# Patient Record
Sex: Female | Born: 1935 | Race: Black or African American | Hispanic: No | Marital: Married | Smoking: Never smoker
Health system: Southern US, Community
[De-identification: ages and names within clinical notes are randomized; demographics above are authoritative.]

## PROBLEM LIST (undated history)

## (undated) DIAGNOSIS — D126 Benign neoplasm of colon, unspecified: Secondary | ICD-10-CM

## (undated) DIAGNOSIS — I639 Cerebral infarction, unspecified: Secondary | ICD-10-CM

## (undated) DIAGNOSIS — F419 Anxiety disorder, unspecified: Secondary | ICD-10-CM

## (undated) DIAGNOSIS — I10 Essential (primary) hypertension: Secondary | ICD-10-CM

## (undated) DIAGNOSIS — E785 Hyperlipidemia, unspecified: Secondary | ICD-10-CM

## (undated) DIAGNOSIS — E119 Type 2 diabetes mellitus without complications: Secondary | ICD-10-CM

## (undated) DIAGNOSIS — K589 Irritable bowel syndrome without diarrhea: Secondary | ICD-10-CM

## (undated) DIAGNOSIS — J302 Other seasonal allergic rhinitis: Secondary | ICD-10-CM

## (undated) DIAGNOSIS — I484 Atypical atrial flutter: Secondary | ICD-10-CM

## (undated) DIAGNOSIS — H269 Unspecified cataract: Secondary | ICD-10-CM

## (undated) DIAGNOSIS — M797 Fibromyalgia: Secondary | ICD-10-CM

## (undated) DIAGNOSIS — I5032 Chronic diastolic (congestive) heart failure: Secondary | ICD-10-CM

## (undated) DIAGNOSIS — K219 Gastro-esophageal reflux disease without esophagitis: Secondary | ICD-10-CM

## (undated) DIAGNOSIS — Z9289 Personal history of other medical treatment: Secondary | ICD-10-CM

## (undated) DIAGNOSIS — M1990 Unspecified osteoarthritis, unspecified site: Secondary | ICD-10-CM

## (undated) HISTORY — DX: Cerebral infarction, unspecified: I63.9

## (undated) HISTORY — DX: Gastro-esophageal reflux disease without esophagitis: K21.9

## (undated) HISTORY — DX: Anxiety disorder, unspecified: F41.9

## (undated) HISTORY — DX: Unspecified osteoarthritis, unspecified site: M19.90

## (undated) HISTORY — PX: ANKLE FRACTURE SURGERY: SHX122

## (undated) HISTORY — DX: Irritable bowel syndrome, unspecified: K58.9

## (undated) HISTORY — PX: TUBAL LIGATION: SHX77

## (undated) HISTORY — DX: Chronic diastolic (congestive) heart failure: I50.32

## (undated) HISTORY — DX: Hyperlipidemia, unspecified: E78.5

## (undated) HISTORY — DX: Unspecified cataract: H26.9

## (undated) HISTORY — PX: CHOLECYSTECTOMY: SHX55

## (undated) HISTORY — DX: Fibromyalgia: M79.7

## (undated) HISTORY — DX: Benign neoplasm of colon, unspecified: D12.6

## (undated) HISTORY — PX: WRIST FRACTURE SURGERY: SHX121

---

## 1997-11-09 ENCOUNTER — Encounter: Admission: RE | Admit: 1997-11-09 | Discharge: 1997-11-09 | Payer: Self-pay | Admitting: Internal Medicine

## 1999-04-30 ENCOUNTER — Ambulatory Visit (HOSPITAL_COMMUNITY): Admission: RE | Admit: 1999-04-30 | Discharge: 1999-04-30 | Payer: Self-pay | Admitting: Gastroenterology

## 1999-04-30 ENCOUNTER — Encounter: Payer: Self-pay | Admitting: Gastroenterology

## 1999-05-20 ENCOUNTER — Encounter: Payer: Self-pay | Admitting: Gastroenterology

## 1999-05-20 ENCOUNTER — Ambulatory Visit (HOSPITAL_COMMUNITY): Admission: RE | Admit: 1999-05-20 | Discharge: 1999-05-20 | Payer: Self-pay | Admitting: Gastroenterology

## 1999-05-27 ENCOUNTER — Ambulatory Visit (HOSPITAL_COMMUNITY): Admission: RE | Admit: 1999-05-27 | Discharge: 1999-05-27 | Payer: Self-pay | Admitting: Gastroenterology

## 1999-05-27 ENCOUNTER — Encounter: Payer: Self-pay | Admitting: Gastroenterology

## 1999-06-27 ENCOUNTER — Ambulatory Visit (HOSPITAL_COMMUNITY): Admission: RE | Admit: 1999-06-27 | Discharge: 1999-06-27 | Payer: Self-pay | Admitting: Obstetrics and Gynecology

## 1999-06-27 ENCOUNTER — Encounter: Payer: Self-pay | Admitting: Obstetrics and Gynecology

## 2000-03-25 ENCOUNTER — Ambulatory Visit (HOSPITAL_COMMUNITY): Admission: RE | Admit: 2000-03-25 | Discharge: 2000-03-25 | Payer: Self-pay | Admitting: Gastroenterology

## 2000-03-25 ENCOUNTER — Encounter: Payer: Self-pay | Admitting: Gastroenterology

## 2000-04-07 ENCOUNTER — Encounter: Payer: Self-pay | Admitting: Gastroenterology

## 2000-04-07 ENCOUNTER — Ambulatory Visit (HOSPITAL_COMMUNITY): Admission: RE | Admit: 2000-04-07 | Discharge: 2000-04-07 | Payer: Self-pay | Admitting: Gastroenterology

## 2000-08-30 ENCOUNTER — Encounter: Payer: Self-pay | Admitting: Gastroenterology

## 2000-08-30 ENCOUNTER — Ambulatory Visit (HOSPITAL_COMMUNITY): Admission: RE | Admit: 2000-08-30 | Discharge: 2000-08-30 | Payer: Self-pay | Admitting: Gastroenterology

## 2000-10-08 ENCOUNTER — Ambulatory Visit (HOSPITAL_COMMUNITY): Admission: RE | Admit: 2000-10-08 | Discharge: 2000-10-09 | Payer: Self-pay | Admitting: Cardiology

## 2004-05-14 ENCOUNTER — Ambulatory Visit: Payer: Self-pay | Admitting: Gastroenterology

## 2004-06-19 ENCOUNTER — Ambulatory Visit: Payer: Self-pay | Admitting: Gastroenterology

## 2004-06-26 ENCOUNTER — Ambulatory Visit: Payer: Self-pay | Admitting: Gastroenterology

## 2004-06-26 ENCOUNTER — Ambulatory Visit (HOSPITAL_COMMUNITY): Admission: RE | Admit: 2004-06-26 | Discharge: 2004-06-26 | Payer: Self-pay | Admitting: Gastroenterology

## 2004-10-20 ENCOUNTER — Ambulatory Visit: Payer: Self-pay | Admitting: Internal Medicine

## 2004-12-18 ENCOUNTER — Ambulatory Visit: Payer: Self-pay | Admitting: Internal Medicine

## 2004-12-19 ENCOUNTER — Ambulatory Visit: Payer: Self-pay | Admitting: Internal Medicine

## 2004-12-22 ENCOUNTER — Ambulatory Visit: Payer: Self-pay | Admitting: Internal Medicine

## 2004-12-25 ENCOUNTER — Ambulatory Visit: Payer: Self-pay | Admitting: Internal Medicine

## 2004-12-29 ENCOUNTER — Ambulatory Visit: Payer: Self-pay | Admitting: Internal Medicine

## 2005-01-02 ENCOUNTER — Ambulatory Visit: Payer: Self-pay | Admitting: Internal Medicine

## 2005-01-05 ENCOUNTER — Ambulatory Visit: Payer: Self-pay | Admitting: Internal Medicine

## 2005-01-08 ENCOUNTER — Ambulatory Visit: Payer: Self-pay | Admitting: Internal Medicine

## 2005-01-12 ENCOUNTER — Ambulatory Visit: Payer: Self-pay | Admitting: Internal Medicine

## 2005-01-15 ENCOUNTER — Ambulatory Visit: Payer: Self-pay | Admitting: Internal Medicine

## 2005-01-19 ENCOUNTER — Ambulatory Visit: Payer: Self-pay | Admitting: Internal Medicine

## 2005-01-27 ENCOUNTER — Ambulatory Visit: Payer: Self-pay | Admitting: Internal Medicine

## 2005-02-05 ENCOUNTER — Ambulatory Visit: Payer: Self-pay | Admitting: Internal Medicine

## 2005-02-10 ENCOUNTER — Ambulatory Visit: Payer: Self-pay | Admitting: Internal Medicine

## 2005-04-16 ENCOUNTER — Ambulatory Visit: Payer: Self-pay | Admitting: Internal Medicine

## 2005-05-20 ENCOUNTER — Ambulatory Visit: Payer: Self-pay | Admitting: Gastroenterology

## 2005-05-25 ENCOUNTER — Ambulatory Visit (HOSPITAL_COMMUNITY): Admission: RE | Admit: 2005-05-25 | Discharge: 2005-05-25 | Payer: Self-pay | Admitting: Gastroenterology

## 2005-06-25 ENCOUNTER — Ambulatory Visit: Payer: Self-pay | Admitting: Gastroenterology

## 2005-08-21 ENCOUNTER — Ambulatory Visit: Payer: Self-pay | Admitting: Internal Medicine

## 2005-12-23 ENCOUNTER — Ambulatory Visit: Payer: Self-pay | Admitting: Gastroenterology

## 2006-03-25 ENCOUNTER — Ambulatory Visit: Payer: Self-pay | Admitting: Cardiology

## 2006-03-26 ENCOUNTER — Ambulatory Visit: Payer: Self-pay

## 2006-03-26 ENCOUNTER — Encounter: Payer: Self-pay | Admitting: Cardiology

## 2006-04-05 ENCOUNTER — Ambulatory Visit: Payer: Self-pay | Admitting: Internal Medicine

## 2006-07-14 ENCOUNTER — Ambulatory Visit: Payer: Self-pay | Admitting: Gastroenterology

## 2006-09-16 ENCOUNTER — Ambulatory Visit: Payer: Self-pay | Admitting: Internal Medicine

## 2006-09-17 ENCOUNTER — Ambulatory Visit: Payer: Self-pay | Admitting: Internal Medicine

## 2006-10-07 ENCOUNTER — Ambulatory Visit: Payer: Self-pay | Admitting: Cardiology

## 2006-11-01 ENCOUNTER — Ambulatory Visit: Payer: Self-pay | Admitting: Gastroenterology

## 2007-02-07 ENCOUNTER — Ambulatory Visit: Payer: Self-pay | Admitting: Gastroenterology

## 2007-02-07 LAB — CONVERTED CEMR LAB
ALT: 16 units/L (ref 0–35)
AST: 21 units/L (ref 0–37)
Albumin: 3.7 g/dL (ref 3.5–5.2)
Alkaline Phosphatase: 70 units/L (ref 39–117)
Amylase: 162 units/L — ABNORMAL HIGH (ref 27–131)
BUN: 12 mg/dL (ref 6–23)
Basophils Absolute: 0.3 10*3/uL — ABNORMAL HIGH (ref 0.0–0.1)
Basophils Relative: 3.4 % — ABNORMAL HIGH (ref 0.0–1.0)
Bilirubin, Direct: 0.1 mg/dL (ref 0.0–0.3)
CO2: 29 meq/L (ref 19–32)
Calcium: 9.1 mg/dL (ref 8.4–10.5)
Chloride: 107 meq/L (ref 96–112)
Creatinine, Ser: 0.9 mg/dL (ref 0.4–1.2)
Eosinophils Absolute: 0.3 10*3/uL (ref 0.0–0.6)
Eosinophils Relative: 3.2 % (ref 0.0–5.0)
Ferritin: 43.4 ng/mL (ref 10.0–291.0)
Folate: 6.4 ng/mL
GFR calc Af Amer: 80 mL/min
GFR calc non Af Amer: 66 mL/min
Glucose, Bld: 99 mg/dL (ref 70–99)
HCT: 35.3 % — ABNORMAL LOW (ref 36.0–46.0)
Hemoglobin: 11.9 g/dL — ABNORMAL LOW (ref 12.0–15.0)
Iron: 38 ug/dL — ABNORMAL LOW (ref 42–145)
Lipase: 39 units/L (ref 11.0–59.0)
Lymphocytes Relative: 42.2 % (ref 12.0–46.0)
MCHC: 33.7 g/dL (ref 30.0–36.0)
MCV: 83.4 fL (ref 78.0–100.0)
Monocytes Absolute: 0.6 10*3/uL (ref 0.2–0.7)
Monocytes Relative: 7.6 % (ref 3.0–11.0)
Neutro Abs: 3.6 10*3/uL (ref 1.4–7.7)
Neutrophils Relative %: 43.6 % (ref 43.0–77.0)
Platelets: 278 10*3/uL (ref 150–400)
Potassium: 4.3 meq/L (ref 3.5–5.1)
RBC: 4.23 M/uL (ref 3.87–5.11)
RDW: 14.4 % (ref 11.5–14.6)
Saturation Ratios: 12.7 % — ABNORMAL LOW (ref 20.0–50.0)
Sodium: 141 meq/L (ref 135–145)
TSH: 1.28 microintl units/mL (ref 0.35–5.50)
Total Bilirubin: 0.6 mg/dL (ref 0.3–1.2)
Total Protein: 6.9 g/dL (ref 6.0–8.3)
Transferrin: 213.8 mg/dL (ref >=212.0)
Vitamin B-12: 283 pg/mL (ref 211–911)
WBC: 8.3 10*3/uL (ref 4.5–10.5)

## 2007-02-18 ENCOUNTER — Ambulatory Visit: Payer: Self-pay | Admitting: Gastroenterology

## 2007-04-04 DIAGNOSIS — J45909 Unspecified asthma, uncomplicated: Secondary | ICD-10-CM | POA: Insufficient documentation

## 2007-04-04 DIAGNOSIS — K2100 Gastro-esophageal reflux disease with esophagitis, without bleeding: Secondary | ICD-10-CM | POA: Insufficient documentation

## 2007-04-04 DIAGNOSIS — J309 Allergic rhinitis, unspecified: Secondary | ICD-10-CM | POA: Insufficient documentation

## 2007-04-04 DIAGNOSIS — H6980 Other specified disorders of Eustachian tube, unspecified ear: Secondary | ICD-10-CM | POA: Insufficient documentation

## 2007-04-06 ENCOUNTER — Ambulatory Visit: Payer: Self-pay | Admitting: Internal Medicine

## 2007-05-06 ENCOUNTER — Ambulatory Visit: Payer: Self-pay | Admitting: Gastroenterology

## 2007-05-06 LAB — CONVERTED CEMR LAB
ALT: 18 units/L (ref 0–35)
AST: 24 units/L (ref 0–37)
Albumin: 3.8 g/dL (ref 3.5–5.2)
Alkaline Phosphatase: 74 units/L (ref 39–117)
BUN: 7 mg/dL (ref 6–23)
Basophils Absolute: 0 10*3/uL (ref 0.0–0.1)
Basophils Relative: 0.4 % (ref 0.0–1.0)
Bilirubin, Direct: 0.1 mg/dL (ref 0.0–0.3)
CA 125: 9.3 units/mL (ref 0.0–30.2)
CO2: 29 meq/L (ref 19–32)
Calcium: 9.6 mg/dL (ref 8.4–10.5)
Chloride: 107 meq/L (ref 96–112)
Creatinine, Ser: 0.8 mg/dL (ref 0.4–1.2)
Eosinophils Absolute: 0.2 10*3/uL (ref 0.0–0.6)
Eosinophils Relative: 2.6 % (ref 0.0–5.0)
Ferritin: 55 ng/mL (ref 10.0–291.0)
Folate: 10.5 ng/mL
GFR calc Af Amer: 91 mL/min
GFR calc non Af Amer: 75 mL/min
Glucose, Bld: 112 mg/dL — ABNORMAL HIGH (ref 70–99)
HCT: 38.6 % (ref 36.0–46.0)
Hemoglobin: 13.1 g/dL (ref 12.0–15.0)
Iron: 51 ug/dL (ref 42–145)
Lymphocytes Relative: 41.5 % (ref 12.0–46.0)
MCHC: 33.8 g/dL (ref 30.0–36.0)
MCV: 84.9 fL (ref 78.0–100.0)
Monocytes Absolute: 0.6 10*3/uL (ref 0.2–0.7)
Monocytes Relative: 7.8 % (ref 3.0–11.0)
Neutro Abs: 3.6 10*3/uL (ref 1.4–7.7)
Neutrophils Relative %: 47.7 % (ref 43.0–77.0)
Platelets: 276 10*3/uL (ref 150–400)
Potassium: 4.2 meq/L (ref 3.5–5.1)
RBC: 4.55 M/uL (ref 3.87–5.11)
RDW: 13.6 % (ref 11.5–14.6)
Saturation Ratios: 16.6 % — ABNORMAL LOW (ref 20.0–50.0)
Sed Rate: 18 mm/hr (ref 0–25)
Sodium: 141 meq/L (ref 135–145)
TSH: 1.86 microintl units/mL (ref 0.35–5.50)
Total Bilirubin: 0.6 mg/dL (ref 0.3–1.2)
Total Protein: 7.3 g/dL (ref 6.0–8.3)
Transferrin: 219.2 mg/dL (ref >=212.0)
Vitamin B-12: 263 pg/mL (ref 211–911)
WBC: 7.6 10*3/uL (ref 4.5–10.5)

## 2007-05-09 ENCOUNTER — Ambulatory Visit: Payer: Self-pay | Admitting: Cardiology

## 2007-06-04 ENCOUNTER — Emergency Department (HOSPITAL_COMMUNITY): Admission: EM | Admit: 2007-06-04 | Discharge: 2007-06-04 | Payer: Self-pay | Admitting: Emergency Medicine

## 2007-06-14 ENCOUNTER — Ambulatory Visit: Payer: Self-pay | Admitting: Gastroenterology

## 2007-09-05 ENCOUNTER — Ambulatory Visit: Payer: Self-pay | Admitting: Cardiology

## 2007-09-14 ENCOUNTER — Encounter: Payer: Self-pay | Admitting: Cardiology

## 2007-09-14 ENCOUNTER — Ambulatory Visit: Payer: Self-pay

## 2007-11-02 ENCOUNTER — Encounter: Payer: Self-pay | Admitting: Gastroenterology

## 2007-12-05 ENCOUNTER — Ambulatory Visit: Payer: Self-pay | Admitting: Internal Medicine

## 2007-12-12 ENCOUNTER — Encounter: Payer: Self-pay | Admitting: Internal Medicine

## 2007-12-28 ENCOUNTER — Ambulatory Visit: Payer: Self-pay | Admitting: Internal Medicine

## 2008-04-05 ENCOUNTER — Ambulatory Visit: Payer: Self-pay | Admitting: Internal Medicine

## 2008-04-20 ENCOUNTER — Telehealth: Payer: Self-pay | Admitting: Gastroenterology

## 2008-05-08 ENCOUNTER — Telehealth: Payer: Self-pay | Admitting: Internal Medicine

## 2008-05-22 ENCOUNTER — Ambulatory Visit: Payer: Self-pay | Admitting: Internal Medicine

## 2008-05-30 ENCOUNTER — Telehealth (INDEPENDENT_AMBULATORY_CARE_PROVIDER_SITE_OTHER): Payer: Self-pay | Admitting: *Deleted

## 2008-09-24 ENCOUNTER — Ambulatory Visit: Payer: Self-pay | Admitting: Internal Medicine

## 2009-02-15 ENCOUNTER — Encounter: Admission: RE | Admit: 2009-02-15 | Discharge: 2009-02-15 | Payer: Self-pay | Admitting: Obstetrics and Gynecology

## 2009-03-25 ENCOUNTER — Encounter: Payer: Self-pay | Admitting: Cardiology

## 2009-03-26 ENCOUNTER — Ambulatory Visit: Payer: Self-pay | Admitting: Internal Medicine

## 2009-07-03 DIAGNOSIS — I10 Essential (primary) hypertension: Secondary | ICD-10-CM | POA: Insufficient documentation

## 2009-07-03 DIAGNOSIS — E1122 Type 2 diabetes mellitus with diabetic chronic kidney disease: Secondary | ICD-10-CM | POA: Insufficient documentation

## 2009-07-03 DIAGNOSIS — K3184 Gastroparesis: Secondary | ICD-10-CM | POA: Insufficient documentation

## 2009-07-03 DIAGNOSIS — E782 Mixed hyperlipidemia: Secondary | ICD-10-CM | POA: Insufficient documentation

## 2009-07-03 DIAGNOSIS — Z87898 Personal history of other specified conditions: Secondary | ICD-10-CM | POA: Insufficient documentation

## 2009-07-03 DIAGNOSIS — K5731 Diverticulosis of large intestine without perforation or abscess with bleeding: Secondary | ICD-10-CM | POA: Insufficient documentation

## 2009-07-03 DIAGNOSIS — K589 Irritable bowel syndrome without diarrhea: Secondary | ICD-10-CM | POA: Insufficient documentation

## 2009-07-03 DIAGNOSIS — E785 Hyperlipidemia, unspecified: Secondary | ICD-10-CM

## 2009-07-03 DIAGNOSIS — K5900 Constipation, unspecified: Secondary | ICD-10-CM | POA: Insufficient documentation

## 2009-07-03 DIAGNOSIS — N183 Chronic kidney disease, stage 3 (moderate): Secondary | ICD-10-CM

## 2009-07-03 DIAGNOSIS — I080 Rheumatic disorders of both mitral and aortic valves: Secondary | ICD-10-CM | POA: Insufficient documentation

## 2009-07-03 DIAGNOSIS — M129 Arthropathy, unspecified: Secondary | ICD-10-CM | POA: Insufficient documentation

## 2009-07-04 ENCOUNTER — Ambulatory Visit: Payer: Self-pay | Admitting: Cardiology

## 2009-07-04 ENCOUNTER — Telehealth (INDEPENDENT_AMBULATORY_CARE_PROVIDER_SITE_OTHER): Payer: Self-pay | Admitting: *Deleted

## 2009-07-04 DIAGNOSIS — R209 Unspecified disturbances of skin sensation: Secondary | ICD-10-CM | POA: Insufficient documentation

## 2009-07-04 DIAGNOSIS — I2789 Other specified pulmonary heart diseases: Secondary | ICD-10-CM | POA: Insufficient documentation

## 2009-08-12 ENCOUNTER — Ambulatory Visit: Payer: Self-pay | Admitting: Cardiovascular Disease

## 2009-08-12 ENCOUNTER — Ambulatory Visit: Payer: Self-pay | Admitting: Cardiology

## 2009-08-12 ENCOUNTER — Ambulatory Visit: Payer: Self-pay

## 2009-08-12 ENCOUNTER — Ambulatory Visit (HOSPITAL_COMMUNITY): Admission: RE | Admit: 2009-08-12 | Discharge: 2009-08-12 | Payer: Self-pay | Admitting: Cardiology

## 2009-08-12 ENCOUNTER — Encounter: Payer: Self-pay | Admitting: Cardiology

## 2009-09-18 ENCOUNTER — Encounter: Payer: Self-pay | Admitting: Internal Medicine

## 2009-10-03 ENCOUNTER — Encounter: Payer: Self-pay | Admitting: Gastroenterology

## 2009-10-18 ENCOUNTER — Encounter: Admission: RE | Admit: 2009-10-18 | Discharge: 2009-10-18 | Payer: Self-pay | Admitting: Family Medicine

## 2009-11-06 ENCOUNTER — Ambulatory Visit: Payer: Self-pay | Admitting: Cardiology

## 2009-11-06 DIAGNOSIS — I5032 Chronic diastolic (congestive) heart failure: Secondary | ICD-10-CM | POA: Insufficient documentation

## 2010-02-18 ENCOUNTER — Encounter: Payer: Self-pay | Admitting: Cardiology

## 2010-02-25 ENCOUNTER — Ambulatory Visit: Payer: Self-pay | Admitting: Cardiology

## 2010-02-25 ENCOUNTER — Ambulatory Visit (HOSPITAL_COMMUNITY): Admission: RE | Admit: 2010-02-25 | Discharge: 2010-02-25 | Payer: Self-pay | Admitting: Cardiology

## 2010-02-25 ENCOUNTER — Encounter: Payer: Self-pay | Admitting: Cardiology

## 2010-02-25 ENCOUNTER — Ambulatory Visit: Payer: Self-pay

## 2010-03-03 ENCOUNTER — Encounter: Payer: Self-pay | Admitting: Cardiovascular Disease

## 2010-03-03 ENCOUNTER — Ambulatory Visit: Payer: Self-pay | Admitting: Cardiology

## 2010-03-03 ENCOUNTER — Encounter: Payer: Self-pay | Admitting: Cardiology

## 2010-03-10 ENCOUNTER — Ambulatory Visit: Payer: Self-pay | Admitting: Cardiology

## 2010-03-10 ENCOUNTER — Ambulatory Visit (HOSPITAL_COMMUNITY): Admission: RE | Admit: 2010-03-10 | Discharge: 2010-03-10 | Payer: Self-pay | Admitting: Cardiology

## 2010-03-25 ENCOUNTER — Ambulatory Visit: Payer: Self-pay | Admitting: Cardiology

## 2010-05-27 ENCOUNTER — Ambulatory Visit: Payer: Self-pay | Admitting: Cardiology

## 2010-06-23 ENCOUNTER — Telehealth: Payer: Self-pay | Admitting: Cardiology

## 2010-06-23 ENCOUNTER — Ambulatory Visit (HOSPITAL_COMMUNITY)
Admission: RE | Admit: 2010-06-23 | Discharge: 2010-06-23 | Payer: Self-pay | Source: Home / Self Care | Attending: Cardiology | Admitting: Cardiology

## 2010-06-30 LAB — GLUCOSE, CAPILLARY
Glucose-Capillary: 104 mg/dL — ABNORMAL HIGH (ref 70–99)
Glucose-Capillary: 106 mg/dL — ABNORMAL HIGH (ref 70–99)

## 2010-07-07 ENCOUNTER — Other Ambulatory Visit: Payer: Self-pay | Admitting: Cardiology

## 2010-07-07 ENCOUNTER — Ambulatory Visit
Admission: RE | Admit: 2010-07-07 | Discharge: 2010-07-07 | Payer: Self-pay | Source: Home / Self Care | Attending: Cardiology | Admitting: Cardiology

## 2010-07-07 ENCOUNTER — Encounter: Payer: Self-pay | Admitting: Cardiology

## 2010-07-07 LAB — MAGNESIUM: Magnesium: 1.8 mg/dL (ref 1.5–2.5)

## 2010-07-07 LAB — BASIC METABOLIC PANEL
BUN: 15 mg/dL (ref 6–23)
CO2: 28 mEq/L (ref 19–32)
Calcium: 9.6 mg/dL (ref 8.4–10.5)
Chloride: 105 mEq/L (ref 96–112)
Creatinine, Ser: 1 mg/dL (ref 0.4–1.2)
GFR: 72.13 mL/min (ref >60.00)
Glucose, Bld: 97 mg/dL (ref 70–99)
Potassium: 4.2 mEq/L (ref 3.5–5.1)
Sodium: 139 mEq/L (ref 135–145)

## 2010-07-13 LAB — CONVERTED CEMR LAB
BUN: 10 mg/dL (ref 6–23)
BUN: 14 mg/dL (ref 6–23)
Basophils Absolute: 0 10*3/uL (ref 0.0–0.1)
Basophils Relative: 0.4 % (ref 0.0–3.0)
CO2: 28 meq/L (ref 19–32)
CO2: 30 meq/L (ref 19–32)
Calcium: 10 mg/dL (ref 8.4–10.5)
Calcium: 9.9 mg/dL (ref 8.4–10.5)
Chloride: 103 meq/L (ref 96–112)
Chloride: 106 meq/L (ref 96–112)
Creatinine, Ser: 0.7 mg/dL (ref 0.4–1.2)
Creatinine, Ser: 1 mg/dL (ref 0.4–1.2)
Eosinophils Absolute: 0.5 10*3/uL (ref 0.0–0.7)
Eosinophils Relative: 5 % (ref 0.0–5.0)
GFR calc non Af Amer: 100.32 mL/min (ref >60)
GFR calc non Af Amer: 69.71 mL/min (ref >60)
Glucose, Bld: 103 mg/dL — ABNORMAL HIGH (ref 70–99)
Glucose, Bld: 120 mg/dL — ABNORMAL HIGH (ref 70–99)
HCT: 37.7 % (ref 36.0–46.0)
Hemoglobin: 12.6 g/dL (ref 12.0–15.0)
INR: 1 (ref 0.8–1.0)
Lymphocytes Relative: 40 % (ref 12.0–46.0)
Lymphs Abs: 4 10*3/uL (ref 0.7–4.0)
MCHC: 33.5 g/dL (ref 30.0–36.0)
MCV: 83.9 fL (ref 78.0–100.0)
Monocytes Absolute: 0.8 10*3/uL (ref 0.1–1.0)
Monocytes Relative: 8.1 % (ref 3.0–12.0)
Neutro Abs: 4.7 10*3/uL (ref 1.4–7.7)
Neutrophils Relative %: 46.5 % (ref 43.0–77.0)
Platelets: 271 10*3/uL (ref 150.0–400.0)
Potassium: 4 meq/L (ref 3.5–5.1)
Potassium: 4.4 meq/L (ref 3.5–5.1)
Prothrombin Time: 10.8 s (ref 9.7–11.8)
RBC: 4.5 M/uL (ref 3.87–5.11)
RDW: 15.7 % — ABNORMAL HIGH (ref 11.5–14.6)
Sodium: 140 meq/L (ref 135–145)
Sodium: 144 meq/L (ref 135–145)
WBC: 10 10*3/uL (ref 4.5–10.5)

## 2010-07-17 NOTE — Assessment & Plan Note (Signed)
Summary: 6 MONTHS/ MBW   Primary Provider/Referring Provider:  Dr. Fuller Mandril  CC:  6 month follow up visit-nasal drainage/chest tightness/congestion.Marland Kitchen  History of Present Illness:  04/05/08- Saw Dr. Annalee Genta in June:" Eustachian dysfunction, allergic rhinitis, reflux". She stayed in during ragweed season. Nasal congestion.Added Flonase.  Reviewed PFT 12/28/07- FEV1 2.06/109%, FEV1/FVC 0.87 with minimal response to BD. DLCO-74%.  05/22/08- allergic rhinitis, asthma, eustachian dysfunction For skin testing today, off antihistamines complains ears and skin itch.  Skin Test: Mild POS reactions to grass, weed and tree pollens  09/23/08-  allergic rhinitis, asthma, eustachian dysfunction Not feeling well- blames pollen for itching of eyes and nose, with itching throat interfering with singing in choir. Denies headache, fever, sore throat, swollen glands and wheeze. C/O tired, fatigued, chest congested,  coughing thick white but blowing yellow from nose.  March 26, 2009-  Allergic rhinitis, asthma, eustachian dysfunction Blames the Fall pollen for 1 month of nasal drainage, chest tightness, congestion. We discussed the seasonal pattern of exacerbation each Spring and Fall. Cough and nasal drainage yellow. Not controlled by Claritin. Denies fever, purulent. Feels pressure- maxillary. Ears itch.  No pneumovax in past 10 years.    Current Medications (verified): 1)  Lipitor 20 Mg  Tabs (Atorvastatin Calcium) .Marland Kitchen.. 1 By Mouth Daily 2)  Diovan Hct 160-12.5 Mg  Tabs (Valsartan-Hydrochlorothiazide) .Marland Kitchen.. 1 By Mouth Daily 3)  Amitiza 24 Mcg  Caps (Lubiprostone) .... Bid 4)  Fluticasone Propionate 50 Mcg/act Susp (Fluticasone Propionate) .... 2 Puffs Each Nostril Daily 5)  Advair Diskus 100-50 Mcg/dose  Misc (Fluticasone-Salmeterol) .Marland Kitchen.. 1 Puff Two Times A Day 6)  Nexium 40 Mg  Cpdr (Esomeprazole Magnesium) .Marland Kitchen.. 1 Capsule Each Day 30 Minutes Before Meal 7)  Bayer Aspirin Ec Low Dose 81 Mg  Tbec  (Aspirin) .Marland Kitchen.. 1 By Mouth Daily 8)  Loratadine 10 Mg Tabs (Loratadine) .Marland Kitchen.. 1 Daily As Needed- Antihistamine  Allergies (verified): 1)  ! Pcn 2)  ! Cipro 3)  ! * Eggs 4)  Amoxicillin (Amoxicillin)  Past History:  Past Medical History: Last updated: 05/22/2008 Allergic rhinitis - Skin test 05/22/08 Asthma Constipation GERD Esophageal stricture Irritable Bowel Syndrome Diverticulosis MIgraines Diabetes Gastroparesis  Past Surgical History: Last updated: 09/24/2008 Cholecystectomy lens implant left eye  Family History: Last updated: 12/09/07 Mother deceased age 68 of stroke. Father deceased age 2 of stroke. 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 No ETOH use.  Risk Factors: Smoking Status: quit (04/04/2007)  Review of Systems      See HPI       The patient complains of shortness of breath with activity, shortness of breath at rest, productive cough, and non-productive cough.  The patient denies coughing up blood, chest pain, irregular heartbeats, acid heartburn, indigestion, loss of appetite, weight change, abdominal pain, difficulty swallowing, sore throat, tooth/dental problems, headaches, nasal congestion/difficulty breathing through nose, and sneezing.    Vital Signs:  Patient profile:   77 year old female Height:      62.5 inches Weight:      158.13 pounds BMI:     28.56 O2 Sat:      97 % on Room air Pulse rate:   86 / minute BP sitting:   142 / 64  (right arm) Cuff size:   regular  Vitals Entered By: Reynaldo Minium CMA (March 26, 2009 10:10 AM)  O2 Flow:  Room air  Physical Exam  Additional Exam:  General: A/Ox3; pleasant  and cooperative, NAD, tired appearing SKIN: no rash, lesions NODES: no lymphadenopathy HEENT: [place]/AT, EOM- WNL, Conjuctivae- clear, PERRLA, TM-WNL, Nose- clear, Throat-reddened, no drainage, melampatti II NECK: Supple w/ fair ROM, JVD-  none, normal carotid impulses w/o bruits Thyroid-  CHEST: Clear to P&A HEART: RRR, no m/g/r heard ABDOMEN: Soft and nl;  ZOX:WRUE, nl pulses, no edema  NEURO: Grossly intact to observation      Impression & Recommendations:  Problem # 1:  ALLERGIC RHINITIS (ICD-477.9)  Seasonal exacerbation. We can treat for today with neb and depo.We discussed restarting allergy shots- she is interested in doing this saying she felt better on shots in the past. Her updated medication list for this problem includes:    Fluticasone Propionate 50 Mcg/act Susp (Fluticasone propionate) .Marland Kitchen... 2 puffs each nostril daily    Loratadine 10 Mg Tabs (Loratadine) .Marland Kitchen... 1 daily as needed- antihistamine  Problem # 2:  ASTHMA (ICD-493.90) She is well controlled and will continue with Advair.  Other Orders: Est. Patient Level III (45409) Admin of Therapeutic Inj  intramuscular or subcutaneous (81191) Depo- Medrol 80mg  (J1040) Nebulizer Tx (47829) Pneumococcal Vaccine (56213) Admin 1st Vaccine (08657)  Patient Instructions: 1)  Please schedule a follow-up appointment in 2 months. 2)  We will call you when your vaccine is ready to start allergy shots. 3)  Neb neo nasal 4)  depo 80 5)  Pneumovax Prescriptions: FLUTICASONE PROPIONATE 50 MCG/ACT SUSP (FLUTICASONE PROPIONATE) 2 puffs each nostril daily  #1 x prn   Entered and Authorized by:   Waymon Budge MD   Signed by:   Waymon Budge MD on 03/26/2009   Method used:   Electronically to        [facility] (retail)       [address]       [place], [place]  [address]       Ph: [phone] or [phone]       Fax: [phone]   RxID:   [id number] ADVAIR DISKUS 100-50 MCG/DOSE  MISC (FLUTICASONE-SALMETEROL) 1 puff two times a day  #1 x prn   Entered and Authorized by:   Waymon Budge MD   Signed by:   Waymon Budge MD on 03/26/2009   Method used:   Electronically to        [facility]  (retail)       [address]       [place], [place]  [address]       Ph: [phone] or [phone]       Fax: [phone]   RxID:   [id number]    Immunization History:  Influenza Immunization History:    Influenza:  historical (03/08/2009)  Immunizations Administered:  Pneumonia Vaccine:    Vaccine Type: Pneumovax (Medicare)    Site: left deltoid    Mfr: Merck    Dose: 0.5 ml    Route: IM    Given by: Reynaldo Minium CMA    Exp. Date: 05/30/2010    Lot #: [id number]    VIS given: 01/11/96 version given March 26, 2009.    Medication Administration  Injection # 1:    Medication: Depo- Medrol 80mg     Diagnosis: ALLERGIC RHINITIS (ICD-477.9)    Route: IM    Site: RUOQ gluteus    Exp Date: 11/2011    Lot #: [id number]    Mfr: Pharmacia  Patient tolerated injection without complications    Given by: Reynaldo Minium CMA (March 26, 2009 11:17 AM)  Medication # 1:    Medication: EMR miscellaneous medications    Diagnosis: ALLERGIC RHINITIS (ICD-477.9)    Dose: 3 drops    Route: intranasal    Exp Date: 11/2009    Lot #: [id number]    Mfr: Bayer    Comments: Neo-Synephrine    Patient tolerated medication without complications    Given by: Reynaldo Minium CMA (March 26, 2009 11:17 AM)  Orders Added: 1)  Est. Patient Level III [64403] 2)  Admin of Therapeutic Inj  intramuscular or subcutaneous [96372] 3)  Depo- Medrol 80mg  [J1040] 4)  Nebulizer Tx [47425] 5)  Pneumococcal Vaccine [90732] 6)  Admin 1st Vaccine [95638]

## 2010-07-17 NOTE — Assessment & Plan Note (Signed)
Summary: BLOATED...AS.   History of Present Illness Visit Type: Follow-up Visit Primary GI MD: Sheryn Bison MD FACP FAGA Primary Provider: Fuller Mandril, MD Chief Complaint: c/o bloating,generalized abdominal pain, nausea and acid reflux History of Present Illness:   This patient was not seen today since she has had all her records transferred to Dr.  Ranae Palms within the last year and has undergone gastroenterology evaluation by this physician and is under her care. Please send this note to Dr. Electa Sniff for his records.   GI Review of Systems    Reports acid reflux, bloating, and  chest pain.     Location of  Abdominal pain: generalized.    Denies abdominal pain, belching, dysphagia with liquids, dysphagia with solids, heartburn, loss of appetite, nausea, vomiting, vomiting blood, weight loss, and  weight gain.        Denies anal fissure, black tarry stools, change in bowel habit, constipation, diarrhea, diverticulosis, fecal incontinence, heme positive stool, hemorrhoids, irritable bowel syndrome, jaundice, light color stool, liver problems, rectal bleeding, and  rectal pain.    Current Medications (verified): 1)  Diovan Hct 160-12.5 Mg  Tabs (Valsartan-Hydrochlorothiazide) .Marland Kitchen.. 1 By Mouth Daily 2)  Fluticasone Propionate 50 Mcg/act Susp (Fluticasone Propionate) .... 2 Puffs Each Nostril Daily 3)  Advair Diskus 100-50 Mcg/dose  Misc (Fluticasone-Salmeterol) .Marland Kitchen.. 1 Puff Two Times A Day 4)  Bayer Aspirin Ec Low Dose 81 Mg  Tbec (Aspirin) .Marland Kitchen.. 1 By Mouth Daily 5)  Lipitor 40 Mg Tabs (Atorvastatin Calcium) .... Take One Tablet By Mouth Daily. 6)  Zyrtec Allergy 10 Mg Tabs (Cetirizine Hcl) .... Every Other Day 7)  Dexilant 60 Mg Cpdr (Dexlansoprazole) .... Take 1 Capsule By Mouth Once A Day 8)  Lyrica 75 Mg Caps (Pregabalin) .... Take 1 Capsule By Mouth Two Times A Day 9)  Meclizine Hcl 25 Mg Tabs (Meclizine Hcl) .Marland Kitchen.. 1 By Mouth Q 4 Hours As Needed 10)  Tylenol Extra Strength 500 Mg  Tabs (Acetaminophen) .Marland Kitchen.. 1 By Mouth As Needed 11)  Fish Oil 1200 Mg Caps (Omega-3 Fatty Acids) .... 2 By Mouth Every Morning 12)  Vitamin C Cr 500 Mg Cr-Tabs (Ascorbic Acid) .Marland Kitchen.. 1 By Mouth Once Daily 13)  Align  Caps (Probiotic Product) .Marland Kitchen.. 1 Capsule By Mouth Once Daily 14)  Lantus 100 Unit/ml Soln (Insulin Glargine) .... 30 Units Every Morning  Allergies (verified): 1)  ! Pcn 2)  ! Cipro 3)  ! * Eggs 4)  Amoxicillin (Amoxicillin)  Past History:  Past Medical History: Reviewed history from 07/03/2009 and no changes required. Current Problems:  MITRAL REGURGITATION, MILD (ICD-396.3) HYPERTENSION, UNSPECIFIED (ICD-401.9) HYPERLIPIDEMIA (ICD-272.4) ESOPHAGITIS, REFLUX (ICD-530.11) DIABETES MELLITUS, TYPE II (ICD-250.00) EUSTACHIAN TUBE DYSFUNCTION (ICD-381.81) ASTHMA (ICD-493.90) ALLERGIC RHINITIS (ICD-477.9) ARTHRITIS (ICD-716.90) IBS (ICD-564.1) MIGRAINES, HX OF (ICD-V13.8) DIVERTICULOSIS, COLON, WITH HEMORRHAGE (ICD-562.12) GASTROPARESIS (ICD-536.3) CONSTIPATION (ICD-564.00)  Past Surgical History: Reviewed history from 07/03/2009 and no changes required. Cholecystectomy lens implant left eye Tubal ligation wrist surgery ankle surgery  Family History: Reviewed history from 07/03/2009 and no changes required. Mother deceased age 53 of stroke. Father deceased age 13 of stroke. Stroke--sister Diabetes--brother No FH of Colon Cancer:  Social History: Reviewed history from 07/03/2009 and no changes required. 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 No ETOH use. Illicit Drug Use - no Drug Use:  no  Vital Signs:  Patient profile:   77 year old female Height:      62.5 inches Weight:      158.25 pounds BMI:  28.59 Pulse rate:   68 / minute Pulse rhythm:   irregular BP sitting:   128 / 68  (left arm)  Vitals Entered By: Milford Cage NCMA (October 03, 2009 11:17 AM)

## 2010-07-17 NOTE — Assessment & Plan Note (Signed)
Summary: f35m   Visit Type:  Follow-up Primary Provider:  Fuller Mandril, MD  CC:  no complaints.  History of Present Illness: Her breathing is pretty good.  She is able to get out and do quite a bit.  She is able to walk, and works in her flower beds.   She does not feel limited working in her flower bed.  When she gets tired she sits down and rest.  Vacuuming (wants to get a robot) causes her back to her hurt.    Current Problems (verified): 1)  Chest Pain Unspecified  (ICD-786.50) 2)  Chest Tightness-pressure-other  ([id number]) 3)  Chronic Diastolic Heart Failure  (ICD-428.32) 4)  Pulmonary Hypertension, Secondary  (ICD-416.8) 5)  Arm Numbness  (ICD-782.0) 6)  Mitral Regurgitation, Mild  (ICD-396.3) 7)  Hypertension, Unspecified  (ICD-401.9) 8)  Hyperlipidemia  (ICD-272.4) 9)  Esophagitis, Reflux  (ICD-530.11) 10)  Diabetes Mellitus, Type II  (ICD-250.00) 11)  Eustachian Tube Dysfunction  (ICD-381.81) 12)  Asthma  (ICD-493.90) 13)  Allergic Rhinitis  (ICD-477.9) 14)  Arthritis  (ICD-716.90) 15)  Ibs  (ICD-564.1) 16)  Migraines, Hx of  (ICD-V13.8) 17)  Diverticulosis, Colon, With Hemorrhage  (ICD-562.12) 18)  Gastroparesis  (ICD-536.3) 19)  Constipation  (ICD-564.00)  Current Medications (verified): 1)  Diovan Hct 160-12.5 Mg  Tabs (Valsartan-Hydrochlorothiazide) .... Take 1/2 Tablet Daily 2)  Fluticasone Propionate 50 Mcg/act Susp (Fluticasone Propionate) .... 2 Puffs Each Nostril Daily 3)  Advair Diskus 100-50 Mcg/dose  Misc (Fluticasone-Salmeterol) .Marland Kitchen.. 1 Puff Two Times A Day 4)  Bayer Aspirin Ec Low Dose 81 Mg  Tbec (Aspirin) .Marland Kitchen.. 1 By Mouth Daily 5)  Lipitor 40 Mg Tabs (Atorvastatin Calcium) .... Take One Tablet By Mouth Daily. 6)  Zyrtec Allergy 10 Mg Tabs (Cetirizine Hcl) .... Every Other Day 7)  Dexilant 60 Mg Cpdr (Dexlansoprazole) .... Take 1 Capsule By Mouth Once A Day 8)  Lyrica 75 Mg Caps (Pregabalin) .... Take 1 Capsule By Mouth Two Times A Day 9)  Meclizine  Hcl 25 Mg Tabs (Meclizine Hcl) .Marland Kitchen.. 1 By Mouth Q 4 Hours As Needed 10)  Tylenol Extra Strength 500 Mg Tabs (Acetaminophen) .Marland Kitchen.. 1 By Mouth As Needed 11)  Fish Oil 1200 Mg Caps (Omega-3 Fatty Acids) .... 2 By Mouth Every Morning 12)  Vitamin C Cr 500 Mg Cr-Tabs (Ascorbic Acid) .Marland Kitchen.. 1 By Mouth Once Daily 13)  Align  Caps (Probiotic Product) .Marland Kitchen.. 1 Capsule By Mouth Once Daily 14)  Lantus 100 Unit/ml Soln (Insulin Glargine) .... 30 Units Every Morning 15)  Methylprednisolone 4 Mg Tabs (Methylprednisolone) .... Uad 16)  Sucralfate .Marland Kitchen.. 1 By Mouth Qid  Allergies (verified): 1)  ! Pcn 2)  ! Cipro 3)  ! * Eggs 4)  Amoxicillin (Amoxicillin)  Past History:  Past Medical History: Last updated: 07/03/2009 Current Problems:  MITRAL REGURGITATION, MILD (ICD-396.3) HYPERTENSION, UNSPECIFIED (ICD-401.9) HYPERLIPIDEMIA (ICD-272.4) ESOPHAGITIS, REFLUX (ICD-530.11) DIABETES MELLITUS, TYPE II (ICD-250.00) EUSTACHIAN TUBE DYSFUNCTION (ICD-381.81) ASTHMA (ICD-493.90) ALLERGIC RHINITIS (ICD-477.9) ARTHRITIS (ICD-716.90) IBS (ICD-564.1) MIGRAINES, HX OF (ICD-V13.8) DIVERTICULOSIS, COLON, WITH HEMORRHAGE (ICD-562.12) GASTROPARESIS (ICD-536.3) CONSTIPATION (ICD-564.00)  Vital Signs:  Patient profile:   77 year old female Height:      62.5 inches Weight:      157 pounds BMI:     28.36 Pulse rate:   62 / minute Resp:     16 per minute BP sitting:   136 / 67  (right arm)  Vitals Entered By: Marrion Coy, CNA (May 27, 2010 9:46 AM)  Physical Exam  General:  Well developed, well nourished, in no acute distress. Head:  normocephalic and atraumatic Eyes:  PERRLA/EOM intact; conjunctiva and lids normal. Lungs:  Clear bilaterally to auscultation and percussion. Heart:  Normal S1 and S2.  1-2/6 apical murmur. Abdomen:  Bowel sounds positive; abdomen soft and non-tender without masses, organomegaly, or hernias noted. No hepatosplenomegaly. Pulses:  pulses normal in all 4  extremities Extremities:  No clubbing or cyanosis. Neurologic:  Alert and oriented x 3.   Echocardiogram  Procedure date:  02/25/2010  Findings:       Study Conclusions    - Left ventricle: The cavity size was normal. Wall thickness was     increased in a pattern of mild LVH. Systolic function was normal.     The estimated ejection fraction was in the range of 60% to 65%.     Wall motion was normal; there were no regional wall motion     abnormalities. Features are consistent with a pseudonormal left     ventricular filling pattern, with concomitant abnormal relaxation     and increased filling pressure (grade 2 diastolic dysfunction).     E/medial e' > 15, suggesting LV end diastolic pressure at least 20     mmHg.   - Aortic valve: There was no stenosis.   - Mitral valve: Moderate regurgitation.   - Left atrium: The atrium was moderately dilated.   - Right ventricle: The cavity size was normal. Systolic function was     normal.   - Tricuspid valve: Peak RV-RA gradient:76mm Hg (S).   - Pulmonary arteries: PA peak pressure: 56mm Hg (S).   - Inferior vena cava: The vessel was normal in size; the     respirophasic diameter changes were in the normal range (= 50%);     findings are consistent with normal central venous pressure.   Impressions:    - Normal LV size with mild LV hypertrophy. EF 60-65% with moderate     diastolic dysfunction. Normal RV size and systolic function. PA     systolic pressure 56 mmHg (moderate pulmonary hypertension).     Patient has evidence for elevated left atrial pressure, but PA     systolic pressure elevation may be out of proportion to this.  Cardiac Cath  Procedure date:  03/11/2010  Findings:       HEMODYNAMIC DATA:   1. Right atrium 10.   2. RV 54/10.   3. Pulmonary 56/22, mean 34.   4. Pulmonary capillary wedge 24.   5. LV 174/22.   6. Aortic 161/71, mean 107.   7. No aortic gradient on pullback.   8. Aortic saturation 95%.   9.  Pulmonary artery saturation 66%.   10.Superior vena cava saturation 62%.   11.Thermodilution cardiac output 4.23 liters per minute.   12.Thermodilution cardiac index 2.38 liters per minute per meter       squared.   13.Fick cardiac output 4.19 meters per minute.   14.Fick cardiac index 2.35 liters per minute per meter squared.      Cardiac Cath  Procedure date:  03/11/2010  Findings:       CONCLUSION:   1. Well-preserved overall left ventricular function.   2. Mild to no more than moderate mitral regurgitation.   3. Calcified coronary arteries without significant high-grade focal       obstruction.      DISPOSITION:  Focus on the patient's hypertension will be the main focal   point at this point in  time.  TEE might be helpful, but both by echo and   cath, it does not appear to be severe and focus on blood pressure which   seemed to be the best choice at this point in time.      Impression & Recommendations:  Problem # 1:  CHRONIC DIASTOLIC HEART FAILURE (ICD-428.32) Weather this is primary or secondary is unclear.  May be related to HBP.  However, with MR feel we should do TEE to assess valvular architecture.  LVEDP is high and there is pulmonary hypertension which is chronic.   RV not enlarged.  There is moderate MR.  All of this is chronic, and she has been cathed previously and followed by Dr. Diona Browner.  TEE explained to patient and she says she has had endo in the past.  Risks of procedure covered with her.  She understands reasoning for study.  Her updated medication list for this problem includes:    Diovan Hct 160-12.5 Mg Tabs (Valsartan-hydrochlorothiazide) .Marland Kitchen... Take 1/2 tablet daily    Bayer Aspirin Ec Low Dose 81 Mg Tbec (Aspirin) .Marland Kitchen... 1 by mouth daily  Problem # 2:  PULMONARY HYPERTENSION, SECONDARY (ICD-416.8) Question secondary to HTN or MR.  Will assess with TEE.  Problem # 3:  MITRAL REGURGITATION, MILD (ICD-396.3)  see above  Orders: Trans Esophageal  Echocardiogram (TEE)  Problem # 4:  HYPERTENSION, UNSPECIFIED (ICD-401.9) better control.   Her updated medication list for this problem includes:    Diovan Hct 160-12.5 Mg Tabs (Valsartan-hydrochlorothiazide) .Marland Kitchen... Take 1/2 tablet daily    Bayer Aspirin Ec Low Dose 81 Mg Tbec (Aspirin) .Marland Kitchen... 1 by mouth daily  Problem # 5:  HYPERLIPIDEMIA (ICD-272.4) on lipid lowering treatment.  Her updated medication list for this problem includes:    Lipitor 40 Mg Tabs (Atorvastatin calcium) .Marland Kitchen... Take one tablet by mouth daily.  Patient Instructions: 1)  Your physician has requested that you have a TEE.  During a TEE, sound waves are used to create images of your heart. It provides your doctor with information about the size and shape of your heart and how well your heart's chambers and valves are working. In this test, a transducer is attached to the end of a flexible tube that's guided down your throat and into your esophagus (the tube leading from your mouth to your stomach) to get a more detailed image of your heart. You are not awake for the procedure. Please see the instruction sheet given to you today.  For further information please visit [email or web address] 2)  Your physician recommends that you schedule a follow-up appointment in: 6 WEEKS

## 2010-07-17 NOTE — Cardiovascular Report (Signed)
Summary: Cath/Percutaneous Orders   Cath/Percutaneous Orders   Imported By: Roderic Ovens 03/14/2010 09:19:17  _____________________________________________________________________  External Attachment:    Type:   Image     Comment:   External Document

## 2010-07-17 NOTE — Assessment & Plan Note (Signed)
Summary: ROV   Visit Type:  Follow-up Primary Provider:  Fuller Mandril, MD  CC:  Post-cath.  History of Present Illness:  Patient checks her BP two to three times per week.  It has not been high at all.  Patient has not been having chest pain.  She gets a little shortness of breath, but is working on some weight loss in the interim.  I reviewed her cath information with her in detail, including the images today in the office.  She wants to get out and walk more, and she has been doing so.    Current Medications (verified): 1)  Diovan Hct 160-12.5 Mg  Tabs (Valsartan-Hydrochlorothiazide) .... Take 1/2 Tablet Daily 2)  Fluticasone Propionate 50 Mcg/act Susp (Fluticasone Propionate) .... 2 Puffs Each Nostril Daily 3)  Advair Diskus 100-50 Mcg/dose  Misc (Fluticasone-Salmeterol) .Marland Kitchen.. 1 Puff Two Times A Day 4)  Bayer Aspirin Ec Low Dose 81 Mg  Tbec (Aspirin) .Marland Kitchen.. 1 By Mouth Daily 5)  Lipitor 40 Mg Tabs (Atorvastatin Calcium) .... Take One Tablet By Mouth Daily. 6)  Zyrtec Allergy 10 Mg Tabs (Cetirizine Hcl) .... Every Other Day 7)  Dexilant 60 Mg Cpdr (Dexlansoprazole) .... Take 1 Capsule By Mouth Once A Day 8)  Lyrica 75 Mg Caps (Pregabalin) .... Take 1 Capsule By Mouth Two Times A Day 9)  Meclizine Hcl 25 Mg Tabs (Meclizine Hcl) .Marland Kitchen.. 1 By Mouth Q 4 Hours As Needed 10)  Tylenol Extra Strength 500 Mg Tabs (Acetaminophen) .Marland Kitchen.. 1 By Mouth As Needed 11)  Fish Oil 1200 Mg Caps (Omega-3 Fatty Acids) .... 2 By Mouth Every Morning 12)  Vitamin C Cr 500 Mg Cr-Tabs (Ascorbic Acid) .Marland Kitchen.. 1 By Mouth Once Daily 13)  Align  Caps (Probiotic Product) .Marland Kitchen.. 1 Capsule By Mouth Once Daily 14)  Lantus 100 Unit/ml Soln (Insulin Glargine) .... 30 Units Every Morning  Allergies: 1)  ! Pcn 2)  ! Cipro 3)  ! * Eggs 4)  Amoxicillin (Amoxicillin)  Vital Signs:  Patient profile:   77 year old female Height:      62.5 inches Weight:      162.25 pounds BMI:     29.31 Pulse rate:   64 / minute Pulse rhythm:    regular Resp:     18 per minute BP sitting:   136 / 71  (left arm) Cuff size:   large  Vitals Entered By: Vikki Ports (March 25, 2010 11:38 AM)  Physical Exam  General:  Well developed, well nourished, in no acute distress. Head:  normocephalic and atraumatic Eyes:  PERRLA/EOM intact; conjunctiva and lids normal. Lungs:  Clear bilaterally to auscultation and percussion. Heart:  PMI non displaced.  Normal S1 and S2.  Soft apcial murmur. Pulses:  pulses normal in all 4 extremities Extremities:  No clubbing or cyanosis. Neurologic:  Alert and oriented x 3.   EKG  Procedure date:  03/25/2010  Findings:      NSR. LVH with repolarization changes.    CXR  Procedure date:  03/03/2010  Findings:      IMPRESSION:   1.  Chronic lung changes as above with no acute pulmonary findings. 2.  Heart mildly enlarged without congestive heart failure. 3.  Scoliosis.   Read By:  Bernerd Limbo,  [name]     Released By:  Bernerd Limbo,  [name]   Cardiac Cath  Procedure date:  03/10/2010  Findings:      IMPRESSION:   1.  Chronic lung changes  as above with no acute pulmonary findings. 2.  Heart mildly enlarged without congestive heart failure. 3.  Scoliosis.   Read By:  Bernerd Limbo,  [name]     Released By:  Bernerd Limbo,  [name]   Cardiac Cath  Procedure date:  03/10/2010  Findings:      HEMODYNAMIC DATA: 1. Right atrium 10. 2. RV 54/10. 3. Pulmonary 56/22, mean 34. 4. Pulmonary capillary wedge 24. 5. LV 174/22. 6. Aortic 161/71, mean 107. 7. No aortic gradient on pullback. 8. Aortic saturation 95%. 9. Pulmonary artery saturation 66%. 10.Superior vena cava saturation 62%. 11.Thermodilution cardiac output 4.23 liters per minute. 12.Thermodilution cardiac index 2.38 liters per minute per meter     squared. 13.Fick cardiac output 4.19 meters per minute. 14.Fick cardiac index 2.35 liters per minute per meter squared.   ANGIOGRAPHIC DATA: 1. Ventriculography  was performed in the RAO and LAO projections.     There was some ectopy, but the overall LV systolic size appeared to     be normal.  Overall ejection fraction was vigorous with EF in     excess of 60%.  There was mild mitral regurgitation to no more than     moderate noted.  It would be classified as 1 to no more than 2+.     In the LAO projection, similar findings were noted. 2. On plain fluoroscopy, there is some moderate calcification of the     proximal coronary arteries.  Impression & Recommendations:  Problem # 1:  CHRONIC DIASTOLIC HEART FAILURE (ICD-428.32)  stable at this point.  Her updated medication list for this problem includes:    Diovan Hct 160-12.5 Mg Tabs (Valsartan-hydrochlorothiazide) .Marland Kitchen... Take 1/2 tablet daily    Bayer Aspirin Ec Low Dose 81 Mg Tbec (Aspirin) .Marland Kitchen... 1 by mouth daily  Her updated medication list for this problem includes:    Diovan Hct 160-12.5 Mg Tabs (Valsartan-hydrochlorothiazide) .Marland Kitchen... Take 1/2 tablet daily    Bayer Aspirin Ec Low Dose 81 Mg Tbec (Aspirin) .Marland Kitchen... 1 by mouth daily  Orders: EKG w/ Interpretation (93000)  Problem # 2:  PULMONARY HYPERTENSION, SECONDARY (ICD-416.8) Cath data suggests that findings likely secondary to chronic diastolic dysfunction.  Will need to control diet, and BP carefully.  Reviewed cath findings in detail. These were present previously as well.   Problem # 3:  MITRAL REGURGITATION, MILD (ICD-396.3) See results. Orders: EKG w/ Interpretation ([address]000)  Patient Instructions: 1)  Your physician recommends that you schedule a follow-up appointment in: 2 MONTHS 2)  Your physician recommends that you continue on your current medications as directed. Please refer to the Current Medication list given to you today.

## 2010-07-17 NOTE — Procedures (Signed)
Summary: Gastroenterology endo  Gastroenterology endo   Imported By: Donneta Romberg 09/20/2007 11:22:32  _____________________________________________________________________  External Attachment:    Type:   Image     Comment:   External Document

## 2010-07-17 NOTE — Procedures (Signed)
Summary: Gastroenterology colon  Gastroenterology colon   Imported By: Donneta Romberg 09/20/2007 11:23:39  _____________________________________________________________________  External Attachment:    Type:   Image     Comment:   External Document

## 2010-07-17 NOTE — Miscellaneous (Signed)
Summary: RX Prior auth Amitiza  and change PPI  Clinical Lists Changes  Medications: Changed medication from PREVACID SOLUTAB 30 MG TBDP (LANSOPRAZOLE) Take 1 tablet by mouth once a day to NEXIUM 40 MG  CPDR (ESOMEPRAZOLE MAGNESIUM) 1 capsule each day 30 minutes before meal - Signed Rx of NEXIUM 40 MG  CPDR (ESOMEPRAZOLE MAGNESIUM) 1 capsule each day 30 minutes before meal;  #30 x 12;  Signed;  Entered by: Harlow Mares CMA;  Authorized by: Mardella Layman MD FACG,FAGA;  Method used: Electronic  Amitiza 24 micrograms one by mouth two times a day with meals has been approved by phone for 3 years, pharm and pt advised. pharm says copay os six dollars.  Prescriptions: NEXIUM 40 MG  CPDR (ESOMEPRAZOLE MAGNESIUM) 1 capsule each day 30 minutes before meal  #30 x 12   Entered by:   Harlow Mares CMA   Authorized by:   Mardella Layman MD FACG,FAGA   Signed by:   Harlow Mares CMA on 11/02/2007   Method used:   Electronically sent to ...       [facility]*       [address]       [place], [place]  [address]       Ph: [phone] or [phone]       Fax: [phone]   RxID:   [id number]

## 2010-07-17 NOTE — Assessment & Plan Note (Signed)
Summary: follow up/ mbw   Visit Type:  Follow-up PCP:  Dr. Fuller Mandril  Chief Complaint:  Pt here for follow-up.  Pt c/o sinus pressure.  Meds reviewed..  History of Present Illness: Current Problems:  ESOPHAGITIS, REFLUX (ICD-530.11) DM (ICD-250.00) EUSTACHIAN TUBE DYSFUNCTION (ICD-381.81) ASTHMA (ICD-493.90) ALLERGIC RHINITIS (ICD-81.75)  77 year old woman returning for follow-up of allergic rhinitis and asthma.  Complains of sinus pressure. C/o tinnnitus, asks ENT, lost singing voice.gets episodes of itching in her ears.  Has been treated with antibiotics and told she has fluid in her ears but the antibiotics don't help.  Her primary physician did chest x-ray reported clear and she denies any lower respiratory discomfort, including cough, wheeze, chest tightness, or phlegm.  She sings iin choirs and complains that her throat seems to close.  She gets out of breath too easily while singing. Denies headache, sinus drainage, sneezing chest pain, dyspnea, n/v/d, weight loss, fever, edema.         Current Allergies: ! PCN ! CIPRO ! * EGGS AMOXICILLIN (AMOXICILLIN) AMOXICILLIN (AMOXICILLIN) PENICILLIN G POTASSIUM (PENICILLIN G POTASSIUM) PENICILLIN G POTASSIUM (PENICILLIN G POTASSIUM)  Past Medical History:    Reviewed history from 09/20/2007 and no changes required:       Allergic rhinitis       Asthma       Constipation       GERD       Esophageal stricture       Irritable Bowel Syndrome       Diverticulosis       MIgraines       Diabetes       Gastroparesis   Family History:    Mother deceased age 24 of stroke.    Father deceased age 25 of stroke.    Stroke--sister    Diabetes--brother  Social History:    Reviewed history and no changes required:       Pt is a housewife.       Never smoked.       Pt has 8 children.       No ETOH use.    Review of Systems      See HPI   Vital Signs:  Patient Profile:   77 Years Old Female Weight:      154.6  pounds O2 Sat:      98 % O2 treatment:    Room Air Pulse rate:   74 / minute BP sitting:   110 / 68  (left arm) Cuff size:   regular  Vitals Entered By: Michel Bickers CMA (December 05, 2007 9:33 AM)                 Physical Exam  GENERAL:  A/Ox3; pleasant & cooperative.NAD HEENT:  [place]/AT, EOM-wnl, PERRLA, EACs-clear, TMs-wnl, NOSE-clear, small amount of white mucus on left, THROAT-clear & wnl. NECK:  Supple w/ fair ROM; no JVD; normal carotid impulses w/o bruits; no thyromegaly or nodules palpated; no lymphadenopathy. CHEST: Clear to P&A HEART:  RRR, no m/r/g  heard ABDOMEN:  Soft & nt; nml bowel sounds; no organomegaly or masses detected. EXT: Warm bilat,  no calf pain, edema, clubbing, pulses intact Skin: no rash/lesion       Problem # 1:  ASTHMA (ICD-493.90) Will get PFT, but she seems to be feeling well now. Her updated medication list for this problem includes:    Advair Diskus 100-50 Mcg/dose Misc (Fluticasone-salmeterol) .Marland Kitchen... 1 puff two times a day   Problem # 2:  ALLERGIC RHINITIS (ICD-477.9) she is having some rhinitis, eustachian dysfunction, and laryngitis.  She is also complaining of tinnitus.we will arrange ENT referral. Her updated medication list for this problem includes:    Nasonex 50 Mcg/act Susp (Mometasone furoate) ..... Once daily each nostril   Problem # 3:  EUSTACHIAN TUBE DYSFUNCTION (ICD-381.81) Ears look good but c/o persistent discomfort and tinnitus- will ask ENT Orders: Est. Patient Level III ([address]) ENT Referral (ENT)   Medications Added to Medication List This Visit: 1)  Lipitor 20 Mg Tabs (Atorvastatin calcium) .Marland Kitchen.. 1 by mouth daily 2)  Diovan Hct 160-12.5 Mg Tabs (Valsartan-hydrochlorothiazide) .Marland Kitchen.. 1 by mouth daily 3)  Nasonex 50 Mcg/act Susp (Mometasone furoate) .... Once daily each nostril 4)  Advair Diskus 100-50 Mcg/dose Misc (Fluticasone-salmeterol) .Marland Kitchen.. 1 puff two times a day 5)  Bayer Aspirin Ec Low Dose 81 Mg Tbec (Aspirin)  .Marland Kitchen.. 1 by mouth daily   Patient Instructions: 1)  Please schedule a follow-up appointment in 2 months. 2)  See [facility] to schedule ENT visit 3)  Schedule PFT   ]

## 2010-07-17 NOTE — Assessment & Plan Note (Signed)
Summary: FOLLOW UP/ MBW   PCP:  Dr. Fuller Mandril  Chief Complaint:  follow up visit- PFT results.  History of Present Illness: Current Problems:  ESOPHAGITIS, REFLUX (ICD-530.11) DM (ICD-250.00) EUSTACHIAN TUBE DYSFUNCTION (ICD-381.81) ASTHMA (ICD-493.90) ALLERGIC RHINITIS (ICD-79.82)   77 year old woman returning for follow-up of allergic rhinitis and asthma.  Complains of sinus pressure. C/o tinnnitus, asks ENT, lost singing voice.gets episodes of itching in her ears.  Has been treated with antibiotics and told she has fluid in her ears but the antibiotics don't help.  Her primary physician did chest x-ray reported clear and she denies any lower respiratory discomfort, including cough, wheeze, chest tightness, or phlegm.  She sings iin choirs and complains that her throat seems to close.  She gets out of breath too easily while singing.  04/05/08- Saw Dr. Annalee Genta in June:" Eustachian dysfunction, allergic rhinitis, reflux". She stayed in during ragweed season. Nasal congestion.Added Flonase.  Reviewed PFT 12/28/07- FEV1 2.06/109%, FEV1/FVC 0.87 with minimal response to BD. DLCO-74%.         Prior Medication List:  LIPITOR 20 MG  TABS (ATORVASTATIN CALCIUM) 1 by mouth daily DIOVAN HCT 160-12.5 MG  TABS (VALSARTAN-HYDROCHLOROTHIAZIDE) 1 by mouth daily AMITIZA 24 MCG  CAPS (LUBIPROSTONE) BID NASONEX 50 MCG/ACT  SUSP (MOMETASONE FUROATE) once daily each nostril ADVAIR DISKUS 100-50 MCG/DOSE  MISC (FLUTICASONE-SALMETEROL) 1 puff two times a day NEXIUM 40 MG  CPDR (ESOMEPRAZOLE MAGNESIUM) 1 capsule each day 30 minutes before meal BAYER ASPIRIN EC LOW DOSE 81 MG  TBEC (ASPIRIN) 1 by mouth daily   Updated Prior Medication List: LIPITOR 20 MG  TABS (ATORVASTATIN CALCIUM) 1 by mouth daily DIOVAN HCT 160-12.5 MG  TABS (VALSARTAN-HYDROCHLOROTHIAZIDE) 1 by mouth daily AMITIZA 24 MCG  CAPS (LUBIPROSTONE) BID NASONEX 50 MCG/ACT  SUSP (MOMETASONE FUROATE) once daily each nostril ADVAIR  DISKUS 100-50 MCG/DOSE  MISC (FLUTICASONE-SALMETEROL) 1 puff two times a day NEXIUM 40 MG  CPDR (ESOMEPRAZOLE MAGNESIUM) 1 capsule each day 30 minutes before meal BAYER ASPIRIN EC LOW DOSE 81 MG  TBEC (ASPIRIN) 1 by mouth daily  Current Allergies (reviewed today): ! PCN ! CIPRO ! * EGGS AMOXICILLIN (AMOXICILLIN)  Past Medical History:    Reviewed history from 09/20/2007 and no changes required:       Allergic rhinitis       Asthma       Constipation       GERD       Esophageal stricture       Irritable Bowel Syndrome       Diverticulosis       MIgraines       Diabetes       Gastroparesis   Social History:    Reviewed history from 12/05/2007 and no changes required:       Pt is a housewife.       Never smoked.       Pt has 8 children.       No ETOH use.    Review of Systems       Denies headache, sinus drainage, sneezing chest pain, dyspnea, n/v/d, weight loss, fever, edema.     Vital Signs:  Patient Profile:   77 Years Old Female Weight:      150.50 pounds O2 Sat:      99 % O2 treatment:    Room Air Pulse rate:   65 / minute BP sitting:   118 / 58  (left arm) Cuff size:   regular  Vitals Entered By:  Reynaldo Minium CMA (April 05, 2008 9:54 AM)             Comments Medications reviewed with patient Reynaldo Minium CMA  April 05, 2008 9:56 AM      Physical Exam  General: A/Ox3; pleasant and cooperative, NAD, SKIN: no rash, lesions NODES: no lymphadenopathy HEENT: [place]/AT, EOM- WNL, Conjuctivae- clear, PERRLA, TM-WNL, Nose- clear, Throat- clear and wnl, dentures NECK: Supple w/ fair ROM, JVD- none, normal carotid impulses w/o bruits Thyroid- normal to palpation CHEST: Clear to P&A HEART: RRR, no m/g/r heard ABDOMEN: Soft and nl; nml bowel sounds; no organomegaly or masses noted QQV:ZDGL, nl pulses, no edema  NEURO: Grossly intact to observation         Problem # 1:  ALLERGIC RHINITIS (ICD-477.9) Rhinitis and intermittent eustachian  dysfunction. Flonase is appropriate and we discussed medication options. Her updated medication list for this problem includes:    Fluticasone Propionate 50 Mcg/act Susp (Fluticasone propionate) .Marland Kitchen... 2 puffs each nostril daily   Problem # 2:  ASTHMA (ICD-493.90) Mild intermittent. Watch off Advair since she has run out. Her updated medication list for this problem includes:    Advair Diskus 100-50 Mcg/dose Misc (Fluticasone-salmeterol) .Marland Kitchen... 1 puff two times a day   Medications Added to Medication List This Visit: 1)  Fluticasone Propionate 50 Mcg/act Susp (Fluticasone propionate) .... 2 puffs each nostril daily   Patient Instructions: 1)  Please schedule a follow-up appointment in 6 months. 2)  Flu vax 3)  Watch to see how your breathing and chest feel off Advair. If you get wheezy or congested and can't clear up we can restart it.   Prescriptions: FLUTICASONE PROPIONATE 50 MCG/ACT SUSP (FLUTICASONE PROPIONATE) 2 puffs each nostril daily  #1 x prn   Entered and Authorized by:   Waymon Budge MD   Signed by:   Waymon Budge MD on 04/05/2008   Method used:   Historical   RxID:   [id number]  ]

## 2010-07-17 NOTE — Miscellaneous (Signed)
Summary: Orders Update pft charges  Clinical Lists Changes  Orders: Added new Service order of Carbon Monoxide diffusing w/capacity (94720) - Signed Added new Service order of Lung Volumes (94240) - Signed Added new Service order of Spirometry (Pre & Post) (94060) - Signed 

## 2010-07-17 NOTE — Miscellaneous (Signed)
Summary: Ventolin HFA Rx/kcw  Clinical Lists Changes  Medications: Added new medication of VENTOLIN HFA 108 (90 BASE) MCG/ACT AERS (ALBUTEROL SULFATE) 2 puffs four times a day as needed - Signed Rx of VENTOLIN HFA 108 (90 BASE) MCG/ACT AERS (ALBUTEROL SULFATE) 2 puffs four times a day as needed;  #1 x 11;  Signed;  Entered by: Reynaldo Minium CMA;  Authorized by: Waymon Budge MD;  Method used: Electronically to [facility] [phone]*, [address] [place], [place], [place]  [address], Ph: [phone] or [phone], Fax: [phone]    Prescriptions: VENTOLIN HFA 108 (90 BASE) MCG/ACT AERS (ALBUTEROL SULFATE) 2 puffs four times a day as needed  #1 x 11   Entered by:   Reynaldo Minium CMA   Authorized by:   Waymon Budge MD   Signed by:   Reynaldo Minium CMA on 09/18/2009   Method used:   Electronically to        [facility]* (retail)       [address]       [place], [place]  [address]       Ph: [phone] or [phone]       Fax: [phone]   RxID:   [id number]

## 2010-07-17 NOTE — Letter (Signed)
Summary: TEE Instructions  [facility], Main Office  [address]   [place], [place] [address]   Phone: [phone]  Fax: [phone]      TEE Instructions  05/27/2010 MRN: [id number]  Kerry Barnes [address] [place], [place]  [address]      You are scheduled for a TEE on Monday June 23, 2010 with Dr. Jens Som.  Please arrive at the [facility] at 12:00 Noon on the day of your procedure.  1)   Diet:     A)   May have clear liquid breakfast unitl 7:00 AM.  Clear liquids include:         water, broth, Sprite, Ginger Ale, black cofee, tea (no sugar), cranberry      / grape /apple juice, jello (not red), popsicle from clear juices (not red).  2)  Must have a responsible person to drive you home.  3)   Bring your current insurance cards and current list of all your medications.  4)      Do not take Lantus the morning of procedure.    *Special Note:  Every effort is made to have your procedure done on time.  Occasionally there are emergencies that present themselves at the hospital that may cause delays.  Please be patient if a delay does occur.  *If you have any questions after you get home, please call the office at [phone].

## 2010-07-17 NOTE — Progress Notes (Signed)
Summary: sample  Phone Note Call from Patient Call back at Home Phone [phone]   Caller: Patient Call For: patterson Reason for Call: Talk to Nurse Summary of Call: pt would like to know if she can get samples of align Initial call taken by: Tawni Levy,  April 20, 2008 12:30 PM  Follow-up for Phone Call        samples of align up front, Left message on patients machine that she can pick them. Follow-up by: Harlow Mares CMA,  April 20, 2008 1:31 PM

## 2010-07-17 NOTE — Assessment & Plan Note (Signed)
Summary: 3 month rov/sl   Visit Type:  Follow-up Primary Provider:  Fuller Mandril, MD  CC:  sweating and edema LE.  History of Present Illness: Doing pretty well.  Bothered by her stomach.  Still has alot of gas, and had a cat scan of the abdomen.  Some swelling in LE, but only when she wears socks.   Has had long standing HTN.  Has moderate shortness of breath with activity.  BP at home have been reasonably controlled.  Denies any chest pain.    Current Medications (verified): 1)  Diovan Hct 160-12.5 Mg  Tabs (Valsartan-Hydrochlorothiazide) .Marland Kitchen.. 1 By Mouth Daily 2)  Fluticasone Propionate 50 Mcg/act Susp (Fluticasone Propionate) .... 2 Puffs Each Nostril Daily 3)  Advair Diskus 100-50 Mcg/dose  Misc (Fluticasone-Salmeterol) .Marland Kitchen.. 1 Puff Two Times A Day 4)  Bayer Aspirin Ec Low Dose 81 Mg  Tbec (Aspirin) .Marland Kitchen.. 1 By Mouth Daily 5)  Lipitor 40 Mg Tabs (Atorvastatin Calcium) .... Take One Tablet By Mouth Daily. 6)  Zyrtec Allergy 10 Mg Tabs (Cetirizine Hcl) .... Every Other Day 7)  Dexilant 60 Mg Cpdr (Dexlansoprazole) .... Take 1 Capsule By Mouth Once A Day 8)  Lyrica 75 Mg Caps (Pregabalin) .... Take 1 Capsule By Mouth Two Times A Day 9)  Meclizine Hcl 25 Mg Tabs (Meclizine Hcl) .Marland Kitchen.. 1 By Mouth Q 4 Hours As Needed 10)  Tylenol Extra Strength 500 Mg Tabs (Acetaminophen) .Marland Kitchen.. 1 By Mouth As Needed 11)  Fish Oil 1200 Mg Caps (Omega-3 Fatty Acids) .... 2 By Mouth Every Morning 12)  Vitamin C Cr 500 Mg Cr-Tabs (Ascorbic Acid) .Marland Kitchen.. 1 By Mouth Once Daily 13)  Align  Caps (Probiotic Product) .Marland Kitchen.. 1 Capsule By Mouth Once Daily 14)  Lantus 100 Unit/ml Soln (Insulin Glargine) .... 30 Units Every Morning  Allergies (verified): 1)  ! Pcn 2)  ! Cipro 3)  ! * Eggs 4)  Amoxicillin (Amoxicillin)  Past History:  Past Medical History: Last updated: 07/03/2009 Current Problems:  MITRAL REGURGITATION, MILD (ICD-396.3) HYPERTENSION, UNSPECIFIED (ICD-401.9) HYPERLIPIDEMIA (ICD-272.4) ESOPHAGITIS,  REFLUX (ICD-530.11) DIABETES MELLITUS, TYPE II (ICD-250.00) EUSTACHIAN TUBE DYSFUNCTION (ICD-381.81) ASTHMA (ICD-493.90) ALLERGIC RHINITIS (ICD-477.9) ARTHRITIS (ICD-716.90) IBS (ICD-564.1) MIGRAINES, HX OF (ICD-V13.8) DIVERTICULOSIS, COLON, WITH HEMORRHAGE (ICD-562.12) GASTROPARESIS (ICD-536.3) CONSTIPATION (ICD-564.00)  Vital Signs:  Patient profile:   77 year old female Height:      62.5 inches Weight:      161 pounds BMI:     29.08 Pulse rate:   68 / minute Resp:     16 per minute BP sitting:   136 / 68  (left arm)  Vitals Entered By: Marrion Coy, CNA (Nov 06, 2009 9:34 AM)  Physical Exam  General:  Well developed, well nourished, in no acute distress. Head:  normocephalic and atraumatic Eyes:  PERRLA/EOM intact; conjunctiva and lids normal. Neck:  JVP flat. Lungs:  Clear bilaterally to auscultation and percussion. Heart:  PMI non displaced.  Normal S1 and S2.  I cannot appreciate a significant MR murmur.  No diastolic murmur.   Abdomen:  Bowel sounds positive; abdomen soft and non-tender without masses, organomegaly, or hernias noted. No hepatosplenomegaly. Extremities:  No clubbing or cyanosis.  No significant edema. Neurologic:  Alert and oriented x 3.   EKG  Procedure date:  11/06/2009  Findings:      NSR.  LVH with repolarization abnormality.   Cardiac Cath  Procedure date:  10/08/2000  Findings:      CONCLUSIONS: 1. Mitral regurgitation. 2. Pulmonary hypertension with  elevated wedge, probably on the basis of    mitral regurgitation. 3. No significant coronary obstruction.  DISPOSITION:  The patient may need TEE.  We will keep her overnight and plan to discharge her in the morning.  She will likely need mitral valve repair. TEE prior to this would probably be warranted.  Impression & Recommendations:  Problem # 1:  MITRAL REGURGITATION, MILD (ICD-396.3) Has pulm HTN dating back to 2002 cath.  Repeat echoes have shown only mild MR, and also no  def murmur.  Nothing to suggest sleep apnea.  Pulm HTN could be from diastolic heart failure.  Suggest repeat echo in about six months, and consider TEE, alhtough likelihood of leading to surgery not very high.  ECG suggest LVH with repolarization abnormalities, progressive over the years, ? cause.  Problem # 2:  PULMONARY HYPERTENSION, SECONDARY (ICD-416.8) see discussion above. Orders: EKG w/ Interpretation (93000) TLB-BMP (Basic Metabolic Panel-BMET) (80048-METABOL)  Problem # 3:  HYPERTENSION, UNSPECIFIED (ICD-401.9) controlled. Her updated medication list for this problem includes:    Diovan Hct 160-12.5 Mg Tabs (Valsartan-hydrochlorothiazide) .Marland Kitchen... 1 by mouth daily    Bayer Aspirin Ec Low Dose 81 Mg Tbec (Aspirin) .Marland Kitchen... 1 by mouth daily  Problem # 4:  DIABETES MELLITUS, TYPE II (ICD-250.00) on medication. Her updated medication list for this problem includes:    Diovan Hct 160-12.5 Mg Tabs (Valsartan-hydrochlorothiazide) .Marland Kitchen... 1 by mouth daily    Bayer Aspirin Ec Low Dose 81 Mg Tbec (Aspirin) .Marland Kitchen... 1 by mouth daily    Lantus 100 Unit/ml Soln (Insulin glargine) .Marland KitchenMarland KitchenMarland KitchenMarland Kitchen 30 units every morning  Problem # 5:  CHRONIC DIASTOLIC HEART FAILURE (ICD-428.32) See above.  Likely secondary to HTN, DM, etc.  ?component of MR as primary or secondary.  See old cath and echo reports.  Her updated medication list for this problem includes:    Diovan Hct 160-12.5 Mg Tabs (Valsartan-hydrochlorothiazide) .Marland Kitchen... 1 by mouth daily    Bayer Aspirin Ec Low Dose 81 Mg Tbec (Aspirin) .Marland Kitchen... 1 by mouth daily  Patient Instructions: 1)  Your physician recommends that you return for lab work in: bmet today...we will call you with results 2)  Your physician recommends that you schedule a follow-up appointment in: 4 months

## 2010-07-17 NOTE — Assessment & Plan Note (Signed)
Summary: ROV   Visit Type:  Follow-up Primary Provider:  Fuller Mandril, MD  CC:  Chest discomfort funny feeling.  History of Present Illness: Patient is doing well.  Last Monday she had an A1c, and she had a low HDL.  She has had multiple labs done at Dr.  Rosezetta Schlatter office.  She has been eating flaxseed meal, and using on cereal.  She has had two episodes of chest pressure twice.   It lasted about a few seconds.  She did not have SOB, or diaphoresis.  It was sudden.    Current Medications (verified): 1)  Diovan Hct 160-12.5 Mg  Tabs (Valsartan-Hydrochlorothiazide) .Marland Kitchen.. 1 By Mouth Daily 2)  Fluticasone Propionate 50 Mcg/act Susp (Fluticasone Propionate) .... 2 Puffs Each Nostril Daily 3)  Advair Diskus 100-50 Mcg/dose  Misc (Fluticasone-Salmeterol) .Marland Kitchen.. 1 Puff Two Times A Day 4)  Bayer Aspirin Ec Low Dose 81 Mg  Tbec (Aspirin) .Marland Kitchen.. 1 By Mouth Daily 5)  Lipitor 40 Mg Tabs (Atorvastatin Calcium) .... Take One Tablet By Mouth Daily. 6)  Zyrtec Allergy 10 Mg Tabs (Cetirizine Hcl) .... Every Other Day 7)  Dexilant 60 Mg Cpdr (Dexlansoprazole) .... Take 1 Capsule By Mouth Once A Day 8)  Lyrica 75 Mg Caps (Pregabalin) .... Take 1 Capsule By Mouth Two Times A Day 9)  Meclizine Hcl 25 Mg Tabs (Meclizine Hcl) .Marland Kitchen.. 1 By Mouth Q 4 Hours As Needed 10)  Tylenol Extra Strength 500 Mg Tabs (Acetaminophen) .Marland Kitchen.. 1 By Mouth As Needed 11)  Fish Oil 1200 Mg Caps (Omega-3 Fatty Acids) .... 2 By Mouth Every Morning 12)  Vitamin C Cr 500 Mg Cr-Tabs (Ascorbic Acid) .Marland Kitchen.. 1 By Mouth Once Daily 13)  Align  Caps (Probiotic Product) .Marland Kitchen.. 1 Capsule By Mouth Once Daily 14)  Lantus 100 Unit/ml Soln (Insulin Glargine) .... 30 Units Every Morning  Allergies: 1)  ! Pcn 2)  ! Cipro 3)  ! * Eggs 4)  Amoxicillin (Amoxicillin)  Past History:  Past Medical History: Last updated: 07/03/2009 Current Problems:  MITRAL REGURGITATION, MILD (ICD-396.3) HYPERTENSION, UNSPECIFIED (ICD-401.9) HYPERLIPIDEMIA  (ICD-272.4) ESOPHAGITIS, REFLUX (ICD-530.11) DIABETES MELLITUS, TYPE II (ICD-250.00) EUSTACHIAN TUBE DYSFUNCTION (ICD-381.81) ASTHMA (ICD-493.90) ALLERGIC RHINITIS (ICD-477.9) ARTHRITIS (ICD-716.90) IBS (ICD-564.1) MIGRAINES, HX OF (ICD-V13.8) DIVERTICULOSIS, COLON, WITH HEMORRHAGE (ICD-562.12) GASTROPARESIS (ICD-536.3) CONSTIPATION (ICD-564.00)  Past Surgical History: Last updated: 07/03/2009 Cholecystectomy lens implant left eye Tubal ligation wrist surgery ankle surgery  Family History: Last updated: 16-Oct-2009 Mother deceased age 35 of stroke. Father deceased age 36 of stroke. 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 No ETOH use. Illicit Drug Use - no  Vital Signs:  Patient profile:   77 year old female Height:      62.5 inches Weight:      160.75 pounds BMI:     29.04 Pulse rate:   61 / minute Pulse rhythm:   regular Resp:     18 per minute BP sitting:   124 / 62  (left arm) Cuff size:   large  Vitals Entered By: Vikki Ports (February 25, 2010 9:10 AM)  Physical Exam  General:  Well developed, well nourished, in no acute distress. Head:  normocephalic and atraumatic Eyes:  PERRLA/EOM intact; conjunctiva and lids normal. Lungs:  Clear bilaterally to auscultation and percussion. Heart:  PMI non displaced.  Normal S1 and S2.  Minimal SEM.  No DM.  Cannot appreciate a typical MR murmur. Msk:  Back normal, normal gait. Muscle strength and tone normal. Pulses:  pulses normal in all 4 extremities Extremities:  No clubbing or cyanosis.  Trace edema/ Neurologic:  Alert and oriented x 3.   EKG  Procedure date:  02/25/2010  Findings:      NSR.  LVH with repole abnormalities  Impression & Recommendations:  Problem # 1:  CHEST TIGHTNESS-PRESSURE-OTHER ([id number])  Pain is very brief, not clearly exertional, and no associated symptoms.  She  had GXT in Jan, and neg myoview in 2009.  Not clear episode.  ECG shows LVH with mostly repole changes, in setting of high LVEDP, and assumed diastolic changes.  Will repeat echo, have her walk, see her back in a week.  If more pain, favor more agressive evaluation.  Discussed at length.  Orders: EKG w/ Interpretation (93000) Echocardiogram (Echo)  Problem # 2:  CHRONIC DIASTOLIC HEART FAILURE (ICD-428.32)  See prior cath.  Prob from long standing, previously uncontrolled HTN.  Her updated medication list for this problem includes:    Diovan Hct 160-12.5 Mg Tabs (Valsartan-hydrochlorothiazide) .Marland Kitchen... 1 by mouth daily    Bayer Aspirin Ec Low Dose 81 Mg Tbec (Aspirin) .Marland Kitchen... 1 by mouth daily  Orders: EKG w/ Interpretation (93000) Echocardiogram (Echo)  Problem # 3:  MITRAL REGURGITATION, MILD (ICD-396.3) last assessment.  Recheck echo.    Problem # 4:  HYPERTENSION, UNSPECIFIED (ICD-401.9) currently well controlled. Her updated medication list for this problem includes:    Diovan Hct 160-12.5 Mg Tabs (Valsartan-hydrochlorothiazide) .Marland Kitchen... 1 by mouth daily    Bayer Aspirin Ec Low Dose 81 Mg Tbec (Aspirin) .Marland Kitchen... 1 by mouth daily  Problem # 5:  HYPERLIPIDEMIA (ICD-272.4) might be candidate for CTEP inhibition trial with low HDL.  Her updated medication list for this problem includes:    Lipitor 40 Mg Tabs (Atorvastatin calcium) .Marland Kitchen... Take one tablet by mouth daily.  Patient Instructions: 1)  Your physician recommends that you schedule a follow-up appointment in: 1 WEEK 2)  Your physician recommends that you continue on your current medications as directed. Please refer to the Current Medication list given to you today. 3)  Your physician has requested that you have an echocardiogram in 1 WEEK.  Echocardiography is a painless test that uses sound waves to create images of your heart. It provides your doctor with information about the size and shape of your heart and how well your heart's  chambers and valves are working.  This procedure takes approximately one hour. There are no restrictions for this procedure.

## 2010-07-17 NOTE — Progress Notes (Signed)
Summary: rx on advair  Phone Note Call from Patient Call back at Home Phone [phone]   Caller: Patient Call For: young Reason for Call: Refill Medication Summary of Call: patient says dr young gave her advair and she went to the pharmacy to get another. the pharamcy told her to give [place] a call to get a rx for it. he pharmcay is [facility] [address] phone number [phone] Initial call taken by: Valinda Hoar,  May 30, 2008 1:04 PM  Follow-up for Phone Call        Spoke with pt.  She was taken of off advair at last ov per Dr. Maple Hudson and was advised that if she noticed wheezing or congestion that we can refill for her again.  She states that a wk or so after d/c med she started to notice wheezing and some congestion.  I will send rx to her pharm. Follow-up by: Vernie Murders,  May 30, 2008 1:48 PM      Prescriptions: ADVAIR DISKUS 100-50 MCG/DOSE  MISC (FLUTICASONE-SALMETEROL) 1 puff two times a day  #1 x 6   Entered by:   Vernie Murders   Authorized by:   Waymon Budge MD   Signed by:   Vernie Murders on 05/30/2008   Method used:   Electronically to        [facility] (retail)       [address]       [place], [place]  [address]       Ph: [phone] or [phone]       Fax: [phone]   RxID:   [id number]

## 2010-07-17 NOTE — Assessment & Plan Note (Signed)
Summary: f1w/f/u echo/cp/lwb   Visit Type:  1 week follow up Primary Provider:  Fuller Mandril, MD  CC:  Patient still having chest discomfort.  History of Present Illness: She tends to hurt in the chest, radiating up.  She is sometimes mildly short of breath.  She has not been able to walk.  She is walking, but not long enough.  SHe does get a sour taste in her mouth.  The Dexilant has not helped.  SHe is sure this is from her stomach.    Current Medications (verified): 1)  Diovan Hct 160-12.5 Mg  Tabs (Valsartan-Hydrochlorothiazide) .... Take 1/2 Tablet Daily 2)  Fluticasone Propionate 50 Mcg/act Susp (Fluticasone Propionate) .... 2 Puffs Each Nostril Daily 3)  Advair Diskus 100-50 Mcg/dose  Misc (Fluticasone-Salmeterol) .Marland Kitchen.. 1 Puff Two Times A Day 4)  Bayer Aspirin Ec Low Dose 81 Mg  Tbec (Aspirin) .Marland Kitchen.. 1 By Mouth Daily 5)  Lipitor 40 Mg Tabs (Atorvastatin Calcium) .... Take One Tablet By Mouth Daily. 6)  Zyrtec Allergy 10 Mg Tabs (Cetirizine Hcl) .... Every Other Day 7)  Dexilant 60 Mg Cpdr (Dexlansoprazole) .... Take 1 Capsule By Mouth Once A Day 8)  Lyrica 75 Mg Caps (Pregabalin) .... Take 1 Capsule By Mouth Two Times A Day 9)  Meclizine Hcl 25 Mg Tabs (Meclizine Hcl) .Marland Kitchen.. 1 By Mouth Q 4 Hours As Needed 10)  Tylenol Extra Strength 500 Mg Tabs (Acetaminophen) .Marland Kitchen.. 1 By Mouth As Needed 11)  Fish Oil 1200 Mg Caps (Omega-3 Fatty Acids) .... 2 By Mouth Every Morning 12)  Vitamin C Cr 500 Mg Cr-Tabs (Ascorbic Acid) .Marland Kitchen.. 1 By Mouth Once Daily 13)  Align  Caps (Probiotic Product) .Marland Kitchen.. 1 Capsule By Mouth Once Daily 14)  Lantus 100 Unit/ml Soln (Insulin Glargine) .... 30 Units Every Morning  Allergies: 1)  ! Pcn 2)  ! Cipro 3)  ! * Eggs 4)  Amoxicillin (Amoxicillin)  Past History:  Past Medical History: Last updated: 07/03/2009 Current Problems:  MITRAL REGURGITATION, MILD (ICD-396.3) HYPERTENSION, UNSPECIFIED (ICD-401.9) HYPERLIPIDEMIA (ICD-272.4) ESOPHAGITIS, REFLUX  (ICD-530.11) DIABETES MELLITUS, TYPE II (ICD-250.00) EUSTACHIAN TUBE DYSFUNCTION (ICD-381.81) ASTHMA (ICD-493.90) ALLERGIC RHINITIS (ICD-477.9) ARTHRITIS (ICD-716.90) IBS (ICD-564.1) MIGRAINES, HX OF (ICD-V13.8) DIVERTICULOSIS, COLON, WITH HEMORRHAGE (ICD-562.12) GASTROPARESIS (ICD-536.3) CONSTIPATION (ICD-564.00)  Past Surgical History: Last updated: 07/03/2009 Cholecystectomy lens implant left eye Tubal ligation wrist surgery ankle surgery  Family History: Last updated: 10/21/2009 Mother deceased age 34 of stroke. Father deceased age 59 of stroke. 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 No ETOH use. Illicit Drug Use - no  Risk Factors: Smoking Status: quit (04/04/2007)  Vital Signs:  Patient profile:   77 year old female Height:      62.5 inches Weight:      164.50 pounds BMI:     29.71 Pulse rate:   63 / minute Pulse rhythm:   regular Resp:     18 per minute BP sitting:   124 / 60  (left arm) Cuff size:   large  Vitals Entered By: Vikki Ports (March 03, 2010 2:15 PM)  Physical Exam  General:  Well developed, well nourished, in no acute distress. Head:  normocephalic and atraumatic Eyes:  PERRLA/EOM intact; conjunctiva and lids normal. Lungs:  Clear bilaterally to auscultation and percussion. Heart:  PMI not displaced.  Apical murmur is not prominent.  Normal S1 and S2.  Pos  S4.   Abdomen:  Bowel  sounds positive; abdomen soft and non-tender without masses, organomegaly, or hernias noted. No hepatosplenomegaly. Pulses:  pulses normal in all 4 extremities Extremities:  No clubbing or cyanosis. Neurologic:  Alert and oriented x 3.   Echocardiogram  Procedure date:  02/25/2010  Findings:      Study Conclusions            - Left ventricle: The cavity size was normal. Wall thickness was       normal. Systolic function was normal.  The estimated ejection       fraction was in the range of 55% to 60%. Wall motion was normal;       there were no regional wall motion abnormalities. Doppler       parameters are consistent with abnormal left ventricular       relaxation (grade 1 diastolic dysfunction).     - Aortic valve: Trileaflet. There was no stenosis. Mild       regurgitation.     - Mitral valve: Mild regurgitation. No definite mitral valve       prolapse.     - Left atrium: The atrium was mildly dilated.     - Right ventricle: The cavity size was normal. Systolic function was       normal.     - Pulmonary arteries: PA peak pressure: 35mm Hg (S).     - Inferior vena cava: The vessel was normal in size; the       respirophasic diameter changes were in the normal range (= 50%);       findings are consistent with normal central venous pressure.     Impressions:            - Normal LV size with normal systolic function, EF 55-60%. Mild       aortic insufficiency. Mild mitral regurgitation but no definite       mitral valve prolapse noted.  Echocardiogram  Procedure date:  02/25/2010  Findings:      Study Conclusions            - Left ventricle: The cavity size was normal. Wall thickness was       increased in a pattern of mild LVH. Systolic function was normal.       The estimated ejection fraction was in the range of 60% to 65%.       Wall motion was normal; there were no regional wall motion       abnormalities. Features are consistent with a pseudonormal left       ventricular filling pattern, with concomitant abnormal relaxation       and increased filling pressure (grade 2 diastolic dysfunction).       E/medial e' > 15, suggesting LV end diastolic pressure at least 20       mmHg.     - Aortic valve: There was no stenosis.     - Mitral valve: Moderate regurgitation.     - Left atrium: The atrium was moderately dilated.     - Right ventricle: The cavity size was normal. Systolic function was        normal.     - Tricuspid valve: Peak RV-RA gradient:57mm Hg (S).     - Pulmonary arteries: PA peak pressure: 56mm Hg (S).     - Inferior vena cava: The vessel was normal in size; the       respirophasic diameter changes were in the normal range (= 50%);  findings are consistent with normal central venous pressure.     Impressions:            - Normal LV size with mild LV hypertrophy. EF 60-65% with moderate       diastolic dysfunction. Normal RV size and systolic function. PA       systolic pressure 56 mmHg (moderate pulmonary hypertension).       Patient has evidence for elevated left atrial pressure, but PA       systolic pressure elevation may be out of proportion to this.  EKG  Procedure date:  03/03/2010  Findings:      NSR.  LVH.  Inferolateral ST and T changes, likely from repolarization abnormality, but cannot exclude ischemia.  No definite change from May tracing.   Impression & Recommendations:  Problem # 1:  CHEST TIGHTNESS-PRESSURE-OTHER ([id number]) The patient has had some chest tightness, and also has evidence of elevated LVEDP, pulm hypertension, and at least moderate MR.  Think R and L heart cath is best option to evaluate coronary anatomy, determine source of pulm hypertension, and then determine if we need TEE to better assess the valve.  She has had some vague symptoms, and is agreeable to this approach.  Risks, benefits, and alternatives have been discussed, and she agrees with evaluation.  Will arrange.  She would prefer to do next week. Orders: T-2 View CXR (71020TC)  Problem # 2:  PULMONARY HYPERTENSION, SECONDARY (ICD-416.8) probably related to LVEDP.  See prior cath data.   Problem # 3:  MITRAL REGURGITATION, MILD (ICD-396.3) See multiple echo reports in the past.  Problem # 4:  HYPERTENSION, UNSPECIFIED (ICD-401.9) appears to be under good control with ARB. Her updated medication list for this problem includes:    Diovan Hct 160-12.5 Mg Tabs  (Valsartan-hydrochlorothiazide) .Marland Kitchen... Take 1/2 tablet daily    Bayer Aspirin Ec Low Dose 81 Mg Tbec (Aspirin) .Marland Kitchen... 1 by mouth daily  Other Orders: EKG w/ Interpretation (93000) Cardiac Catheterization (Cardiac Cath) TLB-BMP (Basic Metabolic Panel-BMET) (80048-METABOL) TLB-CBC Platelet - w/Differential (85025-CBCD) TLB-PT (Protime) (85610-PTP)  Patient Instructions: 1)  Your physician recommends that you schedule a follow-up appointment in: 3 weeks after your cardiac cathertization 2)  Your physician recommends that you continue on your current medications as directed. Please refer to the Current Medication list given to you today. 3)  Your physician has requested that you have a cardiac catheterization.  Cardiac catheterization is used to diagnose and/or treat various heart conditions. Doctors may recommend this procedure for a number of different reasons. The most common reason is to evaluate chest pain. Chest pain can be a symptom of coronary artery disease (CAD), and cardiac catheterization can show whether plaque is narrowing or blocking your heart's arteries. This procedure is also used to evaluate the valves, as well as measure the blood flow and oxygen levels in different parts of your heart.  For further information please visit [email or web address]  Please follow instruction sheet, as given.

## 2010-07-17 NOTE — Miscellaneous (Signed)
Summary: Routine Allergy Skin Tests/[facility]  Routine Allergy Skin Tests/ [facility]   Imported By: Esmeralda Links D'jimraou 07/05/2008 15:19:18  _____________________________________________________________________  External Attachment:    Type:   Image     Comment:   External Document

## 2010-07-17 NOTE — Progress Notes (Signed)
Summary: allergy testing-pt called again  Phone Note Call from Patient Call back at Home Phone [phone]   Caller: Patient Call For: young Reason for Call: Talk to Nurse Summary of Call: patient wanting to know if she can be set up for allergy testing/patient states that dr young had mentioned doing testing in spring, but patient says she cannot wait that long and is having problems now. Initial call taken by: Lehman Prom,  May 08, 2008 8:30 AM  Follow-up for Phone Call        No record of allergy testing recommendations in pt's last OV note.  CY is this OK to schedule for pt, as she is having problems currently and wishes to be seen. Follow-up by: Cloyde Reams RN,  May 08, 2008 9:42 AM  Additional Follow-up for Phone Call Additional follow up Details #1::        pt requests to have ALT before her eye surgery that is pending on 05/24/08. pt says her eyes are very "itchy". Tivis Ringer  May 11, 2008 1:05 PM Additional Follow-up by: Waymon Budge MD,  May 14, 2008 2:01 PM    Additional Follow-up for Phone Call Additional follow up Details #2::    Please talk with Florentina Addison to see what space can be found for allergy testing- likely will have to be after her eye surgery. Follow-up by: Waymon Budge MD,  May 14, 2008 2:02 PM  Additional Follow-up for Phone Call Additional follow up Details #3:: Details for Additional Follow-up Action Taken: Pt has appt on 05-22-08 at 4:15pm for allergy testing. Pt and CDY aware. Reynaldo Minium CMA  May 14, 2008 5:07 PM

## 2010-07-17 NOTE — Progress Notes (Signed)
Summary: Sugar drop to 98 pt has TEE today  Phone Note Call from Patient Call back at Home Phone [phone]   Caller: Patient Reason for Call: Talk to Nurse Summary of Call: pt has a TEE schedule for today. pt states her sugar 98. pt wants to know what she needs to do.  Follow-up for Phone Call        I spoke with the pt and she did not take her Lantus per instructions.  The pt did drink some Ginger Ale prior to 7 AM.  The pt feels fine at this time.  I instructed the pt to suck on hard candy if needed for any hypoglycemia symptoms.  Pt agreed with plan.   Follow-up by: Julieta Gutting, RN, BSN,  June 23, 2010 9:04 AM

## 2010-07-17 NOTE — Assessment & Plan Note (Signed)
Summary: 6wk f/u tee   Visit Type:  Follow-up Primary Provider:  Fuller Mandril, MD  CC:  Sob and Leg cramps .  History of Present Illness: Doing well overall.  Gets a little wheeze when she bends over.  Denies any chest pain.  Not increase in exertional shortness of breath.    Problems Prior to Update: 1)  Chest Pain Unspecified  (ICD-786.50) 2)  Chest Tightness-pressure-other  ([id number]) 3)  Chronic Diastolic Heart Failure  (ICD-428.32) 4)  Pulmonary Hypertension, Secondary  (ICD-416.8) 5)  Arm Numbness  (ICD-782.0) 6)  Mitral Regurgitation, Mild  (ICD-396.3) 7)  Hypertension, Unspecified  (ICD-401.9) 8)  Hyperlipidemia  (ICD-272.4) 9)  Esophagitis, Reflux  (ICD-530.11) 10)  Diabetes Mellitus, Type II  (ICD-250.00) 11)  Eustachian Tube Dysfunction  (ICD-381.81) 12)  Asthma  (ICD-493.90) 13)  Allergic Rhinitis  (ICD-477.9) 14)  Arthritis  (ICD-716.90) 15)  Ibs  (ICD-564.1) 16)  Migraines, Hx of  (ICD-V13.8) 17)  Diverticulosis, Colon, With Hemorrhage  (ICD-562.12) 18)  Gastroparesis  (ICD-536.3) 19)  Constipation  (ICD-564.00)  Current Medications (verified): 1)  Diovan Hct 160-12.5 Mg  Tabs (Valsartan-Hydrochlorothiazide) .... Take 1/2 Tablet Daily 2)  Fluticasone Propionate 50 Mcg/act Susp (Fluticasone Propionate) .... 2 Puffs Each Nostril Daily 3)  Advair Diskus 100-50 Mcg/dose  Misc (Fluticasone-Salmeterol) .Marland Kitchen.. 1 Puff Two Times A Day 4)  Bayer Aspirin Ec Low Dose 81 Mg  Tbec (Aspirin) .Marland Kitchen.. 1 By Mouth Daily 5)  Lipitor 40 Mg Tabs (Atorvastatin Calcium) .... Take One Tablet By Mouth Daily. 6)  Zyrtec Allergy 10 Mg Tabs (Cetirizine Hcl) .... Every Other Day 7)  Dexilant 60 Mg Cpdr (Dexlansoprazole) .... Take 1 Capsule By Mouth Once A Day 8)  Lyrica 75 Mg Caps (Pregabalin) .... Take 1 Capsule By Mouth Two Times A Day 9)  Meclizine Hcl 25 Mg Tabs (Meclizine Hcl) .Marland Kitchen.. 1 By Mouth Q 4 Hours As Needed 10)  Tylenol Extra Strength 500 Mg Tabs (Acetaminophen) .Marland Kitchen.. 1 By Mouth As  Needed 11)  Fish Oil 1200 Mg Caps (Omega-3 Fatty Acids) .... 2 By Mouth Every Morning 12)  Vitamin C Cr 500 Mg Cr-Tabs (Ascorbic Acid) .Marland Kitchen.. 1 By Mouth Once Daily 13)  Align  Caps (Probiotic Product) .Marland Kitchen.. 1 Capsule By Mouth Once Daily 14)  Lantus 100 Unit/ml Soln (Insulin Glargine) .... 30 Units Every Morning 15)  Carafate 1 Gm Tabs (Sucralfate) .... Take 1 Tablet By Mouth Three Times A Day  Allergies: 1)  ! Pcn 2)  ! Cipro 3)  ! * Eggs 4)  Amoxicillin (Amoxicillin)  Vital Signs:  Patient profile:   77 year old female Height:      62.5 inches Weight:      159.25 pounds BMI:     28.77 Pulse rate:   64 / minute Pulse rhythm:   regular Resp:     18 per minute BP sitting:   110 / 58  (left arm) Cuff size:   large  Vitals Entered By: Vikki Ports (July 07, 2010 9:36 AM)  Physical Exam  General:  Well developed, well nourished, in no acute distress. Head:  normocephalic and atraumatic Eyes:  PERRLA/EOM intact; conjunctiva and lids normal. Neck:  no JVP Lungs:  Clear bilaterally to auscultation and percussion. Heart:  PMI non dipslaced.  Soft AI murmur 1/6.  Soft apcial MR murmur.  Extremities:  No clubbing or cyanosis.  No edema Neurologic:  Alert and oriented x 3.   EKG  Procedure date:  07/07/2010  Findings:  NSR. Probable LVH with repole changes. Cannot exclude ischemia. No change compared to old tracings.     Impression & Recommendations:  Problem # 1:  MITRAL REGURGITATION, MILD (ICD-396.3) See recent TEE report.  Will continue to follow with primarily medical approach.  Doubt wheezing is secondary to this.  Orders: EKG w/ Interpretation (93000) TLB-BMP (Basic Metabolic Panel-BMET) (80048-METABOL) TLB-Magnesium (Mg) (83735-MG)  Problem # 2:  HYPERTENSION, UNSPECIFIED (ICD-401.9) well controlled.  110 systolic by me today.  Has leg cramps.  Will get BMET, and MG. Her updated medication list for this problem includes:    Diovan Hct 160-12.5 Mg Tabs  (Valsartan-hydrochlorothiazide) .Marland Kitchen... Take 1/2 tablet daily    Bayer Aspirin Ec Low Dose 81 Mg Tbec (Aspirin) .Marland Kitchen... 1 by mouth daily  Problem # 3:  HYPERLIPIDEMIA (ICD-272.4) currently on medical therapy Her updated medication list for this problem includes:    Lipitor 40 Mg Tabs (Atorvastatin calcium) .Marland Kitchen... Take one tablet by mouth daily.  Patient Instructions: 1)  Your physician recommends that you have lab work today: BMP and Magnesium 2)  Your physician recommends that you continue on your current medications as directed. Please refer to the Current Medication list given to you today. 3)  Your physician wants you to follow-up in:  3 MONTHS.  You will receive a reminder letter in the mail two months in advance. If you don't receive a letter, please call our office to schedule the follow-up appointment.

## 2010-07-17 NOTE — Consult Note (Signed)
Summary: Consultation Report/new pt eval/eustanicn tube disfunction/[facility]  Consultation Report/new pt eval/eustanicn tube disfunction/[facility]   Imported By: Lester La Cygne 01/11/2008 10:07:30  _____________________________________________________________________  External Attachment:    Type:   Image     Comment:   External Document

## 2010-07-17 NOTE — Letter (Signed)
Summary: Cardiac Catheterization Instructions- Main Lab  [facility], Main Office  [address]   [place], [place] [address]   Phone: [phone]  Fax: [phone]     03/03/2010 MRN: [id number]  Kerry Barnes [address] [place], [place]  [address]  Dear Kerry Barnes,   You are scheduled for Cardiac Catheterization on September 26th, 2011             with Dr. Riley Kill.  Please arrive at the [facility] at 10:00     am. on the day of your procedure.  1. DIET     _X___ Nothing to eat or drink after midnight except your medications with a sip of water.   2. MAKE SURE YOU TAKE YOUR ASPIRIN.  3. ___X__ DO NOT TAKE these medications before your procedure:     Do not take your Lantus the morning of your procedure.      __X__ YOU MAY TAKE ALL of your remaining medications with a small amount of water.  4. Plan for one night stay - bring personal belongings (i.e. toothpaste, toothbrush, etc.)  5. Bring a current list of your medications and current insurance cards.  6. Must have a responsible person to drive you home.   8. Someone must be with you for the first 24 hours after you arrive home.  9. Please wear clothes that are easy to get on and off and wear slip-on shoes.  *Special note: Every effort is made to have your procedure done on time.  Occasionally there are emergencies that present themselves at the hospital that may cause delays.  Please be patient if a delay does occur.  If you have any questions after you get home, please call the office at the number listed above.  Kerry Maeola Sarah RN

## 2010-07-17 NOTE — Progress Notes (Signed)
   FAxed ROI over to [facility] [place] to fax [phone] [facility]  July 04, 2009 12:43 PM    Appended Document:  Recieved records back from [facility] @ [address]Marland KitchenMarland KitchenForwarded to [employer]

## 2010-07-17 NOTE — Assessment & Plan Note (Signed)
Summary: Establish with Dr Riley Kill  Medications Added LIPITOR 40 MG TABS (ATORVASTATIN CALCIUM) Take one tablet by mouth daily. ZYRTEC ALLERGY 10 MG TABS (CETIRIZINE HCL) every other day DEXILANT 60 MG CPDR (DEXLANSOPRAZOLE) Take 1 capsule by mouth once a day LYRICA 75 MG CAPS (PREGABALIN) Take 1 capsule by mouth two times a day        Visit Type:  Follow-up Primary Provider:  Dr. Fuller Mandril  CC:  Heart flutter- Left hand and right arm numbness.  History of Present Illness: Patient asked to be seen.  Has been having some numbness in the hands.  It involved both hands, is not related to exertion, and unassociated with chest pain or shortness of breath.  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  We do not have films.  She has not had progressive shortness of breath despite prior findings.  The discomfort is intermiitent, and not associated with diaphoresis, dyspnea, or other major symptoms.  She also has hand arthritis.  She rarely has some fluttering of the heart, no syncope.  Current Medications (verified): 1)  Diovan Hct 160-12.5 Mg  Tabs (Valsartan-Hydrochlorothiazide) .Marland Kitchen.. 1 By Mouth Daily 2)  Fluticasone Propionate 50 Mcg/act Susp (Fluticasone Propionate) .... 2 Puffs Each Nostril Daily 3)  Advair Diskus 100-50 Mcg/dose  Misc (Fluticasone-Salmeterol) .Marland Kitchen.. 1 Puff Two Times A Day 4)  Bayer Aspirin Ec Low Dose 81 Mg  Tbec (Aspirin) .Marland Kitchen.. 1 By Mouth Daily 5)  Lipitor 40 Mg Tabs (Atorvastatin Calcium) .... Take One Tablet By Mouth Daily. 6)  Zyrtec Allergy 10 Mg Tabs (Cetirizine Hcl) .... Every Other Day 7)  Dexilant 60 Mg Cpdr (Dexlansoprazole) .... Take 1 Capsule By Mouth Once A Day 8)  Lyrica 75 Mg Caps (Pregabalin) .... Take 1 Capsule By Mouth Two Times A Day  Allergies: 1)  ! Pcn 2)  ! Cipro 3)  ! * Eggs 4)  Amoxicillin (Amoxicillin)  Past History:  Past Medical History: Last updated: 05/22/2008 Allergic rhinitis - Skin test  05/22/08 Asthma Constipation GERD Esophageal stricture Irritable Bowel Syndrome Diverticulosis MIgraines Diabetes Gastroparesis  Past Surgical History: Last updated: 09/24/2008 Cholecystectomy lens implant left eye  Family History: Reviewed history from 12/05/2007 and no changes required. Mother deceased age 26 of stroke. Father deceased age 61 of stroke. Stroke--sister Diabetes--brother  Social History: Reviewed history from 12/05/2007 and no changes required. 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 No ETOH use.  Review of Systems       The patient complains of vision loss, hoarseness, and severe indigestion/heartburn.  The patient denies anorexia, fever, weight loss, weight gain, decreased hearing, chest pain, syncope, dyspnea on exertion, peripheral edema, prolonged cough, headaches, hemoptysis, abdominal pain, melena, hematochezia, hematuria, incontinence, transient blindness, difficulty walking, depression, unusual weight change, and breast masses.         Has cataract both eyes.  Had bifocal implant left eye.   R not ready yet.  Hooarseness secondary to sinus drainage.  Has stricture but has acid refux pretty bad.  Had mammogram last December.  Also had bone density.  Vital Signs:  Patient profile:   77 year old female Height:      62.5 inches Weight:      156 pounds BMI:     28.18 Pulse rate:   62 / minute Pulse rhythm:   regular Resp:     18 per minute BP sitting:   124 / 74  (left arm) Cuff  size:   regular  Vitals Entered By: Vikki Ports (July 04, 2009 10:36 AM)  Physical Exam  General:  Well developed, well nourished, in no acute distress. Head:  normocephalic and atraumatic Eyes:  PERRLA/EOM intact; conjunctiva and lids normal. Ears:  TM's intact and clear with normal canals and hearing Nose:  no deformity, discharge, inflammation, or lesions Neck:  Some tenderness posterior neck.   Lungs:  Clear bilaterally to auscultation and  percussion. Heart:  PMI non displaced.  I do not appreciate a murmur presently.  No rub or S4 gallop heard. Abdomen:  Bowel sounds positive; abdomen soft and non-tender without masses, organomegaly, or hernias noted. No hepatosplenomegaly. Msk:  Some firmness posterior neck.  Changes of DJD in both hands.   Pulses:  pulses normal in all 4 extremities.  Demonstrated to the patient.  Radial and ulnar, brachial, and LE all intact. Extremities:  No clubbing or cyanosis.  No edema. Neurologic:  Alert and oriented x 3.   Cardiac Cath  Procedure date:  10/08/2000  Findings:      HEMODYNAMIC DATA: 1. Pressures:    a. The right atrium 13.    b. Right ventricle 48/15.    c. Pulmonary artery 44/21.    d. Pulmonary capillary wedge 20.    e. Aortic 170/76.    f. LV 161/20.    g. No gradient on pullback across the aortic valve. 2. Cardiac outputs:    a. Thermodilution 2.8/1.57.    b. Fick 3.3/1/8. 3. Saturations:    a. Superior vena cava 60%.    b. Pulmonary artery 68%.    c. AO 96%.  ANGIOGRAPHIC DATA:  VENTRICULOGRAPHY:  Ventriculography was performed in the RAO projection. Ejection fraction was calculated at 75.3%.  There appeared to be at least 2-3+ mitral regurgitation, some slight flow into the pulmonary veins was noted. Overall, LV function, however, was normal and there were no segmental wall motion abnormalities.  The left main coronary artery was free of critical disease.  The LAD coursed to the apex.  It provided one large diagonal branch and several small diagonal branches.  The LAD was free of critical disease.  The circumflex consisted of a very large bifurcating marginal branch then a smaller AV circumflex branch.  The circumflex and its branches were free of significant disease.  The right coronary artery was a dominant vessel providing the posterior descending and posterolateral system.  The right coronary artery appeared to be free of critical  disease.  CONCLUSIONS: 1. Mitral regurgitation. 2. Pulmonary hypertension with elevated wedge, probably on the basis of    mitral regurgitation. 3. No significant coronary obstruction.  DISPOSITION:  The patient may need TEE.  We will keep her overnight and plan  Echocardiogram  Procedure date:  09/14/2007  Findings:       SUMMARY   -  Overall left ventricular systolic function was normal. Left         ventricular ejection fraction was estimated , range being 55         % to 65 %. There were no left ventricular regional wall         motion abnormalities. Doppler parameters were consistent with         high left ventricular filling pressure.   -  There was mild mitral valvular regurgitation.   -  The left atrium was moderately dilated.   -  The pulmonary veins were grossly normal.   -  The estimated peak pulmonary artery  systolic pressure was mild to         moderately increased.   EKG  Procedure date:  07/04/2009  Findings:      NSR.  LVH with repole changes, more prominent than before.  Impression & Recommendations:  Problem # 1:  MITRAL REGURGITATION, MILD (ICD-396.3) Last echo as noted.  No audible murmur, but elevated pulm pressures.  ??diastolic abnl Orders: EKG w/ Interpretation (93000) Echocardiogram (Echo) Treadmill (Treadmill)  Problem # 2:  PULMONARY HYPERTENSION, SECONDARY (ICD-416.8) Question secondary to diastolic abnormalities.  See prior echo.  LVH by EKG.  Problem # 3:  ARM NUMBNESS (ICD-782.0) Symptoms seem more likely from cervical spine disease.  Not exertion related, both hands, had severe DJD by history, and also changes in hands suggest DJD as well.   Will recommend standard stress, and repeat 2d echo to assess MR, and exclude ischemia.  May need nuclear study given baseline changes.  Will get neck films.  Problem # 4:  DIABETES MELLITUS, TYPE II (ICD-250.00)  Problem # 5:  HYPERLIPIDEMIA (ICD-272.4) Monitored by primary MD. The following  medications were removed from the medication list:    Lipitor 20 Mg Tabs (Atorvastatin calcium) .Marland Kitchen... 1 by mouth daily Her updated medication list for this problem includes:    Lipitor 40 Mg Tabs (Atorvastatin calcium) .Marland Kitchen... Take one tablet by mouth daily.  Patient Instructions: 1)  Your physician has requested that you have an echocardiogram.  Echocardiography is a painless test that uses sound waves to create images of your heart. It provides your doctor with information about the size and shape of your heart and how well your heart's chambers and valves are working.  This procedure takes approximately one hour. There are no restrictions for this procedure. 2)  Your physician has requested that you have an exercise tolerance test.  For further information please visit [email or web address]  Please also follow instruction sheet, as given. 3)  Your physician recommends that you continue on your current medications as directed. Please refer to the Current Medication list given to you today.

## 2010-07-17 NOTE — Assessment & Plan Note (Signed)
Summary: allergy testing//kcw   Vital Signs:  Patient Profile:   77 Years Old Female Weight:      150.38 pounds O2 Sat:      98 % O2 treatment:    Room Air Pulse rate:   78 / minute BP sitting:   138 / 64  (left arm) Cuff size:   regular  Vitals Entered By: Reynaldo Minium CMA (May 22, 2008 3:59 PM)             Comments Medications reviewed with patient Reynaldo Minium CMA  May 22, 2008 3:59 PM      PCP:  Dr. Fuller Mandril  Chief Complaint:  allergy testing.  History of Present Illness: Current Problems:  ESOPHAGITIS, REFLUX (ICD-530.11) DM (ICD-250.00) EUSTACHIAN TUBE DYSFUNCTION (ICD-381.81) ASTHMA (ICD-493.90) ALLERGIC RHINITIS (ICD-477.9)  04/05/08- 89 year old woman returning for follow-up of allergic rhinitis and asthma.  Complains of sinus pressure. C/o tinnnitus, asks ENT, lost singing voice.gets episodes of itching in her ears.  Has been treated with antibiotics and told she has fluid in her ears but the antibiotics don't help.  Her primary physician did chest x-ray reported clear and she denies any lower respiratory discomfort, including cough, wheeze, chest tightness, or phlegm.  She sings iin choirs and complains that her throat seems to close.  She gets out of breath too easily while singing.  04/05/08- Saw Dr. Annalee Genta in June:" Eustachian dysfunction, allergic rhinitis, reflux". She stayed in during ragweed season. Nasal congestion.Added Flonase.  Reviewed PFT 12/28/07- FEV1 2.06/109%, FEV1/FVC 0.87 with minimal response to BD. DLCO-74%.  05/22/08- allergic rhinitis, asthma, eustachian dysfunction For skin testing today, off antihistamines complains ears and skin itch.  Skin Test: Mild POS reactions to grass, weed and tree pollens       Prior Medications Reviewed Using: Patient Recall  Prior Medication List:  LIPITOR 20 MG  TABS (ATORVASTATIN CALCIUM) 1 by mouth daily DIOVAN HCT 160-12.5 MG  TABS (VALSARTAN-HYDROCHLOROTHIAZIDE) 1 by mouth  daily AMITIZA 24 MCG  CAPS (LUBIPROSTONE) BID FLUTICASONE PROPIONATE 50 MCG/ACT SUSP (FLUTICASONE PROPIONATE) 2 puffs each nostril daily ADVAIR DISKUS 100-50 MCG/DOSE  MISC (FLUTICASONE-SALMETEROL) 1 puff two times a day NEXIUM 40 MG  CPDR (ESOMEPRAZOLE MAGNESIUM) 1 capsule each day 30 minutes before meal BAYER ASPIRIN EC LOW DOSE 81 MG  TBEC (ASPIRIN) 1 by mouth daily   Updated Prior Medication List: LIPITOR 20 MG  TABS (ATORVASTATIN CALCIUM) 1 by mouth daily DIOVAN HCT 160-12.5 MG  TABS (VALSARTAN-HYDROCHLOROTHIAZIDE) 1 by mouth daily AMITIZA 24 MCG  CAPS (LUBIPROSTONE) BID FLUTICASONE PROPIONATE 50 MCG/ACT SUSP (FLUTICASONE PROPIONATE) 2 puffs each nostril daily ADVAIR DISKUS 100-50 MCG/DOSE  MISC (FLUTICASONE-SALMETEROL) 1 puff two times a day NEXIUM 40 MG  CPDR (ESOMEPRAZOLE MAGNESIUM) 1 capsule each day 30 minutes before meal BAYER ASPIRIN EC LOW DOSE 81 MG  TBEC (ASPIRIN) 1 by mouth daily  Current Allergies (reviewed today): ! PCN ! CIPRO ! * EGGS AMOXICILLIN (AMOXICILLIN)  Past Medical History:    Reviewed history from 09/20/2007 and no changes required:       Allergic rhinitis - Skin test 05/22/08       Asthma       Constipation       GERD       Esophageal stricture       Irritable Bowel Syndrome       Diverticulosis       MIgraines       Diabetes       Gastroparesis   Social  History:    Reviewed history from 12/05/2007 and no changes required:       Pt is a housewife.       Never smoked.       Pt has 8 children.       No ETOH use.    Review of Systems      See HPI   Physical Exam  General: A/Ox3; pleasant and cooperative, NAD, SKIN: no rash, lesions NODES: no lymphadenopathy HEENT: [place]/AT, EOM- WNL, Conjuctivae- clear, PERRLA, TM-WNL, Nose- clear, Throat- clear and wnl, dentures NECK: Supple w/ fair ROM, JVD- none, normal carotid impulses w/o bruits Thyroid- normal to palpation CHEST: Clear to P&A HEART: RRR, no m/g/r heard      Problem # 1:   ALLERGIC RHINITIS (ICD-477.9) Skin tests are not strongly positive. W e discussed irritant and nonspecific alternatives to allergy basis for rhinits and asthma. Her updated medication list for this problem includes:    Fluticasone Propionate 50 Mcg/act Susp (Fluticasone propionate) .Marland Kitchen... 2 puffs each nostril daily    Loratadine 10 Mg Tabs (Loratadine) .Marland Kitchen... 1 daily as needed- antihistamine   Problem # 2:  ASTHMA (ICD-493.90) Discussed nonallergic triggers and therapeutic options. Her updated medication list for this problem includes:    Advair Diskus 100-50 Mcg/dose Misc (Fluticasone-salmeterol) .Marland Kitchen... 1 puff two times a day   Medications Added to Medication List This Visit: 1)  Loratadine 10 Mg Tabs (Loratadine) .Marland Kitchen.. 1 daily as needed- antihistamine   Patient Instructions: 1)  Please schedule a follow-up appointment in 4 months. 2)  Allergy tests showed reactions to grass, tree and weed pollens. Suggest you try using an over the counter antihistamine like claritin/loratadine/alavert, one daily when needed for sneezing and itching. If this doesn't work well enough we can try other medicines or go back on allergy vaccine.   ]

## 2010-08-28 LAB — GLUCOSE, CAPILLARY
Glucose-Capillary: 119 mg/dL — ABNORMAL HIGH (ref 70–99)
Glucose-Capillary: 94 mg/dL (ref 70–99)

## 2010-08-28 LAB — POCT I-STAT 3, VENOUS BLOOD GAS (G3P V)
Acid-Base Excess: 1 mmol/L (ref 0.0–2.0)
Acid-Base Excess: 2 mmol/L (ref 0.0–2.0)
Acid-base deficit: 1 mmol/L (ref 0.0–2.0)
Bicarbonate: 24.4 mEq/L — ABNORMAL HIGH (ref 20.0–24.0)
Bicarbonate: 25.6 mEq/L — ABNORMAL HIGH (ref 20.0–24.0)
Bicarbonate: 26.2 mEq/L — ABNORMAL HIGH (ref 20.0–24.0)
O2 Saturation: 62 %
O2 Saturation: 66 %
O2 Saturation: 95 %
TCO2: 26 mmol/L (ref 0–100)
TCO2: 27 mmol/L (ref 0–100)
TCO2: 27 mmol/L (ref 0–100)
pCO2, Ven: 36.4 mmHg — ABNORMAL LOW (ref 45.0–50.0)
pCO2, Ven: 40.7 mmHg — ABNORMAL LOW (ref 45.0–50.0)
pCO2, Ven: 41.5 mmHg — ABNORMAL LOW (ref 45.0–50.0)
pH, Ven: 7.378 — ABNORMAL HIGH (ref 7.250–7.300)
pH, Ven: 7.416 — ABNORMAL HIGH (ref 7.250–7.300)
pH, Ven: 7.454 — ABNORMAL HIGH (ref 7.250–7.300)
pO2, Ven: 33 mmHg (ref 30.0–45.0)
pO2, Ven: 34 mmHg (ref 30.0–45.0)
pO2, Ven: 71 mmHg — ABNORMAL HIGH (ref 30.0–45.0)

## 2010-09-08 ENCOUNTER — Emergency Department (HOSPITAL_COMMUNITY): Payer: Medicare Other

## 2010-09-08 ENCOUNTER — Observation Stay (HOSPITAL_COMMUNITY)
Admission: EM | Admit: 2010-09-08 | Discharge: 2010-09-09 | Disposition: A | Discharge status: Home / Self Care | Payer: Medicare Other | Attending: Internal Medicine | Admitting: Internal Medicine

## 2010-09-08 ENCOUNTER — Encounter (HOSPITAL_COMMUNITY): Payer: Self-pay | Admitting: Radiology

## 2010-09-08 ENCOUNTER — Other Ambulatory Visit: Payer: Self-pay | Admitting: Internal Medicine

## 2010-09-08 DIAGNOSIS — I059 Rheumatic mitral valve disease, unspecified: Secondary | ICD-10-CM | POA: Insufficient documentation

## 2010-09-08 DIAGNOSIS — M6281 Muscle weakness (generalized): Secondary | ICD-10-CM | POA: Insufficient documentation

## 2010-09-08 DIAGNOSIS — R4789 Other speech disturbances: Secondary | ICD-10-CM | POA: Insufficient documentation

## 2010-09-08 DIAGNOSIS — I519 Heart disease, unspecified: Secondary | ICD-10-CM | POA: Insufficient documentation

## 2010-09-08 DIAGNOSIS — R209 Unspecified disturbances of skin sensation: Secondary | ICD-10-CM

## 2010-09-08 DIAGNOSIS — M129 Arthropathy, unspecified: Secondary | ICD-10-CM | POA: Insufficient documentation

## 2010-09-08 DIAGNOSIS — I10 Essential (primary) hypertension: Secondary | ICD-10-CM | POA: Insufficient documentation

## 2010-09-08 DIAGNOSIS — E785 Hyperlipidemia, unspecified: Secondary | ICD-10-CM | POA: Insufficient documentation

## 2010-09-08 DIAGNOSIS — H538 Other visual disturbances: Secondary | ICD-10-CM | POA: Insufficient documentation

## 2010-09-08 DIAGNOSIS — Z79899 Other long term (current) drug therapy: Secondary | ICD-10-CM | POA: Insufficient documentation

## 2010-09-08 DIAGNOSIS — K219 Gastro-esophageal reflux disease without esophagitis: Secondary | ICD-10-CM | POA: Insufficient documentation

## 2010-09-08 DIAGNOSIS — I6350 Cerebral infarction due to unspecified occlusion or stenosis of unspecified cerebral artery: Principal | ICD-10-CM | POA: Insufficient documentation

## 2010-09-08 DIAGNOSIS — Z7902 Long term (current) use of antithrombotics/antiplatelets: Secondary | ICD-10-CM | POA: Insufficient documentation

## 2010-09-08 DIAGNOSIS — E119 Type 2 diabetes mellitus without complications: Secondary | ICD-10-CM | POA: Insufficient documentation

## 2010-09-08 DIAGNOSIS — I517 Cardiomegaly: Secondary | ICD-10-CM | POA: Insufficient documentation

## 2010-09-08 DIAGNOSIS — I2789 Other specified pulmonary heart diseases: Secondary | ICD-10-CM | POA: Insufficient documentation

## 2010-09-08 HISTORY — DX: Essential (primary) hypertension: I10

## 2010-09-08 LAB — POCT I-STAT, CHEM 8
BUN: 29 mg/dL — ABNORMAL HIGH (ref 6–23)
Calcium, Ion: 1.14 mmol/L (ref 1.12–1.32)
Chloride: 108 mEq/L (ref 96–112)
Creatinine, Ser: 1.1 mg/dL (ref 0.4–1.2)
Glucose, Bld: 134 mg/dL — ABNORMAL HIGH (ref 70–99)
HCT: 40 % (ref 36.0–46.0)
Hemoglobin: 13.6 g/dL (ref 12.0–15.0)
Potassium: 4.4 mEq/L (ref 3.5–5.1)
Sodium: 141 mEq/L (ref 135–145)
TCO2: 22 mmol/L (ref 0–100)

## 2010-09-08 LAB — DIFFERENTIAL
Basophils Absolute: 0 10*3/uL (ref 0.0–0.1)
Basophils Relative: 0 % (ref 0–1)
Eosinophils Absolute: 0 10*3/uL (ref 0.0–0.7)
Eosinophils Relative: 0 % (ref 0–5)
Lymphocytes Relative: 30 % (ref 12–46)
Lymphs Abs: 2.4 10*3/uL (ref 0.7–4.0)
Monocytes Absolute: 0.3 10*3/uL (ref 0.1–1.0)
Monocytes Relative: 4 % (ref 3–12)
Neutro Abs: 5.2 10*3/uL (ref 1.7–7.7)
Neutrophils Relative %: 66 % (ref 43–77)

## 2010-09-08 LAB — CBC
HCT: 38.1 % (ref 36.0–46.0)
HCT: 37.7 % (ref 36.0–46.0)
Hemoglobin: 12.5 g/dL (ref 12.0–15.0)
Hemoglobin: 12.1 g/dL (ref 12.0–15.0)
MCH: 27.6 pg (ref 26.0–34.0)
MCH: 27.1 pg (ref 26.0–34.0)
MCHC: 32.8 g/dL (ref 30.0–36.0)
MCHC: 32.1 g/dL (ref 30.0–36.0)
MCV: 84.1 fL (ref 78.0–100.0)
MCV: 84.5 fL (ref 78.0–100.0)
Platelets: 357 10*3/uL (ref 150–400)
Platelets: 328 10*3/uL (ref 150–400)
RBC: 4.53 MIL/uL (ref 3.87–5.11)
RBC: 4.46 MIL/uL (ref 3.87–5.11)
RDW: 15.7 % — ABNORMAL HIGH (ref 11.5–15.5)
RDW: 15.6 % — ABNORMAL HIGH (ref 11.5–15.5)
WBC: 8 10*3/uL (ref 4.0–10.5)
WBC: 10.4 10*3/uL (ref 4.0–10.5)

## 2010-09-08 LAB — GLUCOSE, CAPILLARY
Glucose-Capillary: 151 mg/dL — ABNORMAL HIGH (ref 70–99)
Glucose-Capillary: 90 mg/dL (ref 70–99)
Glucose-Capillary: 146 mg/dL — ABNORMAL HIGH (ref 70–99)

## 2010-09-08 LAB — URINALYSIS, ROUTINE W REFLEX MICROSCOPIC
Bilirubin Urine: NEGATIVE
Glucose, UA: NEGATIVE mg/dL
Hgb urine dipstick: NEGATIVE
Ketones, ur: NEGATIVE mg/dL
Nitrite: NEGATIVE
Protein, ur: NEGATIVE mg/dL
Specific Gravity, Urine: 1.009 (ref 1.005–1.030)
Urobilinogen, UA: 0.2 mg/dL (ref 0.0–1.0)
pH: 7 (ref 5.0–8.0)

## 2010-09-08 LAB — COMPREHENSIVE METABOLIC PANEL
ALT: 18 U/L (ref 0–35)
ALT: 18 U/L (ref 0–35)
AST: 21 U/L (ref 0–37)
AST: 25 U/L (ref 0–37)
Albumin: 3.7 g/dL (ref 3.5–5.2)
Albumin: 3.8 g/dL (ref 3.5–5.2)
Alkaline Phosphatase: 61 U/L (ref 39–117)
Alkaline Phosphatase: 63 U/L (ref 39–117)
BUN: 24 mg/dL — ABNORMAL HIGH (ref 6–23)
BUN: 22 mg/dL (ref 6–23)
CO2: 28 mEq/L (ref 19–32)
CO2: 28 mEq/L (ref 19–32)
Calcium: 9.6 mg/dL (ref 8.4–10.5)
Calcium: 9.1 mg/dL (ref 8.4–10.5)
Chloride: 106 mEq/L (ref 96–112)
Chloride: 101 mEq/L (ref 96–112)
Creatinine, Ser: 0.98 mg/dL (ref 0.4–1.2)
Creatinine, Ser: 1 mg/dL (ref 0.4–1.2)
GFR calc Af Amer: >60 mL/min (ref >60)
GFR calc Af Amer: >60 mL/min (ref >60)
GFR calc non Af Amer: 55 mL/min — ABNORMAL LOW (ref >60)
GFR calc non Af Amer: 54 mL/min — ABNORMAL LOW (ref >60)
Glucose, Bld: 129 mg/dL — ABNORMAL HIGH (ref 70–99)
Glucose, Bld: 137 mg/dL — ABNORMAL HIGH (ref 70–99)
Potassium: 4.4 mEq/L (ref 3.5–5.1)
Potassium: 3.7 mEq/L (ref 3.5–5.1)
Sodium: 140 mEq/L (ref 135–145)
Sodium: 135 mEq/L (ref 135–145)
Total Bilirubin: 0.4 mg/dL (ref 0.3–1.2)
Total Bilirubin: 0.6 mg/dL (ref 0.3–1.2)
Total Protein: 7 g/dL (ref 6.0–8.3)
Total Protein: 7 g/dL (ref 6.0–8.3)

## 2010-09-08 LAB — POCT CARDIAC MARKERS
CKMB, poc: 2.3 ng/mL (ref 1.0–8.0)
CKMB, poc: 2 ng/mL (ref 1.0–8.0)
Myoglobin, poc: 138 ng/mL (ref 12–200)
Myoglobin, poc: 110 ng/mL (ref 12–200)
Troponin i, poc: <0.05 ng/mL (ref 0.00–0.09)
Troponin i, poc: <0.05 ng/mL (ref 0.00–0.09)

## 2010-09-08 LAB — APTT
aPTT: 26 seconds (ref 24–37)
aPTT: 25 seconds (ref 24–37)

## 2010-09-08 LAB — CARDIAC PANEL(CRET KIN+CKTOT+MB+TROPI)
CK, MB: 2.7 ng/mL (ref 0.3–4.0)
Relative Index: 1.7 (ref 0.0–2.5)
Total CK: 157 U/L (ref 7–177)
Troponin I: 0.05 ng/mL (ref 0.00–0.06)

## 2010-09-08 LAB — CK TOTAL AND CKMB (NOT AT ARMC)
CK, MB: 2.9 ng/mL (ref 0.3–4.0)
Relative Index: 1.6 (ref 0.0–2.5)
Total CK: 178 U/L — ABNORMAL HIGH (ref 7–177)

## 2010-09-08 LAB — HEMOGLOBIN A1C
Hgb A1c MFr Bld: 6.3 % — ABNORMAL HIGH (ref <5.7)
Mean Plasma Glucose: 134 mg/dL — ABNORMAL HIGH (ref <117)

## 2010-09-08 LAB — PROTIME-INR
INR: 0.99 (ref 0.00–1.49)
INR: 0.96 (ref 0.00–1.49)
Prothrombin Time: 13.3 seconds (ref 11.6–15.2)
Prothrombin Time: 13 seconds (ref 11.6–15.2)

## 2010-09-08 LAB — TROPONIN I: Troponin I: 0.06 ng/mL (ref 0.00–0.06)

## 2010-09-09 ENCOUNTER — Other Ambulatory Visit: Payer: Self-pay | Admitting: Internal Medicine

## 2010-09-09 DIAGNOSIS — I059 Rheumatic mitral valve disease, unspecified: Secondary | ICD-10-CM

## 2010-09-09 LAB — CARDIAC PANEL(CRET KIN+CKTOT+MB+TROPI)
CK, MB: 2.2 ng/mL (ref 0.3–4.0)
CK, MB: 1.8 ng/mL (ref 0.3–4.0)
Relative Index: 1.7 (ref 0.0–2.5)
Relative Index: 1.4 (ref 0.0–2.5)
Total CK: 133 U/L (ref 7–177)
Total CK: 127 U/L (ref 7–177)
Troponin I: 0.07 ng/mL — ABNORMAL HIGH (ref 0.00–0.06)
Troponin I: 0.06 ng/mL (ref 0.00–0.06)

## 2010-09-09 LAB — GLUCOSE, CAPILLARY
Glucose-Capillary: 74 mg/dL (ref 70–99)
Glucose-Capillary: 83 mg/dL (ref 70–99)
Glucose-Capillary: 114 mg/dL — ABNORMAL HIGH (ref 70–99)

## 2010-09-09 LAB — LIPID PANEL
Cholesterol: 206 mg/dL — ABNORMAL HIGH (ref 0–200)
HDL: 51 mg/dL (ref >39)
LDL Cholesterol: 129 mg/dL — ABNORMAL HIGH (ref 0–99)
Total CHOL/HDL Ratio: 4 RATIO
Triglycerides: 129 mg/dL (ref <150)
VLDL: 26 mg/dL (ref 0–40)

## 2010-09-12 NOTE — Discharge Summary (Signed)
Kerry Barnes, Kerry Barnes           ACCOUNT NO.:  [id number]  MEDICAL RECORD NO.:  [id number]           PATIENT TYPE:  O  LOCATION:  3733                         FACILITY:  [facility]  PHYSICIAN:  Conley Canal, MD      DATE OF BIRTH:  1933-06-17  DATE OF ADMISSION:  09/08/2010 DATE OF DISCHARGE:  09/09/2010                        DISCHARGE SUMMARY - REFERRING   PRIMARY CARE PHYSICIAN:  Dr. Dorisann Frames with [facility] at [place].  CARDIOLOGIST:  Arturo Morton. Riley Kill, MD, [facility]  DISCHARGE DIAGNOSES: 1. Ischemic cerebrovascular accident involving the left posterior     frontal gyrus. 2. Hypertension, poorly controlled. 3. Diabetes mellitus type 2, hemoglobin A1c 6.3. 4. Hyperlipidemia. 5. Mild mitral regurgitation status post TEE in January 2012 with     findings of normal ejection fraction and grade 1 diastolic     dysfunction. 6. Pulmonary hypertension. 7. Seasonal allergies. 8. Gastroesophageal reflux disease.  DISCHARGE MEDICATIONS: 1. Protonix 40 mg daily. 2. Norvasc 5 mg p.o. daily. 3. Plavix 75 mg daily. 4. Asmanex inhaler 1 puff every evening. 5. Aspirin 81 mg daily. 6. Azelastine nasal spray 1 spray to each nostril twice daily. 7. Benicar 40/25 mg daily. 8. Lantus 30 units subcu daily. 9. Lipitor 40 mg at bedtime. 10.Lyrica 75 mg twice daily. 11.Meclizine 25 mg every 6 hours as needed. 12.Prednisone taper. 13.Tylenol Extra Strength 500 mg every 6 hours as needed. 14.Vicodin 5/500 mg every 6 hours as needed, no new prescription     given. 15.Vitamin C 500 mg daily. 16.Xanax 0.5 mg daily as needed for anxiety. 17.Zyrtec 10 mg daily.  PROCEDURES PERFORMED: 1. MRI of the brain without contrast on September 08, 2010, showed small     region of acute infarction affecting the left posterior frontal     gyrus with no significant swelling or hemorrhage and chronic small-     vessel disease elsewhere throughout the brain and negative     intracranial  MRA of the large and medium-sized vessels. 2. Chest x-ray on September 08, 2010, showed cardiomegaly without acute     disease. 3. A 2-D echocardiogram on September 09, 2010, showed EF 60-65%, normal     wall motion, grade 2 diastolic dysfunction, mild mitral     regurgitation, peak PA pressure of 50-54 mmHg, and a trivial     pericardial effusion. 4. Bilateral carotid duplex on September 09, 2010, did not show any     significant carotid artery stenosis.  HOSPITAL COURSE:  Kerry Barnes is a pleasant 77 year old female with comorbidities as noted above, who came to the hospital with complaints of slurred speech as well as some tingling sensation on the left upper and lower extremities.  Upon admission, the patient was found to have an ischemic CVA as described above.  Her blood pressure has been not optimally controlled in the 150s range systolic from the time of admission.  There is question of compliance with medications, although she professes compliance.  Her workup included a 2-D echocardiogram, which was unrevealing with no obvious clots.  Carotid duplex, which was also unrevealing and the MRI of the brain shown above.  She did not have  significant neurological deficits at the time of my evaluation and she was also evaluated by Physical Therapy and Speech Therapy, who felt like that she did not need further intervention from their standpoint.  I discussed with Dr. Hoy Morn, Neurology who recommended to upgrade the patient to Plavix and recommended risk factor modification.  The patient will continue her medications as outlined above and follow with her primary care physician in the next week and she can be referred to the Stroke Clinic as deemed necessary by her primary care provider.  My concern is risk for GI bleeding with prednisone, aspirin, and Plavix. She will be on Protonix.  She had been on Dexilant, which she says does not working very well for her.  She needs optimization of her  lipids and hypertension.  Today, her other labs include cardiac panel which is negative.  Total cholesterol of 206, HDL 51, LDL 129, triglycerides 129. Electrolytes unremarkable.  LFTs normal.  Coagulation profile normal. CBC normal.  Hemoglobin A1c 6.3.  The patient discharged in stable condition.  Time spent for this discharge preparation is less than 30 minutes.     Conley Canal, MD     SR/MEDQ  D:  09/09/2010  T:  09/09/2010  Job:  [id number]  cc:   Essie Christine. Riley Kill, MD, [facility] Levie Heritage, MD  Electronically Signed by Conley Canal  on 09/12/2010 02:10:04 AM

## 2010-09-20 NOTE — H&P (Signed)
NAMESIDNIE, SWALLEY NO.:  [id number]  MEDICAL RECORD NO.:  [id number]           PATIENT TYPE:  E  LOCATION:  [facility]                         FACILITY:  [facility]  PHYSICIAN:  Brendia Sacks, MD    DATE OF BIRTH:  Oct 24, 1933  DATE OF ADMISSION:  09/08/2010 DATE OF DISCHARGE:                             HISTORY & PHYSICAL   PRIMARY CARE PHYSICIAN:  Dr. Creta Levin with [facility] at [place].  CARDIOLOGIST:  Arturo Morton. Riley Kill, MD, [facility]  CHIEF COMPLAINT:  Slurred speech.  HISTORY OF PRESENT ILLNESS:  [name] is a 77 year old African American female with past medical history of type 2 diabetes, hypertension, hyperlipidemia, who presents to [facility] Emergency Department today with reports of slurred speech, left arm numbness, and frontal headache that occurred while driving this morning.  The patient states symptoms had resolved prior to arrival in the emergency department aside for a slight headache.  She denies any recent fever, cough, chest pain, shortness of breath, abdominal pain, nausea, vomiting, diarrhea. CT performed in the emergency department revealed old lacunar infarct with no acute findings.  The patient is to be admitted at this time by [facility] for further evaluation and full TIA versus CVA workup.  PAST MEDICAL HISTORY: 1. Type 2 diabetes. 2. Hypertension. 3. Hyperlipidemia. 4. Mild mitral valve prolapse. 5. Pulmonary hypertension per 2002 cardiac cath. 6. Chronic constipation. 7. Grade 1 diastolic dysfunction per TEE performed in January 2012. 8. Old lacunar infarct per CT performed, September 08, 2010 in the ED. 9. Seasonal allergies.  MEDICATIONS: 1. Prednisone 10 mg taper prescribed March 22 by the patient's     allergies. 2. Benicar HCT 40/25 p.o. daily. 3. Asmanex 220 mcg inhaled each evening. 4. Azelastine 0.1% spray b.i.d. 5. Lyrica 75 mg p.o. b.i.d. 6. Aspirin 81 mg p.o. daily. 7. Xanax 0.5 mg  p.o. daily p.r.n. anxiety. 8. Lantus insulin 30 units subcu daily. 9. Vicodin 5/500 p.o. q.6 h. p.r.n. pain. 10.Dexilant 60 mg p.o. daily 11.Lipitor 40 mg p.o. daily. 12.Meclizine 25 mg p.o. q.6 h. p.r.n.  Please note, medications need to be verified as these medications were received from the patient's pharmacy.  ALLERGIES: 1. PENICILLIN causes rash and hives. 2. The patient is intolerant to METFORMIN.  FAMILY HISTORY:  Mother deceased at age 90 with stroke.  Father deceased at age 44 with stroke.  The patient does have a brother and sister both with hypertension and diabetes.  SOCIAL HISTORY:  The patient is married.  She has 8 children.  She is retired from [employer].  No history of tobacco or EtOH use.  REVIEW OF SYSTEMS:  As stated in HPI, otherwise negative.  PHYSICAL EXAMINATION:  VITAL SIGNS:  Blood pressure 144/55, heart rate 59, respirations 16, temperature 98.4, O2 sat is 99% on room air. GENERAL:  This is an overweight African American female, sitting upright in a stretcher, in no acute distress. HEENT:  Head is normocephalic, atraumatic.  Eyes, extraocular movements are intact without scleral icterus or injection.  Ears, nose, and throat, mucous membranes are moist.  No oropharyngeal lesions noted. NECK:  Supple with no thyromegaly or lymphadenopathy.  No JVD or carotid bruits. CHEST:  With symmetrical movement, nontender to palpation. CARDIOVASCULAR:  S1, S2 regular rate and rhythm.  No murmur, rub, or gallop.  No lower extremity edema. RESPIRATORY:  Lung sounds are clear to auscultation bilaterally.  No wheezes, rales, or crackles.  No increased work of breathing. GI: Abdomen is obese, soft, nontender, nondistended with positive bowel sounds.  No appreciated masses or splenomegaly.  NEUROLOGIC:  Pupils are equal, round, reactive to light.  No facial asymmetry.  Tongue is midline.  Deep tendon reflexes symmetric throughout.  Grip strength 5/5 in  upper and lower extremities.  No pronator drift. PSYCHOLOGIC:  The patient is alert and oriented x4 with very pleasant mood and affect.  PERTINENT LABORATORY DATA AND ANCILLARY STUDIES:  White cell count 8.0, platelet count 357, hemoglobin 12.5, hematocrit 38.1.  The i- STAT shows sodium of 141, potassium 4.4, chloride 108, CO2 22, BUN 29, creatinine 1.1, serum glucose 135.  Cardiac point of cares are negative x2.  Urinalysis is unremarkable.  CT of the head showing old lacunar infarct with no acute findings.  Chest x-ray showing stable cardiomegaly with no active disease.  EKG showing sinus rhythm at 54 beats per minute.  Consider lateral ischemia.  Her last EKG available for comparison is from 2002.  ASSESSMENT AND PLAN: 1. Transient dysarthria.  Again, the patient's symptoms have resolved     upon arrival to the emergency department.  The patient's symptoms     consistent with TIA.  We will admit to hospital overnight for full     TIA versus CVA workup to include MRI, MRA of the brain, 2-D     echocardiogram and carotid studies.  We will check fasting lipid     profile in the morning to determine need for adjustment of     medications.  The patient on low-dose aspirin prior to admission.     We will increase aspirin to full dose.  The patient may need     Aggrenox versus Plavix pending further workup. 2. Type 2 diabetes.  We will continue Lantus insulin at decreased dose     to avoid hypoglycemia while inpatient.  We will order for sliding     scale insulin and check hemoglobin A1c. 3. Hypertension.  We will hold antihypertensives in the setting of     transient dysarthria and order for p.r.n. hydralazine. 4. Hyperlipidemia.  Continue the patient's statin medication and check     fasting lipid profile in the morning. 5. Prophylaxis.  We will order for subcu Lovenox for DVT prophylaxis. 6. Code status:  The patient is a full code.     Cordelia Pen,  NP   ______________________________ Brendia Sacks, MD    LE/MEDQ  D:  09/08/2010  T:  09/08/2010  Job:  [id number]  cc:   [name], MD Arturo Morton Riley Kill, MD, [facility]  Electronically Signed by Cordelia Pen NP on 09/12/2010 10:00:14 AM Electronically Signed by Brendia Sacks  on 09/20/2010 10:27:25 PM

## 2010-10-16 ENCOUNTER — Ambulatory Visit: Payer: Medicare Other | Admitting: Gastroenterology

## 2010-10-23 ENCOUNTER — Other Ambulatory Visit: Payer: Self-pay | Admitting: Gastroenterology

## 2010-10-27 ENCOUNTER — Ambulatory Visit
Admission: RE | Admit: 2010-10-27 | Discharge: 2010-10-27 | Disposition: A | Discharge status: Home / Self Care | Payer: Medicare Other | Source: Ambulatory Visit | Attending: Gastroenterology | Admitting: Gastroenterology

## 2010-10-27 MED ORDER — IOHEXOL 300 MG/ML  SOLN
100.0000 mL | Freq: Once | INTRAMUSCULAR | Status: AC | PRN
  Administered 2010-10-27: 100 mL via INTRAVENOUS

## 2010-10-28 NOTE — Assessment & Plan Note (Signed)
[facility]                         GASTROENTEROLOGY OFFICE NOTE   [name], [name]                    MRN:          [id number]  DATE:02/07/2007                            DOB:          1934-01-07    HISTORY:  [name] has an extensive note from May of this year, when  she saw Dr. Ulyess Mort before his retirement.  She now complains  of continued nocturnal acid reflux with solid food dysphagia.  She has  diverticulosis and recurrent crampy left lower quadrant pain and mild  constipation.  She has had several endoscopies and dilations, last  performed in January 2007.  She is taking Protonix 40 mg twice daily and  Amitiza 24 mcg twice daily.  Dr. Blossom Hoops previous notes in May said  that she would be placed on Reglan, but she says that she never took  this medication.  I cannot see where she has documented gastroparesis or  diabetes.  She is status post cholecystectomy.  She has had a  colonoscopy and regular enemas which otherwise have been unremarkable.  Her chart per Dr. Corinda Gubler mentions diabetes, but I cannot see where she  is on any diabetic medication.   PHYSICAL EXAMINATION:  GENERAL:  She is a talkative pleasant female,  appearing her stated age.  VITAL SIGNS:  She weighs 161 pounds, blood pressure 130/62, pulse 74 and  regular.  HEENT:  Oropharynx and throat areas unremarkable.  NECK:  I could not appreciate thyromegaly or lymphadenopathy.  CHEST:  Clear.  HEART:  No significant murmurs, gallops or rubs.  She appeared to be in  a regular rhythm.  ABDOMEN:  No hepatosplenomegaly, no abdominal masses or tenderness.  Bowel sounds normal.  EXTREMITIES:  Unremarkable.  NEUROLOGIC:  Mental status was clear.   ASSESSMENT:  1. Chronic gastroesophageal reflux disease with probable peptic      stricture of the esophagus.  2. Possible underlying gastroparesis which may need further      evaluation.  3. Chronic constipation and  diverticulosis coli.   RECOMMENDATIONS:  1. Reflux regimen reviewed with the patient.  Will continue Protonix      40 mg, 30 minutes before breakfast and supper.  2. I urged her to take Reglan 10 mg at bedtime.  3. Endoscopy and dilation.  4. Consider  gastric emptying scan.     Vania Rea. Jarold Motto, MD, [facility], FAGA  Electronically Signed    DRP/MedQ  DD: 02/07/2007  DT: 02/08/2007  Job #: [id number]

## 2010-10-28 NOTE — Assessment & Plan Note (Signed)
[facility]                             PULMONARY OFFICE NOTE   Kerry Barnes, Kerry Barnes                    MRN:          [id number]  DATE:04/06/2007                            DOB:          11-26-1933    PROBLEM LIST:  1. Asthma.  2. Allergic rhinitis.  3. Eustachian dysfunction.  4. Diabetes/insulin.  5. Esophageal Barnes.   HISTORY:  Kerry Barnes only once a month Kerry  complains that Kerry Barnes had congestion in Kerry Barnes ears, not relieved by  antibiotics.  Dr. Doristine Barnes thought that it might be an allergy issue, Kerry  has given Kerry Barnes 180.  Kerry Barnes feels mildly congested in the chest,  but ears are okay.  Occasional nasal congestion Kerry drainage, no  persistent sneezing or wheezing.  Kerry Barnes has not had reactions to Kerry Barnes  Barnes.  Kerry Barnes noting  mild mitral regurgitation Kerry mild pulmonary hypertension.  Kerry Barnes has been  followed by Dr. Jarold Barnes for GI with Barnes, possible gastroparesis,  Kerry Kerry Barnes esophageal stricture.   MEDICATIONS:  1. Diovan 160/12.5 mg.  2. Amitiza 24 mcg b.i.d.  3. Tramadol 50 mg t.i.d.  4. Protonix 40 mg b.i.d.  5. Zegerid.  6. Lipitor 40 mg.  7. Lyrica 75 mg.  8. Allergy Barnes.  9. Kerry Barnes 180.   Drug intolerant to PENICILLIN Kerry EGG.   OBJECTIVE:  Weight 161 pounds, BP 128/72, pulse 55, room air saturation  99%.  Tympanic membranes look very clear.  Nasal mucosa is not significantly  edematous.  There is no postnasal drip.  Pharynx is not red.  Voice  quality is normal.  There is no stridor.  Lung fields are clear with unlabored breathing.  Heart sounds regular without murmur.  I do not hear any increase in P2.   IMPRESSION:  1. Rhinitis with eustachian dysfunction.  2. Asthma, currently controlled.   PLAN:  1. We have refilled Kerry Barnes albuterol inhaler with discussion.  2. We are moving Kerry Barnes allergy Barnes interval back to 1 week at 1:50.  3. Flu  Barnes discussed Kerry given.  4. Schedule return in 6 months, earlier p.r.n.  Question retesting.     [name], MD, [facility], FACP  Electronically Signed    CDY/MedQ  DD: 04/06/2007  DT: 04/07/2007  Job #: [id number]   cc:   [name], [name]

## 2010-10-28 NOTE — Assessment & Plan Note (Signed)
[facility]                         GASTROENTEROLOGY OFFICE NOTE   DAJANAY, NORTHRUP                    MRN:          [id number]  DATE:05/06/2007                            DOB:          September 23, 1933    Cloe is an elderly black female who has a new complaint of left  lower quadrant pain, which really, on reviewing her chart, has been  present for many years, going back to the early 2000s.  She has had  several CT scans and pelvic and transvaginal ultrasounds, which have  shown a left ovarian cystic mass.  She has been evaluated by Dr. Kyra Manges, and apparently had a GYN checkup 3 weeks ago, and was told that  she was without gynecologic problems.  However, she continues with a  constant, dull sensation in her left lower quadrant without any real GI  or genitourinary relationships.  She is status post cholecystectomy and  last had a colonoscopy done by Dr. Corinda Gubler several years ago.  Her  recent endoscopy on February 18, 2007 she had a hiatal hernia and peptic  stricture was dilated.  She was supposed to be on Prevacid, which she is  not taking at this time.   Her health is otherwise fairly unremarkable.   She is on multiple medications, including:  1. Amitiza 24 mcg twice a day for constipation.  2. Diovan 160/12.5 mg a day.  3. Tramadol 50 mg 3 times a day.  4. Lipitor daily.  5. Lyrica 75 mg at bedtime.  6. Proventil inhalers.  7. Meloxicam 15 mg a day.   She does have a PENICILLIN allergy.  She also has had reaction in the  past to CIPRO.   EXAM:  She is a healthy-appearing black female appearing her stated age  in no acute distress.  She weighs 158 pounds, blood pressure 148/70, pulse 69 and regular.  I could not appreciate hepatosplenomegaly, abdominal masses, or  tenderness.  Bowel sounds were normal.  RECTAL:  Exam was unremarkable.  Stool was guaiac negative.   ASSESSMENT:  Mrs. Foddrell has chronic left lower quadrant  pain, which I  think is probably from an ovarian source.  I am somewhat confused about  her followup of this problem over the years.  It would seem that  laparoscopy would certainly be in order in this case.   RECOMMENDATIONS:  1. Check labs and C- 125 tumor marker.  2. Repeat abdominal-pelvic CT scan.  3. Continue all medications listed above.  4. P.r.n. Darvocet-N 100 until workup can be completed.     Vania Rea. Jarold Motto, MD, [facility], FAGA  Electronically Signed    DRP/MedQ  DD: 05/06/2007  DT: 05/06/2007  Job #: [id number]   cc:   [name], [name]

## 2010-10-28 NOTE — Assessment & Plan Note (Signed)
[facility]                         GASTROENTEROLOGY OFFICE NOTE   PARISA, PINELA                  MRN:          [id number]  DATE:06/14/2007                            DOB:          02-14-1934    The patient is doing well and only has occasional spasmodic pain in her  left lower quadrant area when she has mild constipation.  She really is  doing much better symptomatically and I have asked her to follow a high  fiber diet and take daily Citrucel.   PHYSICAL EXAMINATION:  VITAL SIGNS:  All stable.  GENERAL:  Unremarkable.  ABDOMEN:  I could not appreciate any abdominal masses or tenderness.   On reviewing the patient's chart, she really has not had any pelvic  surgery.  A CT scan on May 09, 2007, was entirely normal of her  abdomen and pelvis.   LABORATORY DATA:  Normal CA125, CBC, metabolic profile, anemia profile,  and a sed rate of 18.   RECOMMENDATIONS:  1. Continue high fiber diet and take daily Citrucel with liberal p.o.      fluids.  2. Continue Amitiza 24 mcg twice a day which has greatly helped her      constipation.  3. Continue all other medications listed and reviewed in her chart      including her Protonix which she takes because of chronic acid      reflux problems.     Vania Rea. Jarold Motto, MD, [facility], FAGA  Electronically Signed    DRP/MedQ  DD: 06/14/2007  DT: 06/14/2007  Job #: [id number]

## 2010-10-28 NOTE — Assessment & Plan Note (Signed)
[facility]                            CARDIOLOGY OFFICE NOTE   ELZORA, CULLINS                  MRN:          [id number]  DATE:09/05/2007                            DOB:          1933-09-24    REASON FOR VISIT:  Preoperative assessment.   HISTORY OF PRESENT ILLNESS:  [name] is a pleasant woman last seen  in the office back in April 2008.  She has a history of mitral  regurgitation, mild by echocardiography in 2007 with vigorous left  ventricular ejection fraction of 70-75%.  She has previously documented  normal coronary arteries at cardiac catheterization in 2002.  We have  been following her medically.  She is being considered for a D & C on  April 14. We have been asked to assess her preoperatively.  Symptom-  wise, [name] has had some trouble with chest congestion and  somewhat atypical sounding right-sided chest and left subscapular  discomfort.  She had some productive cough off and on and had a chest x-  ray done through [facility] on March 12.  This study revealed  cardiomegaly with chronic interstitial prominence, but no acute  cardiopulmonary disease.  She has not yet had a followup echocardiogram.  She had no subsequent ischemic testing since 2002.  Electrocardiogram  from the 11th showed sinus rhythm with counterclockwise rotation,  rightward axis and nondiagnostic inferior Q waves.  She also had ST-T  wave changes in V1-V3, which were more prominent in comparison to prior  tracing from years ago available the chart.   ALLERGIES:  PENICILLIN AND EGGS.   MEDICATIONS:  1. Diazepam 5 mg p.o. nightly.  2. Flonase.  3. Allegra.  4. Diovan hydrochlorothiazide 160/12.5 mg p.o. daily.  5. Amitiza  24 mcg b.i.d.  6. Protonix 40 mg p.o. b.i.d.  7. Lipitor 40 mg p.o. daily.  8. Lyrica 75 mg p.o. nightly.  9. Centrum Silver daily.  10.Meloxicam 15 mg p.o. daily.  11.Advair 250/50.  12.Metformin 1000 mg p.o.  b.i.d.   REVIEW OF SYSTEMS:  As described in History of Present Illness.  She had  no orthopnea or PND.  No lower extremity edema.   PHYSICAL EXAMINATION:  Blood pressure is 110/70, heart rate is 62,  weight is 150 pounds, which is down since our last visit.  The patient  is comfortable and in no acute distress.  HEENT:  Conjunctivae was normal.  Oropharynx is clear.  NECK: Neck is supple.  No elevated jugular venous pressure; no loud  bruits or thyromegaly is noted.  LUNGS:  Clear, somewhat diminished breath sounds.  No rales or wheezing.  CARDIAC:  Reveals a regular rate and rhythm.  Soft systolic murmur,  preserved S2.  No S3 gallop.  ABDOMEN:  Soft, nontender.  Normoactive bowel sounds.  EXTREMITIES: Show no pitting edema.  Distal pulses are 2+.  SKIN: Warm and dry.  MUSCULOSKELETAL: No kyphosis noted.  NEUROPSYCHIATRIC: The patient alert and oriented x3.  Affect is normal.   IMPRESSIONS AND RECOMMENDATIONS:  Preoperative assessment in a 77-year-  old woman with a history of mild mitral regurgitation, vigorous  left  ventricular ejection fraction and normal coronary arteries at  catheterization in 2002.  She does have diabetes mellitus, hypertension  as well as hyperlipidemia and has not had a followup ischemic assessment  recently.  I had already planned on her having a followup echocardiogram  done around this time.  Her plan will be to proceed with this in  addition to an adenosine Myoview for further risk stratification.  Assuming that there is no significant change in her mitral regurgitation  and her Myoview is overall low-risk, I will anticipate that she should  be able to proceed with her planned D&C.  Otherwise, medical therapy  will be continued.  I will plan to see back in 6 months unless we need  to proceed with further evaluation sooner.     Jonelle Sidle, MD  Electronically Signed    SGM/MedQ  DD: 09/05/2007  DT: 09/05/2007  Job #: [id number]   cc:   Gaetano Hawthorne. Lily Peer, [name]  Gloriajean Dell. Andrey Campanile, [name]

## 2010-10-31 NOTE — Assessment & Plan Note (Signed)
[name]                         GASTROENTEROLOGY OFFICE NOTE   Kerry Barnes, Kerry Barnes                    MRN:          [id number]  DATE:07/14/2006                            DOB:          December 31, 1933    The patient comes in says she has been having lots of gas in her  stomach, watery stools, some decreased lower abdominal pain and  tenderness. No rectal bleeding. Bad taste in her mouth at times. She  said she has also had some increasing GERD symptoms. Last dilatation  which was over a year. Her last colonoscopic examination was also in  January 2006 and was essentially normal. She says her bowel activity is  normal. She does not have any problems with GERD.   Her medications presently include:  1. Metformin.  2. Lipitor.  3. Diovan.  4. Prevacid.  5. Solu-tabs.  6. Amitiza.  7. Diazepam.  8. Meclizine.  9. Darvocet.  10.Botox for migraines.   PHYSICAL EXAMINATION:  She weighs 158. Blood pressure is 120/78, pulse  85 and regular.  NECK, HEART and RESPIRATORY all basically unremarkable.   IMPRESSION:  1. Gastroesophageal reflux disease.  2. Irritable bowel syndrome with increasing intestinal gas.  3. Diabetes.  4. History of allergies and asthma.  5. History of constipation, probably functional, may be related to      diabetic gastroparesis.   RECOMMENDATIONS:  Trial of Xifaxan 200 mg one b.i.d. for a week. If  helpful, she can call us and we can call in some more medication. I also  put her on some Flora-Q to take one daily. If her symptoms are improved  she is to let us know. Will see her back in followup.     Ulyess Mort, MD  Electronically Signed    SML/MedQ  DD: 07/14/2006  DT: 07/14/2006  Job #: [id number]

## 2010-10-31 NOTE — Assessment & Plan Note (Signed)
[facility]                         GASTROENTEROLOGY OFFICE NOTE   Kerry, Barnes                    MRN:          [id number]  DATE:11/01/2006                            DOB:          Jun 04, 1934    PROBLEM:  1. Chronic GERD.  2. Chronic constipation.  3. Adult-onset diabetes mellitus.   HISTORY:  Kerry Barnes comes in today for routine followup having recently  learned of Dr. Blossom Hoops upcoming retirement.  She had been on Amitiza  b.i.d. for her chronic constipation, which was working very well.  She  ran out of it and could not get any refills until return office visit.  She says, over the past 3 weeks, she has been constipated and  experiencing some left-sided abdominal pain, which she attributes to the  constipation.   The patient also has chronic GERD, has required b.i.d. Protonix for  symptom relief.  She says she had been bothered quite a bit by nighttime  symptoms, which are fairly well controlled.  She says at times she will  still have some residue of food that will regurgitate into her mouth in  the early morning hours if she eats late.  She feels that she does not  digest well generally.  She denies any dysphagia or odynophagia.   CURRENT MEDICATIONS:  1. Metformin 1000 mg b.i.d.  2. Diovan 160/12.5 daily.  3. Protonix 40 b.i.d.  4. Allergy shots weekly.  5. Multivitamin daily.  6. Diazepam 5 mg nightly.  7. Advair 100/50 daily.  8. Lipitor 40 daily.   EXAM:  Well-developed African-American female in no acute distress.  Weight 157.8, blood pressure 122/60, pulse 74.  CARDIOVASCULAR:  Regular rate and rhythm with S1 and S2.  ABDOMEN:  Soft and nontender.  There is no mass or hepatosplenomegaly.  Bowel sounds active.  RECTAL:  Not done.   IMPRESSION:  1. Chronic gastroesophageal reflux disease, controlled on high-dose      proton pump inhibitor.  2. Probable gastroparesis.  3. Chronic constipation.   PLAN:  1.  Continue Protonix 40 mg b.i.d.  Refill x1 year.  2. Add a trial of metoclopramide 5 mg nightly, refill x6 months.  3. Refill Amitiza 24 mg b.i.d.  Refill x1 year.  4. The patient to follow up with Dr. Leone Payor in 1 year or sooner      p.r.n.      Mike Gip, PA-C  Electronically Signed      Ulyess Mort, MD  Electronically Signed   AE/MedQ  DD: 11/02/2006  DT: 11/02/2006  Job #: [id number]

## 2010-10-31 NOTE — Cardiovascular Report (Signed)
[place]  Patient:    Kerry Barnes, Kerry Barnes                  MRN: [id number] Proc. Date: 10/08/00 Adm. Date:  [id number] Disc. Date: [id number] Attending:  Ronaldo Miyamoto CC:         Erasmo Downer, [name]  Lewayne Bunting, [name]  CV Laboratory   Cardiac Catheterization  INDICATIONS:  The patient is a delightful, 77 year old, African-American female, who presents with progressive shortness of breath.  She has had a long history of a murmur.  However, by echocardiography, it was suggested that she have mild mitral regurgitation.  After further evaluation, it was felt that we should go ahead and do left heart catheterization.  She was brought to the catheterization lab after informed consent was obtained by Drs. DeGent and Roxanne Mins.  PROCEDURES: 1. Left and right heart catheterization. 2. Selective coronary arteriography. 3. Selective left ventriculography.  DESCRIPTION OF PROCEDURE:  The procedure was performed from the right femoral artery using 6 French catheters.  She tolerated the procedure well and there were no complications.  Because of the mitral regurgitation and normal coronary arteries, it was felt that right heart catheterization would be valuable in assessing her condition.  We therefore made preparations for right heart catheterization and an 8 French sheath was placed in the right femoral vein.  We did given 1000 units of intravenous heparin.  A Swan-Ganz catheter was then passed to the superior vena cava where saturation was obtained. Sequential right heart pressures were then obtained.  Following this, thermodilution cardiac outputs were performed.  Saturations were obtained for Fick cardiac output determination.  The right heart catheter was then pulled back during pressure recording.  There were no complications from the procedure.  She was taken to the holding area in satisfactory clinical condition.  HEMODYNAMIC  DATA: 1. Pressures:    a. The right atrium 13.    b. Right ventricle 48/15.    c. Pulmonary artery 44/21.    d. Pulmonary capillary wedge 20.    e. Aortic 170/76.    f. LV 161/20.    g. No gradient on pullback across the aortic valve. 2. Cardiac outputs:    a. Thermodilution 2.8/1.57.    b. Fick 3.3/1/8. 3. Saturations:    a. Superior vena cava 60%.    b. Pulmonary artery 68%.    c. AO 96%.  ANGIOGRAPHIC DATA:  VENTRICULOGRAPHY:  Ventriculography was performed in the RAO projection. Ejection fraction was calculated at 75.3%.  There appeared to be at least 2-3+ mitral regurgitation, some slight flow into the pulmonary veins was noted. Overall, LV function, however, was normal and there were no segmental wall motion abnormalities.  The left main coronary artery was free of critical disease.  The LAD coursed to the apex.  It provided one large diagonal branch and several small diagonal branches.  The LAD was free of critical disease.  The circumflex consisted of a very large bifurcating marginal branch then a smaller AV circumflex branch.  The circumflex and its branches were free of significant disease.  The right coronary artery was a dominant vessel providing the posterior descending and posterolateral system.  The right coronary artery appeared to be free of critical disease.  CONCLUSIONS: 1. Mitral regurgitation. 2. Pulmonary hypertension with elevated wedge, probably on the basis of    mitral regurgitation. 3. No significant coronary obstruction.  DISPOSITION:  The patient may need TEE.  We will keep  her overnight and plan to discharge her in the morning.  She will likely need mitral valve repair. TEE prior to this would probably be warranted. DD:  10/08/00 TD:  10/10/00 Job: [id number]

## 2010-10-31 NOTE — Assessment & Plan Note (Signed)
[facility]                              CARDIOLOGY OFFICE NOTE   Kerry Barnes, Kerry Barnes                  MRN:          [id number]  DATE:03/25/2006                            DOB:          12-19-1933    REFERRING PHYSICIAN:  Jonelle Sidle, MD   PRIMARY CARE PHYSICIAN:  [name], [name]   REASON FOR VISIT:  Establish cardiology followup.   HISTORY OF PRESENT ILLNESS:  Patient is a 77 year old woman apparently seen  in the past by Dr. Riley Kill and Dr. Andee Lineman with a history of mitral valve  disease.  I do not have complete records at this time, although note a  cardiac catheterization report from April of 2002 indicating no significant  coronary artery disease, but evidence of mitral regurgitation that was  graded as 2+ to 3+ in the setting of an ejection fraction of 75%.  There was  an element of pulmonary hypertension noted at that time as well with a  pulmonary artery systolic pressure of 44 mmHg and an increased wedge  pressure of 20 mmHg.  There was discussion in the catheterization report  regarding possible further evaluation with echocardiography, particularly  transesophageal echocardiography, but I cannot determine, at this time,  whether any further evaluation occurred or not.  In any event she has not  been followed regularly in cardiology and comes in today, stating that  actually she has been doing quite well.  She has no problems with exertional  chest pain or dyspnea on exertion beyond NYHA Class 2.  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  Her  electrocardiogram, today, shows a sinus rhythm with a single premature  ventricular complex.  There are nonspecific ST-T wave changes; and  borderline short PR interval.  She has not had followup cardiac testing of  late.   ALLERGIES:  PENICILLIN and EGGS.   PRESENT MEDICATIONS:  1. Metformin 1000 mg p.o. b.i.d.  2. Lipitor 20 mg p.o. daily.  3. Diovan/HCT 160/12.5 mg p.o. daily.  4. Prevacid 30 mg p.o. daily p.r.n.  5. Allergy shots.  6. Multivitamin 1 p.o. daily.  7. Amitiza 24 mcg p.o. b.i.d.  8. Nasonex as directed.  9. Lamisil 250 mg p.o. daily.  10.Diazepam 5 mg p.o. q.h.s.   REVIEW OF SYSTEMS:  As described in the history of present illness.  She  denies any sense of palpitations.  She does have some problems with asthma  particularly with the recent heat.  She has had no recent wheezing, however.  She has no significant lower extremity edema, orthopnea or PND.   FAMILY HISTORY REVIEWED:  Significant for stroke in the patient's mother who  died at the age of 29 and stroke in the patient's father who died at age 40.   SOCIAL HISTORY:  The patient is married.  She has 8 children.  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  She denies any tobacco or  alcohol use.   PHYSICAL EXAMINATION:  Blood pressure 130/70, heart rate 72, weight is 161  pounds.  Patient is comfortable and in no acute distress.  HEENT:  Conjunctivae and lids normal.  Oropharynx clear.  NECK:  Supple without elevated jugular venous pressure without bruits.  No  thyromegaly is noted.  LUNGS:  Generally clear without labored breathing.  CARDIAC EXAM:  Reveals a regular rate and rhythm with a very soft systolic  murmur heard towards the apex without radiation.  There is no S3 gallop.  Second heart sound is normal.  ABDOMEN:  Soft.  No bruits.  EXTREMITIES:  Show no pitting edema.  There is a scar on the anterior right  leg reportedly following prior surgery due to fracture.  Distal pulses are  2+.  SKIN:  Warm and dry.  MUSCULOSKELETAL:  No kyphosis is noted.  NEUROPSYCHIATRIC:  Patient is alert and oriented x3.  Affect is normal.   IMPRESSION AND RECOMMENDATIONS:  1. Apparent history of mitral valve disease, specifically mitral       regurgitation.  Symptomatically patient is doing quite well at this      time and has only a faint systolic murmur on examination with clear      lung fields.  At this particular point our plan will be a followup 2-D      echocardiogram to reassess cardiac structure and valvular status.  I      will plan regular follow up with her and we can determine if additional      testing is needed based on the severity of her mitral regurgitation.  2. Previously documented reassuring coronary anatomy at cardiac      catheterization in 2002.  She has not been experiencing any angina.  3. Further plans to followup.       Jonelle Sidle, MD     SGM/MedQ  DD:  03/25/2006  DT:  03/27/2006  Job #:  [id number]   cc:   [name], [name]

## 2010-10-31 NOTE — Assessment & Plan Note (Signed)
[facility]                           GASTROENTEROLOGY OFFICE NOTE   Kerry Barnes, Kerry Barnes                  MRN:          [id number]  DATE:12/23/2005                            DOB:          10-Jul-1933    Marjan comes in on December 23, 2005 and states nausea and vomiting after  taking metformin.  She continues to have a great deal of gas which give her  some lower abdominal pain, especially under the umbilicus.  She states she  is still having some irregular bowel movements with more constipation.  She  takes a stool softener without too much result.  She has diabetes and has  been taking MiraLax without much result.  Other medications beside the  metformin include Lipitor, Diovan, Peracid, Protonix and multivitamins.   PHYSICAL EXAMINATION:  VITAL SIGNS:  On physical examination, she weighed 157, blood pressure  132/62, pulse 64 and regular.   NECK:  Unremarkable.   CARDIAC:  Unremarkable.   EXTREMITIES:  Unremarkable.   IMPRESSION:  1.  History of gastroesophageal reflux disease.  2.  History of allergies and asthma.  3.  Chronic constipation with lower abdominal pain, probably functional      and/or related to diabetic gastroparesis.  4.  Diabetes.   RECOMMENDATION:  She should try Amitiza to see if this will give her any  relief; I gave her samples and told her if it helped, that she should  continue it.                                   Ulyess Mort, MD   SML/MedQ  DD:  12/23/2005  DT:  12/24/2005  Job #:  [id number]

## 2010-10-31 NOTE — Discharge Summary (Signed)
[place]  Patient:    Kerry, Barnes                  MRN: [id number] Adm. Date:  [id number] Disc. Date: [id number] Attending:  Ronaldo Miyamoto Dictator:   Delton See, P.A. CC:         [facility], [address],  [address], [place], [place] [address]  M. Linna Darner, [name], [address] [place], [place] [address]   Referring Physician Discharge [place]  DATE OF BIRTH:  09/19/1933  HISTORY ON ADMISSION:  [name] is a 77 year old female with a history of mitral valve prolapse and mitral valve regurgitation, who was recently evaluated in the office by Lewayne Bunting, [name], on September 08, 2000.  I do not have that note at this time.  Apparently she had an exercise echocardiogram on September 20, 2000, with a hypertensive blood pressure response.  There was possible ischemia.  The patient was admitted for possible cardiac catheterization.  HOSPITAL COURSE:  On October 08, 2000, the patient underwent cardiac catheterization performed by Arturo Morton. Riley Kill, [name]  Please see Dr. Louretta Shorten complete dictated report for full details.  The patient was found to have no significant coronary artery disease.  She did have 3+ MR and an ejection fraction of 75%.  Arrangements were made to discharge the patient the following day in stable condition.  LABORATORY DATA:  On the day of discharge, the patients metabolic panel was within normal limits, except for a glucose of 155.  A CBC was within normal limits.  An EKG showed sinus bradycardia and rate 53 beats per minute with no ischemic changes.  A chest x-ray on October 04, 2000, showed mild cardiomegaly with pulmonary venous hypertension, the lungs were clear, and there was some osteopenia.  DISCHARGE MEDICATIONS:  The patient was told to stay on the same medications that she was on prior to admission.  These included Lipitor 10 mg daily, baby aspirin daily, atenolol 100 mg daily, hydrochlorothiazide 25 mg  daily, Claritin 10 mg daily, urease as previously taken, prednisone 5 mg daily, Flovent and albuterol as previously used.  ACTIVITY:  The patient was told to avoid any strenuous activity or driving for two days.  DIET:  She was to be on a low-salt, low-fat diet.  WOUND CARE:  She was told to call the office if she had any increased pain, swelling, or bleeding from her groin.  FOLLOW-UP:  She was to follow up at [facility] with Lewayne Bunting, [name] She was told to call Monday for an appointment.  She was to see M. Linna Darner, [name], as needed or as scheduled.  PROBLEM LIST AT THE TIME OF DISCHARGE: 1. Status post cardiac catheterization performed on October 08, 2000, revealing    no significant coronary artery disease, an ejection fraction of 75%, and    3+ mitral regurgitation. 2. History of abnormal stress echocardiogram. 3. History of mitral valve prolapse and mitral regurgitation. 4. History of hypertension. 5. Elevated lipids. 6. Mildly elevated glucose. 7. Other past medical history as per Dr. Hurshel Party cardiology evaluation in    [place], [place], on September 08, 2000.  Note not available at this time. DD:  10/09/00 TD:  10/09/00 Job: [id number]

## 2010-10-31 NOTE — Assessment & Plan Note (Signed)
[facility]                             PULMONARY OFFICE NOTE   ARLI, BREE                    MRN:          [id number]  DATE:09/16/2006                            DOB:          05-Nov-1933    PROBLEM:  1. Asthma.  2. Allergic rhinitis.  3. Eustachian dysfunction.  4. Diabetes/insulin.  5. Esophageal reflux.   HISTORY:  This winter she had a cold leading to sustained hard coughing,  treated by her family office with an antibiotic.  Sputum is now white.  Eyes and nose are beginning to itch.  Allergy vaccine is at 1:50 on  current check.  She had dropped while sick, and we have discussed this.  She is rebuilding now.   MEDICATIONS:  1. Metformin 1000 mg b.i.d.  2. Lipitor 20 mg.  3. Diovan 160/12.5.  4. Prevacid 30 mg.  5. Protonix 40 mg.  6. Allergy vaccine.  7. Amitiza 24 mg.  8. Nasonex.  9. Terbinafine/Lamisil.  10.Diazepam 5 mg nightly.  11.Botox for migraines.   DRUG INTOLERANCE:  1. PENICILLIN.  2. EGGS.   OBJECTIVE:  Weight 158 pounds.  BP 148/64.  Pulse regular 61.  Room air  saturation 99%.  There is minimal wheeze, unlabored without dullness or stridor.  Her  nose is mildly congested.  HEART:  Sounds are regular without murmur.  No edema.   IMPRESSION:  1. Rhinitis.  2. Asthma.  3. Eustachian dysfunction.  4. Resolved viral respiratory syndrome, but beginnings of early spring      pollen.   She is missing benefit of allergy vaccine having been off for a  sustained.  We have discussed risk and benefit resumption of vaccine at  lower dose to rebuild, anaphylaxis and epinephrine again.   PLAN:  1. Claritin.  2. Rebuild vaccine to once a week.  3. Schedule return in 1 year, earlier p.r.n. as discussed.     [name], MD, [facility], FACP  Electronically Signed    CDY/MedQ  DD: 09/17/2006  DT: 09/18/2006  Job #: [id number]   cc:   [name], [name]

## 2010-10-31 NOTE — Assessment & Plan Note (Signed)
[facility]                               PULMONARY OFFICE NOTE   Kerry Barnes, Kerry Barnes                  MRN:          [id number]  DATE:04/05/2006                            DOB:          21-Mar-1934    PULMONARY/ALLERGY FOLLOWUP   PROBLEMS:  1. Asthma.  2. Allergic rhinitis.  3. Eustachian dysfunction.  4. Diabetes/insulin.   HISTORY:  A 1-year followup.  She says she has been stable, although in the  last 2 weeks she blames ragweed for increasing nasal congestion.  There is  not much sneezing or itching, but no sore throat.  Nothing purulent either.  She wants to continue allergy shots.  She gives her own at 1:10 with no  problem.  There is always somebody in the home when she is giving her shot.  We took time to review risks, benefits, and goals of allergy vaccine,  including a discussion of issues related to administration outside of a  medical office, anaphylaxis, and epinephrine.  Symptoms are perennial for  the most part, except for this recent mild flare.  For 2 days, she has had  some intermittent pain and popping in her right ear.  Hearing is normal.  There has been no wheezing or cough.   MEDICATIONS:  1. Metformin 1000 mg b.i.d.  2. Lipitor 20 mg.  3. Diovan 160/12.5.  4. Prevacid or Protonix.  5. Amitiza 24 mg.  6. Nasonex.  7. Lamisil.  8. Diazepam 5 mg at bedtime.  9. Occasional Meclizine 25 mg.  10.Botox for migraines.   DRUG INTOLERANCES:  PENICILLIN, EGGS.   OBJECTIVE:  Weight 165 pounds, BP 160/80, pulse rate is 63, room air  saturation 100%.  There is a little clear post-nasal drainage without pharyngeal erythema or  stridor.  Voice quality is normal.  Neck veins are not distended.  I find no  adenopathy.  Her nasal airway is not obstructed.  Tympanic membranes look  good bilaterally.  LUNGS:  Clear.  HEART:  Sounds regular without murmur.   IMPRESSION:  1. Asthma.  2. Allergic rhinitis.  3. Mild  intermittent eustachian dysfunction.   PLAN:  1. We refilled her Nasonex.  2. Flu shot.  3. Refilled albuterol.  4. We discussed environmental precautions and treatment choices.  5. Schedule return in 1 year, earlier p.r.n.       [name], MD, [facility], FACP      CDY/MedQ  DD:  04/05/2006  DT:  04/06/2006  Job #:  [id number]   cc:   [name], [name]

## 2010-10-31 NOTE — Assessment & Plan Note (Signed)
[facility]                            CARDIOLOGY OFFICE NOTE   LIZZA, HUFFAKER                    MRN:          [id number]  DATE:10/07/2006                            DOB:          06-13-1934    PRIMARY CARE PHYSICIAN:  [name], [name]   REASON FOR VISIT:  Cardiac follow up.   HISTORY OF PRESENT ILLNESS:  I saw [name] back in October.  Her  history is detailed in my previous note.  There were concerns in the  past about significant mitral regurgitation based on a cardiac  catheterization from April 2002.  Following her last visit, I had her  repeat an echocardiogram which demonstrated normal left ventricular  systolic function at 70-75% without regional wall motion abnormalities.  She had only mild mitral regurgitation noted at that time with mildly  dilated left atrium and mildly increased pulmonary artery systolic  pressure.  I reviewed this with her today.  Symptomatically, she states  she has had some trouble with recent flu and problems with the pollen.  She does have asthma and is followed closely by Dr. Maple Hudson, in fact, who  recently saw him earlier in the month.  She is not experiencing any  exertional chest pain.  She does feel somewhat fatigued, but has had no  marked  breathlessness with activity.  She has occasional palpitations  but no syncope.   ALLERGIES:  PENICILLIN, EGGS.   PRESENT MEDICATIONS:  1. Metformin 1000 mg p.o. b.i.d.  2. Diovan 160 mg p.o. daily.  3. Prevacid 30 mg p.o. daily.  4. Allergy shots.  5. Multivitamin daily.  6. Amitiza 24 mcg b.i.d.  7. Nasonex p.r.n.  8. Diazepam 5 mg p.o. q.h.s.  9. Advair 250/50 as directed.  10.Lipitor 40 mg p.o. daily.   REVIEW OF SYSTEMS:  As described in history of present illness.   PHYSICAL EXAMINATION:  VITAL SIGNS:  Blood pressure 149/73, heart rate  73, weight 158 pounds which is stable.  GENERAL:  The patient is comfortable and in no acute  distress.  NECK:  No elevated jugular venous pressure.  Without bruits. No  thyromegaly is noted.  LUNGS:  General clear, somewhat diminished breath sounds.  No active  wheezing.  CARDIAC:  Regular rate and rhythm.  No loud murmur.  No S3, gallops.  EXTREMITIES:  No pitting edema.   IMPRESSION/RECOMMENDATIONS:  1. Mild mitral regurgitation by follow up echocardiography.  I doubt      this is of clinical significance at this point.  Given concerns      about more significant regurgitation in the past, however, I will      arrange follow up in one years time with repeat echocardiogram.      She, otherwise, has normal ventricular systolic function.  Her mild      degree of pulmonary hypertension may be related to her pulmonary      status.  2. Otherwise, continue regular follow up with Dr. Doristine Counter for      hypertension.  It may be worth considering a calcium channel      blocker  as an addition to her Diovan if more aggressive control is      needed, as this may also help with her occasional sense of      palpitations.     Kerry Sidle, MD  Electronically Signed    SGM/MedQ  DD: 10/07/2006  DT: 10/07/2006  Job #: [id number]   cc:   [name], [name]

## 2011-02-05 ENCOUNTER — Other Ambulatory Visit (HOSPITAL_COMMUNITY): Payer: Self-pay | Admitting: Gastroenterology

## 2011-02-05 DIAGNOSIS — R110 Nausea: Secondary | ICD-10-CM

## 2011-02-24 ENCOUNTER — Encounter (HOSPITAL_COMMUNITY)
Admission: RE | Admit: 2011-02-24 | Discharge: 2011-02-24 | Disposition: A | Discharge status: Home / Self Care | Payer: Medicare Other | Source: Ambulatory Visit | Attending: Gastroenterology | Admitting: Gastroenterology

## 2011-02-24 DIAGNOSIS — R110 Nausea: Secondary | ICD-10-CM | POA: Insufficient documentation

## 2011-02-24 MED ORDER — TECHNETIUM TC 99M SULFUR COLLOID
2.0000 | Freq: Once | INTRAVENOUS | Status: AC | PRN
  Administered 2011-02-24: 2 via INTRAVENOUS

## 2011-03-20 LAB — WET PREP, GENITAL
Trich, Wet Prep: NONE SEEN
Yeast Wet Prep HPF POC: NONE SEEN

## 2011-03-20 LAB — URINALYSIS, ROUTINE W REFLEX MICROSCOPIC
Bilirubin Urine: NEGATIVE
Glucose, UA: NEGATIVE
Hgb urine dipstick: NEGATIVE
Ketones, ur: NEGATIVE
Nitrite: NEGATIVE
Protein, ur: NEGATIVE
Specific Gravity, Urine: 1.014
Urobilinogen, UA: 0.2
pH: 6.5

## 2011-03-20 LAB — URINE MICROSCOPIC-ADD ON

## 2011-03-20 LAB — GC/CHLAMYDIA PROBE AMP, GENITAL
Chlamydia, DNA Probe: NEGATIVE
GC Probe Amp, Genital: NEGATIVE

## 2011-03-20 LAB — URINE CULTURE
Colony Count: NO GROWTH
Culture: NO GROWTH

## 2011-06-16 DIAGNOSIS — I639 Cerebral infarction, unspecified: Secondary | ICD-10-CM

## 2011-06-16 HISTORY — DX: Cerebral infarction, unspecified: I63.9

## 2011-07-23 ENCOUNTER — Telehealth: Payer: Self-pay | Admitting: Cardiology

## 2011-07-23 NOTE — Telephone Encounter (Signed)
All Cardiac faxed to [facility] @ [phone] 07/23/11/KM

## 2011-08-17 ENCOUNTER — Encounter: Payer: Self-pay | Admitting: Internal Medicine

## 2011-08-28 ENCOUNTER — Encounter: Payer: Self-pay | Admitting: Internal Medicine

## 2011-09-02 ENCOUNTER — Encounter: Payer: Self-pay | Admitting: Internal Medicine

## 2011-09-02 ENCOUNTER — Ambulatory Visit (INDEPENDENT_AMBULATORY_CARE_PROVIDER_SITE_OTHER): Payer: Medicare Other | Admitting: Internal Medicine

## 2011-09-02 VITALS — BP 104/58 | HR 64 | Ht 62.5 in | Wt 158.4 lb

## 2011-09-02 DIAGNOSIS — K589 Irritable bowel syndrome without diarrhea: Secondary | ICD-10-CM

## 2011-09-02 DIAGNOSIS — R143 Flatulence: Secondary | ICD-10-CM

## 2011-09-02 DIAGNOSIS — R140 Abdominal distension (gaseous): Secondary | ICD-10-CM

## 2011-09-02 DIAGNOSIS — R141 Gas pain: Secondary | ICD-10-CM

## 2011-09-02 MED ORDER — LINACLOTIDE 290 MCG PO CAPS: 1.0000 | ORAL_CAPSULE | Freq: Every day | ORAL | Status: DC

## 2011-09-02 NOTE — Progress Notes (Signed)
Subjective:    Patient ID: Kerry Barnes, female    DOB: 1934/03/22, 78 y.o.   MRN: [id number]  HPI Kerry Barnes is a 78 yo female with PMH of IBS with constipation, GERD, HTN, fibromyalgia, DM who is seen for evaluation of constipation and abd bloating. The patient was seen years ago by Dr. Corinda Gubler and Dr. Jarold Motto, and most recently by Dr. Loreta Ave (GI, [facility]).  She reports a long-standing history of constipation, associated with abdominal bloating and occasional abdominal discomfort. This has been going on for years. She reports having tried previous laxatives, probiotics, and diet modifications along with drinking plenty of water without benefit. She did take lubiprostone for some time and had relief, but can no longer afford this from an insurance standpoint.  She reports bowel movements about once a week. No blood in her stool, no melena. She does report history of intermittent heartburn, but is not taking medication for this currently. This is not a frequent problem for her now. No dysphagia or odynophagia. No nausea or vomiting. Appetite is good, but occasionally abdominal bloating makes her not as hungry.  He is taking probiotic, and tried Librarian, academic for many months. She is now taking a new product called UltraFlora. Occasionally she uses Epsom salts with women's use to induce bowel movement. No fevers or chills. No weight loss. She has recently been seeking an irritable bowel study to help with her symptoms. She is convinced that her constipation worsened after cholecystectomy, years ago.  Review of Systems As per history of present illness, plus positive for arthritis, back pain, fatigue, occasional headaches, sleeping difficulty, occasional urinary leakage  Past Medical History  Diagnosis Date  . Hypertension   . Diabetes mellitus   . Fibromyalgia   . Asthma   . Hyperlipemia   . IBS (irritable bowel syndrome)    Past Surgical History  Procedure Date  . Cholecystectomy     Current Outpatient Prescriptions  Medication Sig Dispense Refill  . acetaminophen (TYLENOL) 325 MG tablet Take 650 mg by mouth every 6 (six) hours as needed.      Marland Kitchen amLODipine (NORVASC) 5 MG tablet Take 5 mg by mouth daily.      Marland Kitchen aspirin 81 MG tablet Take 81 mg by mouth daily.      Marland Kitchen atorvastatin (LIPITOR) 40 MG tablet Take 40 mg by mouth daily.      . cetirizine (ZYRTEC) 10 MG tablet Take 10 mg by mouth daily.      . clopidogrel (PLAVIX) 75 MG tablet Take 75 mg by mouth daily.      . fluticasone (VERAMYST) 27.5 MCG/SPRAY nasal spray Place 2 sprays into the nose daily.      . Fluticasone-Salmeterol (ADVAIR) 100-50 MCG/DOSE AEPB Inhale 1 puff into the lungs every 12 (twelve) hours.      . insulin glargine (LANTUS) 100 UNIT/ML injection Inject 30 Units into the skin at bedtime.      . meclizine (ANTIVERT) 25 MG tablet Take 25 mg by mouth 3 (three) times daily as needed.      . Omega-3 Fatty Acids (FISH OIL) 1200 MG CAPS Take 2 capsules by mouth daily.      . pregabalin (LYRICA) 75 MG capsule Take 75 mg by mouth 2 (two) times daily.      Marland Kitchen PROBIOTIC CAPS Take 1 capsule by mouth daily.      . Probiotic Product (ALIGN) 4 MG CAPS Take 1 capsule by mouth daily.      . vitamin  C (ASCORBIC ACID) 500 MG tablet Take 500 mg by mouth daily.      . Linaclotide (LINZESS) 290 MCG CAPS Take 1 capsule by mouth daily.  30 capsule  3   Allergies  Allergen Reactions  . Amoxicillin     REACTION: unspecified  . Ciprofloxacin   . Eggs Or Egg-Derived Products   . Penicillins    Family History  Problem Relation Age of Onset  . Diabetes Mother   . Diabetes Father   . Diabetes Sister   . Diabetes Brother   . Colon cancer Neg Hx    Social History  . Marital Status: Married    Number of Children: 8   Occupational History  . Housewife    Social History Main Topics  . Smoking status: Never Smoker   . Smokeless tobacco: Never Used  . Alcohol Use: No  . Drug Use: No      Objective:   Physical  Exam BP 104/58  Pulse 64  Ht 5' 2.5" (1.588 m)  Wt 158 lb 6.4 oz (71.85 kg)  BMI 28.51 kg/m2 Constitutional: Well-developed and well-nourished. No distress. HEENT: Normocephalic and atraumatic. Oropharynx is clear and moist. No oropharyngeal exudate. Conjunctivae are normal. Pupils are equal round and reactive to light. No scleral icterus. Neck: Neck supple. Trachea midline. Cardiovascular: Normal rate, regular rhythm and intact distal pulses. No M/R/G Pulmonary/chest: Effort normal and breath sounds normal. No wheezing, rales or rhonchi. Abdominal: Soft, obese, mild diffuse tenderness to deep palpation, nondistended. Bowel sounds active throughout. There are no masses palpable. No hepatosplenomegaly. Extremities: no clubbing, cyanosis, or edema Lymphadenopathy: No cervical adenopathy noted. Neurological: Alert and oriented to person place and time. Skin: Skin is warm and dry. No rashes noted. Psychiatric: Normal mood and affect. Behavior is normal.  Prior Imaging   CT ABDOMEN AND PELVIS WITH CONTRAST -- May 2012   Technique:  Multidetector CT imaging of the abdomen and pelvis was performed following the standard protocol during bolus administration of intravenous contrast.   Contrast: 100 ml Omnipaque-300   Comparison: CT abdomen pelvis of 05/09/2007   Findings: The lung bases are clear.  The liver enhances with no focal abnormality.  No ductal dilatation is seen.  Surgical clips are present from prior cholecystectomy.  The pancreas is normal in size and the pancreatic duct is not dilated.  The adrenal glands and spleen are unremarkable.  The stomach is moderately distended and unremarkable.  The kidneys enhance no calculus and no hydronephrosis or mass is seen.  The abdominal aorta is normal in caliber with mild atheromatous change present.  No adenopathy is seen.   The uterus is normal in size.  A small amount of air is present which appears to be within the vagina of  questionable significance. No abnormality of the colon is seen.  No diverticulitis is noted. The terminal ileum is well seen and appears normal.  The appendix is anteriorly positioned with no abnormality noted.  There is degenerative disc disease particularly at L5-S1 level.   IMPRESSION:   1.  No acute abnormality on CT of the abdomen and pelvis.  No diverticulitis is seen. 2.  The appendix and terminal ileum are unremarkable. 3.  Degenerative disc disease especially at L5-S1.   NUCLEAR MEDICINE GASTRIC EMPTYING SCAN -- Sept 2012   Technique:  After oral ingestion of radiolabeled meal, sequential abdominal images were obtained for 120 minutes.  Residual percentage of activity remaining within the stomach was calculated at 60 and 120 minutes.  Radiopharmaceutical: 2 mCi mCi Tc-57m sulfur colloid.   Comparison:  10/27/2010   Findings: Diarrhea.  Activity at 60 minutes was 73%.  25% remained at 120 minutes.  Normal is less than 30% at 120 minutes.  Gastric emptying is normal.   IMPRESSION: Normal gastric emptying study.  Prior Procedures: EGD 02/18/2007 hiatal hernia, probable occult stricture dilated empirically with 54 French Maloney dilator. 3 cm hiatus hernia. Otherwise normal. Colonoscopy dated 06/26/2004, exam to the cecum good prep. Normal.     Assessment & Plan:   78 yo female with PMH of IBS with constipation, GERD, HTN, fibromyalgia, DM who is seen for evaluation of constipation and abd bloating.  1. IBS-C with abdominal bloating -- the patient has a long-standing history which is well-documented for irritable bowel syndrome with constipation predominance. She tolerated lubiprostone well in the past. There are no new alarm symptoms, and she is up-to-date on her screening colonoscopy. We discussed irritable bowel today, and I have recommended Linzess.  We used a 290 mcg dose, which is approved for IBS-C.  hopefully this will help with constipation and abdominal  discomfort.  Other considerations include SIBO, but this is felt less likely than irritable bowel alone.  I will see her back in 4 weeks to assess her response  2. CRC screening -- show normal colonoscopy on 06/26/2004, and she will be due repeat January 2016.

## 2011-09-02 NOTE — Patient Instructions (Signed)
We have sent the following medications to your pharmacy for you to pick up at your convenience: linzess, take 1 capsule 30 minutes before your first meal of the day.  Please follow up with Dr. Rhea Belton in 4 weeks

## 2011-09-30 ENCOUNTER — Encounter: Payer: Self-pay | Admitting: Internal Medicine

## 2011-10-01 ENCOUNTER — Encounter: Payer: Self-pay | Admitting: Internal Medicine

## 2011-10-01 ENCOUNTER — Ambulatory Visit (INDEPENDENT_AMBULATORY_CARE_PROVIDER_SITE_OTHER): Payer: Medicare Other | Admitting: Internal Medicine

## 2011-10-01 VITALS — BP 100/50 | HR 64 | Ht 62.5 in | Wt 160.4 lb

## 2011-10-01 DIAGNOSIS — Z1211 Encounter for screening for malignant neoplasm of colon: Secondary | ICD-10-CM

## 2011-10-01 DIAGNOSIS — K589 Irritable bowel syndrome without diarrhea: Secondary | ICD-10-CM

## 2011-10-01 MED ORDER — LINACLOTIDE 290 MCG PO CAPS: 1.0000 | ORAL_CAPSULE | Freq: Every day | ORAL | Status: DC

## 2011-10-01 NOTE — Progress Notes (Signed)
  Subjective:    Patient ID: Kerry Barnes, female    DOB: [birth year], 78 y.o.   MRN: [id number]  HPI Kerry Barnes is a 78 yo female with PMH of IBS with constipation, GERD, HTN, fibromyalgia, DM who is seen today in follow-up of constipation with abd bloating felt secondary to IBS with constipation predominance. At her last visit she was started on Linzess 290 mcg daily. She reports being very happy with this medication and it has helped her constipation greatly. She also reports much less abdominal pain and no further nausea. She is now having a complete and spontaneous bowel movement every other day. Prior to this medication she was having a bowel movement less than once weekly. She reports improvement in her abdominal bloating and near resolution of her lower abdominal pain. She denies diarrhea. No nausea or vomiting. She does note increased hunger, and she reports that she can get satisfied by eating. She is worried that her appetite may cause her to gain weight and then strained her lower back. She also notes that as she gains weight her glucoses are more difficult to control. No fevers or chills.  Review of Systems As per history of present illness, otherwise negative  Current Medications, Allergies, Past Medical History, Past Surgical History, Family History and Social History were reviewed in [employer] record.     Objective:   Physical Exam BP 100/50  Pulse 64  Ht 5' 2.5" (1.588 m)  Wt 160 lb 6.4 oz (72.757 kg)  BMI 28.87 kg/m2 Constitutional: Well-developed and well-nourished. No distress. HEENT: Normocephalic and atraumatic. Oropharynx is clear and moist. No oropharyngeal exudate. Conjunctivae are normal. Pupils are equal round and reactive to light. No scleral icterus. Neck: Neck supple. Trachea midline. Cardiovascular: Normal rate, regular rhythm and intact distal pulses. No M/R/G Pulmonary/chest: Effort normal and breath sounds normal. No wheezing,  rales or rhonchi. Abdominal: Soft, nontender, nondistended, obese. Bowel sounds active throughout. There are no masses palpable. No hepatosplenomegaly. Extremities: no clubbing, cyanosis, or edema Lymphadenopathy: No cervical adenopathy noted. Neurological: Alert and oriented to person place and time. Skin: Skin is warm and dry. No rashes noted. Psychiatric: Normal mood and affect. Behavior is normal.    Assessment & Plan:  78 yo female with PMH of IBS with constipation, GERD, HTN, fibromyalgia, DM who is seen in followup for IBS-C  1. IBS-C with abdominal bloating -- the patient has responded very well to Linzess 290 mcg. We discussed her increased appetite, and I'm not certain of this is related to the medication. I've asked that she try to moderate her food intake and consider light exercise as often as she can. Given her very good response, we will continue Linzess 290 mcg daily. She is given samples today, and has a prescription in place. I'll see her back in 3 months time.  2. CRC screening --  normal colonoscopy on 06/26/2004, and she will be due repeat January 2016.

## 2011-10-01 NOTE — Patient Instructions (Signed)
Continue taking Linzess daily. Follow up with Dr. Rhea Belton in 3 months

## 2011-12-09 ENCOUNTER — Encounter: Payer: Self-pay | Admitting: Internal Medicine

## 2011-12-10 ENCOUNTER — Ambulatory Visit (INDEPENDENT_AMBULATORY_CARE_PROVIDER_SITE_OTHER): Payer: Medicare Other | Admitting: Internal Medicine

## 2011-12-10 ENCOUNTER — Encounter: Payer: Self-pay | Admitting: Internal Medicine

## 2011-12-10 VITALS — BP 110/60 | HR 62 | Ht 62.0 in | Wt 159.0 lb

## 2011-12-10 DIAGNOSIS — K5904 Chronic idiopathic constipation: Secondary | ICD-10-CM

## 2011-12-10 DIAGNOSIS — K589 Irritable bowel syndrome without diarrhea: Secondary | ICD-10-CM

## 2011-12-10 DIAGNOSIS — K5909 Other constipation: Secondary | ICD-10-CM

## 2011-12-10 MED ORDER — HYOSCYAMINE SULFATE 0.125 MG SL SUBL: 0.1250 mg | SUBLINGUAL_TABLET | SUBLINGUAL | Status: DC | PRN

## 2011-12-10 MED ORDER — LINACLOTIDE 290 MCG PO CAPS: 290.0000 ug | ORAL_CAPSULE | Freq: Every day | ORAL | Status: DC

## 2011-12-10 NOTE — Progress Notes (Signed)
Subjective:    Patient ID: Kerry Barnes, female    DOB: 1933/09/30, 78 y.o.   MRN: [id number]  HPI Kerry Barnes is a 78 year old female with a PMH of IBS with constipation, GERD, hypertension, fibromyalgia, diabetes who seen today in followup of constipation with abdominal bloating felt secondary to IBS with constipation predominance. She was last seen on 10/01/2011, after having been started on Sandy. She was taken to 290 mcg dose and had a great result. Today she returns for followup alone. She reports that she was doing well on LINZESS, but it did seem to be less effective than when she initially started it. She has run out of this medication and could not afford it due to an insurance issue. She reports her insurance company would not cover it. In the interim she has tried stool softeners, prune juice, and eating prunes and other fruits. This has helped, but she has had return of her constipation symptoms. She is now having left sided abdominal pain, which she reports mostly as a cramping. She's had this pain before, and is always related to her constipation. She has not had fever or chills. She's had no rectal bleeding. No melena. Her appetite is good and she reports she stays hungry. No nausea or vomiting. She is still exercising she's been working out at [employer] on a bicycle and she's been walking recently.  Review of Systems As per history of present illness, otherwise negative  Current Medications, Allergies, Past Medical History, Past Surgical History, Family History and Social History were reviewed in [employer] record.     Objective:   Physical Exam BP 110/60  Pulse 62  Ht 5\' 2"  (1.575 m)  Wt 159 lb (72.122 kg)  BMI 29.08 kg/m2 Constitutional: Well-developed and well-nourished. No distress. HEENT: Normocephalic and atraumatic. Oropharynx is clear and moist. No oropharyngeal exudate. Conjunctivae are normal. No scleral icterus. Neck: Neck supple.  Trachea midline. Cardiovascular: Normal rate, regular rhythm and intact distal pulses. No M/R/G Pulmonary/chest: Effort normal and breath sounds normal. No wheezing, rales or rhonchi. Abdominal: Soft, nontender, mildly distended. Bowel sounds active throughout. There are no masses palpable. No hepatosplenomegaly. Extremities: no clubbing, cyanosis, or edema Lymphadenopathy: No cervical adenopathy noted. Neurological: Alert and oriented to person place and time. Skin: Skin is warm and dry. No rashes noted. Psychiatric: Normal mood and affect. Behavior is normal.  Imaging reviewed from May 2012   Clinical Data: Left lower quadrant abdominal pain for 4 months   CT ABDOMEN AND PELVIS WITH CONTRAST   Technique:  Multidetector CT imaging of the abdomen and pelvis was performed following the standard protocol during bolus administration of intravenous contrast.   Contrast: 100 ml Omnipaque-300   Comparison: CT abdomen pelvis of 05/09/2007   Findings: The lung bases are clear.  The liver enhances with no focal abnormality.  No ductal dilatation is seen.  Surgical clips are present from prior cholecystectomy.  The pancreas is normal in size and the pancreatic duct is not dilated.  The adrenal glands and spleen are unremarkable.  The stomach is moderately distended and unremarkable.  The kidneys enhance no calculus and no hydronephrosis or mass is seen.  The abdominal aorta is normal in caliber with mild atheromatous change present.  No adenopathy is seen.   The uterus is normal in size.  A small amount of air is present which appears to be within the vagina of questionable significance. No abnormality of the colon is seen.  No diverticulitis is  noted. The terminal ileum is well seen and appears normal.  The appendix is anteriorly positioned with no abnormality noted.  There is degenerative disc disease particularly at L5-S1 level.   IMPRESSION:   1.  No acute abnormality on CT of the  abdomen and pelvis.  No diverticulitis is seen. 2.  The appendix and terminal ileum are unremarkable. 3.  Degenerative disc disease especially at L5-S1.    Assessment & Plan:   78 year old female with a PMH of IBS with constipation, GERD, hypertension, fibromyalgia, diabetes who seen today in followup of constipation with abdominal bloating felt secondary to IBS with constipation predominance.  1. IBS-C with bloating -- unfortunately the LINZESS 290 mcg daily is not currently being covered by her insurance, despite the fact that it was helping her. Her abdominal pain is not new, she's had imaging for this before which is been reassuring. I expected it is somewhat related to her constipation. She is also up-to-date on her colonoscopy. For now we will give her additional samples of LINZESS 290 mcg daily and discuss with her insurance, getting this medication covered for her. She has tried lubiprostone in the past which seemed to lose its efficacy. I will give her prescription for Levsin 0.25 mg every 4-6 hours as needed for abdominal spasm when her pain is particularly intense. I encouraged her to continue to use prune juice as this is a healthy way to combat constipation. She reports she enjoys drinking prune juice. I'll see her back in 3 months time or sooner if necessary.  2. CRC screening -- normal colonoscopy on 06/26/2004, she will be due repeat January 2016

## 2011-12-10 NOTE — Patient Instructions (Addendum)
We have sent the following medications to your pharmacy for you to pick up at your convenience: Linzess, you were given samples today. Levsin; please take as directed.  Dr. Rhea Belton would like you to drink a glass of prune juice daily.  Follow up with Dr. Rhea Belton in office in 3 months

## 2011-12-11 ENCOUNTER — Telehealth: Payer: Self-pay | Admitting: Gastroenterology

## 2011-12-11 NOTE — Telephone Encounter (Signed)
Faxed form to insurance for prior auth for pt's linzess

## 2011-12-11 NOTE — Telephone Encounter (Signed)
Read previous note

## 2012-06-21 ENCOUNTER — Other Ambulatory Visit: Payer: Self-pay | Admitting: Pain Medicine

## 2012-06-21 DIAGNOSIS — M545 Low back pain, unspecified: Secondary | ICD-10-CM

## 2012-06-25 ENCOUNTER — Other Ambulatory Visit: Payer: Medicare Other

## 2012-07-06 ENCOUNTER — Ambulatory Visit
Admission: RE | Admit: 2012-07-06 | Discharge: 2012-07-06 | Disposition: A | Discharge status: Home / Self Care | Payer: Medicare Other | Source: Ambulatory Visit | Attending: Pain Medicine | Admitting: Pain Medicine

## 2012-07-06 ENCOUNTER — Other Ambulatory Visit: Payer: Self-pay | Admitting: Pain Medicine

## 2012-07-06 DIAGNOSIS — R52 Pain, unspecified: Secondary | ICD-10-CM

## 2012-07-09 ENCOUNTER — Ambulatory Visit
Admission: RE | Admit: 2012-07-09 | Discharge: 2012-07-09 | Disposition: A | Discharge status: Home / Self Care | Payer: Medicare Other | Source: Ambulatory Visit | Attending: Pain Medicine | Admitting: Pain Medicine

## 2012-07-09 DIAGNOSIS — M545 Low back pain, unspecified: Secondary | ICD-10-CM

## 2012-10-06 ENCOUNTER — Encounter: Payer: Self-pay | Admitting: Internal Medicine

## 2012-10-10 ENCOUNTER — Telehealth: Payer: Self-pay | Admitting: Gastroenterology

## 2012-10-10 ENCOUNTER — Ambulatory Visit (INDEPENDENT_AMBULATORY_CARE_PROVIDER_SITE_OTHER): Payer: Medicare Other | Admitting: Internal Medicine

## 2012-10-10 ENCOUNTER — Encounter: Payer: Self-pay | Admitting: Internal Medicine

## 2012-10-10 VITALS — BP 108/52 | HR 72 | Ht 62.5 in | Wt 159.2 lb

## 2012-10-10 DIAGNOSIS — K5900 Constipation, unspecified: Secondary | ICD-10-CM

## 2012-10-10 DIAGNOSIS — R1310 Dysphagia, unspecified: Secondary | ICD-10-CM

## 2012-10-10 DIAGNOSIS — K5909 Other constipation: Secondary | ICD-10-CM

## 2012-10-10 DIAGNOSIS — K219 Gastro-esophageal reflux disease without esophagitis: Secondary | ICD-10-CM

## 2012-10-10 MED ORDER — PANTOPRAZOLE SODIUM 40 MG PO TBEC: 40.0000 mg | DELAYED_RELEASE_TABLET | Freq: Every day | ORAL | Status: DC

## 2012-10-10 NOTE — Progress Notes (Signed)
  Subjective:    Patient ID: Kerry Barnes, female    DOB: September 05, 1933, 79 y.o.   MRN: [id number]  HPI [name] is a 79 year old female with a PMH of IBS with constipation, GERD, hypertension, fibromyalgia, diabetes who seen today in followup.  Since last being seen in June of 2013 she has developed trouble swallowing. This is specific for solid foods. She is trying to deal with this by chewing her food very well and avoiding tough foods such as meats and bread. She is having no trouble with liquids. She also notes dry mouth and occasionally morning coughing. She does endorse heartburn which occurs multiple days per week. No abdominal pain. She has continued to have intermittent constipation and the Linzess seemed to stop working.  When her prescription ran out she did not refill it. She switched to using Metamucil on a daily basis and she has found this very helpful. When she is using it she is not having significant constipation. She denies fevers or chills. No odynophagia. Good appetite.  She does recall Dr. Jarold Motto performing upper endoscopy years ago with possible dilation  Review of Systems As per history of present illness, otherwise negative  Current Medications, Allergies, Past Medical History, Past Surgical History, Family History and Social History were reviewed in [employer] electronic medical record.     Objective:   Physical Exam BP 108/52  Pulse 72  Ht 5' 2.5" (1.588 m)  Wt 159 lb 3.2 oz (72.213 kg)  BMI 28.64 kg/m2 Constitutional: Well-developed and well-nourished. No distress. HEENT: Normocephalic and atraumatic.  No scleral icterus. Cardiovascular: Normal rate, regular rhythm and intact distal pulses.  Pulmonary/chest: Effort normal and breath sounds normal. No wheezing, rales or rhonchi. Abdominal: Soft, nontender, nondistended. Bowel sounds active throughout.  Extremities: no clubbing, cyanosis, or edema Lymphadenopathy: No cervical adenopathy  noted. Neurological: Alert and oriented to person place and time. Skin: Skin is warm and dry. No rashes noted. Psychiatric: Normal mood and affect. Behavior is normal.  EGD 2008 -- Dr. Jarold Motto -- 3 cm hiatus hernia, probable occult GE junction stricture dilated with 54 [place].  No Barrett's esophagus seen     Assessment & Plan:  79 year old female with a PMH of IBS with constipation, GERD, hypertension, fibromyalgia, diabetes who seen today in followup  1.  GERD with dysphagia -- I recommended a daily PPI with pantoprazole 40 mg.  Also recommend a repeat upper endoscopy with probable dilation. Her Plavix will need to be held and we will seek permission from her primary care provider. She seemed to respond well to previous dilation, and we discussed repeating the tests including the risks and benefits. She is agreeable to proceed. For now she'll continue a soft diet and is advised to chew her food extremely well.  2.  Constipation -- Linzess was discontinued by patient after it failed to continue working.  She is getting good relief of her constipation with daily Metamucil. I recommended she continue this regimen.  3. CRC screening -- normal colonoscopy on 06/26/2004, she will be due repeat January 2016

## 2012-10-10 NOTE — Telephone Encounter (Signed)
10/10/2012    RE: Kerry Barnes DOB: 06/07/1934 MRN: [id number]   Dear Dr. Jennye Moccasin,    We have scheduled the above patient for an endoscopic procedure. Our records show that she is on anticoagulation therapy.   Please advise as to how long the patient may come off her therapy of Plavix prior to the procedure, which is scheduled for 10/18/2012.  Please fax back/ or route the completed form to [place] at [phone].   Sincerely,  Adonis Housekeeper [facility] for Dr. Carie Caddy.  Pyrtle

## 2012-10-10 NOTE — Patient Instructions (Addendum)
You have been scheduled for an endoscopy with propofol. Please follow written instructions given to you at your visit today. If you use inhalers (even only as needed), please bring them with you on the day of your procedure. Your physician has requested that you go to www.startemmi.com and enter the access code given to you at your visit today. This web site gives a general overview about your procedure. However, you should still follow specific instructions given to you by our office regarding your preparation for the procedure.   We have sent the following medications to your pharmacy for you to pick up at your convenience: Pantoprazole 40 mg   We will contact you about holding your plavix, if you do not hear from [place] by Wednesday please call us  [phone] Ext [phone]                                               We are excited to introduce MyChart, a new best-in-class service that provides you online access to important information in your electronic medical record. We want to make it easier for you to view your health information - all in one secure location - when and where you need it. We expect MyChart will enhance the quality of care and service we provide.  When you register for MyChart, you can:    View your test results.    Request appointments and receive appointment reminders via email.    Request medication renewals.    View your medical history, allergies, medications and immunizations.    Communicate with your physician's office through a password-protected site.    Conveniently print information such as your medication lists.  To find out if MyChart is right for you, please talk to a member of our clinical staff today. We will gladly answer your questions about this free health and wellness tool.  If you are age 77 or older and want a member of your family to have access to your record, you must provide written consent by completing a proxy form available at our office.  Please speak to our clinical staff about guidelines regarding accounts for patients younger than age 91.  As you activate your MyChart account and need any technical assistance, please call the MyChart technical support line at [phone]-CHART [phone]) or email your question to [email or web address]. If you email your question(s), please include your name, a return phone number and the best time to reach you.  If you have non-urgent health-related questions, you can send a message to our office through MyChart at [email or web address]. If you have a medical emergency, call 911.  Thank you for using MyChart as your new health and wellness resource!   MyChart licensed from [employer],  3086-5784. Patents Pending.

## 2012-10-12 ENCOUNTER — Telehealth: Payer: Self-pay | Admitting: Gastroenterology

## 2012-10-12 ENCOUNTER — Other Ambulatory Visit: Payer: Self-pay | Admitting: Gastroenterology

## 2012-10-12 ENCOUNTER — Encounter: Payer: Self-pay | Admitting: Internal Medicine

## 2012-10-12 NOTE — Telephone Encounter (Signed)
Faxed anti coagulation letter to Dr. Warrick Parisian   10/12/2012    RE: Kerry Barnes DOB: Nov 15, 1933 MRN: [id number]   Dear Dr. Creta Levin  We have scheduled the above patient for an endoscopic procedure. Our records show that she is on anticoagulation therapy.   Please advise as to how long the patient may come off her therapy of Plavix prior to the procedure, which is scheduled for 10/18/2012.  Please fax back/ or route the completed form to [place] at [phone].   Sincerely,  Adonis Housekeeper [facility] for Dr. Carie Caddy.  Pyrtle

## 2012-10-12 NOTE — Telephone Encounter (Signed)
Spoke to [facility] Dr. Creta Levin nurse, got the ok for a 5 day hold on pt's plavix. Called pt told her Dr. Creta Levin recommendations, pt stated understanding

## 2012-10-12 NOTE — Telephone Encounter (Signed)
Called pt's pharmacy to see who Rx'd her Plavix since I cannot find a Dr. Modena Nunnery. Dr. Warrick Parisian is the prescriber. I called her office she is with [facility]  [phone] left a message to have her or her nurse call me back regarding a hold on the plavix  ASAP since pt's procedure is scheduled for early next week

## 2012-10-18 ENCOUNTER — Other Ambulatory Visit: Payer: Self-pay | Admitting: Internal Medicine

## 2012-10-18 ENCOUNTER — Encounter: Payer: Self-pay | Admitting: Internal Medicine

## 2012-10-18 ENCOUNTER — Ambulatory Visit (AMBULATORY_SURGERY_CENTER): Payer: Medicare Other | Admitting: Internal Medicine

## 2012-10-18 VITALS — BP 126/79 | HR 67 | Temp 97.8°F | Resp 17 | Ht 62.5 in | Wt 159.0 lb

## 2012-10-18 DIAGNOSIS — R1319 Other dysphagia: Secondary | ICD-10-CM

## 2012-10-18 DIAGNOSIS — R1310 Dysphagia, unspecified: Secondary | ICD-10-CM

## 2012-10-18 DIAGNOSIS — R1314 Dysphagia, pharyngoesophageal phase: Secondary | ICD-10-CM

## 2012-10-18 DIAGNOSIS — K219 Gastro-esophageal reflux disease without esophagitis: Secondary | ICD-10-CM

## 2012-10-18 LAB — GLUCOSE, CAPILLARY
Glucose-Capillary: 85 mg/dL (ref 70–99)
Glucose-Capillary: 229 mg/dL — ABNORMAL HIGH (ref 70–99)

## 2012-10-18 MED ORDER — DEXTROSE 5 % IV SOLN: INTRAVENOUS | Status: DC

## 2012-10-18 MED ORDER — SODIUM CHLORIDE 0.9 % IV SOLN: 500.0000 mL | INTRAVENOUS | Status: DC

## 2012-10-18 NOTE — Patient Instructions (Addendum)
YOU HAD AN ENDOSCOPIC PROCEDURE TODAY AT THE [facility]: Refer to the procedure report that was given to you for any specific questions about what was found during the examination.  If the procedure report does not answer your questions, please call your gastroenterologist to clarify.  If you requested that your care partner not be given the details of your procedure findings, then the procedure report has been included in a sealed envelope for you to review at your convenience later.  YOU SHOULD EXPECT: Some feelings of bloating in the abdomen. Passage of more gas than usual.  Walking can help get rid of the air that was put into your GI tract during the procedure and reduce the bloating.   DIET: Follow dilation diet handout  ACTIVITY: Your care partner should take you home directly after the procedure.  You should plan to take it easy, moving slowly for the rest of the day.  You can resume normal activity the day after the procedure however you should NOT DRIVE or use heavy machinery for 24 hours (because of the sedation medicines used during the test).    SYMPTOMS TO REPORT IMMEDIATELY: A gastroenterologist can be reached at any hour.  During normal business hours, 8:30 AM to 5:00 PM Monday through Friday, call [phone].  After hours and on weekends, please call the GI answering service at [phone] who will take a message and have the physician on call contact you.   Following upper endoscopy (EGD)  Vomiting of blood or coffee ground material  New chest pain or pain under the shoulder blades  Painful or persistently difficult swallowing  New shortness of breath  Fever of 100F or higher  Black, tarry-looking stools  SIGNATURES/CONFIDENTIALITY: You and/or your care partner have signed paperwork which will be entered into your electronic medical record.  These signatures attest to the fact that that the information above on your After Visit Summary has been reviewed  and is understood.  Full responsibility of the confidentiality of this discharge information lies with you and/or your care-partner.  Resume Plavix today  Repeat dilation and office visit as needed

## 2012-10-18 NOTE — Progress Notes (Signed)
Patient did not have preoperative order for IV antibiotic SSI prophylaxis. (G8918)  Patient did not experience any of the following events: a burn prior to discharge; a fall within the facility; wrong site/side/patient/procedure/implant event; or a hospital transfer or hospital admission upon discharge from the facility. (G8907)  

## 2012-10-18 NOTE — Progress Notes (Signed)
Called to room to assist during endoscopic procedure.  Patient ID and intended procedure confirmed with present staff. Received instructions for my participation in the procedure from the performing physician.  

## 2012-10-18 NOTE — Op Note (Signed)
[facility] [address] [place], [address]   ENDOSCOPY PROCEDURE REPORT  PATIENT: Kerry, Barnes  MR#: [id number] BIRTHDATE: Aug 23, 1933 , 78  yrs. old GENDER: Female ENDOSCOPIST: Beverley Fiedler, MD ASSISTANT:   Threasa Beards, RN PROCEDURE DATE:  10/18/2012 PROCEDURE:   EGD with balloon dilatation ASA CLASS:   Class III INDICATIONS:dysphagia and previously dilation by Dr.  Jarold Motto with 54 Fr Savary. MEDICATIONS: These medications were titrated to patient response per physician's verbal order, Fentanyl 100 mcg IV, and Versed 5 mg IV  TOPICAL ANESTHETIC:   Cetacaine Spray  DESCRIPTION OF PROCEDURE:   After the risks benefits and alternatives of the procedure were thoroughly explained, informed consent was obtained.  The [id number]  endoscope was introduced through the mouth  and advanced to the second portion of the duodenum ,      The instrument was slowly withdrawn as the mucosa was carefully examined.   ESOPHAGUS: The mucosa of the esophagus appeared normal.   No definitive stricture was seen at the GEJ, however given response to previous dilation, dilation with balloon was again performed as below.  STOMACH: The mucosa of the stomach appeared normal.  DUODENUM: The duodenal mucosa showed no abnormalities in the bulb and second portion of the duodenum. Dilation was then performed at the gastroesophageal junction  Dilator:Balloon Size:16.5 mm and 18 mm  Resistance:minimal Heme:none Appearance:satisfactory  COMPLICATIONS: There were no complications. ENDOSCOPIC IMPRESSION: 1.   The mucosa of the esophagus appeared normal 2.   The mucosa of the stomach appeared normal 3.   The duodenal mucosa showed no abnormalities in the bulb and second portion of the duodenum  RECOMMENDATIONS: 1.  Can resume Plavix (clopidogrel today) 2.  Repeat dilation as needed 3.  Office follow-up as needed   eSigned:  Beverley Fiedler, MD 10/18/2012 11:17 AM       CC:The Patient Halina Maidens, MD

## 2012-10-19 ENCOUNTER — Telehealth: Payer: Self-pay | Admitting: *Deleted

## 2012-10-19 NOTE — Telephone Encounter (Signed)
  Follow up Call-  Call back number 10/18/2012  Post procedure Call Back phone  # [phone]  Permission to leave phone message Yes     Patient questions:  Do you have a fever, pain , or abdominal swelling? no Pain Score  0 *  Have you tolerated food without any problems? yes  Have you been able to return to your normal activities? yes  Do you have any questions about your discharge instructions: Diet   no Medications  no Follow up visit  no  Do you have questions or concerns about your Care? no  Actions: * If pain score is 4 or above: No action needed, pain <4.

## 2013-02-07 ENCOUNTER — Other Ambulatory Visit: Payer: Self-pay | Admitting: Family Medicine

## 2013-02-07 DIAGNOSIS — Z1231 Encounter for screening mammogram for malignant neoplasm of breast: Secondary | ICD-10-CM

## 2013-02-24 ENCOUNTER — Ambulatory Visit
Admission: RE | Admit: 2013-02-24 | Discharge: 2013-02-24 | Disposition: A | Discharge status: Home / Self Care | Payer: Medicare Other | Source: Ambulatory Visit | Attending: Family Medicine | Admitting: Family Medicine

## 2013-02-24 DIAGNOSIS — Z1231 Encounter for screening mammogram for malignant neoplasm of breast: Secondary | ICD-10-CM

## 2013-05-09 ENCOUNTER — Other Ambulatory Visit: Payer: Self-pay | Admitting: Family Medicine

## 2013-05-09 DIAGNOSIS — Z780 Asymptomatic menopausal state: Secondary | ICD-10-CM

## 2013-06-16 ENCOUNTER — Other Ambulatory Visit: Payer: Medicare Other

## 2013-10-10 ENCOUNTER — Ambulatory Visit (INDEPENDENT_AMBULATORY_CARE_PROVIDER_SITE_OTHER): Payer: Medicare HMO | Admitting: Cardiovascular Disease

## 2013-10-10 VITALS — BP 122/64 | HR 87 | Ht 62.5 in | Wt 161.1 lb

## 2013-10-10 DIAGNOSIS — M79606 Pain in leg, unspecified: Secondary | ICD-10-CM | POA: Insufficient documentation

## 2013-10-10 DIAGNOSIS — I5032 Chronic diastolic (congestive) heart failure: Secondary | ICD-10-CM

## 2013-10-10 DIAGNOSIS — M79609 Pain in unspecified limb: Secondary | ICD-10-CM

## 2013-10-10 DIAGNOSIS — E785 Hyperlipidemia, unspecified: Secondary | ICD-10-CM

## 2013-10-10 DIAGNOSIS — R0602 Shortness of breath: Secondary | ICD-10-CM

## 2013-10-10 DIAGNOSIS — I10 Essential (primary) hypertension: Secondary | ICD-10-CM

## 2013-10-10 NOTE — Assessment & Plan Note (Signed)
She is currently on atorvastatin. No recent labs are available.

## 2013-10-10 NOTE — Assessment & Plan Note (Signed)
Blood pressure is under excellent control on current medications. 

## 2013-10-10 NOTE — Assessment & Plan Note (Signed)
Symptoms are not convincing for true claudication. However, given prolonged history of diabetes, I recommend lower extremity arterial Doppler.

## 2013-10-10 NOTE — Assessment & Plan Note (Signed)
The patient appears to be euvolemic without diuretics. She does have symptoms of exertional dyspnea without chest discomfort. ECG is significantly abnormal with significant ST and T-wave changes in the inferior and anterolateral leads. Given her multiple risk factors for coronary artery disease, I recommend evaluation with a pharmacologic nuclear stress test. She is not able to exercise on a treadmill.

## 2013-10-10 NOTE — Progress Notes (Signed)
HPI  This is a 80 year old female who is here today for a followup visit. He use to be seen by Dr. Lia Foyer in the past. She was seen by him most recent in 2012 for chronic diastolic heart failure(with moderate pulmonary hypertension),  hypertension and mild mitral regurgitation. She has known history of hypertension, hyperlipidemia, TIA (on Plavix for that indication) as well as diabetes mellitus. Most recent echocardiogram in 2012 showed normal LV systolic function with mild mitral regurgitation. There was moderate diastolic dysfunction and pulmonary hypertension. She reports chronic symptoms of exertional dyspnea with slight worsening recently. She denies any chest discomfort. She is also having weakness in both legs when she walks with no significant pain.  Allergies  Allergen Reactions  . Amoxicillin     REACTION: unspecified  . Biaxin [Clarithromycin]   . Ciprofloxacin   . Codeine   . Eggs Or Egg-Derived Products     Had welps as a child.  Has eaten eggs recently with no problems.  . Metformin And Related   . Penicillins      Current Outpatient Prescriptions on File Prior to Visit  Medication Sig Dispense Refill  . amLODipine (NORVASC) 5 MG tablet Take 5 mg by mouth daily.      Marland Kitchen atorvastatin (LIPITOR) 40 MG tablet Take 40 mg by mouth daily.      . clopidogrel (PLAVIX) 75 MG tablet Take 75 mg by mouth daily.      . Fluticasone-Salmeterol (ADVAIR) 100-50 MCG/DOSE AEPB Inhale 1 puff into the lungs every 12 (twelve) hours.      . insulin glargine (LANTUS) 100 UNIT/ML injection Inject 30 Units into the skin at bedtime.      . meclizine (ANTIVERT) 25 MG tablet Take 25 mg by mouth 3 (three) times daily as needed.      . pantoprazole (PROTONIX) 40 MG tablet Take 1 tablet (40 mg total) by mouth daily.  90 tablet  3  . pregabalin (LYRICA) 75 MG capsule Take 75 mg by mouth 2 (two) times daily.       No current facility-administered medications on file prior to visit.     Past Medical  History  Diagnosis Date  . Hypertension   . Diabetes mellitus   . Fibromyalgia   . Asthma   . Hyperlipemia   . IBS (irritable bowel syndrome)   . Blood transfusion without reported diagnosis   . Cataract   . GERD (gastroesophageal reflux disease)   . Stroke     TIA     Past Surgical History  Procedure Laterality Date  . Cholecystectomy    . Ankle fracture surgery    . Wrist fracture surgery    . Tubal ligation       Family History  Problem Relation Age of Onset  . Diabetes Mother   . Diabetes Father   . Diabetes Sister   . Diabetes Brother   . Colon cancer Neg Hx      History   Social History  . Marital Status: Married    Spouse Name: N/A    Number of Children: 8  . Years of Education: N/A   Occupational History  . Housewife    Social History Main Topics  . Smoking status: Never Smoker   . Smokeless tobacco: Never Used  . Alcohol Use: No  . Drug Use: No  . Sexual Activity: Not on file   Other Topics Concern  . Not on file   Social History Narrative  . No  narrative on file     PHYSICAL EXAM   BP 122/64  Pulse 87  Ht 5' 2.5" (1.588 m)  Wt 161 lb 1.9 oz (73.084 kg)  BMI 28.98 kg/m2 Constitutional: She is oriented to person, place, and time. She appears well-developed and well-nourished. No distress.  HENT: No nasal discharge.  Head: Normocephalic and atraumatic.  Eyes: Pupils are equal and round. No discharge.  Neck: Normal range of motion. Neck supple. No JVD present. No thyromegaly present.  Cardiovascular: Normal rate, regular rhythm, normal heart sounds. Exam reveals no gallop and no friction rub. No murmur heard.  Pulmonary/Chest: Effort normal and breath sounds normal. No stridor. No respiratory distress. She has no wheezes. She has no rales. She exhibits no tenderness.  Abdominal: Soft. Bowel sounds are normal. She exhibits no distension. There is no tenderness. There is no rebound and no guarding.  Musculoskeletal: Normal range of  motion. She exhibits no edema and no tenderness.  Neurological: She is alert and oriented to person, place, and time. Coordination normal.  Skin: Skin is warm and dry. No rash noted. She is not diaphoretic. No erythema. No pallor.  Psychiatric: She has a normal mood and affect. Her behavior is normal. Judgment and thought content normal.  Femoral pulses are normal bilaterally. His pulses are slightly diminished on both sides.   EKG: Sinus rhythm with PACs. Significant ST and T-wave changes in the inferior and anterolateral leads suggestive of ischemia.   ASSESSMENT AND PLAN

## 2013-10-10 NOTE — Patient Instructions (Signed)
Your physician has requested that you have a lexiscan myoview. For further information please visit www.[email or web address]. Please follow instruction sheet, as given.  Your physician has requested that you have a lower extremity arterial doppler. This test is an ultrasound of the arteries in the legs. It looks at arterial blood flow in the legs. Allow one hour for Lower Arterial scans. There are no restrictions or special instructions  Your physician recommends that you continue on your current medications as directed. Please refer to the Current Medication list given to you today.  Your physician wants you to follow-up in: 6 MONTHS with Dr Arida.  You will receive a reminder letter in the mail two months in advance. If you don't receive a letter, please call our office to schedule the follow-up appointment.   

## 2013-10-19 ENCOUNTER — Encounter: Payer: Self-pay | Admitting: Physician Assistant

## 2013-10-19 ENCOUNTER — Telehealth: Payer: Self-pay | Admitting: *Deleted

## 2013-10-19 ENCOUNTER — Ambulatory Visit (INDEPENDENT_AMBULATORY_CARE_PROVIDER_SITE_OTHER): Payer: Medicare HMO | Admitting: Physician Assistant

## 2013-10-19 ENCOUNTER — Encounter: Payer: Self-pay | Admitting: Cardiology

## 2013-10-19 VITALS — BP 104/60 | HR 92 | Ht 61.42 in | Wt 161.1 lb

## 2013-10-19 DIAGNOSIS — K219 Gastro-esophageal reflux disease without esophagitis: Secondary | ICD-10-CM

## 2013-10-19 DIAGNOSIS — Z7901 Long term (current) use of anticoagulants: Secondary | ICD-10-CM

## 2013-10-19 DIAGNOSIS — R1314 Dysphagia, pharyngoesophageal phase: Secondary | ICD-10-CM

## 2013-10-19 DIAGNOSIS — R490 Dysphonia: Secondary | ICD-10-CM

## 2013-10-19 NOTE — Patient Instructions (Signed)
Stay on Protonix twice a day  You have been scheduled for an endoscopy with propofol. Please follow written instructions given to you at your visit today. If you use inhalers (even only as needed), please bring them with you on the day of your procedure. Your physician has requested that you go to www.startemmi.com and enter the access code given to you at your visit today. This web site gives a general overview about your procedure. However, you should still follow specific instructions given to you by our office regarding your preparation for the procedure.  You will be contacted by our office prior to your procedure for directions on holding your Plavix.  If you do not hear from our office 1 week prior to your scheduled procedure, please call [phone] to discuss.

## 2013-10-19 NOTE — Telephone Encounter (Signed)
LM for the patient with her son. He said she will be back soon and I can call back in 30 min.

## 2013-10-19 NOTE — Telephone Encounter (Signed)
Plavix can be held 7 days before procedure.

## 2013-10-19 NOTE — Progress Notes (Addendum)
Subjective:    Patient ID: Kerry Barnes, female    DOB: 01/02/1934, 80 y.o.   MRN: [id number]  HPI  [name] is a pleasant 80 year old African American female known to Dr. Hilarie Fredrickson. She has history of chronic GERD, IBS and recurrent dysphagia. Other medical problems include congestive heart failure prior TIA for which she is maintained on Plavix, diabetes mellitus hypertension and hyperlipidemia. Patient had undergone EGD in May of 2014 for complaints of dysphagia there was no definite stricture noted -she had a balloon dilation from 16.5-18 mm t. Patient states that the dilation helped for several months but now over the past 3 months she has had recurrent symptoms which have been somewhat progressive. He says primarily she has trouble with solids and feels as if her food is sitting and takes a long time to traverse her esophagus. No regurgitation. No abdominal pain, occasionally she has difficulty with liquids causing "strangling". She also has had an increase in heartburn and hoarseness over the past couple of weeks. She says that she is  waking up in the morning with burning in her chest. She had been taking Protonix 40 mg every morning and was just increased to twice daily earlier this week.    Review of Systems  Constitutional: Negative.   HENT: Positive for trouble swallowing and voice change.   Eyes: Negative.   Respiratory: Negative.   Cardiovascular: Negative.   Gastrointestinal: Negative.   Endocrine: Negative.   Genitourinary: Negative.   Musculoskeletal: Positive for gait problem.  Allergic/Immunologic: Negative.   Neurological: Negative.   Hematological: Negative.   Psychiatric/Behavioral: Negative.    Outpatient Prescriptions Prior to Visit  Medication Sig Dispense Refill  . albuterol (PROVENTIL HFA;VENTOLIN HFA) 108 (90 BASE) MCG/ACT inhaler Inhale into the lungs every 6 (six) hours as needed for wheezing or shortness of breath.      . ALPRAZolam (XANAX) 0.5 MG tablet  Take 0.5 mg by mouth 2 (two) times daily as needed for anxiety.      Marland Kitchen amLODipine (NORVASC) 5 MG tablet Take 5 mg by mouth daily.      Marland Kitchen atorvastatin (LIPITOR) 40 MG tablet Take 40 mg by mouth daily.      . clobetasol ointment (TEMOVATE) 3.81 % Apply 1 application topically 2 (two) times daily.      . clopidogrel (PLAVIX) 75 MG tablet Take 75 mg by mouth daily.      . cyclobenzaprine (FLEXERIL) 10 MG tablet Take 10 mg by mouth as directed.      Marland Kitchen EPINEPHrine (EPI-PEN) 0.3 mg/0.3 mL SOAJ injection Inject into the muscle once.      . Fluticasone-Salmeterol (ADVAIR) 100-50 MCG/DOSE AEPB Inhale 1 puff into the lungs every 12 (twelve) hours.      . insulin glargine (LANTUS) 100 UNIT/ML injection Inject 30 Units into the skin at bedtime.      . meclizine (ANTIVERT) 25 MG tablet Take 25 mg by mouth 3 (three) times daily as needed.      . mometasone (NASONEX) 50 MCG/ACT nasal spray Place 2 sprays into the nose daily.      Marland Kitchen olmesartan-hydrochlorothiazide (BENICAR HCT) 40-25 MG per tablet Take 1 tablet by mouth every morning.      Marland Kitchen olopatadine (PATANOL) 0.1 % ophthalmic solution 1 drop 2 (two) times daily.      . pantoprazole (PROTONIX) 40 MG tablet Take 1 tablet (40 mg total) by mouth daily.  90 tablet  3  . pregabalin (LYRICA) 75 MG capsule Take 75 mg  by mouth 2 (two) times daily.       No facility-administered medications prior to visit.   Allergies  Allergen Reactions  . Amoxicillin     REACTION: unspecified  . Biaxin [Clarithromycin]   . Ciprofloxacin   . Codeine   . Eggs Or Egg-Derived Products     Had welps as a child.  Has eaten eggs recently with no problems.  . Metformin And Related   . Penicillins    Patient Active Problem List   Diagnosis Date Noted  . Leg pain 10/10/2013  . CHEST PAIN UNSPECIFIED 03/03/2010  . Chronic diastolic heart failure 58/85/0277  . PULMONARY HYPERTENSION, SECONDARY 07/04/2009  . ARM NUMBNESS 07/04/2009  . DIABETES MELLITUS, TYPE II 07/03/2009  .  HYPERLIPIDEMIA 07/03/2009  . MITRAL REGURGITATION, MILD 07/03/2009  . HYPERTENSION, UNSPECIFIED 07/03/2009  . GASTROPARESIS 07/03/2009  . DIVERTICULOSIS, COLON, WITH HEMORRHAGE 07/03/2009  . CONSTIPATION 07/03/2009  . IBS 07/03/2009  . ARTHRITIS 07/03/2009  . MIGRAINES, HX OF 07/03/2009  . EUSTACHIAN TUBE DYSFUNCTION 04/04/2007  . ALLERGIC RHINITIS 04/04/2007  . ASTHMA 04/04/2007  . ESOPHAGITIS, REFLUX 04/04/2007   History  Substance Use Topics  . Smoking status: Never Smoker   . Smokeless tobacco: Never Used  . Alcohol Use: No      family history includes Diabetes in her brother, father, mother, and sister. There is no history of Colon cancer.  Objective:   Physical Exam  well-developed elderly African American female in no acute distress, pleasant blood pressure 104/60 pulse 92 height 5 foot 1 weight 161. HEENT; nontraumatic normocephalic EOMI PERRLA sclera anicteric, Supple ;no JVD, Cardiovascular ;regular rate and rhythm with S1-S2 no murmur or gallop, Pulmonary; clear bilaterally, Abdomen;soft nontender nondistended bowel sounds are active no palpable mass or hepatosplenomegaly, Rectal ;exam not done, Extremities; no clubbing cyanosis or edema skin warm and dry she does ambulate with a cane, Psych; mood and affect appropriate        Assessment & Plan:  #79  80 year old female with recurrent primarily solid food dysphagia. Prior EGD 1 year ago no definite stricture but positive response to empiric balloon dilation. #2 chronic GERD poorly controlled currently with heartburn and hoarseness #3 diabetes mellitus #4 history of TIA on Plavix #5 CHF-EF 6065% #6 hypertension  Plan; patient will continue Protonix 40 mg by mouth twice daily Schedule for EGD with balloon dilation with Dr. Hilarie Fredrickson procedure discussed in detail with patient and she is agreeable to proceed We will obtain consent from her cardiologist Dr. Charlie Pitter hammad for patient to hold Plavix for 5 days prior to the  procedure  Addendum: Reviewed and agree with management.  Await permission to hold plavix and if granted will proceed to EGD with likely repeat dilation Jerene Bears, MD

## 2013-10-19 NOTE — Telephone Encounter (Signed)
10/19/2013   RE: Kerry Barnes DOB: July 28, 1933 MRN: [id number]   Dear Dr. Kathlyn Sacramento,    We have scheduled the above patient for an endoscopic procedure. Our records show that she is on anticoagulation therapy.   Please advise as to how long the patient may come off her therapy of Plavix prior to the procedure, which is scheduled for 10-31-2013.  Please fax back/ or route the completed form to [place] at [phone]. Marland Kitchen   Sincerely,    Amy Esterwood PA-C    Marisue Humble CMA

## 2013-10-20 NOTE — Telephone Encounter (Signed)
I spoke to [facility], the patient, and advised her we heard from Dr. Fletcher Anon and he said she can stop the Plavix on 5-12 and stay off the medication thru the 19th . She can resume the medication on May 20. The patient verbaized understanding the instructions.

## 2013-10-24 ENCOUNTER — Encounter: Payer: Medicare HMO | Admitting: Internal Medicine

## 2013-10-25 ENCOUNTER — Ambulatory Visit (HOSPITAL_COMMUNITY): Payer: Medicare HMO | Attending: Cardiovascular Disease | Admitting: Radiology

## 2013-10-25 ENCOUNTER — Ambulatory Visit (HOSPITAL_BASED_OUTPATIENT_CLINIC_OR_DEPARTMENT_OTHER): Payer: Medicare HMO | Admitting: Cardiology

## 2013-10-25 ENCOUNTER — Encounter: Payer: Self-pay | Admitting: Internal Medicine

## 2013-10-25 VITALS — BP 125/64 | HR 71 | Ht 62.0 in | Wt 158.0 lb

## 2013-10-25 DIAGNOSIS — I739 Peripheral vascular disease, unspecified: Secondary | ICD-10-CM

## 2013-10-25 DIAGNOSIS — R0989 Other specified symptoms and signs involving the circulatory and respiratory systems: Secondary | ICD-10-CM | POA: Insufficient documentation

## 2013-10-25 DIAGNOSIS — R079 Chest pain, unspecified: Secondary | ICD-10-CM

## 2013-10-25 DIAGNOSIS — R0789 Other chest pain: Secondary | ICD-10-CM | POA: Insufficient documentation

## 2013-10-25 DIAGNOSIS — R0609 Other forms of dyspnea: Secondary | ICD-10-CM | POA: Insufficient documentation

## 2013-10-25 DIAGNOSIS — M79606 Pain in leg, unspecified: Secondary | ICD-10-CM

## 2013-10-25 DIAGNOSIS — R0602 Shortness of breath: Secondary | ICD-10-CM

## 2013-10-25 MED ORDER — TECHNETIUM TC 99M SESTAMIBI GENERIC - CARDIOLITE
11.0000 | Freq: Once | INTRAVENOUS | Status: AC | PRN
Start: 2013-10-25 — End: 2013-10-25
  Administered 2013-10-25: 11 via INTRAVENOUS

## 2013-10-25 MED ORDER — REGADENOSON 0.4 MG/5ML IV SOLN
0.4000 mg | Freq: Once | INTRAVENOUS | Status: AC
  Administered 2013-10-25: 0.4 mg via INTRAVENOUS

## 2013-10-25 MED ORDER — TECHNETIUM TC 99M SESTAMIBI GENERIC - CARDIOLITE
33.0000 | Freq: Once | INTRAVENOUS | Status: AC | PRN
  Administered 2013-10-25: 33 via INTRAVENOUS

## 2013-10-25 NOTE — Progress Notes (Signed)
Kerry Barnes [address] [place], [place] [address] [phone]    Cardiology Nuclear Med Study  Kerry Barnes is a 80 y.o. female     MRN : [id number]     DOB: 1934/02/06  Procedure Date: 10/25/2013  Nuclear Med Background Indication for Stress Test:  Evaluation for Ischemia and Abnormal EKG History:  No known CAD, Echo 0263 (diastolic HF) EF 78-58%, MPI 2009 (normal) EF 79% Cardiac Risk Factors: Hypertension, IDDM Type 2, Lipids and TIA  Symptoms:  Chest Tightness and DOE   Nuclear Pre-Procedure Caffeine/Decaff Intake:  None NPO After: 6:00am   Lungs:  clear O2 Sat: 98% on room air. IV 0.9% NS with Angio Cath:  22g  IV Site: R Hand  IV Started by:  Crissie Figures, RN  Chest Size (in):  38 Cup Size: C  Height: 5\' 2"  (1.575 m)  Weight:  158 lb (71.668 kg)  BMI:  Body mass index is 28.89 kg/(m^2). Tech Comments:  N/A    Nuclear Med Study 1 or 2 day study: 1 day  Stress Test Type:  Lexiscan  Reading MD: N/A  Order Authorizing Provider:  Kathlyn Sacramento, MD  Resting Radionuclide: Technetium 13m Sestamibi  Resting Radionuclide Dose: 11.0 mCi   Stress Radionuclide:  Technetium 58m Sestamibi  Stress Radionuclide Dose: 33.0 mCi           Stress Protocol Rest HR: 71 Stress HR: 86  Rest BP: 125/64 Stress BP: 101/66  Exercise Time (min): n/a METS: n/a           Dose of Adenosine (mg):  n/a Dose of Lexiscan: 0.4 mg  Dose of Atropine (mg): n/a Dose of Dobutamine: n/a mcg/kg/min (at max HR)  Stress Test Technologist: Glade Lloyd, BS-ES  Nuclear Technologist:  Annye Rusk, CNMT     Rest Procedure:  Myocardial perfusion imaging was performed at rest 45 minutes following the intravenous administration of Technetium 107m Sestamibi. Rest ECG: Normal sinus rhythm. Diffuse nonspecific T-wave abnormalities  Stress Procedure:  The patient received IV Lexiscan 0.4 mg over 15-seconds.  Technetium 36m Sestamibi injected at 30-seconds.  Quantitative  spect images were obtained after a 45 minute delay. Stress ECG: No significant change from baseline ECG  QPS Raw Data Images:  Normal; no motion artifact; normal heart/lung ratio. Stress Images:  Normal homogeneous uptake in all areas of the myocardium. Rest Images:  Normal homogeneous uptake in all areas of the myocardium. Subtraction (SDS):  No evidence of ischemia. Transient Ischemic Dilatation (Normal <1.22):  0.95 Lung/Heart Ratio (Normal <0.45):  0.20  Quantitative Gated Spect Images QGS EDV:  61 ml QGS ESV:  14 ml  Impression Exercise Capacity:  Lexiscan with no exercise. BP Response:  Normal blood pressure response. Clinical Symptoms:  No significant symptoms noted. ECG Impression:  No significant ST segment change suggestive of ischemia. Comparison with Prior Nuclear Study:      Compared to the report of the study from April, 2009, there is no significant change.  Overall Impression:  Normal stress nuclear study. There is no scar or ischemia. This is a low risk scan.  LV Ejection Fraction: 78%.  LV Wall Motion:  Normal Wall Motion.  Carlena Bjornstad, MD

## 2013-10-25 NOTE — Progress Notes (Signed)
Lower arterial doppler complete 

## 2013-10-31 ENCOUNTER — Encounter: Payer: Self-pay | Admitting: Internal Medicine

## 2013-10-31 ENCOUNTER — Ambulatory Visit (AMBULATORY_SURGERY_CENTER): Payer: Medicare HMO | Admitting: Internal Medicine

## 2013-10-31 VITALS — BP 113/64 | HR 69 | Temp 98.5°F | Resp 17 | Ht 61.0 in | Wt 161.0 lb

## 2013-10-31 DIAGNOSIS — R1310 Dysphagia, unspecified: Secondary | ICD-10-CM

## 2013-10-31 LAB — GLUCOSE, CAPILLARY
Glucose-Capillary: 90 mg/dL (ref 70–99)
Glucose-Capillary: 134 mg/dL — ABNORMAL HIGH (ref 70–99)
Glucose-Capillary: 66 mg/dL — ABNORMAL LOW (ref 70–99)

## 2013-10-31 MED ORDER — SODIUM CHLORIDE 0.9 % IV SOLN: 500.0000 mL | INTRAVENOUS | Status: DC

## 2013-10-31 NOTE — Progress Notes (Signed)
Called to room to assist during endoscopic procedure.  Patient ID and intended procedure confirmed with present staff. Received instructions for my participation in the procedure from the performing physician.  

## 2013-10-31 NOTE — Op Note (Signed)
[place]  [employer] [place], [id number]   ENDOSCOPY PROCEDURE REPORT  PATIENT: Kerry, Barnes  MR#: [id number] BIRTHDATE: 12-May-1934 , 67  yrs. old GENDER: Female ENDOSCOPIST: Jerene Bears, MD PROCEDURE DATE:  10/31/2013 PROCEDURE:  EGD with balloon dilation ASA CLASS:     Class III INDICATIONS:  Recurrent solid food dysphagia.   history of empiric dilation with improvement in esophageal dysphagia May 2014. MEDICATIONS: MAC sedation, administered by CRNA and Propofol (Diprivan) 80 mg IV TOPICAL ANESTHETIC: none  DESCRIPTION OF PROCEDURE: After the risks benefits and alternatives of the procedure were thoroughly explained, informed consent was obtained.  The LB [id number] endoscope was introduced through the mouth and advanced to the second portion of the duodenum. Without limitations.  The instrument was slowly withdrawn as the mucosa was fully examined.      The upper, middle and distal third of the esophagus were carefully inspected and no abnormalities were noted.  The z-line was well seen at the GEJ.  No definite stricture was seen. The endoscope was pushed into the fundus which was normal including a retroflexed view.  The antrum, gastric body, first and second part of the duodenum were unremarkable.  Retroflexed views revealed no abnormalities.    Given response to previous empiric dilation, dilation was again performed using TTS balloon to 18 mm across the GE junction.   The scope was then withdrawn from the patient and the procedure completed.  COMPLICATIONS: There were no complications.  ENDOSCOPIC IMPRESSION: Normal EGD; empiric balloon dilation to 18 mm in lower third of the esophagus across the GE junction  RECOMMENDATIONS: 1.  Continue taking your PPI (antiacid medicine) once daily.  It is best to be taken 20-30 minutes prior to breakfast meal. 2.  If improvement with dilation today, repeat dilation can be considered as  needed for recurrent esophageal dysphagia 3.  Okay to resume Plavix today at previous dose  eSigned:  Jerene Bears, MD 10/31/2013 4:06 PM   CC:The Patient; Bing Matter   (PCP)

## 2013-10-31 NOTE — Progress Notes (Signed)
Pt's blood sugar was 66 on admission.  Pt assympotomatic.  Rush Barer, RN pushed iv d50 44ml iv at Dr. Garth Schlatter orders.  Recheck sugar in 15 minutes and it was 134.  No complaints noted in recovery room.  Salome Arnt, RN went over discharge instructions in the recovery room. Maw

## 2013-10-31 NOTE — Patient Instructions (Signed)

## 2013-11-01 ENCOUNTER — Telehealth: Payer: Self-pay | Admitting: *Deleted

## 2013-11-01 NOTE — Telephone Encounter (Signed)
  Follow up Call-  Call back number 10/31/2013 10/18/2012  Post procedure Call Back phone  # [phone]  Permission to leave phone message Yes Yes     Patient questions:  Do you have a fever, pain , or abdominal swelling? no Pain Score  0 *  Have you tolerated food without any problems? yes  Have you been able to return to your normal activities? yes  Do you have any questions about your discharge instructions: Diet   no Medications  no Follow up visit  no  Do you have questions or concerns about your Care? no  Actions: * If pain score is 4 or above: No action needed, pain <4.

## 2014-02-12 ENCOUNTER — Ambulatory Visit (INDEPENDENT_AMBULATORY_CARE_PROVIDER_SITE_OTHER): Payer: Medicare HMO | Admitting: Internal Medicine

## 2014-02-12 ENCOUNTER — Encounter: Payer: Self-pay | Admitting: Internal Medicine

## 2014-02-12 VITALS — BP 112/72 | HR 68 | Ht 62.0 in | Wt 155.4 lb

## 2014-02-12 DIAGNOSIS — K589 Irritable bowel syndrome without diarrhea: Secondary | ICD-10-CM

## 2014-02-12 DIAGNOSIS — R142 Eructation: Secondary | ICD-10-CM

## 2014-02-12 DIAGNOSIS — R141 Gas pain: Secondary | ICD-10-CM

## 2014-02-12 DIAGNOSIS — R143 Flatulence: Secondary | ICD-10-CM

## 2014-02-12 DIAGNOSIS — R140 Abdominal distension (gaseous): Secondary | ICD-10-CM

## 2014-02-12 NOTE — Patient Instructions (Signed)
Follow up as needed  Your recall for your next colonoscopy is in April 2016

## 2014-02-12 NOTE — Progress Notes (Signed)
   Subjective:    Patient ID: Kerry Barnes, female    DOB: 29-Mar-1934, 80 y.o.   MRN: [id number]  HPI Kerry Barnes is a 80 year old female with a history of chronic GERD, IBS, esophageal dysphagia, history of CHF, prior TIA for which she takes chronic Plavix, diabetes, hypertension and hyperlipidemia who is seen for followup. She is here alone today. She was seen by Nicoletta Ba, PA-C in May 2015 with complaint of recurrent esophageal dysphagia. Endoscopy with balloon dilation was performed on 10/31/2013. The exam was normal but dilation was done empirically across the GE junction with an 18 mm TTS balloon. Today she reports she responded very well to this dilation and her esophageal dysphagia has resolved. She reports she is feeling very well she does occasionally have abdominal bloating which occurs after eating. This is associated with borborygmi. She reports normal bowel habits recently, though previously had dealt with constipation. She is having one bowel movement daily which are formed and brown. No diarrhea. No blood in her stool or melena. She denies recent nausea or vomiting. Reports very good appetite. She's had better control of her reflux and denies recent heartburn. She is using pantoprazole 40 mg daily. Diet has not significant change but she does note that salads seem to make her bloating worse.   Review of Systems As per history of present illness, otherwise negative  Current Medications, Allergies, Past Medical History, Past Surgical History, Family History and Social History were reviewed in [employer] record.     Objective:   Physical Exam BP 112/72  Pulse 68  Ht 5\' 2"  (1.575 m)  Wt 155 lb 6.4 oz (70.489 kg)  BMI 28.42 kg/m2 Constitutional: Well-developed and well-nourished. No distress. HEENT: Normocephalic and atraumatic. Oropharynx is clear and moist. No oropharyngeal exudate. Conjunctivae are normal.  No scleral icterus. Neck: Neck  supple. Trachea midline. Cardiovascular: Normal rate, regular rhythm and intact distal pulses. No M/R/G Pulmonary/chest: Effort normal and breath sounds normal. No wheezing, rales or rhonchi. Abdominal: Soft, nontender, nondistended. Bowel sounds active throughout. There are no masses palpable. No hepatosplenomegaly. Extremities: no clubbing, cyanosis, or edema Lymphadenopathy: No cervical adenopathy noted. Neurological: Alert and oriented to person place and time. Skin: Skin is warm and dry. No rashes noted. Psychiatric: Normal mood and affect. Behavior is normal.      Assessment & Plan:  80 year old female with a history of chronic GERD, IBS, esophageal dysphagia, history of CHF, prior TIA for which she takes chronic Plavix, diabetes, hypertension and hyperlipidemia who is seen for followup.  1. Abdominal bloating -- symptoms mild and could be secondary to dietary choices. Other possibilities include IBS, mild gastroparesis (though she has no nausea or vomiting, early satiety, making this less likely), or SIBO.  Given that the symptom is very mild I have recommended a trial of Align 1 capsule daily.  She is also given a low bloating diet handout. If symptoms persist or worsen we could consider empiric rifaximin for overgrowth. I offered followup, but she prefers to call me if symptoms fail to improve or worsen  2. CRC screening -- last colonoscopy 2006 by Dr. Velora Heckler.  She is to followup colonoscopy for screening in 2016. She is aware of this recommendation.

## 2014-04-10 ENCOUNTER — Encounter: Payer: Self-pay | Admitting: Cardiovascular Disease

## 2014-04-10 ENCOUNTER — Ambulatory Visit (INDEPENDENT_AMBULATORY_CARE_PROVIDER_SITE_OTHER): Payer: Medicare HMO | Admitting: Cardiovascular Disease

## 2014-04-10 VITALS — BP 100/54 | HR 90 | Ht 62.0 in | Wt 153.0 lb

## 2014-04-10 DIAGNOSIS — I10 Essential (primary) hypertension: Secondary | ICD-10-CM

## 2014-04-10 DIAGNOSIS — I5032 Chronic diastolic (congestive) heart failure: Secondary | ICD-10-CM

## 2014-04-10 DIAGNOSIS — M79606 Pain in leg, unspecified: Secondary | ICD-10-CM

## 2014-04-10 NOTE — Assessment & Plan Note (Signed)
Not consistent with claudication. ABI in April was normal.

## 2014-04-10 NOTE — Assessment & Plan Note (Signed)
BP is well controlled on current medications.  

## 2014-04-10 NOTE — Assessment & Plan Note (Signed)
Followed by PCP. Continue Atorvastatin with a target LDL <100

## 2014-04-10 NOTE — Patient Instructions (Signed)
Continue same medications.   Your physician wants you to follow-up in: 1 year.  You will receive a reminder letter in the mail two months in advance. If you don't receive a letter, please call our office to schedule the follow-up appointment.  

## 2014-04-10 NOTE — Assessment & Plan Note (Signed)
The patient appears to be euvolemic without diuretics. Stress test in April was normal. Continue same medications.

## 2014-04-10 NOTE — Progress Notes (Signed)
HPI  This is a 80 year old female who is here today for a followup visit. She has history of chronic diastolic heart failure(with moderate pulmonary hypertension),  hypertension and mild mitral regurgitation. She has known history of hypertension, hyperlipidemia, TIA (on Plavix for that indication) as well as diabetes mellitus. Most recent echocardiogram in 2012 showed normal LV systolic function with mild mitral regurgitation. There was moderate diastolic dysfunction and pulmonary hypertension.  She underwent a Lexiscan nuclear stress test in 09/2013 which was normal. ABI was done for atypical leg pain and was normal.  She has been doing very well and denies any new symptoms.   Allergies  Allergen Reactions  . Amoxicillin     REACTION: unspecified  . Biaxin [Clarithromycin]   . Ciprofloxacin   . Codeine   . Eggs Or Egg-Derived Products     Had welps as a child.  Has eaten eggs recently with no problems.  . Metformin And Related   . Penicillins      Current Outpatient Prescriptions on File Prior to Visit  Medication Sig Dispense Refill  . albuterol (PROVENTIL HFA;VENTOLIN HFA) 108 (90 BASE) MCG/ACT inhaler Inhale into the lungs every 6 (six) hours as needed for wheezing or shortness of breath.      . ALPRAZolam (XANAX) 0.5 MG tablet Take 0.5 mg by mouth 2 (two) times daily as needed for anxiety.      Marland Kitchen amLODipine (NORVASC) 5 MG tablet Take 5 mg by mouth daily.      Marland Kitchen atorvastatin (LIPITOR) 40 MG tablet Take 40 mg by mouth daily.      . clobetasol ointment (TEMOVATE) 1.93 % Apply 1 application topically 2 (two) times daily.      . clopidogrel (PLAVIX) 75 MG tablet Take 75 mg by mouth daily.      . cyclobenzaprine (FLEXERIL) 10 MG tablet Take 10 mg by mouth as directed.      Marland Kitchen EPINEPHrine (EPI-PEN) 0.3 mg/0.3 mL SOAJ injection Inject into the muscle once.      . Fluticasone-Salmeterol (ADVAIR) 100-50 MCG/DOSE AEPB Inhale 1 puff into the lungs every 12 (twelve) hours.      . insulin  glargine (LANTUS) 100 UNIT/ML injection Inject 30 Units into the skin at bedtime.      . meclizine (ANTIVERT) 25 MG tablet Take 25 mg by mouth 3 (three) times daily as needed.      . mometasone (NASONEX) 50 MCG/ACT nasal spray Place 2 sprays into the nose daily.      Marland Kitchen olmesartan-hydrochlorothiazide (BENICAR HCT) 40-25 MG per tablet Take 1 tablet by mouth every morning.      Marland Kitchen olopatadine (PATANOL) 0.1 % ophthalmic solution 1 drop 2 (two) times daily.      . pantoprazole (PROTONIX) 40 MG tablet Take 1 tablet (40 mg total) by mouth daily.  90 tablet  3  . pregabalin (LYRICA) 75 MG capsule Take 75 mg by mouth 2 (two) times daily.       No current facility-administered medications on file prior to visit.     Past Medical History  Diagnosis Date  . Hypertension   . Diabetes mellitus   . Fibromyalgia   . Asthma   . Hyperlipemia   . IBS (irritable bowel syndrome)   . Blood transfusion without reported diagnosis   . Cataract   . GERD (gastroesophageal reflux disease)   . Stroke     TIA     Past Surgical History  Procedure Laterality Date  . Cholecystectomy    .  Ankle fracture surgery    . Wrist fracture surgery    . Tubal ligation       Family History  Problem Relation Age of Onset  . Diabetes Mother   . Diabetes Father   . Diabetes Sister   . Diabetes Brother   . Colon cancer Neg Hx      History   Social History  . Marital Status: Married    Spouse Name: N/A    Number of Children: 8  . Years of Education: N/A   Occupational History  . Housewife    Social History Main Topics  . Smoking status: Never Smoker   . Smokeless tobacco: Never Used  . Alcohol Use: No  . Drug Use: No  . Sexual Activity: Not on file   Other Topics Concern  . Not on file   Social History Narrative  . No narrative on file     PHYSICAL EXAM   BP 100/54  Pulse 90  Ht 5\' 2"  (1.575 m)  Wt 153 lb (69.4 kg)  BMI 27.98 kg/m2 Constitutional: She is oriented to person, place, and  time. She appears well-developed and well-nourished. No distress.  HENT: No nasal discharge.  Head: Normocephalic and atraumatic.  Eyes: Pupils are equal and round. No discharge.  Neck: Normal range of motion. Neck supple. No JVD present. No thyromegaly present.  Cardiovascular: Normal rate, regular rhythm, normal heart sounds. Exam reveals no gallop and no friction rub. No murmur heard.  Pulmonary/Chest: Effort normal and breath sounds normal. No stridor. No respiratory distress. She has no wheezes. She has no rales. She exhibits no tenderness.  Abdominal: Soft. Bowel sounds are normal. She exhibits no distension. There is no tenderness. There is no rebound and no guarding.  Musculoskeletal: Normal range of motion. She exhibits no edema and no tenderness.  Neurological: She is alert and oriented to person, place, and time. Coordination normal.  Skin: Skin is warm and dry. No rash noted. She is not diaphoretic. No erythema. No pallor.  Psychiatric: She has a normal mood and affect. Her behavior is normal. Judgment and thought content normal.  Femoral pulses are normal bilaterally. His pulses are slightly diminished on both sides.      ASSESSMENT AND PLAN

## 2014-08-30 ENCOUNTER — Encounter: Payer: Self-pay | Admitting: Internal Medicine

## 2014-10-23 ENCOUNTER — Other Ambulatory Visit: Payer: Self-pay | Admitting: Family Medicine

## 2014-10-23 DIAGNOSIS — Z1231 Encounter for screening mammogram for malignant neoplasm of breast: Secondary | ICD-10-CM

## 2014-10-23 DIAGNOSIS — M8580 Other specified disorders of bone density and structure, unspecified site: Secondary | ICD-10-CM

## 2014-10-26 ENCOUNTER — Ambulatory Visit
Admission: RE | Admit: 2014-10-26 | Discharge: 2014-10-26 | Disposition: A | Discharge status: Home / Self Care | Payer: PPO | Source: Ambulatory Visit | Attending: Family Medicine | Admitting: Family Medicine

## 2014-10-26 DIAGNOSIS — Z1231 Encounter for screening mammogram for malignant neoplasm of breast: Secondary | ICD-10-CM

## 2014-10-26 DIAGNOSIS — M8580 Other specified disorders of bone density and structure, unspecified site: Secondary | ICD-10-CM

## 2014-12-19 ENCOUNTER — Encounter: Payer: Self-pay | Admitting: Internal Medicine

## 2014-12-19 ENCOUNTER — Ambulatory Visit (INDEPENDENT_AMBULATORY_CARE_PROVIDER_SITE_OTHER): Payer: PPO | Admitting: Internal Medicine

## 2014-12-19 VITALS — BP 96/54 | HR 84 | Ht 61.42 in | Wt 149.4 lb

## 2014-12-19 DIAGNOSIS — K5900 Constipation, unspecified: Secondary | ICD-10-CM | POA: Diagnosis not present

## 2014-12-19 DIAGNOSIS — R140 Abdominal distension (gaseous): Secondary | ICD-10-CM | POA: Diagnosis not present

## 2014-12-19 DIAGNOSIS — Z1211 Encounter for screening for malignant neoplasm of colon: Secondary | ICD-10-CM

## 2014-12-19 DIAGNOSIS — K589 Irritable bowel syndrome without diarrhea: Secondary | ICD-10-CM | POA: Diagnosis not present

## 2014-12-19 MED ORDER — METRONIDAZOLE 250 MG PO TABS: 250.0000 mg | ORAL_TABLET | Freq: Three times a day (TID) | ORAL | Status: DC

## 2014-12-19 MED ORDER — LINACLOTIDE 145 MCG PO CAPS: 145.0000 ug | ORAL_CAPSULE | Freq: Every day | ORAL | Status: DC

## 2014-12-19 NOTE — Patient Instructions (Signed)
We have sent in Flagyl to your pharmacy We have sent in [place] to your pharmacy, start the [place] after you complete the Flagyl Follow up in 4 months You will receive your Cologaurd kit in the mail, Please follow the instructions provided with your kit

## 2014-12-19 NOTE — Progress Notes (Signed)
   Subjective:    Patient ID: Kerry Barnes, female    DOB: Jun 12, 1934, 81 y.o.   MRN: [id number]  HPI Darnice Comrie is a 81 yo female with PMH of GERD, IBS, esophageal dysphagia, abdominal bloating who is here for follow-up. She also has a history of CHF, prior TIA for which she takes chronic Plavix, hypertension, hyperlipidemia and diabetes. She was last seen in August 2015.  She reports she overall feels well. She continues to struggle with abdominal bloating but more recently constipation has been a problem. She has had issues with constipation in the past. She is now using milk of magnesia in order to induce bowel movement. Without this it is several days between bowel movements and she notices constipation but worsening of bloating. Bloating seems to be worse in the left abdomen. She reports good appetite, no early satiety. Dysphagia symptoms improved after dilation last year. Heartburn is been well controlled on pantoprazole 40 mg daily. No weight loss. No nausea or vomiting. Blood sugars have been well controlled on insulin. She does continue on Plavix with no evidence of bleeding. She denies blood in her stool or melena. She tried probiotic without great benefit.  Review of Systems As per HPI, otherwise negative  Current Medications, Allergies, Past Medical History, Past Surgical History, Family History and Social History were reviewed in [employer] record.     Objective:   Physical Exam BP 96/54 mmHg  Pulse 84  Ht 5' 1.42" (1.56 m)  Wt 149 lb 6 oz (67.756 kg)  BMI 27.84 kg/m2 Constitutional: Well-developed and well-nourished. No distress. HEENT: Normocephalic and atraumatic. Oropharynx is clear and moist. No oropharyngeal exudate. Conjunctivae are normal.  No scleral icterus. Neck: Neck supple. Trachea midline. Cardiovascular: Normal rate, regular rhythm and intact distal pulses. No M/R/G Pulmonary/chest: Effort normal and breath sounds normal. No  wheezing, rales or rhonchi. Abdominal: Soft, nontender, nondistended. Bowel sounds active throughout. Neurological: Alert and oriented to person place and time. Skin: Skin is warm and dry.  Psychiatric: Normal mood and affect. Behavior is normal.  Colonoscopy 06/26/2004, normal.  Performed Dr. Lyla Son    Assessment & Plan:  81 yo female with PMH of GERD, IBS, esophageal dysphagia, abdominal bloating who is here for follow-up.  1. IBS with bloating and constipation -- constipation is likely worsening bloating. Probiotic was tried last year without great benefit. Will treat for bacterial overgrowth to see if this improves bloating with metronidazole 250 mg 3 times a day 1 week. After this I will have her start Linzess 145 g daily. We discussed how this should not cause diarrhea and if this occurs to notify me. She voices understanding. Low bloating diet recommended  2. CRC screening -- due this year. We discussed guidelines for colorectal cancer screening/surveillance. We also discussed these tests stop between 75 and 80 for most people. After our discussion we will proceed with Cologuard. She understands that if this is positive colonoscopy will be indicated and she would be in favor of this test should Cologuard be positive. If negative, she will likely not require further: Rectal cancer screening.  Follow-up in 3-4 months, sooner if necessary

## 2014-12-25 ENCOUNTER — Telehealth: Payer: Self-pay | Admitting: Internal Medicine

## 2014-12-25 NOTE — Telephone Encounter (Signed)
Discussed with Kerry Barnes that she can take the linzess that Dr. Hilarie Fredrickson prescribed and then complete her cologuard. Kerry Barnes aware.

## 2015-01-04 LAB — COLOGUARD: Cologuard: POSITIVE

## 2015-01-09 ENCOUNTER — Telehealth: Payer: Self-pay | Admitting: *Deleted

## 2015-01-09 NOTE — Telephone Encounter (Signed)
See below.  [name] ( from below) would like [place] to just give the approval in this note.  They need this done for tmrw Pre Op apt. Please advise.

## 2015-01-09 NOTE — Telephone Encounter (Signed)
S/w June at [place] Dunn's response to Plavix. June verbalized understanding with no further questions.

## 2015-01-09 NOTE — Telephone Encounter (Signed)
Bing Matter, PA-C with [facility] practice rx's Plavix for patient.

## 2015-01-09 NOTE — Telephone Encounter (Signed)
She is on Plavix for TIA. Please contact PCP or patient's neurologist they will have to be the one to comment on this as this is not a cardiology issue.

## 2015-01-09 NOTE — Telephone Encounter (Signed)
Patient's cologuard came back positive. Per Dr Hilarie Fredrickson, "needs colonoscopy scheduled." I have advised patient of this. She would like to have procedure completed soon. Unfortunately, the only date available within the next month is August 3rd which may make it difficult to get response from cardiology prior to appointment. However, I will tentatively go ahead and schedule patient for colonoscopy on 01/16/15 and send a note to cardiology. Patient is also scheduled for previsit tomorrow.

## 2015-01-09 NOTE — Telephone Encounter (Signed)
01/09/2015  RE: Kerry Barnes DOB: 08-06-1933 MRN: [id number]  Dear Dr Fletcher Anon,   We have scheduled the above patient for a colonoscopy procedure. Our records show that she is on anticoagulation therapy.  Please advise as to whether the patient may come off her therapy of Plavix prior to the procedure, which is scheduled for 01/16/15. Please route your response to Dixon Boos, CMA.  Sincerely,  Dixon Boos

## 2015-01-09 NOTE — Telephone Encounter (Signed)
Forward to Ryan Dunn, PA-C 

## 2015-01-10 ENCOUNTER — Ambulatory Visit (AMBULATORY_SURGERY_CENTER): Payer: Self-pay

## 2015-01-10 VITALS — Ht 60.0 in | Wt 150.0 lb

## 2015-01-10 DIAGNOSIS — Z1211 Encounter for screening for malignant neoplasm of colon: Secondary | ICD-10-CM

## 2015-01-10 MED ORDER — NA SULFATE-K SULFATE-MG SULF 17.5-3.13-1.6 GM/177ML PO SOLN: 1.0000 | Freq: Once | ORAL | Status: DC

## 2015-01-10 NOTE — Progress Notes (Signed)
PCP clearance to hold Plavix still pending.Informed patient and went ahead with PV per Carla Drape, CMA. Advised pt procedure will have to be rescheduled to October if clearance not given today. Explained to patient Carla Drape would call her by 5:00pm today and reschedule procedure and PV if clearance not given. Patient understood instructions and was okay with rescheduling if necessary.

## 2015-01-10 NOTE — Telephone Encounter (Signed)
We have received correspondence from Bing Matter, PA-C that patient may hold Plavix 5 days prior to procedure (see note under media). Patient verbalizes understanding of this.

## 2015-01-10 NOTE — Telephone Encounter (Signed)
Spoke to [employer] [phone]). She will ask that Ms.Kerry Barnes give Plavix clearance for procedure today.

## 2015-01-10 NOTE — Progress Notes (Signed)
Not on home 02 Hx of egg allergy in childhood (welps), but okay now.  No anesthesia complications Pt not taking diet or weight loss medications

## 2015-01-16 ENCOUNTER — Encounter: Payer: Self-pay | Admitting: Internal Medicine

## 2015-01-16 ENCOUNTER — Ambulatory Visit (AMBULATORY_SURGERY_CENTER): Payer: PPO | Admitting: Internal Medicine

## 2015-01-16 VITALS — BP 142/73 | HR 83 | Temp 97.9°F | Resp 20 | Ht 60.0 in | Wt 150.0 lb

## 2015-01-16 DIAGNOSIS — D120 Benign neoplasm of cecum: Secondary | ICD-10-CM | POA: Diagnosis not present

## 2015-01-16 DIAGNOSIS — R195 Other fecal abnormalities: Secondary | ICD-10-CM | POA: Diagnosis present

## 2015-01-16 DIAGNOSIS — D123 Benign neoplasm of transverse colon: Secondary | ICD-10-CM | POA: Diagnosis not present

## 2015-01-16 LAB — GLUCOSE, CAPILLARY
Glucose-Capillary: 91 mg/dL (ref 65–99)
Glucose-Capillary: 67 mg/dL (ref 65–99)

## 2015-01-16 MED ORDER — SODIUM CHLORIDE 0.9 % IV SOLN: 500.0000 mL | INTRAVENOUS | Status: DC

## 2015-01-16 NOTE — Patient Instructions (Signed)
YOU HAD AN ENDOSCOPIC PROCEDURE TODAY AT THE  ENDOSCOPY CENTER:   Refer to the procedure report that was given to you for any specific questions about what was found during the examination.  If the procedure report does not answer your questions, please call your gastroenterologist to clarify.  If you requested that your care partner not be given the details of your procedure findings, then the procedure report has been included in a sealed envelope for you to review at your convenience later.  YOU SHOULD EXPECT: Some feelings of bloating in the abdomen. Passage of more gas than usual.  Walking can help get rid of the air that was put into your GI tract during the procedure and reduce the bloating. If you had a lower endoscopy (such as a colonoscopy or flexible sigmoidoscopy) you may notice spotting of blood in your stool or on the toilet paper. If you underwent a bowel prep for your procedure, you may not have a normal bowel movement for a few days.  Please Note:  You might notice some irritation and congestion in your nose or some drainage.  This is from the oxygen used during your procedure.  There is no need for concern and it should clear up in a day or so.  SYMPTOMS TO REPORT IMMEDIATELY:   Following lower endoscopy (colonoscopy or flexible sigmoidoscopy):  Excessive amounts of blood in the stool  Significant tenderness or worsening of abdominal pains  Swelling of the abdomen that is new, acute  Fever of 100F or higher   For urgent or emergent issues, a gastroenterologist can be reached at any hour by calling [phone].   DIET: Your first meal following the procedure should be a small meal and then it is ok to progress to your normal diet. Heavy or fried foods are harder to digest and may make you feel nauseous or bloated.  Likewise, meals heavy in dairy and vegetables can increase bloating.  Drink plenty of fluids but you should avoid alcoholic beverages for 24  hours.  ACTIVITY:  You should plan to take it easy for the rest of today and you should NOT DRIVE or use heavy machinery until tomorrow (because of the sedation medicines used during the test).    FOLLOW UP: Our staff will call the number listed on your records the next business day following your procedure to check on you and address any questions or concerns that you may have regarding the information given to you following your procedure. If we do not reach you, we will leave a message.  However, if you are feeling well and you are not experiencing any problems, there is no need to return our call.  We will assume that you have returned to your regular daily activities without incident.  If any biopsies were taken you will be contacted by phone or by letter within the next 1-3 weeks.  Please call us at [phone] if you have not heard about the biopsies in 3 weeks.    SIGNATURES/CONFIDENTIALITY: You and/or your care partner have signed paperwork which will be entered into your electronic medical record.  These signatures attest to the fact that that the information above on your After Visit Summary has been reviewed and is understood.  Full responsibility of the confidentiality of this discharge information lies with you and/or your care-partner.  Polyp information given.  

## 2015-01-16 NOTE — Progress Notes (Signed)
Transferred to recovery room. A/O x3, pleased with MAC.  VSS.  Report to Jane, RN. 

## 2015-01-16 NOTE — Op Note (Signed)
[facility]  [address] [place], [address]   COLONOSCOPY PROCEDURE REPORT  PATIENT: Kerry, Barnes  MR#: [id number] BIRTHDATE: 1933/10/18 , 80  yrs. old GENDER: female ENDOSCOPIST: Jerene Bears, MD PROCEDURE DATE:  01/16/2015 PROCEDURE:   Colonoscopy, diagnostic and Colonoscopy with cold biopsy polypectomy First Screening Colonoscopy - Avg.  risk and is 50 yrs.  old or older - No.  Prior Negative Screening - Now for repeat screening. 10 or more years since last screening  History of Adenoma - Now for follow-up colonoscopy & has been > or = to 3 yrs.  N/A  Polyps removed today? Yes ASA CLASS:   Class III INDICATIONS:Positive Cologuard, last colonoscopy 10 yrs ago (normal). MEDICATIONS: Monitored anesthesia care and Propofol 175 mg IV  DESCRIPTION OF PROCEDURE:   After the risks benefits and alternatives of the procedure were thoroughly explained, informed consent was obtained.  The digital rectal exam revealed no rectal mass.   The LB [id number]-H190 [id number]  endoscope was introduced through the anus and advanced to the cecum, which was identified by both the appendix and ileocecal valve. No adverse events experienced. The quality of the prep was good.  (Suprep was used)  The instrument was then slowly withdrawn as the colon was fully examined. Estimated blood loss is zero unless otherwise noted in this procedure report.  COLON FINDINGS: Three sessile polyps ranging from 2 to 13mm in size were found at the cecum and in the transverse colon.  Polypectomies were performed with cold forceps.  The resection was complete, the polyp tissue was completely retrieved and sent to histology.   The examination was otherwise normal.  Retroflexed views revealed internal hemorrhoids. The time to cecum = 5.9 Withdrawal time = 15.2   The scope was withdrawn and the procedure completed. COMPLICATIONS: There were no immediate complications.  ENDOSCOPIC IMPRESSION: 1.   Three  sessile polyps ranging from 2 to 5mm in size were found at the cecum and in the transverse colon; polypectomies were performed with cold forceps 2.   The examination was otherwise normal  RECOMMENDATIONS: 1.  Await pathology results 2.  Timing of repeat colonoscopy will be determined by pathology findings. 3.  You will receive a letter within 1-2 weeks with the results of your biopsy as well as final recommendations.  Please call my office if you have not received a letter after 3 weeks.  eSigned:  Jerene Bears, MD 01/16/2015 10:52 AM   cc: The Patient and Emmie Niemann, [place]

## 2015-01-17 ENCOUNTER — Telehealth: Payer: Self-pay | Admitting: Emergency Medicine

## 2015-01-17 NOTE — Telephone Encounter (Signed)
  Follow up Call-  Call back number 01/16/2015 10/31/2013 10/18/2012  Post procedure Call Back phone  # [phone]  Permission to leave phone message Yes Yes Yes     Patient questions:  Do you have a fever, pain , or abdominal swelling? No. Pain Score  0 *  Have you tolerated food without any problems? Yes.    Have you been able to return to your normal activities? Yes.    Do you have any questions about your discharge instructions: Diet   No. Medications  No. Follow up visit  No.  Do you have questions or concerns about your Care? No.  Actions: * If pain score is 4 or above: No action needed, pain <4.

## 2015-01-18 ENCOUNTER — Encounter: Payer: Self-pay | Admitting: Internal Medicine

## 2015-01-23 ENCOUNTER — Encounter: Payer: Self-pay | Admitting: Internal Medicine

## 2015-03-27 ENCOUNTER — Other Ambulatory Visit: Payer: Self-pay | Admitting: Internal Medicine

## 2015-04-22 ENCOUNTER — Encounter: Payer: Self-pay | Admitting: Internal Medicine

## 2015-04-23 ENCOUNTER — Ambulatory Visit (INDEPENDENT_AMBULATORY_CARE_PROVIDER_SITE_OTHER): Payer: PPO | Admitting: Cardiovascular Disease

## 2015-04-23 ENCOUNTER — Encounter: Payer: Self-pay | Admitting: Cardiovascular Disease

## 2015-04-23 VITALS — BP 122/68 | HR 74 | Ht 60.0 in | Wt 145.0 lb

## 2015-04-23 DIAGNOSIS — E785 Hyperlipidemia, unspecified: Secondary | ICD-10-CM | POA: Diagnosis not present

## 2015-04-23 DIAGNOSIS — I5032 Chronic diastolic (congestive) heart failure: Secondary | ICD-10-CM | POA: Diagnosis not present

## 2015-04-23 DIAGNOSIS — I10 Essential (primary) hypertension: Secondary | ICD-10-CM

## 2015-04-23 DIAGNOSIS — R0989 Other specified symptoms and signs involving the circulatory and respiratory systems: Secondary | ICD-10-CM | POA: Diagnosis not present

## 2015-04-23 NOTE — Patient Instructions (Signed)
Medication Instructions:  Your physician recommends that you continue on your current medications as directed. Please refer to the Current Medication list given to you today.  Labwork: No new orders.   Testing/Procedures: Your physician has requested that you have a carotid duplex. This test is an ultrasound of the carotid arteries in your neck. It looks at blood flow through these arteries that supply the brain with blood. Allow one hour for this exam. There are no restrictions or special instructions.  Follow-Up: Your physician wants you to follow-up in: 1 YEAR with Dr Fletcher Anon.  You will receive a reminder letter in the mail two months in advance. If you don't receive a letter, please call our office to schedule the follow-up appointment.   Any Other Special Instructions Will Be Listed Below (If Applicable).     If you need a refill on your cardiac medications before your next appointment, please call your pharmacy.

## 2015-04-23 NOTE — Progress Notes (Signed)
HPI  This is a 81 year old female who is here today for a followup visit. She has history of chronic diastolic heart failure(with moderate pulmonary hypertension),  hypertension and mild mitral regurgitation. She has known history of hypertension, hyperlipidemia, TIA (on Plavix for that indication) as well as diabetes mellitus. Most recent echocardiogram in 2012 showed normal LV systolic function with mild mitral regurgitation. There was moderate diastolic dysfunction and pulmonary hypertension.  She underwent a Lexiscan nuclear stress test in 09/2013 which was normal. ABI was done for atypical leg pain and was normal.  She has been doing very well and denies any new symptoms. Diabetes and hypertension has been both well controlled. She complains of mild exertional palpitations with no prolonged tachycardia.  Allergies  Allergen Reactions  . Amoxicillin     REACTION: unspecified  . Biaxin [Clarithromycin]   . Ciprofloxacin   . Codeine   . Eggs Or Egg-Derived Products     Had welps as a child.  Has eaten eggs recently with no problems.  . Metformin And Related   . Penicillins      Current Outpatient Prescriptions on File Prior to Visit  Medication Sig Dispense Refill  . albuterol (PROVENTIL HFA;VENTOLIN HFA) 108 (90 BASE) MCG/ACT inhaler Inhale into the lungs every 6 (six) hours as needed for wheezing or shortness of breath.    . ALPRAZolam (XANAX) 0.5 MG tablet Take 0.5 mg by mouth 2 (two) times daily as needed for anxiety.    Marland Kitchen amLODipine (NORVASC) 5 MG tablet Take 5 mg by mouth daily.    Marland Kitchen atorvastatin (LIPITOR) 40 MG tablet Take 40 mg by mouth daily.    . clobetasol ointment (TEMOVATE) 1.61 % Apply 1 application topically 2 (two) times daily.    . clopidogrel (PLAVIX) 75 MG tablet Take 75 mg by mouth daily.    . cyclobenzaprine (FLEXERIL) 10 MG tablet Take 10 mg by mouth as directed.    Marland Kitchen EPINEPHrine (EPI-PEN) 0.3 mg/0.3 mL SOAJ injection Inject into the muscle once.    .  Fluticasone-Salmeterol (ADVAIR) 100-50 MCG/DOSE AEPB Inhale 1 puff into the lungs every 12 (twelve) hours.    . insulin glargine (LANTUS) 100 UNIT/ML injection Inject 30 Units into the skin at bedtime.    Marland Kitchen LINZESS 145 MCG CAPS capsule TAKE 1 CAPSULE(145 MCG) BY MOUTH DAILY 30 capsule 4  . meclizine (ANTIVERT) 25 MG tablet Take 25 mg by mouth 3 (three) times daily as needed for dizziness.     . metroNIDAZOLE (FLAGYL) 250 MG tablet Take 1 tablet (250 mg total) by mouth 3 (three) times daily. 21 tablet 0  . mometasone (NASONEX) 50 MCG/ACT nasal spray Place 2 sprays into the nose daily.    Marland Kitchen olmesartan-hydrochlorothiazide (BENICAR HCT) 40-25 MG per tablet Take 1 tablet by mouth every morning.    Marland Kitchen olopatadine (PATANOL) 0.1 % ophthalmic solution Place 1 drop into both eyes 2 (two) times daily.     . pantoprazole (PROTONIX) 40 MG tablet Take 1 tablet (40 mg total) by mouth daily. 90 tablet 3  . pregabalin (LYRICA) 75 MG capsule Take 75 mg by mouth 2 (two) times daily.     No current facility-administered medications on file prior to visit.     Past Medical History  Diagnosis Date  . Hypertension   . Diabetes mellitus   . Fibromyalgia   . Asthma   . Hyperlipemia   . IBS (irritable bowel syndrome)   . Blood transfusion without reported diagnosis   .  Cataract   . GERD (gastroesophageal reflux disease)   . Allergy   . Anxiety   . Arthritis   . Stroke [facility]) 2013    TIA     Past Surgical History  Procedure Laterality Date  . Cholecystectomy    . Ankle fracture surgery    . Wrist fracture surgery    . Tubal ligation       Family History  Problem Relation Age of Onset  . Diabetes Mother   . Diabetes Father   . Diabetes Sister   . Diabetes Brother   . Colon cancer Neg Hx      Social History   Social History  . Marital Status: Married    Spouse Name: N/A  . Number of Children: 8  . Years of Education: N/A   Occupational History  . Housewife    Social History Main  Topics  . Smoking status: Never Smoker   . Smokeless tobacco: Never Used  . Alcohol Use: No  . Drug Use: No  . Sexual Activity: Not on file   Other Topics Concern  . Not on file   Social History Narrative     PHYSICAL EXAM   BP 122/68 mmHg  Pulse 74  Ht 5' (1.524 m)  Wt 145 lb (65.772 kg)  BMI 28.32 kg/m2 Constitutional: She is oriented to person, place, and time. She appears well-developed and well-nourished. No distress.  HENT: No nasal discharge.  Head: Normocephalic and atraumatic.  Eyes: Pupils are equal and round. No discharge.  Neck: Normal range of motion. Neck supple. No JVD present. No thyromegaly present. Faint left carotid bruit Cardiovascular: Normal rate, regular rhythm, normal heart sounds. Exam reveals no gallop and no friction rub. No murmur heard.  Pulmonary/Chest: Effort normal and breath sounds normal. No stridor. No respiratory distress. She has no wheezes. She has no rales. She exhibits no tenderness.  Abdominal: Soft. Bowel sounds are normal. She exhibits no distension. There is no tenderness. There is no rebound and no guarding.  Musculoskeletal: Normal range of motion. She exhibits no edema and no tenderness.  Neurological: She is alert and oriented to person, place, and time. Coordination normal.  Skin: Skin is warm and dry. No rash noted. She is not diaphoretic. No erythema. No pallor.  Psychiatric: She has a normal mood and affect. Her behavior is normal. Judgment and thought content normal.  Femoral pulses are normal bilaterally. His pulses are slightly diminished on both sides.   EKG: Normal sinus rhythm with PACs. T wave changes in the inferior and anterolateral leads. The EKG is not different from  previous ones.   ASSESSMENT AND PLAN

## 2015-04-23 NOTE — Assessment & Plan Note (Signed)
Followed by PCP. Continue Atorvastatin with a target LDL <100 and preferably less than 70 given that she is diabetic.

## 2015-04-23 NOTE — Assessment & Plan Note (Signed)
She has been doing very well with no symptoms of fluid overload. She is on optimal medical therapy.

## 2015-04-23 NOTE — Assessment & Plan Note (Signed)
I requested carotid Doppler for evaluation. She is already on optimal medical therapy and is on Plavix.

## 2015-04-23 NOTE — Assessment & Plan Note (Signed)
Blood pressure is well controlled on current medications. 

## 2015-04-29 ENCOUNTER — Ambulatory Visit (HOSPITAL_COMMUNITY)
Admission: RE | Admit: 2015-04-29 | Discharge: 2015-04-29 | Disposition: A | Discharge status: Home / Self Care | Payer: PPO | Source: Ambulatory Visit | Attending: Cardiovascular Disease | Admitting: Cardiovascular Disease

## 2015-04-29 DIAGNOSIS — R0989 Other specified symptoms and signs involving the circulatory and respiratory systems: Secondary | ICD-10-CM

## 2015-04-29 DIAGNOSIS — I10 Essential (primary) hypertension: Secondary | ICD-10-CM

## 2015-04-29 DIAGNOSIS — E785 Hyperlipidemia, unspecified: Secondary | ICD-10-CM | POA: Insufficient documentation

## 2015-04-29 DIAGNOSIS — E119 Type 2 diabetes mellitus without complications: Secondary | ICD-10-CM | POA: Diagnosis not present

## 2015-04-29 DIAGNOSIS — I6523 Occlusion and stenosis of bilateral carotid arteries: Secondary | ICD-10-CM | POA: Insufficient documentation

## 2015-05-24 ENCOUNTER — Encounter: Payer: Self-pay | Admitting: Internal Medicine

## 2015-07-05 DIAGNOSIS — H1045 Other chronic allergic conjunctivitis: Secondary | ICD-10-CM | POA: Diagnosis not present

## 2015-07-12 DIAGNOSIS — J069 Acute upper respiratory infection, unspecified: Secondary | ICD-10-CM | POA: Diagnosis not present

## 2015-07-12 DIAGNOSIS — R799 Abnormal finding of blood chemistry, unspecified: Secondary | ICD-10-CM | POA: Diagnosis not present

## 2015-07-31 DIAGNOSIS — E1142 Type 2 diabetes mellitus with diabetic polyneuropathy: Secondary | ICD-10-CM | POA: Diagnosis not present

## 2015-08-07 DIAGNOSIS — E1142 Type 2 diabetes mellitus with diabetic polyneuropathy: Secondary | ICD-10-CM | POA: Diagnosis not present

## 2015-08-07 DIAGNOSIS — I10 Essential (primary) hypertension: Secondary | ICD-10-CM | POA: Diagnosis not present

## 2015-08-07 DIAGNOSIS — E7800 Pure hypercholesterolemia, unspecified: Secondary | ICD-10-CM | POA: Diagnosis not present

## 2015-08-07 DIAGNOSIS — G609 Hereditary and idiopathic neuropathy, unspecified: Secondary | ICD-10-CM | POA: Diagnosis not present

## 2015-08-30 ENCOUNTER — Encounter: Payer: Self-pay | Admitting: *Deleted

## 2015-09-10 ENCOUNTER — Other Ambulatory Visit (INDEPENDENT_AMBULATORY_CARE_PROVIDER_SITE_OTHER): Payer: PPO

## 2015-09-10 ENCOUNTER — Encounter: Payer: Self-pay | Admitting: Internal Medicine

## 2015-09-10 ENCOUNTER — Ambulatory Visit (INDEPENDENT_AMBULATORY_CARE_PROVIDER_SITE_OTHER): Payer: PPO | Admitting: Internal Medicine

## 2015-09-10 VITALS — BP 112/60 | HR 64 | Ht 60.0 in | Wt 143.8 lb

## 2015-09-10 DIAGNOSIS — Z8709 Personal history of other diseases of the respiratory system: Secondary | ICD-10-CM | POA: Insufficient documentation

## 2015-09-10 DIAGNOSIS — K5900 Constipation, unspecified: Secondary | ICD-10-CM | POA: Diagnosis not present

## 2015-09-10 DIAGNOSIS — K5909 Other constipation: Secondary | ICD-10-CM

## 2015-09-10 DIAGNOSIS — J4540 Moderate persistent asthma, uncomplicated: Secondary | ICD-10-CM | POA: Insufficient documentation

## 2015-09-10 DIAGNOSIS — R198 Other specified symptoms and signs involving the digestive system and abdomen: Secondary | ICD-10-CM

## 2015-09-10 DIAGNOSIS — R1912 Hyperactive bowel sounds: Secondary | ICD-10-CM | POA: Diagnosis not present

## 2015-09-10 DIAGNOSIS — J029 Acute pharyngitis, unspecified: Secondary | ICD-10-CM | POA: Insufficient documentation

## 2015-09-10 DIAGNOSIS — K589 Irritable bowel syndrome without diarrhea: Secondary | ICD-10-CM

## 2015-09-10 DIAGNOSIS — L299 Pruritus, unspecified: Secondary | ICD-10-CM | POA: Diagnosis not present

## 2015-09-10 DIAGNOSIS — K219 Gastro-esophageal reflux disease without esophagitis: Secondary | ICD-10-CM

## 2015-09-10 DIAGNOSIS — J45901 Unspecified asthma with (acute) exacerbation: Secondary | ICD-10-CM | POA: Insufficient documentation

## 2015-09-10 DIAGNOSIS — R1310 Dysphagia, unspecified: Secondary | ICD-10-CM | POA: Insufficient documentation

## 2015-09-10 LAB — CBC WITH DIFFERENTIAL/PLATELET
Basophils Absolute: 0.1 10*3/uL (ref 0.0–0.1)
Basophils Relative: 0.9 % (ref 0.0–3.0)
Eosinophils Absolute: 0.8 10*3/uL — ABNORMAL HIGH (ref 0.0–0.7)
Eosinophils Relative: 8.1 % — ABNORMAL HIGH (ref 0.0–5.0)
HCT: 36.7 % (ref 36.0–46.0)
Hemoglobin: 11.9 g/dL — ABNORMAL LOW (ref 12.0–15.0)
Lymphocytes Relative: 34.3 % (ref 12.0–46.0)
Lymphs Abs: 3.3 10*3/uL (ref 0.7–4.0)
MCHC: 32.5 g/dL (ref 30.0–36.0)
MCV: 81.7 fl (ref 78.0–100.0)
Monocytes Absolute: 0.7 10*3/uL (ref 0.1–1.0)
Monocytes Relative: 7.4 % (ref 3.0–12.0)
Neutro Abs: 4.8 10*3/uL (ref 1.4–7.7)
Neutrophils Relative %: 49.3 % (ref 43.0–77.0)
Platelets: 297 10*3/uL (ref 150.0–400.0)
RBC: 4.5 Mil/uL (ref 3.87–5.11)
RDW: 15.7 % — ABNORMAL HIGH (ref 11.5–15.5)
WBC: 9.7 10*3/uL (ref 4.0–10.5)

## 2015-09-10 LAB — COMPREHENSIVE METABOLIC PANEL
ALT: 17 U/L (ref 0–35)
AST: 27 U/L (ref 0–37)
Albumin: 4.2 g/dL (ref 3.5–5.2)
Alkaline Phosphatase: 66 U/L (ref 39–117)
BUN: 18 mg/dL (ref 6–23)
CO2: 28 mEq/L (ref 19–32)
Calcium: 9.3 mg/dL (ref 8.4–10.5)
Chloride: 104 mEq/L (ref 96–112)
Creatinine, Ser: 1.2 mg/dL (ref 0.40–1.20)
GFR: 55.66 mL/min — ABNORMAL LOW (ref >60.00)
Glucose, Bld: 83 mg/dL (ref 70–99)
Potassium: 3.9 mEq/L (ref 3.5–5.1)
Sodium: 139 mEq/L (ref 135–145)
Total Bilirubin: 0.5 mg/dL (ref 0.2–1.2)
Total Protein: 7.3 g/dL (ref 6.0–8.3)

## 2015-09-10 NOTE — Patient Instructions (Signed)
Continue Linzess and Pantoprazole.  Please purchase the following medications over the counter and take as directed: IB Guard-Take 1-2 tablets three times daily as needed for stomach gurgling.  If the IB Guard does not work, call us. We may try you on Levsin.  Your physician has requested that you go to the basement for the following lab work before leaving today: CBC, CMP

## 2015-09-10 NOTE — Progress Notes (Signed)
Subjective:    Patient ID: Kerry Barnes, female    DOB: [birth year], 82 y.o.   MRN: [id number]  HPI Kerry Barnes is a 82 yo female with Past medical history of IBS, GERD, abdominal bloating, and adenomatous colon polyps who is here for follow-up. She was last seen in the office on 12/19/2014. She had a positive Cologuard after that appointment and came for colonoscopy performed on 01/16/2015. This revealed 3 small polyps removed from the cecum and transverse colon. 2 these were found to be tubular adenomas. The third was a lymphoid polyp and benign.  She reports that she's been doing very well but is having issues with itching. She reports somewhat diffuse itching on her bilateral arms chest back and abdomen. Her sister is had an itchy rash but she has not developed a rash. She reports being seen by dermatology and given a cream which didn't help much. She also saw an allergist and had allergy skin testing. No food or environmental allergy was definitely identified. She switched her detergent from Tide to All and this seems to have helped some. She is using moisturizing creams. No new medications. Secondly she's been trouble with borborygmi. This doesn't hurt but embarrasses her when she is at church or out in public. She states her abdomen makes a lot of noise. Again no abdominal pain. Good appetite. No heartburn or trouble swallowing recently. No nausea or vomiting. Daily bowel movement with Linzess 145 g daily and eating prunes. Very happy with the effective Linzess. No blood in her stool or melena.   Review of Systems As per history of present illness, otherwise negative  Current Medications, Allergies, Past Medical History, Past Surgical History, Family History and Social History were reviewed in [employer] record.     Objective:   Physical Exam BP 112/60 mmHg  Pulse 64  Ht 5' (1.524 m)  Wt 143 lb 12.8 oz (65.227 kg)  BMI 28.08 kg/m2 Constitutional:  Well-developed and well-nourished. No distress. HEENT: Normocephalic and atraumatic. Oropharynx is clear and moist. No oropharyngeal exudate. Conjunctivae are normal.  No scleral icterus. Neck: Neck supple. Trachea midline. Cardiovascular: Normal rate, regular rhythm and intact distal pulses. No M/R/G Pulmonary/chest: Effort normal and breath sounds normal. No wheezing, rales or rhonchi. Abdominal: Soft, nontender, nondistended. Bowel sounds active throughout. There are no masses palpable. . Extremities: no clubbing, cyanosis, or edema Neurological: Alert and oriented to person place and time. Skin: Skin is warm and dry. No rashes noted. Psychiatric: Normal mood and affect. Behavior is normal.      Assessment & Plan:  82 yo female with Past medical history of IBS, GERD, abdominal bloating, and adenomatous colon polyps who is here for follow-up.  1. IBS with chronic constipation and borborygmi -- I explained the borborygmi can be a normal part of gut function. It also may be secondary to Linzess therapy which increases colonic movement. She isn't sitting continuing Linzess given the positive benefit. I will try her on over-the-counter IBgard.  She can use 1-2 tablets 3 times a day as needed. Sample boxes given today. This is available over-the-counter effective. If ineffective we could try sublingual Levsin. She will let me know by phone call if symptoms are improving or not.  2. GERD -- stable and well controlled on pantoprazole. Continue 40 mg daily  3. History of adenomatous colon polyp -- 2 very small adenomas in 2016. Further surveillance deferred due to age.  4.  Pruritis -- she has worked with  dermatology and allergy. There is no visible rash. I will check CMP to rule out hyperbilirubinemia though there is no jaundice clinically. Also check CBC with differential to rule out eosinophilia.  One year follow-up, sooner as needed 25 minutes spent with the patient today. Greater than 50% was  spent in counseling and coordination of care with the patient

## 2015-09-13 ENCOUNTER — Other Ambulatory Visit: Payer: Self-pay | Admitting: Internal Medicine

## 2015-09-18 DIAGNOSIS — L309 Dermatitis, unspecified: Secondary | ICD-10-CM | POA: Diagnosis not present

## 2015-10-01 DIAGNOSIS — Z9849 Cataract extraction status, unspecified eye: Secondary | ICD-10-CM | POA: Diagnosis not present

## 2015-10-01 DIAGNOSIS — Z961 Presence of intraocular lens: Secondary | ICD-10-CM | POA: Diagnosis not present

## 2015-10-01 DIAGNOSIS — H11423 Conjunctival edema, bilateral: Secondary | ICD-10-CM | POA: Diagnosis not present

## 2015-10-01 DIAGNOSIS — H04123 Dry eye syndrome of bilateral lacrimal glands: Secondary | ICD-10-CM | POA: Diagnosis not present

## 2015-10-01 DIAGNOSIS — H18413 Arcus senilis, bilateral: Secondary | ICD-10-CM | POA: Diagnosis not present

## 2015-10-01 DIAGNOSIS — H11153 Pinguecula, bilateral: Secondary | ICD-10-CM | POA: Diagnosis not present

## 2015-10-16 DIAGNOSIS — H1045 Other chronic allergic conjunctivitis: Secondary | ICD-10-CM | POA: Diagnosis not present

## 2015-10-16 DIAGNOSIS — D721 Eosinophilia: Secondary | ICD-10-CM | POA: Diagnosis not present

## 2015-10-25 DIAGNOSIS — H04123 Dry eye syndrome of bilateral lacrimal glands: Secondary | ICD-10-CM | POA: Diagnosis not present

## 2015-10-25 DIAGNOSIS — H16223 Keratoconjunctivitis sicca, not specified as Sjogren's, bilateral: Secondary | ICD-10-CM | POA: Diagnosis not present

## 2015-10-25 DIAGNOSIS — H16143 Punctate keratitis, bilateral: Secondary | ICD-10-CM | POA: Diagnosis not present

## 2015-10-30 DIAGNOSIS — R5383 Other fatigue: Secondary | ICD-10-CM | POA: Diagnosis not present

## 2015-10-30 DIAGNOSIS — R001 Bradycardia, unspecified: Secondary | ICD-10-CM | POA: Diagnosis not present

## 2015-10-30 DIAGNOSIS — B86 Scabies: Secondary | ICD-10-CM | POA: Diagnosis not present

## 2015-11-05 ENCOUNTER — Ambulatory Visit (INDEPENDENT_AMBULATORY_CARE_PROVIDER_SITE_OTHER): Payer: PPO | Admitting: Cardiovascular Disease

## 2015-11-05 VITALS — BP 119/65 | HR 66 | Ht 61.0 in | Wt 147.0 lb

## 2015-11-05 DIAGNOSIS — R002 Palpitations: Secondary | ICD-10-CM

## 2015-11-05 DIAGNOSIS — R0602 Shortness of breath: Secondary | ICD-10-CM | POA: Diagnosis not present

## 2015-11-05 NOTE — Patient Instructions (Signed)
Medication Instructions:  Your physician recommends that you continue on your current medications as directed. Please refer to the Current Medication list given to you today.  Labwork: No new orders.   Testing/Procedures: Your physician has requested that you have an echocardiogram. Echocardiography is a painless test that uses sound waves to create images of your heart. It provides your doctor with information about the size and shape of your heart and how well your heart's chambers and valves are working. This procedure takes approximately one hour. There are no restrictions for this procedure.  Your physician has recommended that you wear a 48 hour holter monitor. Holter monitors are medical devices that record the heart's electrical activity. Doctors most often use these monitors to diagnose arrhythmias. Arrhythmias are problems with the speed or rhythm of the heartbeat. The monitor is a small, portable device. You can wear one while you do your normal daily activities. This is usually used to diagnose what is causing palpitations/syncope (passing out).  Follow-Up: Your physician wants you to follow-up in: 6 MONTHS with Dr Fletcher Anon.  You will receive a reminder letter in the mail two months in advance. If you don't receive a letter, please call our office to schedule the follow-up appointment.   Any Other Special Instructions Will Be Listed Below (If Applicable).     If you need a refill on your cardiac medications before your next appointment, please call your pharmacy.

## 2015-11-05 NOTE — Progress Notes (Signed)
Cardiology Office Note   Date:  11/05/2015   ID:  Kerry Barnes, DOB 1933-09-15, MRN [id number]  PCP:  Tula Nakayama  Cardiologist:   Kathlyn Sacramento, MD   Chief Complaint  Patient presents with  . Follow-up    pt c/o fast heart rate      History of Present Illness: Kerry Barnes is a 82 y.o. female who Was referred back for evaluation of shortness of breath. She is well-known to me with history of chronic diastolic heart failure(with moderate pulmonary hypertension),  hypertension and mild mitral regurgitation. She has known history of hypertension, hyperlipidemia, TIA (on Plavix for that indication) as well as diabetes mellitus. Most recent echocardiogram in 2012 showed normal LV systolic function with mild mitral regurgitation. There was moderate diastolic dysfunction and pulmonary hypertension.  She underwent a Lexiscan nuclear stress test in 09/2013 which was normal. ABI was done for atypical leg pain and was normal.  Carotid Doppler in November 2016 showed mild nonobstructive bilateral disease. She has been doing reasonably well. However, over the last month, she has experienced intermittent episodes of palpitations and tachycardia. She has not had any dizziness, syncope or presyncope. She also complains of increased exertional dyspnea without chest discomfort. She reports improvement in palpitations over the last week. She has no history of arrhythmia.   Past Medical History  Diagnosis Date  . Hypertension   . Diabetes mellitus   . Fibromyalgia   . Asthma   . Hyperlipemia   . IBS (irritable bowel syndrome)   . Blood transfusion without reported diagnosis   . Cataract   . GERD (gastroesophageal reflux disease)   . Allergy   . Anxiety   . Arthritis   . Stroke [facility]) 2013    TIA  . Tubular adenoma of colon     Past Surgical History  Procedure Laterality Date  . Cholecystectomy    . Ankle fracture surgery    . Wrist fracture surgery    . Tubal  ligation       Current Outpatient Prescriptions  Medication Sig Dispense Refill  . albuterol (PROVENTIL HFA;VENTOLIN HFA) 108 (90 BASE) MCG/ACT inhaler Inhale into the lungs every 6 (six) hours as needed for wheezing or shortness of breath.    . ALPRAZolam (XANAX) 0.5 MG tablet Take 0.5 mg by mouth 2 (two) times daily as needed for anxiety.    Marland Kitchen amLODipine (NORVASC) 5 MG tablet Take 5 mg by mouth daily.    Marland Kitchen atorvastatin (LIPITOR) 40 MG tablet Take 40 mg by mouth daily.    . clobetasol ointment (TEMOVATE) [id number] % Apply 1 application topically 2 (two) times daily.    . clopidogrel (PLAVIX) 75 MG tablet Take 75 mg by mouth daily.    . cyclobenzaprine (FLEXERIL) 10 MG tablet Take 10 mg by mouth as directed.    Marland Kitchen EPINEPHrine (EPI-PEN) 0.3 mg/0.3 mL SOAJ injection Inject into the muscle once.    Marland Kitchen FLUOCINOLONE ACETONIDE BODY 0.01 % OIL Apply 1 application topically daily.    . Fluticasone-Salmeterol (ADVAIR) 100-50 MCG/DOSE AEPB Inhale 1 puff into the lungs every 12 (twelve) hours.    . insulin glargine (LANTUS) 100 UNIT/ML injection Inject 24 Units into the skin at bedtime.     Marland Kitchen LINZESS 145 MCG CAPS capsule TAKE 1 CAPSULE(145 MCG) BY MOUTH DAILY 30 capsule 3  . meclizine (ANTIVERT) 25 MG tablet Take 25 mg by mouth 3 (three) times daily as needed for dizziness.     . mometasone (  NASONEX) 50 MCG/ACT nasal spray Place 2 sprays into the nose daily.    Marland Kitchen olmesartan-hydrochlorothiazide (BENICAR HCT) 40-25 MG per tablet Take 1 tablet by mouth every morning.    Marland Kitchen olopatadine (PATANOL) 0.1 % ophthalmic solution Place 1 drop into both eyes 2 (two) times daily.     . pantoprazole (PROTONIX) 40 MG tablet Take 1 tablet (40 mg total) by mouth daily. 90 tablet 3  . permethrin (ELIMITE) 5 % cream Apply 1 application topically daily.    . pregabalin (LYRICA) 75 MG capsule Take 75 mg by mouth 2 (two) times daily.    . traMADol (ULTRAM) 50 MG tablet Take 1 tablet by mouth as needed.     No current  facility-administered medications for this visit.    Allergies:   Amoxicillin; Biaxin; Ciprofloxacin; Codeine; Eggs or egg-derived products; Metformin and related; and Penicillins    Social History:  The patient  reports that she has never smoked. She has never used smokeless tobacco. She reports that she does not drink alcohol or use illicit drugs.   Family History:  The patient's family history includes Diabetes in her brother, father, mother, and sister. There is no history of Colon cancer.    ROS:  Please see the history of present illness.   Otherwise, review of systems are positive for none.   All other systems are reviewed and negative.    PHYSICAL EXAM: VS:  BP 119/65 mmHg  Pulse 66  Ht 5\' 1"  (1.549 m)  Wt 147 lb (66.679 kg)  BMI 27.79 kg/m2 , BMI Body mass index is 27.79 kg/(m^2). GEN: Well nourished, well developed, in no acute distress HEENT: normal Neck: no JVD, carotid bruits, or masses Cardiac: RRR; no murmurs, rubs, or gallops,no edema  Respiratory:  clear to auscultation bilaterally, normal work of breathing GI: soft, nontender, nondistended, + BS MS: no deformity or atrophy Skin: warm and dry, no rash Neuro:  Strength and sensation are intact Psych: euthymic mood, full affect   EKG:  EKG is ordered today. The ekg ordered today demonstrates normal sinus rhythm with lateral T-wave changes which are not new.   Recent Labs: 09/10/2015: ALT 17; BUN 18; Creatinine, Ser 1.20; Hemoglobin 11.9*; Platelets 297.0; Potassium 3.9; Sodium 139    Lipid Panel    Component Value Date/Time   CHOL * 09/09/2010 0110    206        ATP III CLASSIFICATION:  <200     mg/dL   Desirable  200-239  mg/dL   Borderline High  >=240    mg/dL   High          TRIG 129 09/09/2010 0110   HDL 51 09/09/2010 0110   CHOLHDL 4.0 09/09/2010 0110   VLDL 26 09/09/2010 0110   LDLCALC * 09/09/2010 0110    129        Total Cholesterol/HDL:CHD Risk Coronary Heart Disease Risk Table                      Men   Women  1/2 Average Risk   3.4   3.3  Average Risk       5.0   4.4  2 X Average Risk   9.6   7.1  3 X Average Risk  23.4   11.0        Use the calculated Patient Ratio above and the CHD Risk Table to determine the patient's CHD Risk.        ATP  III CLASSIFICATION (LDL):  <100     mg/dL   Optimal  100-129  mg/dL   Near or Above                    Optimal  130-159  mg/dL   Borderline  160-189  mg/dL   High  >190     mg/dL   Very High      Wt Readings from Last 3 Encounters:  11/05/15 147 lb (66.679 kg)  09/10/15 143 lb 12.8 oz (65.227 kg)  04/23/15 145 lb (65.772 kg)        ASSESSMENT AND PLAN:  1.  Palpitations and tachycardia: The exact etiology is not entirely clear. Baseline ECG is unremarkable and not different from her prior EKGs. I requested a 48-hour Holter monitor for evaluation. I recommend checking routine labs including thyroid function if that has not been already done.   2. Chronic diastolic heart failure: She appears to be euvolemic but she complains of increased exertional dyspnea without chest pain. This, I requested an echocardiogram for evaluation. She is known to have moderate pulmonary hypertension.  3. Essential hypertension: Blood pressure is well controlled on current medications.    Disposition:   FU with me in 6 months  Signed, Kathlyn Sacramento, MD  11/05/2015 9:25 AM    [place]

## 2015-11-08 DIAGNOSIS — H5203 Hypermetropia, bilateral: Secondary | ICD-10-CM | POA: Diagnosis not present

## 2015-11-08 DIAGNOSIS — H04123 Dry eye syndrome of bilateral lacrimal glands: Secondary | ICD-10-CM | POA: Diagnosis not present

## 2015-11-08 DIAGNOSIS — E103212 Type 1 diabetes mellitus with mild nonproliferative diabetic retinopathy with macular edema, left eye: Secondary | ICD-10-CM | POA: Diagnosis not present

## 2015-11-08 DIAGNOSIS — H11153 Pinguecula, bilateral: Secondary | ICD-10-CM | POA: Diagnosis not present

## 2015-11-08 DIAGNOSIS — H35031 Hypertensive retinopathy, right eye: Secondary | ICD-10-CM | POA: Diagnosis not present

## 2015-11-08 DIAGNOSIS — H18413 Arcus senilis, bilateral: Secondary | ICD-10-CM | POA: Diagnosis not present

## 2015-11-08 DIAGNOSIS — H524 Presbyopia: Secondary | ICD-10-CM | POA: Diagnosis not present

## 2015-11-08 DIAGNOSIS — H52223 Regular astigmatism, bilateral: Secondary | ICD-10-CM | POA: Diagnosis not present

## 2015-11-08 DIAGNOSIS — E10311 Type 1 diabetes mellitus with unspecified diabetic retinopathy with macular edema: Secondary | ICD-10-CM | POA: Diagnosis not present

## 2015-11-08 DIAGNOSIS — H11423 Conjunctival edema, bilateral: Secondary | ICD-10-CM | POA: Diagnosis not present

## 2015-11-08 DIAGNOSIS — Z9849 Cataract extraction status, unspecified eye: Secondary | ICD-10-CM | POA: Diagnosis not present

## 2015-11-08 DIAGNOSIS — Z961 Presence of intraocular lens: Secondary | ICD-10-CM | POA: Diagnosis not present

## 2015-11-20 ENCOUNTER — Other Ambulatory Visit: Payer: Self-pay

## 2015-11-20 ENCOUNTER — Ambulatory Visit (HOSPITAL_COMMUNITY): Payer: PPO | Attending: Cardiology

## 2015-11-20 ENCOUNTER — Ambulatory Visit (INDEPENDENT_AMBULATORY_CARE_PROVIDER_SITE_OTHER): Payer: PPO

## 2015-11-20 DIAGNOSIS — E119 Type 2 diabetes mellitus without complications: Secondary | ICD-10-CM | POA: Diagnosis not present

## 2015-11-20 DIAGNOSIS — I272 Other secondary pulmonary hypertension: Secondary | ICD-10-CM | POA: Diagnosis not present

## 2015-11-20 DIAGNOSIS — R0602 Shortness of breath: Secondary | ICD-10-CM

## 2015-11-20 DIAGNOSIS — I340 Nonrheumatic mitral (valve) insufficiency: Secondary | ICD-10-CM | POA: Diagnosis not present

## 2015-11-20 DIAGNOSIS — I119 Hypertensive heart disease without heart failure: Secondary | ICD-10-CM | POA: Diagnosis not present

## 2015-11-20 DIAGNOSIS — R0600 Dyspnea, unspecified: Secondary | ICD-10-CM | POA: Diagnosis present

## 2015-11-20 DIAGNOSIS — R0609 Other forms of dyspnea: Secondary | ICD-10-CM | POA: Diagnosis not present

## 2015-11-20 DIAGNOSIS — E785 Hyperlipidemia, unspecified: Secondary | ICD-10-CM | POA: Diagnosis not present

## 2015-11-20 DIAGNOSIS — R002 Palpitations: Secondary | ICD-10-CM

## 2015-11-20 LAB — ECHOCARDIOGRAM COMPLETE
E decel time: 377 ms
E/e' ratio: 24.07
FS: 35 % (ref 28–44)
IVS/LV PW RATIO, ED: 1.04
LA ID, A-P, ES: 45 mm
LA diam end sys: 45 mm
LA diam index: 2.71 cm/m2
LA vol A4C: 98 mL
LA vol index: 65.7 mL/m2
LA vol: 109 mL
LV E/e' medial: 24.07
LV E/e'average: 24.07
LV PW d: 8.15 mm — AB (ref 0.6–1.1)
LV e' LATERAL: 6.19 cm/s
LVOT SV: 55 mL
LVOT VTI: 24.4 cm
LVOT area: 2.27 cm2
LVOT diameter: 17 mm
LVOT peak grad rest: 5 mmHg
LVOT peak vel: 110 cm/s
MV Dec: 377
MV Peak grad: 9 mmHg
MV pk A vel: 62.3 m/s
MV pk E vel: 149 m/s
Reg peak vel: 356 cm/s
TDI e' lateral: 6.19
TDI e' medial: 5.13
TR max vel: 356 cm/s
VTI: 174 cm

## 2015-11-27 ENCOUNTER — Encounter (INDEPENDENT_AMBULATORY_CARE_PROVIDER_SITE_OTHER): Payer: PPO | Admitting: Ophthalmology

## 2015-11-27 DIAGNOSIS — E113312 Type 2 diabetes mellitus with moderate nonproliferative diabetic retinopathy with macular edema, left eye: Secondary | ICD-10-CM

## 2015-11-27 DIAGNOSIS — H43813 Vitreous degeneration, bilateral: Secondary | ICD-10-CM

## 2015-11-27 DIAGNOSIS — I10 Essential (primary) hypertension: Secondary | ICD-10-CM

## 2015-11-27 DIAGNOSIS — E113291 Type 2 diabetes mellitus with mild nonproliferative diabetic retinopathy without macular edema, right eye: Secondary | ICD-10-CM

## 2015-11-27 DIAGNOSIS — H35033 Hypertensive retinopathy, bilateral: Secondary | ICD-10-CM | POA: Diagnosis not present

## 2015-11-27 DIAGNOSIS — E11311 Type 2 diabetes mellitus with unspecified diabetic retinopathy with macular edema: Secondary | ICD-10-CM | POA: Diagnosis not present

## 2016-01-02 ENCOUNTER — Encounter (INDEPENDENT_AMBULATORY_CARE_PROVIDER_SITE_OTHER): Payer: PPO | Admitting: Ophthalmology

## 2016-01-07 ENCOUNTER — Ambulatory Visit (INDEPENDENT_AMBULATORY_CARE_PROVIDER_SITE_OTHER): Payer: PPO | Admitting: Cardiovascular Disease

## 2016-01-07 VITALS — BP 100/59 | HR 59 | Ht 61.0 in | Wt 150.4 lb

## 2016-01-07 DIAGNOSIS — I5032 Chronic diastolic (congestive) heart failure: Secondary | ICD-10-CM

## 2016-01-07 DIAGNOSIS — I10 Essential (primary) hypertension: Secondary | ICD-10-CM | POA: Diagnosis not present

## 2016-01-07 NOTE — Patient Instructions (Signed)
Medication Instructions:  Your physician recommends that you continue on your current medications as directed. Please refer to the Current Medication list given to you today.  Labwork: No newoOrders  Testing/Procedures: No new orders  Follow-Up: Your physician wants you to follow-up in: 6 months with Dr Fletcher Anon. You will receive a reminder letter in the mail two months in advance. If you don't receive a letter, please call our office to schedule the follow-up appointment.   Any Other Special Instructions Will Be Listed Below (If Applicable).     If you need a refill on your cardiac medications before your next appointment, please call your pharmacy.

## 2016-01-07 NOTE — Progress Notes (Signed)
Cardiology Office Note   Date:  01/07/2016   ID:  Kerry Barnes, DOB 1934-04-10, MRN [id number]  PCP:  Tula Nakayama  Cardiologist:   Kathlyn Sacramento, MD   Chief Complaint  Patient presents with  . Follow-up    test results. pt c/o being tired and having no appetite      History of Present Illness: Kerry Barnes is a 82 y.o. female who s here today for follow-up visit regarding chronic diastolic heart failure and palpitations due to PACs. She has history of chronic diastolic heart failure(with moderate pulmonary hypertension),  Hypertension, mitral regurgitation, hypertension, hyperlipidemia, TIA (on Plavix for that indication) as well as diabetes mellitus. Previous echocardiogram in 2012 showed normal LV systolic function with mild mitral regurgitation. There was moderate diastolic dysfunction and pulmonary hypertension.  She underwent a Lexiscan nuclear stress test in 09/2013 which was normal. ABI was done for atypical leg pain and was normal.  Carotid Doppler in November 2016 showed mild nonobstructive bilateral disease. She was seen recently for intermittent episodes of palpitations and tachycardia.  She also had increased exertional dyspnea. She underwent a 48-hour Holter monitor which showed PACs without other significant arrhythmia. 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   the patient reports stable symptoms. She came back from [place] recently after a 10 day trip to visit her grandkids. She has no chest pain. Exertional dyspnea is stable and she no longer is having palpitations. There is no strength back  Past Medical History:  Diagnosis Date  . Allergy   . Anxiety   . Arthritis   . Asthma   . Blood transfusion without reported diagnosis   . Cataract   . Diabetes mellitus    . Fibromyalgia   . GERD (gastroesophageal reflux disease)   . Hyperlipemia   . Hypertension   . IBS (irritable bowel syndrome)   . Stroke [facility]) 2013   TIA  . Tubular adenoma of colon     Past Surgical History:  Procedure Laterality Date  . ANKLE FRACTURE SURGERY    . CHOLECYSTECTOMY    . TUBAL LIGATION    . WRIST FRACTURE SURGERY       Current Outpatient Prescriptions  Medication Sig Dispense Refill  . albuterol (PROVENTIL HFA;VENTOLIN HFA) 108 (90 BASE) MCG/ACT inhaler Inhale into the lungs every 6 (six) hours as needed for wheezing or shortness of breath.    . ALPRAZolam (XANAX) 0.5 MG tablet Take 0.5 mg by mouth 2 (two) times daily as needed for anxiety.    Marland Kitchen amLODipine (NORVASC) 5 MG tablet Take 5 mg by mouth daily.    Marland Kitchen atorvastatin (LIPITOR) 40 MG tablet Take 40 mg by mouth daily.    . clobetasol ointment (TEMOVATE) [id number] % Apply 1 application topically 2 (two) times daily.    . clopidogrel (PLAVIX) 75 MG tablet Take 75 mg by mouth daily.    . cyclobenzaprine (FLEXERIL) 10 MG tablet Take 10 mg by mouth as directed.    Marland Kitchen EPINEPHrine (EPI-PEN) 0.3 mg/0.3 mL SOAJ injection Inject into the muscle once.    Marland Kitchen FLUOCINOLONE ACETONIDE BODY 0.01 % OIL Apply 1 application topically daily.    . Fluticasone-Salmeterol (ADVAIR) 100-50 MCG/DOSE AEPB Inhale 1 puff into the lungs every 12 (twelve) hours.    . insulin glargine (LANTUS) 100 UNIT/ML injection Inject 24 Units into the skin  at bedtime.     Marland Kitchen LINZESS 145 MCG CAPS capsule TAKE 1 CAPSULE(145 MCG) BY MOUTH DAILY 30 capsule 3  . meclizine (ANTIVERT) 25 MG tablet Take 25 mg by mouth 3 (three) times daily as needed for dizziness.     . mometasone (NASONEX) 50 MCG/ACT nasal spray Place 2 sprays into the nose daily.    Marland Kitchen olmesartan-hydrochlorothiazide (BENICAR HCT) 40-25 MG per tablet Take 1 tablet by mouth every morning.    Marland Kitchen olopatadine (PATANOL) 0.1 % ophthalmic solution Place 1 drop into both eyes 2 (two) times daily.     .  pantoprazole (PROTONIX) 40 MG tablet Take 1 tablet (40 mg total) by mouth daily. 90 tablet 3  . permethrin (ELIMITE) 5 % cream Apply 1 application topically daily.    . pregabalin (LYRICA) 75 MG capsule Take 75 mg by mouth 2 (two) times daily.    . traMADol (ULTRAM) 50 MG tablet Take 1 tablet by mouth as needed.     No current facility-administered medications for this visit.     Allergies:   Codeine; Metformin; Metformin and related; Penicillins; Amoxicillin; Biaxin [clarithromycin]; Ciprofloxacin; Eggs or egg-derived products; and Penicillin g    Social History:  The patient  reports that she has never smoked. She has never used smokeless tobacco. She reports that she does not drink alcohol or use drugs.   Family History:  The patient's family history includes Diabetes in her brother, father, mother, and sister.    ROS:  Please see the history of present illness.   Otherwise, review of systems are positive for none.   All other systems are reviewed and negative.    PHYSICAL EXAM: VS:  BP (!) 100/59 (BP Location: Left Arm, Patient Position: Sitting, Cuff Size: Normal)   Pulse (!) 59   Ht 5\' 1"  (1.549 m)   Wt 150 lb 6.4 oz (68.2 kg)   SpO2 98%   BMI 28.42 kg/m  , BMI Body mass index is 28.42 kg/m. GEN: Well nourished, well developed, in no acute distress  HEENT: normal  Neck: no JVD, carotid bruits, or masses Cardiac: RRR; no murmurs, rubs, or gallops,no edema  Respiratory:  clear to auscultation bilaterally, normal work of breathing GI: soft, nontender, nondistended, + BS MS: no deformity or atrophy  Skin: warm and dry, no rash Neuro:  Strength and sensation are intact Psych: euthymic mood, full affect   EKG:  EKG is ordered today. The ekg ordered today demonstrates normal sinus rhythm with lateral T-wave changes which are not new.   Recent Labs: 09/10/2015: ALT 17; BUN 18; Creatinine, Ser 1.20; Hemoglobin 11.9; Platelets 297.0; Potassium 3.9; Sodium 139    Lipid  Panel    Component Value Date/Time   CHOL (H) 09/09/2010 0110    206        ATP III CLASSIFICATION:  <200     mg/dL   Desirable  200-239  mg/dL   Borderline High  >=240    mg/dL   High          TRIG 129 09/09/2010 0110   HDL 51 09/09/2010 0110   CHOLHDL 4.0 09/09/2010 0110   VLDL 26 09/09/2010 0110   LDLCALC (H) 09/09/2010 0110    129        Total Cholesterol/HDL:CHD Risk Coronary Heart Disease Risk Table                     Men   Women  1/2 Average Risk  3.4   3.3  Average Risk       5.0   4.4  2 X Average Risk   9.6   7.1  3 X Average Risk  23.4   11.0        Use the calculated Patient Ratio above and the CHD Risk Table to determine the patient's CHD Risk.        ATP III CLASSIFICATION (LDL):  <100     mg/dL   Optimal  100-129  mg/dL   Near or Above                    Optimal  130-159  mg/dL   Borderline  160-189  mg/dL   High  >190     mg/dL   Very High      Wt Readings from Last 3 Encounters:  01/07/16 150 lb 6.4 oz (68.2 kg)  11/05/15 147 lb (66.7 kg)  09/10/15 143 lb 12.8 oz (65.2 kg)        ASSESSMENT AND PLAN:  1.  Palpitations: Due to PACs. The patient is at high risk for atrial fibrillation given prolonged history of hypertension, chronic diastolic heart failure and dilated left atrium. She has regular rhythm today. Continue to monitor closely. If she develops recurrent palpitations, I recommend a 30 day monitor.  2. Chronic diastolic heart failure: Recent echocardiogram showed elevated filling pressures but clinically she appears to be euvolemic. She is already on hydrochlorothiazide and thus I decided not to add furosemide. Blood pressure is well controlled.  3. Essential hypertension: Blood pressure is well controlled on current medications.    Disposition:   FU with me in 6 months  Signed, Kathlyn Sacramento, MD  01/07/2016 8:26 AM    [place]

## 2016-01-20 DIAGNOSIS — R42 Dizziness and giddiness: Secondary | ICD-10-CM | POA: Diagnosis not present

## 2016-01-20 DIAGNOSIS — I959 Hypotension, unspecified: Secondary | ICD-10-CM | POA: Diagnosis not present

## 2016-01-24 ENCOUNTER — Encounter (INDEPENDENT_AMBULATORY_CARE_PROVIDER_SITE_OTHER): Payer: PPO | Admitting: Ophthalmology

## 2016-01-24 DIAGNOSIS — E11319 Type 2 diabetes mellitus with unspecified diabetic retinopathy without macular edema: Secondary | ICD-10-CM | POA: Diagnosis not present

## 2016-01-24 DIAGNOSIS — I10 Essential (primary) hypertension: Secondary | ICD-10-CM

## 2016-01-24 DIAGNOSIS — H35033 Hypertensive retinopathy, bilateral: Secondary | ICD-10-CM

## 2016-01-24 DIAGNOSIS — E113291 Type 2 diabetes mellitus with mild nonproliferative diabetic retinopathy without macular edema, right eye: Secondary | ICD-10-CM

## 2016-01-24 DIAGNOSIS — E113392 Type 2 diabetes mellitus with moderate nonproliferative diabetic retinopathy without macular edema, left eye: Secondary | ICD-10-CM

## 2016-01-24 DIAGNOSIS — H43813 Vitreous degeneration, bilateral: Secondary | ICD-10-CM

## 2016-01-24 DIAGNOSIS — H348122 Central retinal vein occlusion, left eye, stable: Secondary | ICD-10-CM

## 2016-01-31 ENCOUNTER — Encounter (HOSPITAL_COMMUNITY): Payer: Self-pay | Admitting: Emergency Medicine

## 2016-01-31 ENCOUNTER — Inpatient Hospital Stay (HOSPITAL_COMMUNITY)
Admission: EM | Admit: 2016-01-31 | Discharge: 2016-02-05 | DRG: 309 | Disposition: A | Discharge status: Home / Self Care | Payer: PPO | Attending: Internal Medicine | Admitting: Internal Medicine

## 2016-01-31 ENCOUNTER — Emergency Department (HOSPITAL_COMMUNITY): Payer: PPO

## 2016-01-31 DIAGNOSIS — I4891 Unspecified atrial fibrillation: Secondary | ICD-10-CM | POA: Diagnosis present

## 2016-01-31 DIAGNOSIS — N183 Chronic kidney disease, stage 3 unspecified: Secondary | ICD-10-CM | POA: Diagnosis present

## 2016-01-31 DIAGNOSIS — N189 Chronic kidney disease, unspecified: Secondary | ICD-10-CM | POA: Diagnosis present

## 2016-01-31 DIAGNOSIS — E785 Hyperlipidemia, unspecified: Secondary | ICD-10-CM | POA: Diagnosis not present

## 2016-01-31 DIAGNOSIS — I959 Hypotension, unspecified: Secondary | ICD-10-CM | POA: Diagnosis present

## 2016-01-31 DIAGNOSIS — Z7951 Long term (current) use of inhaled steroids: Secondary | ICD-10-CM | POA: Diagnosis not present

## 2016-01-31 DIAGNOSIS — Z7901 Long term (current) use of anticoagulants: Secondary | ICD-10-CM | POA: Diagnosis not present

## 2016-01-31 DIAGNOSIS — E782 Mixed hyperlipidemia: Secondary | ICD-10-CM | POA: Diagnosis present

## 2016-01-31 DIAGNOSIS — N179 Acute kidney failure, unspecified: Secondary | ICD-10-CM | POA: Diagnosis present

## 2016-01-31 DIAGNOSIS — I480 Paroxysmal atrial fibrillation: Secondary | ICD-10-CM | POA: Diagnosis not present

## 2016-01-31 DIAGNOSIS — I130 Hypertensive heart and chronic kidney disease with heart failure and stage 1 through stage 4 chronic kidney disease, or unspecified chronic kidney disease: Secondary | ICD-10-CM | POA: Diagnosis not present

## 2016-01-31 DIAGNOSIS — I10 Essential (primary) hypertension: Secondary | ICD-10-CM | POA: Diagnosis not present

## 2016-01-31 DIAGNOSIS — I4892 Unspecified atrial flutter: Secondary | ICD-10-CM | POA: Diagnosis present

## 2016-01-31 DIAGNOSIS — G459 Transient cerebral ischemic attack, unspecified: Secondary | ICD-10-CM | POA: Diagnosis not present

## 2016-01-31 DIAGNOSIS — E1122 Type 2 diabetes mellitus with diabetic chronic kidney disease: Secondary | ICD-10-CM | POA: Diagnosis not present

## 2016-01-31 DIAGNOSIS — M797 Fibromyalgia: Secondary | ICD-10-CM | POA: Diagnosis present

## 2016-01-31 DIAGNOSIS — Z794 Long term (current) use of insulin: Secondary | ICD-10-CM | POA: Diagnosis not present

## 2016-01-31 DIAGNOSIS — I272 Other secondary pulmonary hypertension: Secondary | ICD-10-CM | POA: Diagnosis not present

## 2016-01-31 DIAGNOSIS — Z8673 Personal history of transient ischemic attack (TIA), and cerebral infarction without residual deficits: Secondary | ICD-10-CM | POA: Diagnosis not present

## 2016-01-31 DIAGNOSIS — Z7902 Long term (current) use of antithrombotics/antiplatelets: Secondary | ICD-10-CM

## 2016-01-31 DIAGNOSIS — I5032 Chronic diastolic (congestive) heart failure: Secondary | ICD-10-CM | POA: Diagnosis not present

## 2016-01-31 DIAGNOSIS — I340 Nonrheumatic mitral (valve) insufficiency: Secondary | ICD-10-CM | POA: Diagnosis not present

## 2016-01-31 DIAGNOSIS — E119 Type 2 diabetes mellitus without complications: Secondary | ICD-10-CM | POA: Diagnosis not present

## 2016-01-31 DIAGNOSIS — R079 Chest pain, unspecified: Secondary | ICD-10-CM | POA: Diagnosis not present

## 2016-01-31 DIAGNOSIS — I361 Nonrheumatic tricuspid (valve) insufficiency: Secondary | ICD-10-CM | POA: Diagnosis not present

## 2016-01-31 LAB — PROTIME-INR
INR: 1.01
Prothrombin Time: 13.3 seconds (ref 11.4–15.2)

## 2016-01-31 LAB — I-STAT CHEM 8, ED
BUN: 23 mg/dL — ABNORMAL HIGH (ref 6–20)
Calcium, Ion: 1.18 mmol/L (ref 1.12–1.23)
Chloride: 106 mmol/L (ref 101–111)
Creatinine, Ser: 1.6 mg/dL — ABNORMAL HIGH (ref 0.44–1.00)
Glucose, Bld: 143 mg/dL — ABNORMAL HIGH (ref 65–99)
HCT: 47 % — ABNORMAL HIGH (ref 36.0–46.0)
Hemoglobin: 16 g/dL — ABNORMAL HIGH (ref 12.0–15.0)
Potassium: 4.8 mmol/L (ref 3.5–5.1)
Sodium: 143 mmol/L (ref 135–145)
TCO2: 25 mmol/L (ref 0–100)

## 2016-01-31 LAB — I-STAT CG4 LACTIC ACID, ED: Lactic Acid, Venous: 2.31 mmol/L (ref 0.5–1.9)

## 2016-01-31 LAB — TROPONIN I
Troponin I: <0.03 ng/mL (ref <0.03)
Troponin I: <0.03 ng/mL (ref <0.03)

## 2016-01-31 LAB — LACTIC ACID, PLASMA: Lactic Acid, Venous: 1.6 mmol/L (ref 0.5–1.9)

## 2016-01-31 LAB — CBC
HCT: 44.4 % (ref 36.0–46.0)
Hemoglobin: 14.1 g/dL (ref 12.0–15.0)
MCH: 26.9 pg (ref 26.0–34.0)
MCHC: 31.8 g/dL (ref 30.0–36.0)
MCV: 84.7 fL (ref 78.0–100.0)
Platelets: 373 10*3/uL (ref 150–400)
RBC: 5.24 MIL/uL — ABNORMAL HIGH (ref 3.87–5.11)
RDW: 14.2 % (ref 11.5–15.5)
WBC: 8.5 10*3/uL (ref 4.0–10.5)

## 2016-01-31 LAB — BASIC METABOLIC PANEL
Anion gap: 9 (ref 5–15)
BUN: 15 mg/dL (ref 6–20)
CO2: 23 mmol/L (ref 22–32)
Calcium: 10.2 mg/dL (ref 8.9–10.3)
Chloride: 107 mmol/L (ref 101–111)
Creatinine, Ser: 1.53 mg/dL — ABNORMAL HIGH (ref 0.44–1.00)
GFR calc Af Amer: 36 mL/min — ABNORMAL LOW (ref >60)
GFR calc non Af Amer: 31 mL/min — ABNORMAL LOW (ref >60)
Glucose, Bld: 141 mg/dL — ABNORMAL HIGH (ref 65–99)
Potassium: 4.5 mmol/L (ref 3.5–5.1)
Sodium: 139 mmol/L (ref 135–145)

## 2016-01-31 LAB — I-STAT TROPONIN, ED: Troponin i, poc: 0.03 ng/mL (ref 0.00–0.08)

## 2016-01-31 LAB — BRAIN NATRIURETIC PEPTIDE: B Natriuretic Peptide: 150.9 pg/mL — ABNORMAL HIGH (ref 0.0–100.0)

## 2016-01-31 LAB — GLUCOSE, CAPILLARY: Glucose-Capillary: 170 mg/dL — ABNORMAL HIGH (ref 65–99)

## 2016-01-31 LAB — TSH: TSH: 1.457 u[IU]/mL (ref 0.350–4.500)

## 2016-01-31 LAB — MRSA PCR SCREENING: MRSA by PCR: NEGATIVE

## 2016-01-31 MED ORDER — MOMETASONE FURO-FORMOTEROL FUM 100-5 MCG/ACT IN AERO
2.0000 | INHALATION_SPRAY | Freq: Two times a day (BID) | RESPIRATORY_TRACT | Status: DC
  Administered 2016-01-31 – 2016-02-05 (×9): 2 via RESPIRATORY_TRACT
  Filled 2016-01-31: qty 8.8

## 2016-01-31 MED ORDER — PANTOPRAZOLE SODIUM 40 MG PO TBEC
40.0000 mg | DELAYED_RELEASE_TABLET | Freq: Every day | ORAL | Status: DC
  Administered 2016-02-01 – 2016-02-05 (×5): 40 mg via ORAL
  Filled 2016-01-31 (×5): qty 1

## 2016-01-31 MED ORDER — OFF THE BEAT BOOK
Freq: Once | Status: AC
Start: 2016-01-31 — End: 2016-02-01
  Administered 2016-02-01: 05:00:00
  Filled 2016-01-31: qty 1

## 2016-01-31 MED ORDER — ASPIRIN 81 MG PO CHEW
324.0000 mg | CHEWABLE_TABLET | Freq: Once | ORAL | Status: AC
  Administered 2016-01-31: 324 mg via ORAL
  Filled 2016-01-31: qty 4

## 2016-01-31 MED ORDER — PREGABALIN 50 MG PO CAPS
75.0000 mg | ORAL_CAPSULE | Freq: Two times a day (BID) | ORAL | Status: DC
  Administered 2016-01-31 – 2016-02-05 (×10): 75 mg via ORAL
  Filled 2016-01-31 (×10): qty 1

## 2016-01-31 MED ORDER — ALBUTEROL SULFATE (2.5 MG/3ML) 0.083% IN NEBU: 3.0000 mL | INHALATION_SOLUTION | Freq: Four times a day (QID) | RESPIRATORY_TRACT | Status: DC | PRN

## 2016-01-31 MED ORDER — DILTIAZEM LOAD VIA INFUSION
10.0000 mg | Freq: Once | INTRAVENOUS | Status: AC
  Administered 2016-01-31: 10 mg via INTRAVENOUS
  Filled 2016-01-31: qty 10

## 2016-01-31 MED ORDER — ONDANSETRON HCL 4 MG/2ML IJ SOLN: 4.0000 mg | Freq: Four times a day (QID) | INTRAMUSCULAR | Status: DC | PRN

## 2016-01-31 MED ORDER — DEXTROSE 5 % IV SOLN
5.0000 mg/h | INTRAVENOUS | Status: DC
  Administered 2016-01-31 – 2016-02-01 (×2): 5 mg/h via INTRAVENOUS
  Filled 2016-01-31: qty 100

## 2016-01-31 MED ORDER — ACETAMINOPHEN 325 MG PO TABS: 650.0000 mg | ORAL_TABLET | ORAL | Status: DC | PRN

## 2016-01-31 MED ORDER — INSULIN ASPART 100 UNIT/ML ~~LOC~~ SOLN
0.0000 [IU] | Freq: Three times a day (TID) | SUBCUTANEOUS | Status: DC
  Administered 2016-02-01 – 2016-02-03 (×6): 2 [IU] via SUBCUTANEOUS

## 2016-01-31 MED ORDER — INSULIN GLARGINE 100 UNIT/ML ~~LOC~~ SOLN
24.0000 [IU] | Freq: Every day | SUBCUTANEOUS | Status: DC
  Administered 2016-01-31 – 2016-02-04 (×5): 24 [IU] via SUBCUTANEOUS
  Filled 2016-01-31 (×7): qty 0.24

## 2016-01-31 MED ORDER — CLOPIDOGREL BISULFATE 75 MG PO TABS
75.0000 mg | ORAL_TABLET | Freq: Every day | ORAL | Status: DC
  Administered 2016-02-01 – 2016-02-03 (×3): 75 mg via ORAL
  Filled 2016-01-31 (×3): qty 1

## 2016-01-31 MED ORDER — APIXABAN 5 MG PO TABS
5.0000 mg | ORAL_TABLET | Freq: Two times a day (BID) | ORAL | Status: DC
  Administered 2016-01-31 – 2016-02-05 (×11): 5 mg via ORAL
  Filled 2016-01-31 (×12): qty 1

## 2016-01-31 MED ORDER — LINACLOTIDE 145 MCG PO CAPS
145.0000 ug | ORAL_CAPSULE | Freq: Every day | ORAL | Status: DC
  Administered 2016-02-01 – 2016-02-05 (×4): 145 ug via ORAL
  Filled 2016-01-31 (×4): qty 1

## 2016-01-31 MED ORDER — ATORVASTATIN CALCIUM 40 MG PO TABS
40.0000 mg | ORAL_TABLET | Freq: Every day | ORAL | Status: DC
  Administered 2016-02-01 – 2016-02-05 (×5): 40 mg via ORAL
  Filled 2016-01-31 (×5): qty 1

## 2016-01-31 NOTE — ED Provider Notes (Signed)
[facility] DEPT Provider Note   CSN: [id number] Arrival date & time: 01/31/16  1147     History   Chief Complaint Chief Complaint  Patient presents with  . Tachycardia    HPI Kerry Barnes is a 82 y.o. female.  Level V caveat for acuity of condition. Patient presents with racing heart and palpitations since last night. Also has intermittent episodes of left-sided chest pain coming go lasting several minutes at a time. States pain last a few minutes and goes away. No chest pain currently. She measured her heart rate home and it was 150. On arrival she is pale, diaphoretic, hypotensive at 89/65. She is found in rapid atrial flutter on EKG with ST elevation inferiorly. She denies any history of atrial fibrillation or atrial flutter.   The history is provided by the patient and the spouse. The history is limited by the condition of the patient.    Past Medical History:  Diagnosis Date  . Allergy   . Anxiety   . Arthritis   . Asthma   . Blood transfusion without reported diagnosis   . Cataract   . Diabetes mellitus   . Fibromyalgia   . GERD (gastroesophageal reflux disease)   . Hyperlipemia   . Hypertension   . IBS (irritable bowel syndrome)   . Stroke [facility]) 2013   TIA  . Tubular adenoma of colon     Patient Active Problem List   Diagnosis Date Noted  . Left carotid bruit 04/23/2015  . Leg pain 10/10/2013  . CHEST PAIN UNSPECIFIED 03/03/2010  . Chronic diastolic heart failure ([place]) 11/06/2009  . PULMONARY HYPERTENSION, SECONDARY 07/04/2009  . ARM NUMBNESS 07/04/2009  . DIABETES MELLITUS, TYPE II 07/03/2009  . Hyperlipidemia 07/03/2009  . MITRAL REGURGITATION, MILD 07/03/2009  . Essential hypertension 07/03/2009  . GASTROPARESIS 07/03/2009  . DIVERTICULOSIS, COLON, WITH HEMORRHAGE 07/03/2009  . CONSTIPATION 07/03/2009  . IBS 07/03/2009  . ARTHRITIS 07/03/2009  . MIGRAINES, HX OF 07/03/2009  . EUSTACHIAN TUBE DYSFUNCTION 04/04/2007  . ALLERGIC  RHINITIS 04/04/2007  . ASTHMA 04/04/2007  . ESOPHAGITIS, REFLUX 04/04/2007    Past Surgical History:  Procedure Laterality Date  . ANKLE FRACTURE SURGERY    . CHOLECYSTECTOMY    . TUBAL LIGATION    . WRIST FRACTURE SURGERY      OB History    No data available       Home Medications    Prior to Admission medications   Medication Sig Start Date End Date Taking? Authorizing Provider  albuterol (PROVENTIL HFA;VENTOLIN HFA) 108 (90 BASE) MCG/ACT inhaler Inhale into the lungs every 6 (six) hours as needed for wheezing or shortness of breath.    Historical Provider, MD  ALPRAZolam Duanne Moron) 0.5 MG tablet Take 0.5 mg by mouth 2 (two) times daily as needed for anxiety.    Historical Provider, MD  amLODipine (NORVASC) 5 MG tablet Take 5 mg by mouth daily.    Historical Provider, MD  atorvastatin (LIPITOR) 40 MG tablet Take 40 mg by mouth daily.    Historical Provider, MD  clobetasol ointment (TEMOVATE) [id number] % Apply 1 application topically 2 (two) times daily.    Historical Provider, MD  clopidogrel (PLAVIX) 75 MG tablet Take 75 mg by mouth daily.    Historical Provider, MD  cyclobenzaprine (FLEXERIL) 10 MG tablet Take 10 mg by mouth as directed.    Historical Provider, MD  EPINEPHrine (EPI-PEN) 0.3 mg/0.3 mL SOAJ injection Inject into the muscle once.    Historical Provider, MD  FLUOCINOLONE ACETONIDE BODY 0.01 % OIL Apply 1 application topically daily. 09/19/15   Historical Provider, MD  Fluticasone-Salmeterol (ADVAIR) 100-50 MCG/DOSE AEPB Inhale 1 puff into the lungs every 12 (twelve) hours.    Historical Provider, MD  insulin glargine (LANTUS) 100 UNIT/ML injection Inject 24 Units into the skin at bedtime.     Historical Provider, MD  LINZESS 145 MCG CAPS capsule TAKE 1 CAPSULE(145 MCG) BY MOUTH DAILY 09/13/15   Jerene Bears, MD  meclizine (ANTIVERT) 25 MG tablet Take 25 mg by mouth 3 (three) times daily as needed for dizziness.     Historical Provider, MD  mometasone (NASONEX) 50 MCG/ACT  nasal spray Place 2 sprays into the nose daily.    Historical Provider, MD  olmesartan-hydrochlorothiazide (BENICAR HCT) 40-25 MG per tablet Take 1 tablet by mouth every morning.    Historical Provider, MD  olopatadine (PATANOL) 0.1 % ophthalmic solution Place 1 drop into both eyes 2 (two) times daily.     Historical Provider, MD  pantoprazole (PROTONIX) 40 MG tablet Take 1 tablet (40 mg total) by mouth daily. 10/10/12   Jerene Bears, MD  permethrin (ELIMITE) 5 % cream Apply 1 application topically daily. 10/30/15   Historical Provider, MD  pregabalin (LYRICA) 75 MG capsule Take 75 mg by mouth 2 (two) times daily.    Historical Provider, MD  traMADol (ULTRAM) 50 MG tablet Take 1 tablet by mouth as needed. 09/18/15   Historical Provider, MD    Family History Family History  Problem Relation Age of Onset  . Diabetes Mother   . Diabetes Father   . Diabetes Sister   . Diabetes Brother   . Colon cancer Neg Hx     Social History Social History  Substance Use Topics  . Smoking status: Never Smoker  . Smokeless tobacco: Never Used  . Alcohol use No     Allergies   Codeine; Metformin; Metformin and related; Penicillins; Amoxicillin; Biaxin [clarithromycin]; Ciprofloxacin; Eggs or egg-derived products; and Penicillin g   Review of Systems Review of Systems  Unable to perform ROS: Acuity of condition     Physical Exam Updated Vital Signs BP (!) 89/65 (BP Location: Left Arm)   Pulse (!) 146   Temp 97.7 F (36.5 C) (Oral)   Resp 22   Ht 5\' 1"  (1.549 m)   Wt 145 lb (65.8 kg)   SpO2 100%   BMI 27.40 kg/m   Physical Exam  Constitutional: She is oriented to person, place, and time.  Pale, fatigued  HENT:  Head: Normocephalic and atraumatic.  Right Ear: External ear normal.  Left Ear: External ear normal.  Mouth/Throat: Oropharynx is clear and moist.  Eyes: Conjunctivae and EOM are normal. Pupils are equal, round, and reactive to light.  Neck: Normal range of motion.    Cardiovascular: Normal rate, normal heart sounds and intact distal pulses.   Regular tachycardia  Pulmonary/Chest: Effort normal and breath sounds normal. No respiratory distress. She exhibits no tenderness.  Abdominal: Soft. She exhibits no mass. There is no tenderness. There is no guarding.  Musculoskeletal: Normal range of motion. She exhibits no edema, tenderness or deformity.  Neurological: She is alert and oriented to person, place, and time. She has normal reflexes. No cranial nerve deficit. Coordination normal.  Skin: Skin is warm. Capillary refill takes less than 2 seconds. She is diaphoretic.     ED Treatments / Results  Labs (all labs ordered are listed, but only abnormal results are displayed) Labs Reviewed  BASIC METABOLIC PANEL - Abnormal; Notable for the following:       Result Value   Glucose, Bld 141 (*)    Creatinine, Ser 1.53 (*)    GFR calc non Af Amer 31 (*)    GFR calc Af Amer 36 (*)    All other components within normal limits  CBC - Abnormal; Notable for the following:    RBC 5.24 (*)    All other components within normal limits  BRAIN NATRIURETIC PEPTIDE - Abnormal; Notable for the following:    B Natriuretic Peptide 150.9 (*)    All other components within normal limits  I-STAT CG4 LACTIC ACID, ED - Abnormal; Notable for the following:    Lactic Acid, Venous 2.31 (*)    All other components within normal limits  I-STAT CHEM 8, ED - Abnormal; Notable for the following:    BUN 23 (*)    Creatinine, Ser 1.60 (*)    Glucose, Bld 143 (*)    Hemoglobin 16.0 (*)    HCT 47.0 (*)    All other components within normal limits  TROPONIN I  PROTIME-INR  I-STAT TROPOININ, ED  I-STAT CG4 LACTIC ACID, ED    EKG  EKG Interpretation  Date/Time:  Friday January 31 2016 12:41:16 EDT Ventricular Rate:  106 PR Interval:    QRS Duration: 99 QT Interval:  326 QTC Calculation: 417 R Axis:   79 Text Interpretation:  Atrial flutter Repol abnrm, severe global  ischemia (LM/MVD) rate has slowed. now diffuse ST depressions, ST elevation avR Confirmed by Wyvonnia Dusky  MD, [place] [phone]) on 01/31/2016 12:54:43 PM       Radiology Dg Chest Portable 1 View  Result Date: 01/31/2016 CLINICAL DATA:  Hypertension. EXAM: PORTABLE CHEST 1 VIEW COMPARISON:  09/08/2010 FINDINGS: Lungs are clear. Heart and mediastinum are within normal limits. The trachea is midline. No acute bone abnormality. IMPRESSION: No active disease. Electronically Signed   By: Markus Daft [name]   On: 01/31/2016 12:41    Procedures Procedures (including critical care time)  Medications Ordered in ED Medications  diltiazem (CARDIZEM) 1 mg/mL load via infusion 10 mg (not administered)    And  diltiazem (CARDIZEM) 100 mg in dextrose 5 % 100 mL (1 mg/mL) infusion (not administered)  aspirin chewable tablet 324 mg (324 mg Oral Given 01/31/16 1213)     Initial Impression / Assessment and Plan / ED Course  I have reviewed the triage vital signs and the nursing notes.  Pertinent labs & imaging results that were available during my care of the patient were reviewed by me and considered in my medical decision making (see chart for details).  Clinical Course  Patient rapid atrial tachycardia since last night with intermittent chest pain. On arrival she is diaphoretic pale and hypotensive. EKG with ST elevation inferiorly. This was discussed with Dr. Ellyn Hack on arrival. He feels this is likely due to her rapid rates rather than ST elevation MI. Her T-wave inversions laterally are old.  Patient's blood pressure has improved to [id number] systolic. Will not cardiovert at this time as she is mentating well. We'll treat rapid atrial flutter with IV Cardizem and cardiology will evaluate to see if her ST elevations improved.  HR improving to 80s on IV cardizem. EKG now with diffuse ST depression and elevatin in aVR. NO further chest pain.  Dr. Ellyn Hack updated Troponin negative Cr slightly elevated from previous.  Small fluid challenge.  Cardiology to see patient and admit for new onset rapid  atrial flutter and EKG ischemic changes.  CRITICAL CARE Performed by: Ezequiel Essex Total critical care time: 35 minutes Critical care time was exclusive of separately billable procedures and treating other patients. Critical care was necessary to treat or prevent imminent or life-threatening deterioration. Critical care was time spent personally by me on the following activities: development of treatment plan with patient and/or surrogate as well as nursing, discussions with consultants, evaluation of patient's response to treatment, examination of patient, obtaining history from patient or surrogate, ordering and performing treatments and interventions, ordering and review of laboratory studies, ordering and review of radiographic studies, pulse oximetry and re-evaluation of patient's condition.    Final Clinical Impressions(s) / ED Diagnoses   Final diagnoses:  Atrial fibrillation with RVR (HCC)  Chest pain, unspecified chest pain type    New Prescriptions New Prescriptions   No medications on file     Ezequiel Essex, MD 01/31/16 1701

## 2016-01-31 NOTE — ED Notes (Signed)
Pharmacist at bedside providing education on Eliquis.

## 2016-01-31 NOTE — ED Notes (Signed)
Pt undressed, placed on 12-lead as well as radiolucent pads per MD request.

## 2016-01-31 NOTE — ED Notes (Signed)
Attempted to call report

## 2016-01-31 NOTE — H&P (Signed)
History & Physical    Patient ID: Kerry Barnes MRN: [id number], DOB/AGE: 10-05-1933   Admit date: 01/31/2016   Primary Physician: Tula Nakayama Primary Cardiologist: Dr. Fletcher Anon  Patient Profile    82 yo female with PMH of Diastolic heart failure, palpitations related to PACs, moderate pulmonary hypertension, hypertension, mitral regurg, hyperlipidemia, TIA currently on Plavix, and diabetes mellitus type 2 who presented to the [facility] ED with complaints of centralized chest pain, and feeling like her heart was racing.  Past Medical History    Past Medical History:  Diagnosis Date  . Allergy   . Anxiety   . Arthritis   . Asthma   . Blood transfusion without reported diagnosis   . Cataract   . Diabetes mellitus   . Fibromyalgia   . GERD (gastroesophageal reflux disease)   . Hyperlipemia   . Hypertension   . IBS (irritable bowel syndrome)   . Stroke [facility]) 2013   TIA  . Tubular adenoma of colon     Past Surgical History:  Procedure Laterality Date  . ANKLE FRACTURE SURGERY    . CHOLECYSTECTOMY    . TUBAL LIGATION    . WRIST FRACTURE SURGERY       Allergies  Allergies  Allergen Reactions  . Codeine Nausea Only  . Metformin Diarrhea  . Metformin And Related   . Penicillins   . Amoxicillin Rash    REACTION: unspecified REACTION: unspecified  . Biaxin [Clarithromycin] Rash  . Ciprofloxacin Rash  . Eggs Or Egg-Derived Products Rash    Had welps as a child.  Has eaten eggs recently with no problems. Had welps as a child.  Has eaten eggs recently with no problems.  . Penicillin G Rash    History of Present Illness    Kerry Barnes is a 82 year old female patient of Dr. Tyrell Antonio with past medical history of Diastolic heart failure, palpitations related to PACs, moderate pulmonary hypertension, hypertension, mitral regurg, hyperlipidemia, TIA currently on Plavix, and diabetes mellitus type 2. Last 2-D echo on 11/20/2015 showed EF of 60-65%, with no  wall motion abnormality, and grade 2 diastolic dysfunction. Her left atrium was also noted to be severely dilated, with PA peak pressure of 55 mmHg. Her last stress test was in 10/26/2013 which is noted to be normal. She was last seen in the office by Dr. Fletcher Anon on 01/07/2016 where she was noted to be in her usual state of health. Prior to that visit had worn a 48 hour Holter monitor which showed PACs without any other significant arrhythmia. At that time she reported her history of exertional dyspnea stable at that time. It is noted in the office notes that she is considered high risk for atrial fibrillation given her long history of hypertension, chronic diastolic heart failure and dilated left atrium. Was noted to be in a regular rhythm at the office, but plantar possible 30 day event monitor if she did develop recurrent palpitations. Of note does report having a heart catheterization possibly about 20 years ago, she notes was normal.  States she has been feeling "off" for the past couple weeks. Reported she was seen in her PCP office last Monday and her heart rate and blood pressure were noted to be normal. States yesterday afternoon around 6:45 PM she developed centralized chest pressure, and proceeded to take her blood pressure which she noted was low. States that she continued to feel bad for about an hour and rechecked her blood pressure and heart rate  at that time and noted that her heart rate was around 145 bpm. States that her symptoms did subside that evening, but woke up this morning and was getting ready in her home whenever she developed this chest pain again and felt like her heart was pounding. States she did "hear" her heart rate, and he is to monitor again to check and noted that her heart rate was 150 bpm. She attempted to find someone to transfer her to the ER, since her husband was not currently at home. Proceeded to wait until her husband came back this morning and was able to take her to the  ER.  In the ER her EKG showed A. flutter RVR with rates in the 140s with ST elevation noted in the inferior leads. Patient was also pale and diaphoretic along with hypertensive. She was started on a IV Cardizem drip, with a 10 mg bolus, and 324 of aspirin. This ultimately reduced her heart rate down into the mid 80s, but remains to be in A. flutter. Once her heart rate stabilized, patient reporting feeling much better without report of chest pain or dyspnea. Repeat EKG shows diffuse ST depression with ST elevation in aVR. Her blood pressures also stabilized. Her lab work showed stable electrolytes, elevated creatinine of 1.6, troponins negative 2, elevated hemoglobin of 16, and lactic acid of 2.31. Chest x-ray showed no active disease.    Home Medications    Prior to Admission medications   Medication Sig Start Date End Date Taking? Authorizing Provider  albuterol (PROVENTIL HFA;VENTOLIN HFA) 108 (90 BASE) MCG/ACT inhaler Inhale into the lungs every 6 (six) hours as needed for wheezing or shortness of breath.    Historical Provider, MD  ALPRAZolam Duanne Moron) 0.5 MG tablet Take 0.5 mg by mouth 2 (two) times daily as needed for anxiety.    Historical Provider, MD  amLODipine (NORVASC) 5 MG tablet Take 5 mg by mouth daily.    Historical Provider, MD  atorvastatin (LIPITOR) 40 MG tablet Take 40 mg by mouth daily.    Historical Provider, MD  clobetasol ointment (TEMOVATE) [id number] % Apply 1 application topically 2 (two) times daily.    Historical Provider, MD  clopidogrel (PLAVIX) 75 MG tablet Take 75 mg by mouth daily.    Historical Provider, MD  cyclobenzaprine (FLEXERIL) 10 MG tablet Take 10 mg by mouth as directed.    Historical Provider, MD  EPINEPHrine (EPI-PEN) 0.3 mg/0.3 mL SOAJ injection Inject into the muscle once.    Historical Provider, MD  FLUOCINOLONE ACETONIDE BODY 0.01 % OIL Apply 1 application topically daily. 09/19/15   Historical Provider, MD  Fluticasone-Salmeterol (ADVAIR) 100-50 MCG/DOSE  AEPB Inhale 1 puff into the lungs every 12 (twelve) hours.    Historical Provider, MD  insulin glargine (LANTUS) 100 UNIT/ML injection Inject 24 Units into the skin at bedtime.     Historical Provider, MD  LINZESS 145 MCG CAPS capsule TAKE 1 CAPSULE(145 MCG) BY MOUTH DAILY 09/13/15   Jerene Bears, MD  meclizine (ANTIVERT) 25 MG tablet Take 25 mg by mouth 3 (three) times daily as needed for dizziness.     Historical Provider, MD  mometasone (NASONEX) 50 MCG/ACT nasal spray Place 2 sprays into the nose daily.    Historical Provider, MD  olmesartan-hydrochlorothiazide (BENICAR HCT) 40-25 MG per tablet Take 1 tablet by mouth every morning.    Historical Provider, MD  olopatadine (PATANOL) 0.1 % ophthalmic solution Place 1 drop into both eyes 2 (two) times daily.  Historical Provider, MD  pantoprazole (PROTONIX) 40 MG tablet Take 1 tablet (40 mg total) by mouth daily. 10/10/12   Jerene Bears, MD  permethrin (ELIMITE) 5 % cream Apply 1 application topically daily. 10/30/15   Historical Provider, MD  pregabalin (LYRICA) 75 MG capsule Take 75 mg by mouth 2 (two) times daily.    Historical Provider, MD  traMADol (ULTRAM) 50 MG tablet Take 1 tablet by mouth as needed. 09/18/15   Historical Provider, MD    Family History    Family History  Problem Relation Age of Onset  . Diabetes Mother   . Diabetes Father   . Diabetes Sister   . Diabetes Brother   . Colon cancer Neg Hx     Social History    Social History   Social History  . Marital status: Married    Spouse name: N/A  . Number of children: 8  . Years of education: N/A   Occupational History  . Housewife Retired   Social History Main Topics  . Smoking status: Never Smoker  . Smokeless tobacco: Never Used  . Alcohol use No  . Drug use: No  . Sexual activity: Not on file   Other Topics Concern  . Not on file   Social History Narrative  . No narrative on file     Review of Systems    General:  No chills, fever, night sweats or  weight changes.  Cardiovascular:  Se HPI Dermatological: No rash, lesions/masses Respiratory: No cough, dyspnea Urologic: No hematuria, dysuria Abdominal:   No nausea, vomiting, diarrhea, bright red blood per rectum, melena, or hematemesis Neurologic:  No visual changes, wkns, changes in mental status. All other systems reviewed and are otherwise negative except as noted above.  Physical Exam    Blood pressure 130/80, pulse 106, temperature 97.7 F (36.5 C), temperature source Oral, resp. rate 16, height 5\' 1"  (1.549 m), weight 145 lb (65.8 kg), SpO2 97 %.  General: Pleasant older AA female, NAD Psych: Normal affect. Neuro: Alert and oriented X 3. Moves all extremities spontaneously. HEENT: Normal  Neck: Supple without bruits or JVD. Lungs:  Resp regular and unlabored, CTA. Heart: Irregularly irregular no s3, s4, 2/6 systolic murmur. Abdomen: Soft, non-tender, non-distended, BS + x 4.  Extremities: No clubbing, cyanosis, trace bilateral lower extremity edema. DP/PT/Radials 2+ and equal bilaterally.  Labs    Troponin [facility] of Care Test)  Recent Labs  01/31/16 1214  TROPIPOC 0.03    Recent Labs  01/31/16 1206  TROPONINI <0.03   Lab Results  Component Value Date   WBC 8.5 01/31/2016   HGB 16.0 (H) 01/31/2016   HCT 47.0 (H) 01/31/2016   MCV 84.7 01/31/2016   PLT 373 01/31/2016     Recent Labs Lab 01/31/16 1206 01/31/16 1216  NA 139 143  K 4.5 4.8  CL 107 106  CO2 23  --   BUN 15 23*  CREATININE 1.53* 1.60*  CALCIUM 10.2  --   GLUCOSE 141* 143*   Lab Results  Component Value Date   CHOL (H) 09/09/2010    206        ATP III CLASSIFICATION:  <200     mg/dL   Desirable  200-239  mg/dL   Borderline High  >=240    mg/dL   High          HDL 51 09/09/2010   LDLCALC (H) 09/09/2010    129        Total Cholesterol/HDL:CHD  Risk Coronary Heart Disease Risk Table                     Men   Women  1/2 Average Risk   3.4   3.3  Average Risk       5.0   4.4  2  X Average Risk   9.6   7.1  3 X Average Risk  23.4   11.0        Use the calculated Patient Ratio above and the CHD Risk Table to determine the patient's CHD Risk.        ATP III CLASSIFICATION (LDL):  <100     mg/dL   Optimal  100-129  mg/dL   Near or Above                    Optimal  130-159  mg/dL   Borderline  160-189  mg/dL   High  >190     mg/dL   Very High   TRIG 129 09/09/2010   No results found for: [facility]   Radiology Studies    Dg Chest Portable 1 View  Result Date: 01/31/2016 CLINICAL DATA:  Hypertension. EXAM: PORTABLE CHEST 1 VIEW COMPARISON:  09/08/2010 FINDINGS: Lungs are clear. Heart and mediastinum are within normal limits. The trachea is midline. No acute bone abnormality. IMPRESSION: No active disease. Electronically Signed   By: Markus Daft [name]   On: 01/31/2016 12:41    ECG & Cardiac Imaging    EKG: A-Flutter  Echo: 11/20/2015  Study Conclusions  - Left ventricle: The cavity size was normal. Wall thickness was   normal. Systolic function was normal. The estimated ejection   fraction was in the range of 60% to 65%. Wall motion was normal;   there were no regional wall motion abnormalities. Features are   consistent with a pseudonormal left ventricular filling pattern,   with concomitant abnormal relaxation and increased filling   pressure (grade 2 diastolic dysfunction). E/medial e&' > 15   suggests LV end diastolic pressure at least 20 mmHg. - Aortic valve: There was no stenosis. - Mitral valve: Hockey-stick appearance to the anterior mitral   leaflet, suggestive of rheumatic involvement. Possible mild   mitral stenosis, but mean gradient and valve area not measured.   There was mild regurgitation. - Left atrium: The atrium was severely dilated. - Right ventricle: The cavity size was normal. Systolic function   was normal. - Right atrium: The atrium was mildly dilated. - Pulmonary arteries: PA peak pressure: 54 mm Hg (S). - Inferior vena cava: The  vessel was normal in size. The   respirophasic diameter changes were in the normal range (= 50%),   consistent with normal central venous pressure.  Impressions:  - Normal LV size with EF 60-65%. Moderate diastolic dysfunction   with evidence for elevated LV filling pressure. Normal RV size   and systolic function. Rheumatic-appearing mitral valve   (hockey-stick anterior leaflet) with mild MR, suspect mild mitral   stenosis but gradient and valve area not measured on this study.   Moderate pulmonary hypertension.  Assessment & Plan    82 yo female with PMH of Diastolic heart failure, palpitations related to PACs, moderate pulmonary hypertension, hypertension, mitral regurg, hyperlipidemia, TIA currently on Plavix, and diabetes mellitus type 2 who presented to the [facility] ED with complaints of centralized chest pain, and feeling like her heart was racing.  1. A-Flutter with RVR: Reports feeling "off" for  the past couple of weeks. Yesterday afternoon developed centralized chest pain and noted her HR to be in the 140s. This eventually subsided on its own, but returned again this morning after she woke up. Took HR again and noted 150 bpm. Presented to the ED and found to be in new onset A-flutter RVR, pale, diaphoretic and hypotensive. Given 10mg  Cardizem bolus, and started on drip. Rate improved, and reported feeling much better. BP stabilized. Trop neg x2. Chest xray negative. This patients CHA2DS2-VASc Score and unadjusted Ischemic Stroke Rate (% per year) is equal to 10.8 % stroke rate/year from a score of 8Above score calculated as 1 point each if present [ CHF,HTN, DM, Vascular=MI/PAD/Aortic Plaque, Age if 65-74, or Female] Above score calculated as 2 points each if present [Age > 75, or Stroke/TIA/TE] -- Admit to stepdown -- Check 2D echo, TSH, and cycle trops -- Start Eliquis pharmacy to dose given renal funx, continue Cardizem drip with plans to transition once HR remains stable.    2. HTN: Stable, hold norvasc since on dilt drip, hold benicar in the setting of AKI   3. HLD: continue statin  4. TIA: continue Plavix, and statin  5. DM: SSI  6. AKI: Morning BMET, hold ARB  Signed, Reino Bellis, NP-C Pager [phone] 01/31/2016, 2:06 PM  Patient seen and examined  I agree with findings as noted by L Mancel Bale above  Pt is a 82 yo with history of diastolic CHF presents with chest pain, heart racing   On arrival pt found to be in aflutter/fib with RVR  Rates have imprved with medical Rx as have the pt's symptoms  She is comfortable without CP   On exam  Lungs CTA  Cardaic Irreg Irreg  No S3  Ext without edema  Plan to admit for medical management  Continue meds to control rate  Start anticoagulation Echo to evaluate LV/LA    Dorris Carnes

## 2016-01-31 NOTE — ED Triage Notes (Signed)
PT hr in 140's.  C/o L chest pain and dizziness.

## 2016-01-31 NOTE — Progress Notes (Signed)
ANTICOAGULATION CONSULT NOTE - Initial Consult  Pharmacy Consult for apixaban Indication: aflutter  Allergies  Allergen Reactions  . Codeine Nausea Only  . Metformin Diarrhea  . Metformin And Related   . Penicillins   . Amoxicillin Rash    REACTION: unspecified REACTION: unspecified  . Biaxin [Clarithromycin] Rash  . Ciprofloxacin Rash  . Eggs Or Egg-Derived Products Rash    Had welps as a child.  Has eaten eggs recently with no problems. Had welps as a child.  Has eaten eggs recently with no problems.  . Penicillin G Rash    Patient Measurements: Height: 5\' 1"  (154.9 cm) Weight: 145 lb (65.8 kg) IBW/kg (Calculated) : 47.8   Vital Signs: Temp: 97.7 F (36.5 C) (08/18 1154) Temp Source: Oral (08/18 1154) BP: 130/80 (08/18 1330) Pulse Rate: 106 (08/18 1330)  Labs:  Recent Labs  01/31/16 1206 01/31/16 1216 01/31/16 1234  HGB 14.1 16.0*  --   HCT 44.4 47.0*  --   PLT 373  --   --   LABPROT  --   --  13.3  INR  --   --  1.01  CREATININE 1.53* 1.60*  --   TROPONINI <0.03  --   --     Estimated Creatinine Clearance: 24.8 mL/min (by C-G formula based on SCr of 1.6 mg/dL).   Medical History: Past Medical History:  Diagnosis Date  . Allergy   . Anxiety   . Arthritis   . Asthma   . Blood transfusion without reported diagnosis   . Cataract   . Diabetes mellitus   . Fibromyalgia   . GERD (gastroesophageal reflux disease)   . Hyperlipemia   . Hypertension   . IBS (irritable bowel syndrome)   . Stroke [facility]) 2013   TIA  . Tubular adenoma of colon     Assessment: 82 yo F with new onset aflutter w/ RVR.  Pharmacy consulted to dose apixaban for stroke prevention. Age 67, Wt 65,8 kg, creat 1.6.  Will turn 82 on October 1[birth year]. Creat was 1.2 on 09/10/15.    Plan: apixaban 5 mg po BID Her dose will need to decrease to 2.5 mg po BID on [birth year] 82th birthday if her creatinine remains > 1.5 Pt educated Eudelia Bunch, Pharm.D. BP:[id number] 01/31/2016 2:32 PM

## 2016-01-31 NOTE — ED Notes (Signed)
MD at bedside. 

## 2016-01-31 NOTE — Plan of Care (Signed)
Problem: Education: Goal: Knowledge of [facility] Education information/materials will improve Outcome: Progressing Patient aware of plan of care.  RN provided medication education to patient on all medications administered thus far this shift.  Patient stated understanding.  Patient has denied pain thus far this shift.  RN instructed patient to notify staff if she started to experience any pain, patient stated understanding.  RN instructed patient to call and wait for staff assistance prior to getting out of bed due to tethering equipment.  Patient stated understanding and has called and waited appropriately thus far this shift.

## 2016-01-31 NOTE — ED Notes (Signed)
Ria Comment, Cards NP,  at bedside.

## 2016-02-01 ENCOUNTER — Observation Stay (HOSPITAL_BASED_OUTPATIENT_CLINIC_OR_DEPARTMENT_OTHER): Payer: PPO

## 2016-02-01 DIAGNOSIS — I10 Essential (primary) hypertension: Secondary | ICD-10-CM | POA: Diagnosis not present

## 2016-02-01 DIAGNOSIS — N189 Chronic kidney disease, unspecified: Secondary | ICD-10-CM

## 2016-02-01 DIAGNOSIS — N179 Acute kidney failure, unspecified: Secondary | ICD-10-CM

## 2016-02-01 DIAGNOSIS — I4892 Unspecified atrial flutter: Secondary | ICD-10-CM | POA: Diagnosis not present

## 2016-02-01 DIAGNOSIS — R079 Chest pain, unspecified: Secondary | ICD-10-CM | POA: Diagnosis present

## 2016-02-01 LAB — BASIC METABOLIC PANEL
Anion gap: 8 (ref 5–15)
BUN: 18 mg/dL (ref 6–20)
CO2: 25 mmol/L (ref 22–32)
Calcium: 9.4 mg/dL (ref 8.9–10.3)
Chloride: 106 mmol/L (ref 101–111)
Creatinine, Ser: 1.26 mg/dL — ABNORMAL HIGH (ref 0.44–1.00)
GFR calc Af Amer: 46 mL/min — ABNORMAL LOW (ref >60)
GFR calc non Af Amer: 39 mL/min — ABNORMAL LOW (ref >60)
Glucose, Bld: 116 mg/dL — ABNORMAL HIGH (ref 65–99)
Potassium: 4.2 mmol/L (ref 3.5–5.1)
Sodium: 139 mmol/L (ref 135–145)

## 2016-02-01 LAB — GLUCOSE, CAPILLARY
Glucose-Capillary: 125 mg/dL — ABNORMAL HIGH (ref 65–99)
Glucose-Capillary: 99 mg/dL (ref 65–99)
Glucose-Capillary: 135 mg/dL — ABNORMAL HIGH (ref 65–99)
Glucose-Capillary: 211 mg/dL — ABNORMAL HIGH (ref 65–99)

## 2016-02-01 LAB — TROPONIN I
Troponin I: <0.03 ng/mL (ref <0.03)
Troponin I: <0.03 ng/mL (ref <0.03)

## 2016-02-01 LAB — CBC
HCT: 39.8 % (ref 36.0–46.0)
Hemoglobin: 12.5 g/dL (ref 12.0–15.0)
MCH: 26.5 pg (ref 26.0–34.0)
MCHC: 31.2 g/dL (ref 30.0–36.0)
MCV: 85 fL (ref 78.0–100.0)
Platelets: 314 10*3/uL (ref 150–400)
RBC: 4.68 MIL/uL (ref 3.87–5.11)
RDW: 14.3 % (ref 11.5–15.5)
WBC: 8.5 10*3/uL (ref 4.0–10.5)

## 2016-02-01 LAB — ECHOCARDIOGRAM COMPLETE
Height: 61 in
Weight: 2444.8 oz

## 2016-02-01 LAB — LACTIC ACID, PLASMA: Lactic Acid, Venous: 1.7 mmol/L (ref 0.5–1.9)

## 2016-02-01 MED ORDER — DILTIAZEM HCL 60 MG PO TABS
60.0000 mg | ORAL_TABLET | Freq: Four times a day (QID) | ORAL | Status: DC
  Administered 2016-02-01 – 2016-02-03 (×11): 60 mg via ORAL
  Filled 2016-02-01 (×10): qty 1

## 2016-02-01 NOTE — Progress Notes (Signed)
Echocardiogram 2D Echocardiogram has been performed.  Kerry Barnes 02/01/2016, 1:05 PM

## 2016-02-01 NOTE — Progress Notes (Signed)
SUBJECTIVE:   No complaints  OBJECTIVE:   Vitals:   Vitals:   02/01/16 0815 02/01/16 0920 02/01/16 1059 02/01/16 1100  BP: (!) 118/58  (!) 121/56   Pulse: (!) 48  98   Resp: 18     Temp: 99 F (37.2 C)  98.5 F (36.9 C) 98.5 F (36.9 C)  TempSrc: Oral  Oral   SpO2: 100% 96% 100%   Weight:      Height:       I&O's:   Intake/Output Summary (Last 24 hours) at 02/01/16 1109 Last data filed at 02/01/16 0759  Gross per 24 hour  Intake             1020 ml  Output              750 ml  Net              270 ml   TELEMETRY: Reviewed telemetry pt in atrial flutter with HR 90's:     PHYSICAL EXAM General: Well developed, well nourished, in no acute distress Head: Eyes PERRLA, No xanthomas.   Normal cephalic and atramatic  Lungs:   Clear bilaterally to auscultation and percussion. Heart:   Irregularly irregular S1 S2 Pulses are 2+ & equal. Abdomen: Bowel sounds are positive, abdomen soft and non-tender without masses  Msk:  Back normal, normal gait. Normal strength and tone for age. Extremities:   No clubbing, cyanosis or edema.  DP +1 Neuro: Alert and oriented X 3. Psych:  Good affect, responds appropriately   LABS: Basic Metabolic Panel:  Recent Labs  01/31/16 1206 01/31/16 1216 02/01/16 0625  NA 139 143 139  K 4.5 4.8 4.2  CL 107 106 106  CO2 23  --  25  GLUCOSE 141* 143* 116*  BUN 15 23* 18  CREATININE 1.53* 1.60* 1.26*  CALCIUM 10.2  --  9.4   Liver Function Tests: No results for input(s): AST, ALT, ALKPHOS, BILITOT, PROT, ALBUMIN in the last 72 hours. No results for input(s): LIPASE, AMYLASE in the last 72 hours. CBC:  Recent Labs  01/31/16 1206 01/31/16 1216 02/01/16 0625  WBC 8.5  --  8.5  HGB 14.1 16.0* 12.5  HCT 44.4 47.0* 39.8  MCV 84.7  --  85.0  PLT 373  --  314   Cardiac Enzymes:  Recent Labs  01/31/16 1849 02/01/16 0023 02/01/16 0625  TROPONINI <0.03 <0.03 <0.03   BNP: Invalid input(s): POCBNP D-Dimer: No results for  input(s): DDIMER in the last 72 hours. Hemoglobin A1C: No results for input(s): HGBA1C in the last 72 hours. Fasting Lipid Panel: No results for input(s): CHOL, HDL, LDLCALC, TRIG, CHOLHDL, LDLDIRECT in the last 72 hours. Thyroid Function Tests:  Recent Labs  01/31/16 1849  TSH 1.457   Anemia Panel: No results for input(s): VITAMINB12, FOLATE, FERRITIN, TIBC, IRON, RETICCTPCT in the last 72 hours. Coag Panel:   Lab Results  Component Value Date   INR 1.01 01/31/2016   INR 0.96 09/08/2010   INR 0.99 09/08/2010    RADIOLOGY: Dg Chest Portable 1 View  Result Date: 01/31/2016 CLINICAL DATA:  Hypertension. EXAM: PORTABLE CHEST 1 VIEW COMPARISON:  09/08/2010 FINDINGS: Lungs are clear. Heart and mediastinum are within normal limits. The trachea is midline. No acute bone abnormality. IMPRESSION: No active disease. Electronically Signed   By: Markus Daft [name]   On: 01/31/2016 12:41    Assessment & Plan    82 yo female with PMH of Diastolic heart  failure, palpitations related to PACs, moderate pulmonary hypertension, hypertension, mitral regurg, hyperlipidemia, TIA currently on Plavix, and diabetes mellitus type 2 who presented to the [facility] ED with complaints of centralized chest pain, and feeling like her heart was racing.  1. A-Flutter with RVR: Reports feeling "off" for the past couple of weeks. Developed centralized chest pain and noted her HR to be in the 140s. This eventually subsided on its own, but returned again the morning of admission after she woke up. Took HR again and noted 150 bpm. Presented to the ED and found to be in new onset A-flutter RVR, pale, diaphoretic and hypotensive. Given 10mg  Cardizem bolus, and started on drip. Rate improved, and reported feeling much better. BP stabilized. Trop neg x2. Chest xray negative. This patients CHA2DS2-VASc Score and unadjusted Ischemic Stroke Rate (% per year) is equal to 10.8 % stroke rate/year from a score of 8. Above score  calculated as 1 point each if present [ CHF,HTN, DM, Vascular=MI/PAD/Aortic Plaque, Age if 75-74, or Female]. Above score calculated as 2 points each if present [Age > 75, or Stroke/TIA/TE]. -- Trop neg x 2 and TSH normal -- Started on Eliquis per pharmacy dosing given renal function. Renal function normalized now.  Wt 69kg and Creatinine < 1.5 so will continue at 5mg  BID. -- HR stable in the 90's on IV Cardizem.  Will stop gtt and start Cardizem  -- 2D echo pending  2. HTN: BP well controlled.  Stopped norvasc as we are adding on PO Cardizem.  Continue to hold ARB for now due to creatinine 1.26.  3. HLD: continue statin  4. TIA: continue Plavix, and statin  5. DM: SSI  6. AKI: improved and close to baseline.  Suspect due to transient hypotension.   [name], MD  02/01/2016  11:09 AM

## 2016-02-02 DIAGNOSIS — I10 Essential (primary) hypertension: Secondary | ICD-10-CM | POA: Diagnosis not present

## 2016-02-02 DIAGNOSIS — I4892 Unspecified atrial flutter: Secondary | ICD-10-CM | POA: Diagnosis not present

## 2016-02-02 LAB — GLUCOSE, CAPILLARY
Glucose-Capillary: 130 mg/dL — ABNORMAL HIGH (ref 65–99)
Glucose-Capillary: 129 mg/dL — ABNORMAL HIGH (ref 65–99)
Glucose-Capillary: 135 mg/dL — ABNORMAL HIGH (ref 65–99)
Glucose-Capillary: 129 mg/dL — ABNORMAL HIGH (ref 65–99)

## 2016-02-02 MED ORDER — METOPROLOL TARTRATE 5 MG/5ML IV SOLN: 5.0000 mg | Freq: Four times a day (QID) | INTRAVENOUS | Status: DC | PRN

## 2016-02-02 NOTE — Progress Notes (Addendum)
SUBJECTIVE:  No complaints  OBJECTIVE:   Vitals:   Vitals:   02/01/16 2334 02/02/16 0609 02/02/16 0727 02/02/16 0917  BP: 107/64 104/67    Pulse: (!) 106 75    Resp: (!) 21 16    Temp: 97.8 F (36.6 C) 98.6 F (37 C) 98.4 F (36.9 C)   TempSrc: Oral Oral Oral   SpO2: 100% 97%  99%  Weight:  152 lb (68.9 kg)    Height:       I&O's:   Intake/Output Summary (Last 24 hours) at 02/02/16 0934 Last data filed at 02/02/16 [id number]  Gross per 24 hour  Intake              268 ml  Output              650 ml  Net             -382 ml   TELEMETRY: Reviewed telemetry pt in atrial flutter  PHYSICAL EXAM General: Well developed, well nourished, in no acute distress Head: Eyes PERRLA, No xanthomas.   Normal cephalic and atramatic  Lungs:   Clear bilaterally to auscultation and percussion. Heart:   Irregularly irregular S1 S2 Pulses are 2+ & equal. Abdomen: Bowel sounds are positive, abdomen soft and non-tender without masses Msk:  Back normal, normal gait. Normal strength and tone for age. Extremities:   No clubbing, cyanosis or edema.  DP +1 Neuro: Alert and oriented X 3. Psych:  Good affect, responds appropriately   LABS: Basic Metabolic Panel:  Recent Labs  01/31/16 1206 01/31/16 1216 02/01/16 0625  NA 139 143 139  K 4.5 4.8 4.2  CL 107 106 106  CO2 23  --  25  GLUCOSE 141* 143* 116*  BUN 15 23* 18  CREATININE 1.53* 1.60* 1.26*  CALCIUM 10.2  --  9.4   Liver Function Tests: No results for input(s): AST, ALT, ALKPHOS, BILITOT, PROT, ALBUMIN in the last 72 hours. No results for input(s): LIPASE, AMYLASE in the last 72 hours. CBC:  Recent Labs  01/31/16 1206 01/31/16 1216 02/01/16 0625  WBC 8.5  --  8.5  HGB 14.1 16.0* 12.5  HCT 44.4 47.0* 39.8  MCV 84.7  --  85.0  PLT 373  --  314   Cardiac Enzymes:  Recent Labs  01/31/16 1849 02/01/16 0023 02/01/16 0625  TROPONINI <0.03 <0.03 <0.03   BNP: Invalid input(s): POCBNP D-Dimer: No results for input(s):  DDIMER in the last 72 hours. Hemoglobin A1C: No results for input(s): HGBA1C in the last 72 hours. Fasting Lipid Panel: No results for input(s): CHOL, HDL, LDLCALC, TRIG, CHOLHDL, LDLDIRECT in the last 72 hours. Thyroid Function Tests:  Recent Labs  01/31/16 1849  TSH 1.457   Anemia Panel: No results for input(s): VITAMINB12, FOLATE, FERRITIN, TIBC, IRON, RETICCTPCT in the last 72 hours. Coag Panel:   Lab Results  Component Value Date   INR 1.01 01/31/2016   INR 0.96 09/08/2010   INR 0.99 09/08/2010    RADIOLOGY: Dg Chest Portable 1 View  Result Date: 01/31/2016 CLINICAL DATA:  Hypertension. EXAM: PORTABLE CHEST 1 VIEW COMPARISON:  09/08/2010 FINDINGS: Lungs are clear. Heart and mediastinum are within normal limits. The trachea is midline. No acute bone abnormality. IMPRESSION: No active disease. Electronically Signed   By: Markus Daft [name]   On: 01/31/2016 12:41    Assessment & Plan   82 yo female with PMH of Diastolic heart failure, palpitations related to PACs, moderate pulmonary  hypertension, hypertension, mitral regurg, hyperlipidemia, TIA currently on Plavix, and diabetes mellitus type 2 who presented to the [facility] ED with complaints of centralized chest pain, and feeling like her heart was racing.  1. A-Flutter with [id number] feeling "off" for the past couple of weeks. Developed centralized chest pain and noted her HR to be in the 140s. This eventually subsided on its own, but returned again the morning of admission after she woke up. Took HR again and noted 150 bpm. Presented to the ED and found to be in new onset A-flutter RVR, pale, diaphoretic and hypotensive. Given 10mg  Cardizem bolus, and started on drip. Rate improved, and reported feeling much better. BP stabilized. Trop neg x3. Chest xray negative. This patients CHA2DS2-VASc Score and unadjusted Ischemic Stroke Rate (% per year) is equal to 10.8 % stroke rate/year from a score of 8. Above score calculated as 1  point each if present [ CHF,HTN,DM,Vascular=MI/PAD/Aortic Plaque, Age if 56-74, or Female]. Above score calculated as 2 points each if present [Age >75,or Stroke/TIA/TE]. -- Trop neg x 3 and TSH normal -- Started on Eliquis per pharmacy dosing given renal function. Renal function normalized now.  Wt 69kg and Creatinine < 1.5 so will continue at 5mg  BID. -- HR stable in the 90's on PO Cardizem. Change to Cardizem CD 240mg  daily.  -- 2D echo shows normal LVF with EF 65-70% with moderate to severely dilated LA and mild pulmonary HTN - PASP 67mmHg.    2. HTN:BP well controlled.  Stopped norvasc as we are adding on PO Cardizem.  Continue to hold ARB for now due to creatinine 1.26.  3. HLD: continue statin  4. [id number] Plavix, and statin  5. DM:SSI  6. AKI: improved and close to baseline.  Suspect due to transient hypotension.   Patient says that she is too weak to be discharged today so will get her up walking in hall today and plan to send home tomorrow.  [name], MD  02/02/2016  9:34 AM

## 2016-02-02 NOTE — Progress Notes (Signed)
Pt ambulated 125 feet in hall, HR before ambulation 100 - 120, 143 while ambulating, sa02 100%, reported SOB, gait steady with cane.  Edward Qualia RN

## 2016-02-03 DIAGNOSIS — I340 Nonrheumatic mitral (valve) insufficiency: Secondary | ICD-10-CM | POA: Diagnosis not present

## 2016-02-03 DIAGNOSIS — I5032 Chronic diastolic (congestive) heart failure: Secondary | ICD-10-CM | POA: Diagnosis present

## 2016-02-03 DIAGNOSIS — I10 Essential (primary) hypertension: Secondary | ICD-10-CM | POA: Diagnosis not present

## 2016-02-03 DIAGNOSIS — I959 Hypotension, unspecified: Secondary | ICD-10-CM | POA: Diagnosis present

## 2016-02-03 DIAGNOSIS — Z7902 Long term (current) use of antithrombotics/antiplatelets: Secondary | ICD-10-CM | POA: Diagnosis not present

## 2016-02-03 DIAGNOSIS — I130 Hypertensive heart and chronic kidney disease with heart failure and stage 1 through stage 4 chronic kidney disease, or unspecified chronic kidney disease: Secondary | ICD-10-CM | POA: Diagnosis present

## 2016-02-03 DIAGNOSIS — I4892 Unspecified atrial flutter: Secondary | ICD-10-CM | POA: Diagnosis present

## 2016-02-03 DIAGNOSIS — R079 Chest pain, unspecified: Secondary | ICD-10-CM | POA: Diagnosis present

## 2016-02-03 DIAGNOSIS — Z794 Long term (current) use of insulin: Secondary | ICD-10-CM | POA: Diagnosis not present

## 2016-02-03 DIAGNOSIS — E1122 Type 2 diabetes mellitus with diabetic chronic kidney disease: Secondary | ICD-10-CM | POA: Diagnosis present

## 2016-02-03 DIAGNOSIS — G459 Transient cerebral ischemic attack, unspecified: Secondary | ICD-10-CM | POA: Diagnosis not present

## 2016-02-03 DIAGNOSIS — Z8673 Personal history of transient ischemic attack (TIA), and cerebral infarction without residual deficits: Secondary | ICD-10-CM | POA: Diagnosis not present

## 2016-02-03 DIAGNOSIS — I272 Other secondary pulmonary hypertension: Secondary | ICD-10-CM | POA: Diagnosis present

## 2016-02-03 DIAGNOSIS — N179 Acute kidney failure, unspecified: Secondary | ICD-10-CM | POA: Diagnosis present

## 2016-02-03 DIAGNOSIS — Z7901 Long term (current) use of anticoagulants: Secondary | ICD-10-CM | POA: Diagnosis not present

## 2016-02-03 DIAGNOSIS — I4891 Unspecified atrial fibrillation: Secondary | ICD-10-CM | POA: Diagnosis present

## 2016-02-03 DIAGNOSIS — M797 Fibromyalgia: Secondary | ICD-10-CM | POA: Diagnosis present

## 2016-02-03 DIAGNOSIS — N183 Chronic kidney disease, stage 3 (moderate): Secondary | ICD-10-CM | POA: Diagnosis present

## 2016-02-03 DIAGNOSIS — Z7951 Long term (current) use of inhaled steroids: Secondary | ICD-10-CM | POA: Diagnosis not present

## 2016-02-03 DIAGNOSIS — E785 Hyperlipidemia, unspecified: Secondary | ICD-10-CM | POA: Diagnosis present

## 2016-02-03 LAB — GLUCOSE, CAPILLARY
Glucose-Capillary: 97 mg/dL (ref 65–99)
Glucose-Capillary: 146 mg/dL — ABNORMAL HIGH (ref 65–99)
Glucose-Capillary: 106 mg/dL — ABNORMAL HIGH (ref 65–99)
Glucose-Capillary: 125 mg/dL — ABNORMAL HIGH (ref 65–99)

## 2016-02-03 MED ORDER — SODIUM CHLORIDE 0.9 % IV SOLN: INTRAVENOUS | Status: DC

## 2016-02-03 NOTE — Anesthesia Preprocedure Evaluation (Addendum)
Anesthesia Evaluation  Patient identified by MRN, date of birth, ID band Patient awake    Reviewed: Allergy & Precautions, NPO status , Patient's Chart, lab work & pertinent test results  Airway Mallampati: I  TM Distance: >3 FB Neck ROM: Full    Dental  (+) Edentulous Upper, Edentulous Lower   Pulmonary asthma ,    breath sounds clear to auscultation       Cardiovascular hypertension, + dysrhythmias  Rhythm:Irregular Rate:Tachycardia  Normal LV wall thickness with LVEF 65-70%. Indeterminate   diastolic function in the setting of atrial flutter. Moderate to   severe left atrial enlargement. Trivial mitral regurgitation.   Mildly calcified aortic annulus. Mild tricuspid regurgitation   with PASP 38 mmHg.   Neuro/Psych CVA    GI/Hepatic Neg liver ROS, GERD  ,  Endo/Other  diabetes  Renal/GU CRFRenal disease     Musculoskeletal  (+) Arthritis , Fibromyalgia -  Abdominal   Peds  Hematology negative hematology ROS (+)   Anesthesia Other Findings   Reproductive/Obstetrics                            Lab Results  Component Value Date   WBC 8.5 02/01/2016   HGB 12.5 02/01/2016   HCT 39.8 02/01/2016   MCV 85.0 02/01/2016   PLT 314 02/01/2016   Lab Results  Component Value Date   CREATININE 1.26 (H) 02/01/2016   BUN 18 02/01/2016   NA 139 02/01/2016   K 4.2 02/01/2016   CL 106 02/01/2016   CO2 25 02/01/2016    Anesthesia Physical Anesthesia Plan  ASA: III  Anesthesia Plan: MAC   Post-op Pain Management:    Induction: Intravenous  Airway Management Planned: Natural Airway and Nasal Cannula  Additional Equipment:   Intra-op Plan:   Post-operative Plan:   Informed Consent: I have reviewed the patients History and Physical, chart, labs and discussed the procedure including the risks, benefits and alternatives for the proposed anesthesia with the patient or authorized  representative who has indicated his/her understanding and acceptance.     Plan Discussed with: CRNA  Anesthesia Plan Comments:        Anesthesia Quick Evaluation

## 2016-02-03 NOTE — Care Management Obs Status (Signed)
[facility] NOTIFICATION   Patient Details  Name: NORMANDY ZITTEL MRN: [id number] Date of Birth: [birth year]   Medicare Observation Status Notification Given:  Yes    Bethena Roys, RN 02/03/2016, 2:34 PM

## 2016-02-03 NOTE — Progress Notes (Addendum)
ANTICOAGULATION CONSULT NOTE -Follow up  Pharmacy Consult for apixaban Indication: aflutter  Allergies  Allergen Reactions  . Codeine Nausea Only  . Metformin Diarrhea  . Metformin And Related   . Penicillins   . Amoxicillin Rash    REACTION: unspecified REACTION: unspecified  . Biaxin [Clarithromycin] Rash  . Ciprofloxacin Rash  . Eggs Or Egg-Derived Products Rash    Had welps as a child.  Has eaten eggs recently with no problems. Had welps as a child.  Has eaten eggs recently with no problems.  . Penicillin G Rash    Patient Measurements: Height: 5\' 1"  (154.9 cm) Weight: 150 lb 6.4 oz (68.2 kg) IBW/kg (Calculated) : 47.8   Vital Signs: Temp: 97.8 F (36.6 C) (08/21 1628) Temp Source: Oral (08/21 1628) BP: 106/71 (08/21 1628) Pulse Rate: 98 (08/21 1628)  Labs:  Recent Labs  01/31/16 1849 02/01/16 0023 02/01/16 0625  HGB  --   --  12.5  HCT  --   --  39.8  PLT  --   --  314  CREATININE  --   --  1.26*  TROPONINI <0.03 <0.03 <0.03    Estimated Creatinine Clearance: 32 mL/min (by C-G formula based on SCr of 1.26 mg/dL).   Medical History: Past Medical History:  Diagnosis Date  . Allergy   . Anxiety   . Arthritis   . Asthma   . Blood transfusion without reported diagnosis   . Cataract   . Diabetes mellitus   . Fibromyalgia   . GERD (gastroesophageal reflux disease)   . Hyperlipemia   . Hypertension   . IBS (irritable bowel syndrome)   . Stroke [facility]) 2013   TIA  . Tubular adenoma of colon     Assessment: 82 yo Female admitted 01/31/16 with new onset aflutter w/ RVR.  Pharmacy consulted to dose apixaban for stroke prevention. Age 62, Wt 65,8 kg, creat 1.6 then decreased to 1.26 on 8/19th. Wt 69kg and Creatinine <1.5 so will continue at 5mg  BID. The patient will turn 81 y.old on October 1st. .  No bleeding noted. Cardilogist noted today that may need to consider inpatient DCCV as reports pt ambulating in the hallway yesterday and felt very  dyspneic, along with elevation in heart rate into the 140s.    Plan: Continue Apixaban 5 mg po BID Her dose will may to decrease to 2.5 mg po BID on [birth year] 82th birthday if her creatinine remains > 1.5.  Pt educated 8/181/17. Patient was instructed to get renal fxn checked around birthday to see if dose should decrease to 2.5 bid.  Thank you for allowing pharmacy to be part of this patients care team. Nicole Cella, RPh Clinical Pharmacist Pager: [phone]  02/03/2016 5:07 PM

## 2016-02-03 NOTE — Progress Notes (Signed)
Patient Name: Kerry Barnes Date of Encounter: 02/03/2016  Hospital Problem List     Active Problems:   Atrial flutter with rapid ventricular response (HCC)   Pain in the chest   HTN (hypertension), benign   Acute kidney injury superimposed on CKD ([place])    Subjective   Eating breakfast, no further reports of chest pain.   Inpatient Medications    . apixaban  5 mg Oral BID  . atorvastatin  40 mg Oral Daily  . clopidogrel  75 mg Oral Daily  . diltiazem  60 mg Oral Q6H  . insulin aspart  0-15 Units Subcutaneous TID WC  . insulin glargine  24 Units Subcutaneous QHS  . linaclotide  145 mcg Oral QAC breakfast  . mometasone-formoterol  2 puff Inhalation BID  . pantoprazole  40 mg Oral Daily  . pregabalin  75 mg Oral BID    Vital Signs    Vitals:   02/02/16 2021 02/03/16 0047 02/03/16 0500 02/03/16 0744  BP: (!) 146/68 (!) 103/49 (!) 109/54 111/61  Pulse: 78 67 66 66  Resp:  16  18  Temp: 97.9 F (36.6 C) 98.2 F (36.8 C) 98 F (36.7 C) 98 F (36.7 C)  TempSrc: Oral Oral Oral Oral  SpO2: 97% 99% 100% 99%  Weight:   150 lb 6.4 oz (68.2 kg)   Height:        Intake/Output Summary (Last 24 hours) at 02/03/16 0832 Last data filed at 02/02/16 2130  Gross per 24 hour  Intake              240 ml  Output                0 ml  Net              240 ml   Filed Weights   02/01/16 0428 02/02/16 0609 02/03/16 0500  Weight: 152 lb 12.8 oz (69.3 kg) 152 lb (68.9 kg) 150 lb 6.4 oz (68.2 kg)    Physical Exam    General: Pleasant, NAD. Neuro: Alert and oriented X 3. Moves all extremities spontaneously. Psych: Normal affect. HEENT:  Normal  Neck: Supple without bruits or JVD. Lungs:  Resp regular and unlabored, CTA. Heart: Irregularly, irregular no s3, s4, or murmurs. Abdomen: Soft, non-tender, non-distended, BS + x 4.  Extremities: No clubbing, cyanosis or edema. DP/PT/Radials 2+ and equal bilaterally.  Labs    CBC  Recent Labs  01/31/16 1206 01/31/16 1216  02/01/16 0625  WBC 8.5  --  8.5  HGB 14.1 16.0* 12.5  HCT 44.4 47.0* 39.8  MCV 84.7  --  85.0  PLT 373  --  [id number]   Basic Metabolic Panel  Recent Labs  01/31/16 1206 01/31/16 1216 02/01/16 0625  NA 139 143 139  K 4.5 4.8 4.2  CL 107 106 106  CO2 23  --  25  GLUCOSE 141* 143* 116*  BUN 15 23* 18  CREATININE 1.53* 1.60* 1.26*  CALCIUM 10.2  --  9.4   Liver Function Tests No results for input(s): AST, ALT, ALKPHOS, BILITOT, PROT, ALBUMIN in the last 72 hours. No results for input(s): LIPASE, AMYLASE in the last 72 hours. Cardiac Enzymes  Recent Labs  01/31/16 1849 02/01/16 0023 02/01/16 0625  TROPONINI <0.03 <0.03 <0.03   BNP Invalid input(s): POCBNP D-Dimer No results for input(s): DDIMER in the last 72 hours. Hemoglobin A1C No results for input(s): HGBA1C in the last 72 hours. Fasting Lipid  Panel No results for input(s): CHOL, HDL, LDLCALC, TRIG, CHOLHDL, LDLDIRECT in the last 72 hours. Thyroid Function Tests  Recent Labs  01/31/16 1849  TSH 1.457    Telemetry    A-Flutter  ECG    None this morning  Radiology    TTE; 02/01/2016  Study Conclusions  - Left ventricle: The cavity size was normal. Wall thickness was   normal. Systolic function was vigorous. The estimated ejection   fraction was in the range of 65% to 70%. Wall motion was normal;   there were no regional wall motion abnormalities. The study is   not technically sufficient to allow evaluation of LV diastolic   function. - Aortic valve: Mildly calcified annulus. Trileaflet. - Mitral valve: There was trivial regurgitation. - Left atrium: The atrium was moderately to severely dilated. - Right atrium: Central venous pressure (est): 3 mm Hg. - Tricuspid valve: There was mild regurgitation. - Pulmonary arteries: PA peak pressure: 38 mm Hg (S). - Pericardium, extracardiac: There was no pericardial effusion.  Impressions:  - Normal LV wall thickness with LVEF 65-70%. Indeterminate    diastolic function in the setting of atrial flutter. Moderate to   severe left atrial enlargement. Trivial mitral regurgitation.   Mildly calcified aortic annulus. Mild tricuspid regurgitation   with PASP 38 mmHg.  Assessment & Plan    82 yo female with PMH of Diastolic heart failure, palpitations related to PACs, moderate pulmonary hypertension, hypertension, mitral regurg, hyperlipidemia, TIA currently on Plavix, and diabetes mellitus type 2 who presented to the [facility] ED with complaints of centralized chest pain, and feeling like her heart was racing. Found to be in new onset a-flutter RVR.   1. A-Flutter with [id number] feeling "off" for the past couple of weeks. Developed centralized chest pain and noted her HR to be in the 140s. This eventually subsided on its own, but returned again themorning of admissionafter she woke up. Took HR again and noted 150 bpm. Presented to the ED and found to be in new onset A-flutter RVR, pale, diaphoretic and hypotensive. Given 10mg  Cardizem bolus, and started on drip. Rate improved, and reported feeling much better. BP stabilized. Trop neg x3. Chest xray negative. This patients CHA2DS2-VASc Score and unadjusted Ischemic Stroke Rate (% per year) is equal to 10.8 % stroke rate/year from a score of 8. Above score calculated as 1 point each if present [ CHF,HTN,DM,Vascular=MI/PAD/Aortic Plaque, Age if 44-74, or Female]. Above score calculated as 2 points each if present [Age >75,or Stroke/TIA/TE]. -- Trop neg x 3 and TSH normal -- Started onEliquis per pharmacy dosinggiven renal function. Renal function normalized now. Wt 69kg and Creatinine <1.5 so will continue at 5mg  BID. -- HR stable in the 90's on PO Cardizem. Change to Cardizem CD 240mg  daily? -- 2D echo shows normal LVF with EF 65-70% with moderate to severely dilated LA and mild pulmonary HTN - PASP 90mmHg.   -- Reports ambulating in the hallway yesterday and feel very dyspneic, along with  elevation in heart rate into the 140s. States she does feel dyspneic with minimal activity. May need to consider inpatient DCCV, given she has been on Eliquis since 01/31/2016.  2. HTN:BP well controlled. Stopped norvasc as we are adding on PO Cardizem. Continue to hold ARB for now due to creatinine 1.26.  3. HLD: continue statin  4. [id number] Plavix, and statin  5. DM:SSI  6. AKI: improved and close to baseline. Suspect due to transient hypotension.   Signed, Reino Bellis  NP-C Pager [phone]  As above, patient seen and examined.She continues with dyspnea on exertion and her heart rate is elevated at times. Plan to continue Cardizem and apixaban. Given that she remains symptomatic I feel we need to reestablish sinus rhythm during this hospitalization. I have discussed the patient with EP. They feel this is atypical flutter and would be more difficult to ablate. They have recommended TEE guided cardioversion which we will arrange for tomorrow. She can then follow-up with Dr. Rayann Heman after discharge for consideration of left-side flutter ablation.  Kirk Ruths

## 2016-02-04 ENCOUNTER — Inpatient Hospital Stay (HOSPITAL_COMMUNITY): Payer: PPO | Admitting: Anesthesiology

## 2016-02-04 ENCOUNTER — Encounter (HOSPITAL_COMMUNITY): Payer: Self-pay | Admitting: *Deleted

## 2016-02-04 ENCOUNTER — Encounter (HOSPITAL_COMMUNITY)
Admission: EM | Disposition: A | Discharge status: Home / Self Care | Payer: Self-pay | Source: Home / Self Care | Attending: Specialist

## 2016-02-04 ENCOUNTER — Inpatient Hospital Stay (HOSPITAL_COMMUNITY): Payer: PPO

## 2016-02-04 DIAGNOSIS — I340 Nonrheumatic mitral (valve) insufficiency: Secondary | ICD-10-CM

## 2016-02-04 DIAGNOSIS — G459 Transient cerebral ischemic attack, unspecified: Secondary | ICD-10-CM

## 2016-02-04 DIAGNOSIS — I4892 Unspecified atrial flutter: Secondary | ICD-10-CM

## 2016-02-04 DIAGNOSIS — I10 Essential (primary) hypertension: Secondary | ICD-10-CM

## 2016-02-04 HISTORY — PX: CARDIOVERSION: SHX1299

## 2016-02-04 HISTORY — PX: TEE WITHOUT CARDIOVERSION: SHX5443

## 2016-02-04 LAB — GLUCOSE, CAPILLARY
Glucose-Capillary: 104 mg/dL — ABNORMAL HIGH (ref 65–99)
Glucose-Capillary: 99 mg/dL (ref 65–99)
Glucose-Capillary: 114 mg/dL — ABNORMAL HIGH (ref 65–99)
Glucose-Capillary: 120 mg/dL — ABNORMAL HIGH (ref 65–99)
Glucose-Capillary: 135 mg/dL — ABNORMAL HIGH (ref 65–99)

## 2016-02-04 SURGERY — ECHOCARDIOGRAM, TRANSESOPHAGEAL
Anesthesia: Monitor Anesthesia Care

## 2016-02-04 MED ORDER — PROPOFOL 10 MG/ML IV BOLUS
INTRAVENOUS | Status: DC | PRN
  Administered 2016-02-04 (×2): 20 mg via INTRAVENOUS

## 2016-02-04 MED ORDER — DILTIAZEM HCL ER COATED BEADS 180 MG PO CP24
180.0000 mg | ORAL_CAPSULE | Freq: Every day | ORAL | Status: DC
  Administered 2016-02-04 – 2016-02-05 (×2): 180 mg via ORAL
  Filled 2016-02-04 (×2): qty 1

## 2016-02-04 MED ORDER — PHENYLEPHRINE HCL 10 MG/ML IJ SOLN
INTRAMUSCULAR | Status: DC | PRN
  Administered 2016-02-04: 40 ug via INTRAVENOUS
  Administered 2016-02-04: 80 ug via INTRAVENOUS

## 2016-02-04 MED ORDER — BUTAMBEN-TETRACAINE-BENZOCAINE 2-2-14 % EX AERO
INHALATION_SPRAY | CUTANEOUS | Status: DC | PRN
  Administered 2016-02-04: 2 via TOPICAL

## 2016-02-04 MED ORDER — PROPOFOL 500 MG/50ML IV EMUL
INTRAVENOUS | Status: DC | PRN
  Administered 2016-02-04: 75 ug/kg/min via INTRAVENOUS

## 2016-02-04 MED ORDER — LACTATED RINGERS IV SOLN
INTRAVENOUS | Status: DC | PRN
  Administered 2016-02-04: 08:00:00 via INTRAVENOUS

## 2016-02-04 MED ORDER — LIDOCAINE HCL (CARDIAC) 20 MG/ML IV SOLN
INTRAVENOUS | Status: DC | PRN
  Administered 2016-02-04: 80 mg via INTRAVENOUS

## 2016-02-04 NOTE — Care Management Note (Addendum)
Case Management Note  Patient Details  Name: Kerry Barnes MRN: [id number] Date of Birth: 05-Jun-1934  Subjective/Objective: Pt presented with Atrial Flutter/Fib. Plan for d/c home on Eliquis. [place] check completed and CM will make pt aware of cost.  Pt uses [facility] off [address]- medication is in stock. No further needs from CM at this time.               Action/Plan: S/W MARK @ [employer] [facility] # [phone] OPT-2   ELIQUIS 5 MG BID ( 30 )  COVER- YES  CO-PAY- ZERO DOLLARS ( LOW INCOME PLAN )  PRIOR APPROVAL - NO  PHARMACY : [facility]  Expected Discharge Date:                  Expected Discharge Plan:  Home/Self Care  In-House Referral:  NA  Discharge planning Services  CM Consult  Post Acute Care Choice:  NA Choice offered to:  NA  DME Arranged:  N/A DME Agency:  NA  HH Arranged:  NA HH Agency:  NA  Status of Service:  Completed, signed off  If discussed at Long Length of Stay Meetings, dates discussed:    Additional Comments:  Bethena Roys, RN 02/04/2016, 9:39 AM

## 2016-02-04 NOTE — Progress Notes (Signed)
  Echocardiogram Echocardiogram Transesophageal has been performed.  Jennette Dubin 02/04/2016, 8:57 AM

## 2016-02-04 NOTE — CV Procedure (Signed)
    PROCEDURE NOTE:  Procedure:  Transesophageal echocardiogram Operator:  [name], MD Indications:  Atrial flutter Complications: None  During this procedure the patient is administered a total of Propofol 300 mg to achieve and maintain sedation.  The patient's heart rate, blood pressure, and oxygen saturation are monitored continuously during the procedure.   Results: Normal LV size and function Normal RV size and function Normal RA Moderately to severely enlarged LA.  Normal LA appendage with empyting velocity of 40cm/sec.  No evidence of thrombus in LA or LAA. Normal TV with mild to moderate TR Normal PV Normal MV with mild MR Normal trileaflet AV with trivial AR Normal interatrial septum with no evidence of shunt by colorflow dopper  Normal thoracic and ascending aorta. No evidence of intracardiac thrombus.  The patient tolerated the procedure well and went on to DCCV.  Signed: [name], MD [facility]    Electrical Cardioversion Procedure Note JAYLIANIS DURTSCHI [id number] 09-Jun-1934  Procedure: Electrical Cardioversion Indications:  Atrial Flutter  Time Out: Verified patient identification, verified procedure,medications/allergies/relevent history reviewed, required imaging and test results available.  Performed  Procedure Details  The patient was NPO after midnight. Anesthesia was administered  by Dr.Fitzgerald. Cardioversion was done with synchronized biphasic defibrillation with AP pads with 150watts.  The patient converted to normal sinus rhythm. The patient tolerated the procedure well   IMPRESSION:  Successful cardioversion of atrial flutter    Jamonte Curfman 02/04/2016, 8:11 AM

## 2016-02-04 NOTE — Transfer of Care (Signed)
Immediate Anesthesia Transfer of Care Note  Patient: Kerry Barnes  Procedure(s) Performed: Procedure(s): TRANSESOPHAGEAL ECHOCARDIOGRAM (TEE) (N/A) CARDIOVERSION (N/A)  Patient Location: PACU and Endoscopy Unit  Anesthesia Type:MAC  Level of Consciousness: awake, alert  and oriented  Airway & Oxygen Therapy: Patient Spontanous Breathing and Patient connected to nasal cannula oxygen  Post-op Assessment: Report given to RN and Post -op Vital signs reviewed and stable  Post vital signs: Reviewed and stable  Last Vitals:  Vitals:   02/04/16 0740 02/04/16 0844  BP: 123/75 (!) 102/52  Pulse: (!) 109 72  Resp: (!) 25 17  Temp: 36.9 C     Last Pain:  Vitals:   02/04/16 0740  TempSrc: Oral  PainSc:       Patients Stated Pain Goal: 0 ([id number])  Complications: No apparent anesthesia complications

## 2016-02-04 NOTE — Interval H&P Note (Signed)
History and Physical Interval Note:  02/04/2016 8:10 AM  Kerry Barnes  has presented today for surgery, with the diagnosis of AFIB  The various methods of treatment have been discussed with the patient and family. After consideration of risks, benefits and other options for treatment, the patient has consented to  Procedure(s): TRANSESOPHAGEAL ECHOCARDIOGRAM (TEE) (N/A) CARDIOVERSION (N/A) as a surgical intervention .  The patient's history has been reviewed, patient examined, no change in status, stable for surgery.  I have reviewed the patient's chart and labs.  Questions were answered to the patient's satisfaction.     [name]

## 2016-02-04 NOTE — H&P (View-Only) (Signed)
Patient Name: Kerry Barnes Date of Encounter: 02/03/2016  Hospital Problem List     Active Problems:   Atrial flutter with rapid ventricular response (HCC)   Pain in the chest   HTN (hypertension), benign   Acute kidney injury superimposed on CKD ([place])    Subjective   Eating breakfast, no further reports of chest pain.   Inpatient Medications    . apixaban  5 mg Oral BID  . atorvastatin  40 mg Oral Daily  . clopidogrel  75 mg Oral Daily  . diltiazem  60 mg Oral Q6H  . insulin aspart  0-15 Units Subcutaneous TID WC  . insulin glargine  24 Units Subcutaneous QHS  . linaclotide  145 mcg Oral QAC breakfast  . mometasone-formoterol  2 puff Inhalation BID  . pantoprazole  40 mg Oral Daily  . pregabalin  75 mg Oral BID    Vital Signs    Vitals:   02/02/16 2021 02/03/16 0047 02/03/16 0500 02/03/16 0744  BP: (!) 146/68 (!) 103/49 (!) 109/54 111/61  Pulse: 78 67 66 66  Resp:  16  18  Temp: 97.9 F (36.6 C) 98.2 F (36.8 C) 98 F (36.7 C) 98 F (36.7 C)  TempSrc: Oral Oral Oral Oral  SpO2: 97% 99% 100% 99%  Weight:   150 lb 6.4 oz (68.2 kg)   Height:        Intake/Output Summary (Last 24 hours) at 02/03/16 0832 Last data filed at 02/02/16 2130  Gross per 24 hour  Intake              240 ml  Output                0 ml  Net              240 ml   Filed Weights   02/01/16 0428 02/02/16 0609 02/03/16 0500  Weight: 152 lb 12.8 oz (69.3 kg) 152 lb (68.9 kg) 150 lb 6.4 oz (68.2 kg)    Physical Exam    General: Pleasant, NAD. Neuro: Alert and oriented X 3. Moves all extremities spontaneously. Psych: Normal affect. HEENT:  Normal  Neck: Supple without bruits or JVD. Lungs:  Resp regular and unlabored, CTA. Heart: Irregularly, irregular no s3, s4, or murmurs. Abdomen: Soft, non-tender, non-distended, BS + x 4.  Extremities: No clubbing, cyanosis or edema. DP/PT/Radials 2+ and equal bilaterally.  Labs    CBC  Recent Labs  01/31/16 1206 01/31/16 1216  02/01/16 0625  WBC 8.5  --  8.5  HGB 14.1 16.0* 12.5  HCT 44.4 47.0* 39.8  MCV 84.7  --  85.0  PLT 373  --  [id number]   Basic Metabolic Panel  Recent Labs  01/31/16 1206 01/31/16 1216 02/01/16 0625  NA 139 143 139  K 4.5 4.8 4.2  CL 107 106 106  CO2 23  --  25  GLUCOSE 141* 143* 116*  BUN 15 23* 18  CREATININE 1.53* 1.60* 1.26*  CALCIUM 10.2  --  9.4   Liver Function Tests No results for input(s): AST, ALT, ALKPHOS, BILITOT, PROT, ALBUMIN in the last 72 hours. No results for input(s): LIPASE, AMYLASE in the last 72 hours. Cardiac Enzymes  Recent Labs  01/31/16 1849 02/01/16 0023 02/01/16 0625  TROPONINI <0.03 <0.03 <0.03   BNP Invalid input(s): POCBNP D-Dimer No results for input(s): DDIMER in the last 72 hours. Hemoglobin A1C No results for input(s): HGBA1C in the last 72 hours. Fasting Lipid  Panel No results for input(s): CHOL, HDL, LDLCALC, TRIG, CHOLHDL, LDLDIRECT in the last 72 hours. Thyroid Function Tests  Recent Labs  01/31/16 1849  TSH 1.457    Telemetry    A-Flutter  ECG    None this morning  Radiology    TTE; 02/01/2016  Study Conclusions  - Left ventricle: The cavity size was normal. Wall thickness was   normal. Systolic function was vigorous. The estimated ejection   fraction was in the range of 65% to 70%. Wall motion was normal;   there were no regional wall motion abnormalities. The study is   not technically sufficient to allow evaluation of LV diastolic   function. - Aortic valve: Mildly calcified annulus. Trileaflet. - Mitral valve: There was trivial regurgitation. - Left atrium: The atrium was moderately to severely dilated. - Right atrium: Central venous pressure (est): 3 mm Hg. - Tricuspid valve: There was mild regurgitation. - Pulmonary arteries: PA peak pressure: 38 mm Hg (S). - Pericardium, extracardiac: There was no pericardial effusion.  Impressions:  - Normal LV wall thickness with LVEF 65-70%. Indeterminate    diastolic function in the setting of atrial flutter. Moderate to   severe left atrial enlargement. Trivial mitral regurgitation.   Mildly calcified aortic annulus. Mild tricuspid regurgitation   with PASP 38 mmHg.  Assessment & Plan    82 yo female with PMH of Diastolic heart failure, palpitations related to PACs, moderate pulmonary hypertension, hypertension, mitral regurg, hyperlipidemia, TIA currently on Plavix, and diabetes mellitus type 2 who presented to the [facility] ED with complaints of centralized chest pain, and feeling like her heart was racing. Found to be in new onset a-flutter RVR.   1. A-Flutter with [id number] feeling "off" for the past couple of weeks. Developed centralized chest pain and noted her HR to be in the 140s. This eventually subsided on its own, but returned again themorning of admissionafter she woke up. Took HR again and noted 150 bpm. Presented to the ED and found to be in new onset A-flutter RVR, pale, diaphoretic and hypotensive. Given 10mg  Cardizem bolus, and started on drip. Rate improved, and reported feeling much better. BP stabilized. Trop neg x3. Chest xray negative. This patients CHA2DS2-VASc Score and unadjusted Ischemic Stroke Rate (% per year) is equal to 10.8 % stroke rate/year from a score of 8. Above score calculated as 1 point each if present [ CHF,HTN,DM,Vascular=MI/PAD/Aortic Plaque, Age if 60-74, or Female]. Above score calculated as 2 points each if present [Age >75,or Stroke/TIA/TE]. -- Trop neg x 3 and TSH normal -- Started onEliquis per pharmacy dosinggiven renal function. Renal function normalized now. Wt 69kg and Creatinine <1.5 so will continue at 5mg  BID. -- HR stable in the 90's on PO Cardizem. Change to Cardizem CD 240mg  daily? -- 2D echo shows normal LVF with EF 65-70% with moderate to severely dilated LA and mild pulmonary HTN - PASP 65mmHg.   -- Reports ambulating in the hallway yesterday and feel very dyspneic, along with  elevation in heart rate into the 140s. States she does feel dyspneic with minimal activity. May need to consider inpatient DCCV, given she has been on Eliquis since 01/31/2016.  2. HTN:BP well controlled. Stopped norvasc as we are adding on PO Cardizem. Continue to hold ARB for now due to creatinine 1.26.  3. HLD: continue statin  4. [id number] Plavix, and statin  5. DM:SSI  6. AKI: improved and close to baseline. Suspect due to transient hypotension.   Signed, Reino Bellis  NP-C Pager [phone]  As above, patient seen and examined.She continues with dyspnea on exertion and her heart rate is elevated at times. Plan to continue Cardizem and apixaban. Given that she remains symptomatic I feel we need to reestablish sinus rhythm during this hospitalization. I have discussed the patient with EP. They feel this is atypical flutter and would be more difficult to ablate. They have recommended TEE guided cardioversion which we will arrange for tomorrow. She can then follow-up with Dr. Rayann Heman after discharge for consideration of left-side flutter ablation.  Kirk Ruths

## 2016-02-04 NOTE — Anesthesia Postprocedure Evaluation (Signed)
Anesthesia Post Note  Patient: Kerry Barnes  Procedure(s) Performed: Procedure(s) (LRB): TRANSESOPHAGEAL ECHOCARDIOGRAM (TEE) (N/A) CARDIOVERSION (N/A)  Patient location during evaluation: PACU Anesthesia Type: MAC Level of consciousness: awake and alert Pain management: pain level controlled Vital Signs Assessment: post-procedure vital signs reviewed and stable Respiratory status: spontaneous breathing, nonlabored ventilation, respiratory function stable and patient connected to nasal cannula oxygen Cardiovascular status: stable and blood pressure returned to baseline Anesthetic complications: no    Last Vitals:  Vitals:   02/04/16 0900 02/04/16 0910  BP: 124/62 (!) 130/55  Pulse: 66 62  Resp: 16 13  Temp:      Last Pain:  Vitals:   02/04/16 0740  TempSrc: Oral  PainSc:                  Tiajuana Amass

## 2016-02-04 NOTE — Progress Notes (Signed)
Patient Name: Kerry Barnes Date of Encounter: 02/04/2016  Hospital Problem List     Active Problems:   Atrial flutter with rapid ventricular response (HCC)   Pain in the chest   HTN (hypertension), benign   Acute kidney injury superimposed on CKD ([place])    Subjective   No chest pain or dyspnea  Inpatient Medications    . apixaban  5 mg Oral BID  . atorvastatin  40 mg Oral Daily  . diltiazem  60 mg Oral Q6H  . insulin aspart  0-15 Units Subcutaneous TID WC  . insulin glargine  24 Units Subcutaneous QHS  . linaclotide  145 mcg Oral QAC breakfast  . mometasone-formoterol  2 puff Inhalation BID  . pantoprazole  40 mg Oral Daily  . pregabalin  75 mg Oral BID    Vital Signs    Vitals:   02/04/16 0850 02/04/16 0900 02/04/16 0910 02/04/16 0939  BP: (!) 108/57 124/62 (!) 130/55 (!) 126/99  Pulse: 62 66 62 65  Resp: 16 16 13 15   Temp:    97.9 F (36.6 C)  TempSrc:    Oral  SpO2: 100% 100% 100% 96%  Weight:      Height:        Intake/Output Summary (Last 24 hours) at 02/04/16 1039 Last data filed at 02/04/16 0846  Gross per 24 hour  Intake              960 ml  Output                0 ml  Net              960 ml   Filed Weights   02/02/16 0609 02/03/16 0500 02/04/16 0309  Weight: 152 lb (68.9 kg) 150 lb 6.4 oz (68.2 kg) 153 lb 6.4 oz (69.6 kg)    Physical Exam    General: Pleasant, NAD. Neuro: Alert and oriented X 3. Moves all extremities spontaneously. Psych: Normal affect. HEENT:  Normal  Neck: Supple Lungs:  CTA. Heart: RRR Abdomen: Soft, non-tender, non-distended Extremities: No edema.     Telemetry    Sinus    Radiology    TTE; 02/01/2016  Study Conclusions  - Left ventricle: The cavity size was normal. Wall thickness was   normal. Systolic function was vigorous. The estimated ejection   fraction was in the range of 65% to 70%. Wall motion was normal;   there were no regional wall motion abnormalities. The study is   not  technically sufficient to allow evaluation of LV diastolic   function. - Aortic valve: Mildly calcified annulus. Trileaflet. - Mitral valve: There was trivial regurgitation. - Left atrium: The atrium was moderately to severely dilated. - Right atrium: Central venous pressure (est): 3 mm Hg. - Tricuspid valve: There was mild regurgitation. - Pulmonary arteries: PA peak pressure: 38 mm Hg (S). - Pericardium, extracardiac: There was no pericardial effusion.  Impressions:  - Normal LV wall thickness with LVEF 65-70%. Indeterminate   diastolic function in the setting of atrial flutter. Moderate to   severe left atrial enlargement. Trivial mitral regurgitation.   Mildly calcified aortic annulus. Mild tricuspid regurgitation   with PASP 38 mmHg.  Assessment & Plan    82 yo female with PMH of Diastolic heart failure, palpitations related to PACs, moderate pulmonary hypertension, hypertension, mitral regurg, hyperlipidemia, TIA currently on Plavix, and diabetes mellitus type 2 who presented to the [facility] ED with complaints of centralized  chest pain, and feeling like her heart was racing. Found to be in new onset a-flutter RVR.   1. A-Flutter with [id number] is now status post TEE guided cardioversion which was successful. She is in sinus rhythm at present with improvement in symptoms. We'll change Cardizem to 180 mg CD daily. Continue apixaban. Patient will follow-up with Dr. Rayann Heman after discharge for consideration of left side ablation.  2. HTN:BP well controlled. Continue present medications.  3. HLD: continue statin  4. [id number] statin; no Plavix given addition of apixaban.  5. DM:SSI  Plan discharge tomorrow morning if stable.  Signed, Kirk Ruths, MD

## 2016-02-05 ENCOUNTER — Other Ambulatory Visit: Payer: Self-pay | Admitting: *Deleted

## 2016-02-05 DIAGNOSIS — Z7901 Long term (current) use of anticoagulants: Secondary | ICD-10-CM

## 2016-02-05 LAB — BASIC METABOLIC PANEL
Anion gap: 6 (ref 5–15)
BUN: 28 mg/dL — ABNORMAL HIGH (ref 6–20)
CO2: 24 mmol/L (ref 22–32)
Calcium: 9 mg/dL (ref 8.9–10.3)
Chloride: 108 mmol/L (ref 101–111)
Creatinine, Ser: 1.36 mg/dL — ABNORMAL HIGH (ref 0.44–1.00)
GFR calc Af Amer: 42 mL/min — ABNORMAL LOW (ref >60)
GFR calc non Af Amer: 36 mL/min — ABNORMAL LOW (ref >60)
Glucose, Bld: 150 mg/dL — ABNORMAL HIGH (ref 65–99)
Potassium: 3.7 mmol/L (ref 3.5–5.1)
Sodium: 138 mmol/L (ref 135–145)

## 2016-02-05 LAB — CBC
HCT: 33.9 % — ABNORMAL LOW (ref 36.0–46.0)
Hemoglobin: 10.5 g/dL — ABNORMAL LOW (ref 12.0–15.0)
MCH: 26.2 pg (ref 26.0–34.0)
MCHC: 31 g/dL (ref 30.0–36.0)
MCV: 84.5 fL (ref 78.0–100.0)
Platelets: 266 10*3/uL (ref 150–400)
RBC: 4.01 MIL/uL (ref 3.87–5.11)
RDW: 14.6 % (ref 11.5–15.5)
WBC: 10.1 10*3/uL (ref 4.0–10.5)

## 2016-02-05 LAB — GLUCOSE, CAPILLARY
Glucose-Capillary: 96 mg/dL (ref 65–99)
Glucose-Capillary: 116 mg/dL — ABNORMAL HIGH (ref 65–99)

## 2016-02-05 MED ORDER — APIXABAN 5 MG PO TABS: 5.0000 mg | ORAL_TABLET | Freq: Two times a day (BID) | ORAL | 3 refills | Status: DC

## 2016-02-05 MED ORDER — ACETAMINOPHEN 325 MG PO TABS: 650.0000 mg | ORAL_TABLET | ORAL | Status: DC | PRN

## 2016-02-05 MED ORDER — PANTOPRAZOLE SODIUM 40 MG PO TBEC: 40.0000 mg | DELAYED_RELEASE_TABLET | Freq: Every day | ORAL | 11 refills | Status: DC

## 2016-02-05 MED ORDER — DILTIAZEM HCL ER COATED BEADS 180 MG PO CP24: 180.0000 mg | ORAL_CAPSULE | Freq: Every day | ORAL | 3 refills | Status: DC

## 2016-02-05 NOTE — Discharge Instructions (Signed)
Apixaban oral tablets °What is this medicine? °APIXABAN (a PIX a ban) is an anticoagulant (blood thinner). It is used to lower the chance of stroke in people with a medical condition called atrial fibrillation. It is also used to treat or prevent blood clots in the lungs or in the veins. °This medicine may be used for other purposes; ask your health care provider or pharmacist if you have questions. °What should I tell my health care provider before I take this medicine? °They need to know if you have any of these conditions: °-bleeding disorders °-bleeding in the brain °-blood in your stools (black or tarry stools) or if you have blood in your vomit °-history of stomach bleeding °-kidney disease °-liver disease °-mechanical heart valve °-an unusual or allergic reaction to apixaban, other medicines, foods, dyes, or preservatives °-pregnant or trying to get pregnant °-breast-feeding °How should I use this medicine? °Take this medicine by mouth with a glass of water. Follow the directions on the prescription label. You can take it with or without food. If it upsets your stomach, take it with food. Take your medicine at regular intervals. Do not take it more often than directed. Do not stop taking except on your doctor's advice. Stopping this medicine may increase your risk of a blot clot. Be sure to refill your prescription before you run out of medicine. °Talk to your pediatrician regarding the use of this medicine in children. Special care may be needed. °Overdosage: If you think you have taken too much of this medicine contact a poison control center or emergency room at once. °NOTE: This medicine is only for you. Do not share this medicine with others. °What if I miss a dose? °If you miss a dose, take it as soon as you can. If it is almost time for your next dose, take only that dose. Do not take double or extra doses. °What may interact with this medicine? °This medicine may interact with the following: °-aspirin  and aspirin-like medicines °-certain medicines for fungal infections like ketoconazole and itraconazole °-certain medicines for seizures like carbamazepine and phenytoin °-certain medicines that treat or prevent blood clots like warfarin, enoxaparin, and dalteparin °-clarithromycin °-NSAIDs, medicines for pain and inflammation, like ibuprofen or naproxen °-rifampin °-ritonavir °-St. John's wort °This list may not describe all possible interactions. Give your health care provider a list of all the medicines, herbs, non-prescription drugs, or dietary supplements you use. Also tell them if you smoke, drink alcohol, or use illegal drugs. Some items may interact with your medicine. °What should I watch for while using this medicine? °Notify your doctor or health care professional and seek emergency treatment if you develop breathing problems; changes in vision; chest pain; severe, sudden headache; pain, swelling, warmth in the leg; trouble speaking; sudden numbness or weakness of the face, arm, or leg. These can be signs that your condition has gotten worse. °If you are going to have surgery, tell your doctor or health care professional that you are taking this medicine. °Tell your health care professional that you use this medicine before you have a spinal or epidural procedure. Sometimes people who take this medicine have bleeding problems around the spine when they have a spinal or epidural procedure. This bleeding is very rare. If you have a spinal or epidural procedure while on this medicine, call your health care professional immediately if you have back pain, numbness or tingling (especially in your legs and feet), muscle weakness, paralysis, or loss of bladder or bowel   control. Avoid sports and activities that might cause injury while you are using this medicine. Severe falls or injuries can cause unseen bleeding. Be careful when using sharp tools or knives. Consider using an Copy. Take special care  brushing or flossing your teeth. Report any injuries, bruising, or red spots on the skin to your doctor or health care professional. What side effects may I notice from receiving this medicine? Side effects that you should report to your doctor or health care professional as soon as possible: -allergic reactions like skin rash, itching or hives, swelling of the face, lips, or tongue -signs and symptoms of bleeding such as bloody or black, tarry stools; red or dark-brown urine; spitting up blood or brown material that looks like coffee grounds; red spots on the skin; unusual bruising or bleeding from the eye, gums, or nose This list may not describe all possible side effects. Call your doctor for medical advice about side effects. You may report side effects to FDA at 1-800-FDA-1088. Where should I keep my medicine? Keep out of the reach of children. Store at room temperature between 20 and 25 degrees C (68 and 77 degrees F). Throw away any unused medicine after the expiration date. NOTE: This sheet is a summary. It may not cover all possible information. If you have questions about this medicine, talk to your doctor, pharmacist, or health care provider.    2016, Elsevier/Gold Standard. (2013-02-03 11:59:24) Atrial Fibrillation Atrial fibrillation is a type of heartbeat that is irregular or fast (rapid). If you have this condition, your heart keeps quivering in a weird (chaotic) way. This condition can make it so your heart cannot pump blood normally. Having this condition gives a person more risk for stroke, heart failure, and other heart problems. There are different types of atrial fibrillation. Talk with your doctor to learn about the type that you have. HOME CARE  Take over-the-counter and prescription medicines only as told by your doctor.  If your doctor prescribed a blood-thinning medicine, take it exactly as told. Taking too much of it can cause bleeding. If you do not take enough of it,  you will not have the protection that you need against stroke and other problems.  Do not use any tobacco products. These include cigarettes, chewing tobacco, and e-cigarettes. If you need help quitting, ask your doctor.  If you have apnea (obstructive sleep apnea), manage it as told by your doctor.  Do not drink alcohol.  Do not drink beverages that have caffeine. These include coffee, soda, and tea.  Maintain a healthy weight. Do not use diet pills unless your doctor says they are safe for you. Diet pills may make heart problems worse.  Follow diet instructions as told by your doctor.  Exercise regularly as told by your doctor.  Keep all follow-up visits as told by your doctor. This is important. GET HELP IF:  You notice a change in the speed, rhythm, or strength of your heartbeat.  You are taking a blood-thinning medicine and you notice more bruising.  You get tired more easily when you move or exercise. GET HELP RIGHT AWAY IF:  You have pain in your chest or your belly (abdomen).  You have sweating or weakness.  You feel sick to your stomach (nauseous).  You notice blood in your throw up (vomit), poop (stool), or pee (urine).  You are short of breath.  You suddenly have swollen feet and ankles.  You feel dizzy.  Your suddenly get weak  or numb in your face, arms, or legs, especially if it happens on one side of your body.  You have trouble talking, trouble understanding, or both.  Your face or your eyelid droops on one side. These symptoms may be an emergency. Do not wait to see if the symptoms will go away. Get medical help right away. Call your local emergency services (911 in the [place] Do not drive yourself to the hospital.   This information is not intended to replace advice given to you by your health care provider. Make sure you discuss any questions you have with your health care provider.   Document Released: 03/10/2008 Document Revised: 02/20/2015  Document Reviewed: 09/26/2014 Elsevier Interactive Patient Education [employer].

## 2016-02-05 NOTE — Progress Notes (Signed)
Pt resting comfortably, denies any discomfort this shift, breathing even and unlabored, will continue to monitor pt

## 2016-02-05 NOTE — Progress Notes (Signed)
Patient Name: Kerry Barnes Date of Encounter: 02/05/2016  Hospital Problem List     Active Problems:   Atrial flutter with rapid ventricular response (HCC)   Pain in the chest   HTN (hypertension), benign   Acute kidney injury superimposed on CKD ([place])    Subjective   No chest pain or dyspnea  Inpatient Medications    . apixaban  5 mg Oral BID  . atorvastatin  40 mg Oral Daily  . diltiazem  180 mg Oral Daily  . insulin aspart  0-15 Units Subcutaneous TID WC  . insulin glargine  24 Units Subcutaneous QHS  . linaclotide  145 mcg Oral QAC breakfast  . mometasone-formoterol  2 puff Inhalation BID  . pantoprazole  40 mg Oral Daily  . pregabalin  75 mg Oral BID    Vital Signs    Vitals:   02/04/16 1359 02/04/16 1942 02/04/16 2022 02/05/16 0545  BP:  (!) 112/54  (!) 105/56  Pulse:  65  (!) 56  Resp:  (!) 21  20  Temp: 98.1 F (36.7 C) 98.5 F (36.9 C)  98.3 F (36.8 C)  TempSrc: Oral Oral  Oral  SpO2: 99% 100% 100% 99%  Weight:    153 lb 12.8 oz (69.8 kg)  Height:        Intake/Output Summary (Last 24 hours) at 02/05/16 0921 Last data filed at 02/05/16 0850  Gross per 24 hour  Intake              760 ml  Output                0 ml  Net              760 ml   Filed Weights   02/03/16 0500 02/04/16 0309 02/05/16 0545  Weight: 150 lb 6.4 oz (68.2 kg) 153 lb 6.4 oz (69.6 kg) 153 lb 12.8 oz (69.8 kg)    Physical Exam    General: Pleasant, NAD. Neuro: Alert and oriented X 3. Moves all extremities spontaneously. Psych: Normal affect. HEENT:  Normal  Neck: Supple Lungs:  CTA. Heart: RRR Abdomen: Soft, non-tender, non-distended Extremities: No edema.     Telemetry    Sinus    Radiology    TTE; 02/01/2016  Study Conclusions  - Left ventricle: The cavity size was normal. Wall thickness was   normal. Systolic function was vigorous. The estimated ejection   fraction was in the range of 65% to 70%. Wall motion was normal;   there were no  regional wall motion abnormalities. The study is   not technically sufficient to allow evaluation of LV diastolic   function. - Aortic valve: Mildly calcified annulus. Trileaflet. - Mitral valve: There was trivial regurgitation. - Left atrium: The atrium was moderately to severely dilated. - Right atrium: Central venous pressure (est): 3 mm Hg. - Tricuspid valve: There was mild regurgitation. - Pulmonary arteries: PA peak pressure: 38 mm Hg (S). - Pericardium, extracardiac: There was no pericardial effusion.  Impressions:  - Normal LV wall thickness with LVEF 65-70%. Indeterminate   diastolic function in the setting of atrial flutter. Moderate to   severe left atrial enlargement. Trivial mitral regurgitation.   Mildly calcified aortic annulus. Mild tricuspid regurgitation   with PASP 38 mmHg.  Assessment & Plan    82 yo female with PMH of Diastolic heart failure, palpitations related to PACs, moderate pulmonary hypertension, hypertension, mitral regurg, hyperlipidemia, TIA currently on Plavix, and diabetes  mellitus type 2 who presented to the [facility] ED with complaints of centralized chest pain, and feeling like her heart was racing. Found to be in new onset a-flutter RVR.   1. A-Flutter with [id number] is now status post TEE guided cardioversion which was successful. She remains in sinus rhythm. Continue cardizem 180 mg CD daily. Continue apixaban. Patient will follow-up with Dr. Rayann Heman after discharge for consideration of left side ablation.  2. HTN:BP well controlled. Continue present medications.  3. HLD: continue statin  4. [id number] statin; no Plavix given addition of apixaban.  5. DM:SSI  Plan discharge tomorrow today and fu with Dr Rayann Heman and Dr Fletcher Anon >30 min PA and physician time D2  Signed, Kirk Ruths, MD

## 2016-02-05 NOTE — Consult Note (Signed)
   [facility] CM Inpatient Consult   02/05/2016  Kerry Barnes 1934-05-31 [id number]   Patient screened for [place] Management program on behalf of her [employer] insurance.  Went to bedside to discuss and offer [place] Management services. Kerry Barnes is agreeable and written consent signed. Explained to patient that she will receive post hospital transition of care calls and will be evaluated for monthly home visits. Confirmed best contact number as [phone]. She lives with husband. Denies having issues with obtaining medications or with transportation. However, she will be starting new medication Eliquis and can benefit from follow up post discharge.  Left [facility] Care Management packet and contact information at bedside. Will make inpatient RNCM aware that patient will be followed by [place] Management post hospital discharge.  Will request for Kerry Barnes to be assigned to [facility] for transition of care. She will newly be starting Eliquis. She has history of Aflutter/Afib, DM, HTN, TIA. She is for discharge today.   Marthenia Rolling, MSN-Ed, RN,BSN [facility] Liaison [phone]

## 2016-02-05 NOTE — Discharge Summary (Signed)
Patient ID: Kerry Barnes,  MRN: [id number], DOB/AGE: [birth year] 82 y.o.  Admit date: 01/31/2016 Discharge date: 02/05/2016  Primary Care Provider: Tula Nakayama Primary Cardiologist: Dr Fletcher Anon, Dr Allred  Discharge Diagnoses Principal Problem:   Chest pain Active Problems:   Atrial flutter with rapid ventricular response (HCC)   Type 2 diabetes with stage 3 chronic kidney disease GFR 30-59 (HCC)   Essential hypertension   Chronic diastolic heart failure (HCC)   Acute kidney injury superimposed on CKD ([place])   Chronic anticoagulation   Hyperlipidemia    Procedures: TEE CV 02/04/16   Hospital Course:  82 yo female with PMH of diastolic heart failure, palpitations related to PACs, moderate pulmonary hypertension, hypertension, mitral regurg, hyperlipidemia, TIA- currently on Plavix, and diabetes mellitus type 2 who presented to the [facility] ED 01/31/16 with complaints of centralized chest pain, and feeling like her heart was racing. She was found to be in new onset a-flutter RVR with RVR. She had chest pain, inferior ST depression, and hypotension with this. Her chest pain and EKG changes improved with rate control. She is a CHADs VASc=8 and Eliquis was started. Though her rate did improve she still had fatigue and DOE and it was decided to proceed with TEE CV before discharge. This was done 02/04/16 and was successful.   Discharge Vitals:  Blood pressure (!) 105/56, pulse (!) 56, temperature 98.3 F (36.8 C), temperature source Oral, resp. rate 20, height 5\' 1"  (1.549 m), weight 153 lb 12.8 oz (69.8 kg), SpO2 99 %.    Labs: Results for orders placed or performed during the hospital encounter of 01/31/16 (from the past 24 hour(s))  Glucose, capillary     Status: Abnormal   Collection Time: 02/04/16  4:25 PM  Result Value Ref Range   Glucose-Capillary 120 (H) 65 - 99 mg/dL  Glucose, capillary     Status: Abnormal   Collection Time: 02/04/16  8:27 PM  Result Value  Ref Range   Glucose-Capillary 135 (H) 65 - 99 mg/dL  Basic metabolic panel     Status: Abnormal   Collection Time: 02/05/16  3:55 AM  Result Value Ref Range   Sodium 138 135 - 145 mmol/L   Potassium 3.7 3.5 - 5.1 mmol/L   Chloride 108 101 - 111 mmol/L   CO2 24 22 - 32 mmol/L   Glucose, Bld 150 (H) 65 - 99 mg/dL   BUN 28 (H) 6 - 20 mg/dL   Creatinine, Ser 1.36 (H) 0.44 - 1.00 mg/dL   Calcium 9.0 8.9 - 10.3 mg/dL   GFR calc non Af Amer 36 (L) >60 mL/min   GFR calc Af Amer 42 (L) >60 mL/min   Anion gap 6 5 - 15  CBC     Status: Abnormal   Collection Time: 02/05/16  3:55 AM  Result Value Ref Range   WBC 10.1 4.0 - 10.5 K/uL   RBC 4.01 3.87 - 5.11 MIL/uL   Hemoglobin 10.5 (L) 12.0 - 15.0 g/dL   HCT 33.9 (L) 36.0 - 46.0 %   MCV 84.5 78.0 - 100.0 fL   MCH 26.2 26.0 - 34.0 pg   MCHC 31.0 30.0 - 36.0 g/dL   RDW 14.6 11.5 - 15.5 %   Platelets 266 150 - 400 K/uL  Glucose, capillary     Status: None   Collection Time: 02/05/16  7:29 AM  Result Value Ref Range   Glucose-Capillary 96 65 - 99 mg/dL  Glucose, capillary  Status: Abnormal   Collection Time: 02/05/16 11:09 AM  Result Value Ref Range   Glucose-Capillary 116 (H) 65 - 99 mg/dL    Disposition:  Follow-up Information    Thompson Grayer, MD Follow up on 02/24/2016.   Specialty:  Cardiology Why:  at 3:15PM  Contact information: [address] [place] [address] [phone]           Discharge Medications:    Medication List    STOP taking these medications   clopidogrel 75 MG tablet Commonly known as:  PLAVIX   olmesartan-hydrochlorothiazide 40-25 MG tablet Commonly known as:  BENICAR HCT     TAKE these medications   acetaminophen 325 MG tablet Commonly known as:  TYLENOL Take 2 tablets (650 mg total) by mouth every 4 (four) hours as needed for headache or mild pain.   albuterol 108 (90 Base) MCG/ACT inhaler Commonly known as:  PROVENTIL HFA;VENTOLIN HFA Inhale into the lungs every 6 (six)  hours as needed for wheezing or shortness of breath.   ALPRAZolam 0.5 MG tablet Commonly known as:  XANAX Take 0.5 mg by mouth 2 (two) times daily as needed for anxiety.   apixaban 5 MG Tabs tablet Commonly known as:  ELIQUIS Take 1 tablet (5 mg total) by mouth 2 (two) times daily.   atorvastatin 40 MG tablet Commonly known as:  LIPITOR Take 40 mg by mouth daily. What changed:  Another medication with the same name was removed. Continue taking this medication, and follow the directions you see here.   cetirizine 10 MG tablet Commonly known as:  ZYRTEC Take 10 mg by mouth daily.   clobetasol ointment 0.05 % Commonly known as:  TEMOVATE Apply 1 application topically 2 (two) times daily.   diltiazem 180 MG 24 hr capsule Commonly known as:  CARDIZEM CD Take 1 capsule (180 mg total) by mouth daily.   EPINEPHrine 0.3 mg/0.3 mL Soaj injection Commonly known as:  EPI-PEN Inject into the muscle once.   FLUOCINOLONE ACETONIDE BODY 0.01 % Oil Apply 1 application topically daily.   Fluticasone-Salmeterol 100-50 MCG/DOSE Aepb Commonly known as:  ADVAIR Inhale 1 puff into the lungs every 12 (twelve) hours.   insulin glargine 100 UNIT/ML injection Commonly known as:  LANTUS Inject 24 Units into the skin at bedtime.   LINZESS 145 MCG Caps capsule Generic drug:  linaclotide TAKE 1 CAPSULE(145 MCG) BY MOUTH DAILY   meclizine 25 MG tablet Commonly known as:  ANTIVERT Take 25 mg by mouth 3 (three) times daily as needed for dizziness.   mometasone 50 MCG/ACT nasal spray Commonly known as:  NASONEX Place 2 sprays into the nose daily.   olopatadine 0.1 % ophthalmic solution Commonly known as:  PATANOL Place 1 drop into both eyes 2 (two) times daily.   pantoprazole 40 MG tablet Commonly known as:  PROTONIX Take 1 tablet (40 mg total) by mouth daily.   pregabalin 75 MG capsule Commonly known as:  LYRICA Take 75 mg by mouth 2 (two) times daily.   SYSTANE BALANCE OP Apply 1  drop to eye daily as needed (dryness).   traMADol 50 MG tablet Commonly known as:  ULTRAM Take 50 mg by mouth at bedtime as needed for severe pain.        Duration of Discharge Encounter: Greater than 30 minutes including physician time.  Angelena Form PA-C 02/05/2016 11:41 AM

## 2016-02-06 ENCOUNTER — Other Ambulatory Visit: Payer: Self-pay | Admitting: *Deleted

## 2016-02-06 ENCOUNTER — Other Ambulatory Visit: Payer: Self-pay | Admitting: Internal Medicine

## 2016-02-06 NOTE — Patient Outreach (Signed)
[facility] [facility]) Care Management  02/06/2016  CADIA COBO 1934/01/26 [id number]   Referral received from hospital liaison to initiate transition of care program once member discharged.  Member admitted to hospital on 8/18 for congestive heart failure, discharged 8/23.  According to chart, he also has history of hypertension, mitral regurgitation, congestive heart failure, diabetes, and kidney disease.  Call placed to initiate transition of care program, no answer.  HIPAA compliant voice message left, will await call back.  If no call back, will make 2nd attempt tomorrow.  [name], [place], MSN [place] [phone]

## 2016-02-07 ENCOUNTER — Encounter: Payer: Self-pay | Admitting: *Deleted

## 2016-02-07 ENCOUNTER — Other Ambulatory Visit: Payer: Self-pay | Admitting: *Deleted

## 2016-02-07 NOTE — Patient Outreach (Signed)
[facility]) Care Management  02/07/2016  SHAYANA COLO 03/16/1934 [id number]   2nd attempt made to contact member, this time successful.  This care manager introduced self and purpose of call.  [facility] care management services explained, member agrees to involvement.  She state that she has been doing well with the exception of a small nose bleed yesterday.  She report that she was told that this may happen, and state that it was just a "little trickle" that was resolved without intervention.  Advised to monitor for bleeding and to contact her cardiologist with any concerns.  She denies any pain or discomfort at this time.  Patient was recently discharged from hospital and all medications have been reviewed.  She state that she is taking them as prescribed, denies any concern with management or finances.  She report she has follow up appointment with cardiologist and PCP next month, unable to get appointment within the next 5 days.  She received the first available appointment.  She does agree to home visit with this care manager next week.    She denies any other concerns at this time, advised to contact with questions.  Provided with contact information for this care manager.  [name], [place], MSN [place] [phone]

## 2016-02-13 ENCOUNTER — Other Ambulatory Visit: Payer: Self-pay | Admitting: *Deleted

## 2016-02-13 ENCOUNTER — Encounter: Payer: Self-pay | Admitting: *Deleted

## 2016-02-13 NOTE — Patient Outreach (Signed)
[facility]) Care Management   02/13/2016  Kerry Barnes August 29, 1933 [id number]  Kerry Barnes is an 82 y.o. female  Subjective:   Member states that she is progressing well since her discharge.  She has adjusted back to her pre-hospital diet, diabetic/low salt.  She reports that she has been taking her medications as prescribed, and has continued to check her blood pressure, heart rate, and blood sugar, reporting that they have all been stable and below her stated target.  She is aware of signs/symptoms of A-fib, denies any concerns at this time.    Objective:   Review of Systems  Constitutional: Negative.   HENT: Negative.   Eyes: Negative.   Respiratory: Negative.   Cardiovascular: Negative.   Gastrointestinal: Negative.   Genitourinary: Negative.   Musculoskeletal: Negative.   Skin: Negative.   Neurological: Negative.   Endo/Heme/Allergies: Negative.   Psychiatric/Behavioral: Negative.     Physical Exam  Constitutional: She is oriented to person, place, and time. She appears well-developed and well-nourished.  Neck: Normal range of motion.  Cardiovascular: Normal rate, regular rhythm and normal heart sounds.   Respiratory: Effort normal and breath sounds normal.  GI: Soft. Bowel sounds are normal.  Musculoskeletal: Normal range of motion.  Neurological: She is alert and oriented to person, place, and time.  Skin: Skin is warm and dry.    BP 134/72 (BP Location: Left Arm, Patient Position: Sitting, Cuff Size: Normal)   Pulse 71   Resp 18   Ht 1.549 m (_0 )   Wt 148 lb (67.1 kg)   SpO2 98%   BMI 27.96 kg/m    Encounter Medications:   Outpatient Encounter Prescriptions as of 02/13/2016  Medication Sig Note  . acetaminophen (TYLENOL) 325 MG tablet Take 2 tablets (650 mg total) by mouth every 4 (four) hours as needed for headache or mild pain.   Marland Kitchen albuterol (PROVENTIL HFA;VENTOLIN HFA) 108 (90 BASE) MCG/ACT inhaler Inhale into the lungs  every 6 (six) hours as needed for wheezing or shortness of breath.   . ALPRAZolam (XANAX) 0.5 MG tablet Take 0.5 mg by mouth 2 (two) times daily as needed for anxiety.   Marland Kitchen apixaban (ELIQUIS) 5 MG TABS tablet Take 1 tablet (5 mg total) by mouth 2 (two) times daily.   Marland Kitchen atorvastatin (LIPITOR) 40 MG tablet Take 40 mg by mouth daily.   . cetirizine (ZYRTEC) 10 MG tablet Take 10 mg by mouth daily.   . clobetasol ointment (TEMOVATE) 8.25 % Apply 1 application topically 2 (two) times daily.   Marland Kitchen diltiazem (CARDIZEM CD) 180 MG 24 hr capsule Take 1 capsule (180 mg total) by mouth daily.   Marland Kitchen EPINEPHrine (EPI-PEN) 0.3 mg/0.3 mL SOAJ injection Inject into the muscle once.   Marland Kitchen FLUOCINOLONE ACETONIDE BODY 0.01 % OIL Apply 1 application topically daily. 11/05/2015: Received from: External Pharmacy Received Sig:   . Fluticasone-Salmeterol (ADVAIR) 100-50 MCG/DOSE AEPB Inhale 1 puff into the lungs every 12 (twelve) hours.   . insulin glargine (LANTUS) 100 UNIT/ML injection Inject 24 Units into the skin at bedtime.    Marland Kitchen LINZESS 145 MCG CAPS capsule TAKE 1 CAPSULE(145 MCG) BY MOUTH DAILY   . meclizine (ANTIVERT) 25 MG tablet Take 25 mg by mouth 3 (three) times daily as needed for dizziness.    . mometasone (NASONEX) 50 MCG/ACT nasal spray Place 2 sprays into the nose daily.   Marland Kitchen olopatadine (PATANOL) 0.1 % ophthalmic solution Place 1 drop into both eyes 2 (two)  times daily.    . pantoprazole (PROTONIX) 40 MG tablet Take 1 tablet (40 mg total) by mouth daily.   . pregabalin (LYRICA) 75 MG capsule Take 75 mg by mouth 2 (two) times daily.   Marland Kitchen Propylene Glycol (SYSTANE BALANCE OP) Apply 1 drop to eye daily as needed (dryness).   . traMADol (ULTRAM) 50 MG tablet Take 50 mg by mouth at bedtime as needed for severe pain.  11/05/2015: Received from: External Pharmacy Received Sig:    No facility-administered encounter medications on file as of 02/13/2016.     Functional Status:   In your present state of health, do you have  any difficulty performing the following activities: 02/13/2016 01/31/2016  Hearing? N N  Vision? N N  Difficulty concentrating or making decisions? N N  Walking or climbing stairs? N N  Dressing or bathing? N N  Doing errands, shopping? N N  Preparing Food and eating ? N -  Using the Toilet? N -  In the past six months, have you accidently leaked urine? N -  Do you have problems with loss of bowel control? N -  Managing your Medications? N -  Managing your Finances? N -  Housekeeping or managing your Housekeeping? N -  Some recent data might be hidden    Fall/Depression Screening:    PHQ 2/9 Scores 02/13/2016  PHQ - 2 Score 0   Fall Risk  02/13/2016  Falls in the past year? No    Assessment:    Met with member at scheduled time, [facility] care management services again explained.  She again confirms her follow up appointments for the next several weeks, confirms that transportation is not a concern.    Patient was recently discharged from hospital and all medications have been reviewed.  Member taking several medications, agrees to pharmacy referral.  She denies the need for medication assistance, financially or with knowledge, stating that her local pharmacist is very involved with her care.    Member denies any questions at this time.  She denies the need for any assistance in the home.  She has 8 children and a husband that are all involved in her care when needed.  She is independent in all ADLs , and is active in her community and at the gym.  Contact information provided, advised to contact with concerns.  Plan:   Pharmacy referral placed, greater than 15 medications. Will follow up next week with transition of care call.  [facility] CM Care Plan Problem One   Flowsheet Row Most Recent Value  Care Plan Problem One  Risk for readmission related to new onset A-fib as evidenced by recent hospitalization  Role Documenting the Problem One  Care Management [place] for Problem  One  Active  [facility]N Long Term Goal (31-90 days)  Member will not be readmitted to hospital within the next 31 days  [facility]N Long Term Goal Start Date  02/07/16  Interventions for Problem One Long Term Goal  Discussed with member the importance of following discharge instructions, including follow up appointments, medications, diet to decrease the risk of readmission  [facility] CM Short Term Goal #1 (0-30 days)  Member will attend follow up appointment with both cardiologist and PCP within the next 4 weeks  [facility] CM Short Term Goal #1 Start Date  02/07/16  Interventions for Short Term Goal #1  Appointment for cardiology and PCP confirmed through schedule and with member, advised member of the importance of follow up within  5 days for A-fib patients, appointment scheduled for next month  [facility] CM Short Term Goal #2 (0-30 days)  Member will be able to verbalize signs/symptoms of Atrial fibrillation and complications over the next 4 weeks  [facility] CM Short Term Goal #2 Start Date  02/07/16  Interventions for Short Term Goal #2  Educated using teachback method on the signs/symptoms of a-fib, discussed with member the complications/side-effects of newly prescribed medication, Eliquis.  Confirmed with member that she will attend appoinment with A-fib clinic and receive more informaiton on A-fib.     [name], [place], MSN [place] [phone]

## 2016-02-19 ENCOUNTER — Other Ambulatory Visit: Payer: Self-pay | Admitting: *Deleted

## 2016-02-19 NOTE — Patient Outreach (Signed)
[facility]) Care Management  02/19/2016  Kerry Barnes 11-06-1933 [id number]   Weekly transition of care call placed to member.  She state that she is "doing pretty good."  She voices no concern regarding her heart rate, blood pressure, or bleeding.  She reports taking her blood pressure and heart rate daily, stating her blood pressure ranging 130s/60s.  She state her heart rate has been 50s-60s, with a low of 46 over the past week.  She denies elevated heart rate, but also advised to monitor for bradycardia.  She has an appointment with her PCP tomorrow and will report her readings.  She again confirms that she has an appointment with the A-fib clinic on Monday.  She denies any questions with her medications.    She state that she has not restarted her exercise regime at the [facility] due to still feeling "a little shaky."  She report she is being cautious, in effort to decrease risk of injury due to Eliquis.  She again verbalizes understanding of side effects of medication, denies questions.  Will continue with weekly transition of care calls next week.  [name], [place], MSN [place] [phone]

## 2016-02-20 DIAGNOSIS — Z794 Long term (current) use of insulin: Secondary | ICD-10-CM | POA: Diagnosis not present

## 2016-02-20 DIAGNOSIS — D649 Anemia, unspecified: Secondary | ICD-10-CM | POA: Diagnosis not present

## 2016-02-20 DIAGNOSIS — E118 Type 2 diabetes mellitus with unspecified complications: Secondary | ICD-10-CM | POA: Diagnosis not present

## 2016-02-24 ENCOUNTER — Ambulatory Visit (INDEPENDENT_AMBULATORY_CARE_PROVIDER_SITE_OTHER): Payer: PPO | Admitting: Internal Medicine

## 2016-02-24 ENCOUNTER — Encounter: Payer: Self-pay | Admitting: Internal Medicine

## 2016-02-24 VITALS — BP 120/58 | HR 74 | Ht 61.5 in | Wt 155.0 lb

## 2016-02-24 DIAGNOSIS — I484 Atypical atrial flutter: Secondary | ICD-10-CM | POA: Diagnosis not present

## 2016-02-24 NOTE — Patient Instructions (Signed)
Medication Instructions:  Your physician recommends that you continue on your current medications as directed. Please refer to the Current Medication list given to you today.  Labwork: None ordered.  Testing/Procedures: None ordered.  Follow-Up: Your physician recommends that you schedule a follow-up appointment as needed.   Any Other Special Instructions Will Be Listed Below (If Applicable).     If you need a refill on your cardiac medications before your next appointment, please call your pharmacy.   

## 2016-02-24 NOTE — Progress Notes (Signed)
Electrophysiology Office Note   Date:  02/24/2016   ID:  Kerry Barnes, DOB 1934-05-03, MRN [id number]  PCP:  Tula Nakayama  Cardiologist:  Dr Fletcher Anon  CC: atrial flutter   History of Present Illness: Kerry Barnes is a 82 y.o. female who presents today for electrophysiology evaluation.   She was recently hospitalized with atrial flutter.  There were ekgs reviewed by Dr Lovena Le and Dr Stanford Breed during her hospitalization which suggested possibility of an atypical atrial flutter circuit.  She therefore did not receive ablation but was cardioverted to sinus.  She remains in sinus rhythm.  She is appropriately anticoagulated with eliquis.  She is unaware of any additional arrhythmias. Today, she is pan ROS positive.  She has leg swelling, hearing and vision disturbance, cough, abdominal pain, blodd in stool, diarrhea, N/V, blood in urine, muscle pain, rash, dizziness, easy bruising, excessive sweating, excessive fatigue, recent fever, leg pain, irregular heart beat, facial swelling, wheezing, snoring, anxiety, joint swelling, balance/walking problems, and headaches.  Her husband seems to think that she is at her baseline. She thinks that she has been more sleepy since starting diltiazem though she is also on xanax, ultram, and lyrica.  She snores but has not had a sleep study.   Past Medical History:  Diagnosis Date  . Allergy   . Anxiety   . Arthritis   . Asthma   . Blood transfusion without reported diagnosis   . Cataract   . Diabetes mellitus   . Fibromyalgia   . GERD (gastroesophageal reflux disease)   . Hyperlipemia   . Hypertension   . IBS (irritable bowel syndrome)   . Stroke [facility]) 2013   TIA  . Tubular adenoma of colon    Past Surgical History:  Procedure Laterality Date  . ANKLE FRACTURE SURGERY    . CARDIOVERSION N/A 02/04/2016   Procedure: CARDIOVERSION;  Surgeon: Sueanne Margarita, MD;  Location: [facility] ENDOSCOPY;  Service: Cardiovascular;  Laterality: N/A;    . CHOLECYSTECTOMY    . TEE WITHOUT CARDIOVERSION N/A 02/04/2016   Procedure: TRANSESOPHAGEAL ECHOCARDIOGRAM (TEE);  Surgeon: Sueanne Margarita, MD;  Location: [facility] ENDOSCOPY;  Service: Cardiovascular;  Laterality: N/A;  . TUBAL LIGATION    . WRIST FRACTURE SURGERY       Current Outpatient Prescriptions  Medication Sig Dispense Refill  . acetaminophen (TYLENOL) 325 MG tablet Take 2 tablets (650 mg total) by mouth every 4 (four) hours as needed for headache or mild pain.    Marland Kitchen albuterol (PROVENTIL HFA;VENTOLIN HFA) 108 (90 BASE) MCG/ACT inhaler Inhale into the lungs every 6 (six) hours as needed for wheezing or shortness of breath.    . ALPRAZolam (XANAX) 0.5 MG tablet Take 0.5 mg by mouth 2 (two) times daily as needed for anxiety.    Marland Kitchen apixaban (ELIQUIS) 5 MG TABS tablet Take 1 tablet (5 mg total) by mouth 2 (two) times daily. 180 tablet 3  . atorvastatin (LIPITOR) 40 MG tablet Take 40 mg by mouth daily.    . cetirizine (ZYRTEC) 10 MG tablet Take 10 mg by mouth daily.    . clobetasol ointment (TEMOVATE) [id number] % Apply 1 application topically 2 (two) times daily.    Marland Kitchen diltiazem (CARDIZEM CD) 180 MG 24 hr capsule Take 1 capsule (180 mg total) by mouth daily. 90 capsule 3  . EPINEPHrine (EPI-PEN) 0.3 mg/0.3 mL SOAJ injection Inject into the muscle once.    Marland Kitchen FLUOCINOLONE ACETONIDE BODY 0.01 % OIL Apply 1 application topically daily.    Marland Kitchen  Fluticasone-Salmeterol (ADVAIR) 100-50 MCG/DOSE AEPB Inhale 1 puff into the lungs every 12 (twelve) hours.    . insulin glargine (LANTUS) 100 UNIT/ML injection Inject 24 Units into the skin at bedtime.     Marland Kitchen LINZESS 145 MCG CAPS capsule TAKE 1 CAPSULE(145 MCG) BY MOUTH DAILY 30 capsule 3  . meclizine (ANTIVERT) 25 MG tablet Take 25 mg by mouth 3 (three) times daily as needed for dizziness.     . mometasone (NASONEX) 50 MCG/ACT nasal spray Place 2 sprays into the nose daily.    Marland Kitchen olopatadine (PATANOL) 0.1 % ophthalmic solution Place 1 drop into both eyes 2 (two) times  daily.     . pantoprazole (PROTONIX) 40 MG tablet Take 1 tablet (40 mg total) by mouth daily. 30 tablet 11  . pregabalin (LYRICA) 75 MG capsule Take 75 mg by mouth 2 (two) times daily.    Marland Kitchen Propylene Glycol (SYSTANE BALANCE OP) Apply 1 drop to eye daily as needed (dryness).    . traMADol (ULTRAM) 50 MG tablet Take 50 mg by mouth at bedtime as needed for severe pain.      No current facility-administered medications for this visit.     Allergies:   Codeine; Metformin; Metformin and related; Penicillins; Amoxicillin; Biaxin [clarithromycin]; Ciprofloxacin; Eggs or egg-derived products; and Penicillin g   Social History:  The patient  reports that she has never smoked. She has never used smokeless tobacco. She reports that she does not drink alcohol or use drugs.   Family History:  The patient's  family history includes Diabetes in her brother, father, mother, and sister.    ROS:  Please see the history of present illness.   All other systems are reviewed and negative.    PHYSICAL EXAM: VS:  BP (!) 120/58   Pulse 74   Ht 5' 1.5" (1.562 m)   Wt 155 lb (70.3 kg)   BMI 28.81 kg/m  , BMI Body mass index is 28.81 kg/m. GEN: Well nourished, well developed, in no acute distress, sleepy in the office, poor historian HEENT: normal  Neck: no JVD, carotid bruits, or masses Cardiac: RRR; no murmurs, rubs, or gallops,no edema  Respiratory:  clear to auscultation bilaterally, normal work of breathing GI: soft, nontender, nondistended, + BS MS: no deformity or atrophy  Skin: warm and dry  Neuro:  Strength and sensation are intact Psych: euthymic mood, full affect  EKG:  EKG is ordered today. The ekg ordered today shows sinus rhythm 74 bpm, PR 134 msec, nonspecific ST/T changes   Recent Labs: 09/10/2015: ALT 17 01/31/2016: B Natriuretic Peptide 150.9; TSH 1.457 02/05/2016: BUN 28; Creatinine, Ser 1.36; Hemoglobin 10.5; Platelets 266; Potassium 3.7; Sodium 138    Lipid Panel     Component  Value Date/Time   CHOL (H) 09/09/2010 0110    206        ATP III CLASSIFICATION:  <200     mg/dL   Desirable  200-239  mg/dL   Borderline High  >=240    mg/dL   High          TRIG 129 09/09/2010 0110   HDL 51 09/09/2010 0110   CHOLHDL 4.0 09/09/2010 0110   VLDL 26 09/09/2010 0110   LDLCALC (H) 09/09/2010 0110    129        Total Cholesterol/HDL:CHD Risk Coronary Heart Disease Risk Table                     Men  Women  1/2 Average Risk   3.4   3.3  Average Risk       5.0   4.4  2 X Average Risk   9.6   7.1  3 X Average Risk  23.4   11.0        Use the calculated Patient Ratio above and the CHD Risk Table to determine the patient's CHD Risk.        ATP III CLASSIFICATION (LDL):  <100     mg/dL   Optimal  100-129  mg/dL   Near or Above                    Optimal  130-159  mg/dL   Borderline  160-189  mg/dL   High  >190     mg/dL   Very High     Wt Readings from Last 3 Encounters:  02/24/16 155 lb (70.3 kg)  02/13/16 148 lb (67.1 kg)  02/05/16 153 lb 12.8 oz (69.8 kg)      Other studies Reviewed: Additional studies/ records that were reviewed today include: Dr Tyrell Antonio notes, Dr Lonia Skinner notes, hospital records, prior echo, prior ekgs  Review of the above records today demonstrates: as above   ASSESSMENT AND PLAN:  1.  Atrial flutter I suspect this may have been isthmus dependant atrial flutter, though some ekgs are not typical.  She is a poor candidate for EP procedures.  She is clear that she does not want ablation.  I share Dr Tyrell Antonio thoughts from his prior note that given her comorbidities and severe LA enlargement that she is at risk for afib long term. I would therefore recommend rate control and anticoagulation (eliquis) at this time. Should she develop recurrent arrhythmias, AAD therapy may be preferred over ablation. Due to her concern for sleepiness, I did offer to switch her diltiazem to verapamil or metoprolol however she then stated that she actually  felt "pretty good" and did not wish to make any changes today.  I have encouraged her to discuss weaning xanax and possibly lyrica/ ultram with her PCP given her sleepiness.  I have also discussed sleep study which she declines.  Follow-up with Dr Fletcher Anon going forward I will see as needed   Signed, Thompson Grayer, MD  02/24/2016 5:16 PM     [place] [address] [phone] (office) [phone] (fax)

## 2016-02-26 ENCOUNTER — Other Ambulatory Visit: Payer: Self-pay | Admitting: *Deleted

## 2016-02-26 DIAGNOSIS — L89319 Pressure ulcer of right buttock, unspecified stage: Secondary | ICD-10-CM | POA: Diagnosis not present

## 2016-02-26 NOTE — Patient Outreach (Signed)
[place] [facility]) Care Management  02/26/2016  Kerry Barnes 10/19/1933 [id number]   Weekly transition of care call placed to member.  She state that she is not feeling well, but when asked to elaborate, she state that she is "fine."  She denies symptoms of complication of A-fib, report that she feel she has been more unsteady on her feet.  She state she has been using her cane for support more than usual.  This care manager inquired about member's follow up with Dr. Rayann Heman this week, member state that there were no concerns expressed by the physician and will follow up with her regular cardiologist within the next 2 weeks.  Inquired about MD's note regarding concerns for medications (Xanax, Ultram and Lyrica), member state she has been taking them without problems, does not feel that they are the cause of her unsteadiness.  She report that she does have another follow up with her PCP tomorrow and will discuss further.    Member able to verbalize sign/symptoms of a-fib, but is open to receiving additional information.  Will send member assigned EMMI education regarding management of A-fib.  Will follow up next week for ongoing transition of care.  [name], [place], MSN [place] [phone]

## 2016-02-27 ENCOUNTER — Encounter (INDEPENDENT_AMBULATORY_CARE_PROVIDER_SITE_OTHER): Payer: PPO | Admitting: Ophthalmology

## 2016-02-27 DIAGNOSIS — E113291 Type 2 diabetes mellitus with mild nonproliferative diabetic retinopathy without macular edema, right eye: Secondary | ICD-10-CM | POA: Diagnosis not present

## 2016-02-27 DIAGNOSIS — H35033 Hypertensive retinopathy, bilateral: Secondary | ICD-10-CM | POA: Diagnosis not present

## 2016-02-27 DIAGNOSIS — E11311 Type 2 diabetes mellitus with unspecified diabetic retinopathy with macular edema: Secondary | ICD-10-CM

## 2016-02-27 DIAGNOSIS — H43813 Vitreous degeneration, bilateral: Secondary | ICD-10-CM | POA: Diagnosis not present

## 2016-02-27 DIAGNOSIS — I10 Essential (primary) hypertension: Secondary | ICD-10-CM | POA: Diagnosis not present

## 2016-02-27 DIAGNOSIS — H34812 Central retinal vein occlusion, left eye, with macular edema: Secondary | ICD-10-CM | POA: Diagnosis not present

## 2016-02-27 DIAGNOSIS — E113312 Type 2 diabetes mellitus with moderate nonproliferative diabetic retinopathy with macular edema, left eye: Secondary | ICD-10-CM | POA: Diagnosis not present

## 2016-02-28 ENCOUNTER — Encounter (INDEPENDENT_AMBULATORY_CARE_PROVIDER_SITE_OTHER): Payer: PPO | Admitting: Ophthalmology

## 2016-03-04 DIAGNOSIS — R490 Dysphonia: Secondary | ICD-10-CM | POA: Diagnosis not present

## 2016-03-04 DIAGNOSIS — J039 Acute tonsillitis, unspecified: Secondary | ICD-10-CM | POA: Diagnosis not present

## 2016-03-05 ENCOUNTER — Other Ambulatory Visit: Payer: Self-pay | Admitting: *Deleted

## 2016-03-05 NOTE — Patient Outreach (Signed)
[place] [facility]) Care Management  03/05/2016  Kerry Barnes 06-11-1934 [id number]   Weekly transition of care call placed to member.  Member continues to deny any pain/discomfort, reports that she continues to monitor her blood pressure and heart rate.  She also report that she is compliant with her meds.  She denies signs/symptoms of A-fib.  She is able to voice symptoms and action plan if they occur.  EMMI education on A-fib mailed to member.  Discussed with member that all goals are complete.  She denies further need from [facility] care management services.  She is advised to contact this care manager in the future if her condition changes and she need more assistance.  Will close case and notify care management assistant and PCP.  [name], [place], MSN [place] [phone]

## 2016-03-07 ENCOUNTER — Encounter: Payer: Self-pay | Admitting: *Deleted

## 2016-03-10 ENCOUNTER — Ambulatory Visit (INDEPENDENT_AMBULATORY_CARE_PROVIDER_SITE_OTHER): Payer: PPO | Admitting: Cardiovascular Disease

## 2016-03-10 ENCOUNTER — Encounter: Payer: Self-pay | Admitting: Cardiovascular Disease

## 2016-03-10 VITALS — BP 140/78 | HR 53 | Ht 61.5 in | Wt 151.0 lb

## 2016-03-10 DIAGNOSIS — I5032 Chronic diastolic (congestive) heart failure: Secondary | ICD-10-CM

## 2016-03-10 DIAGNOSIS — I484 Atypical atrial flutter: Secondary | ICD-10-CM

## 2016-03-10 DIAGNOSIS — I10 Essential (primary) hypertension: Secondary | ICD-10-CM | POA: Diagnosis not present

## 2016-03-10 NOTE — Progress Notes (Signed)
Cardiology Office Note   Date:  03/10/2016   ID:  [name], DOB Oct 24, 1933, MRN [id number]  PCP:  Tula Nakayama  Cardiologist:   Kathlyn Sacramento, MD   No chief complaint on file.     History of Present Illness: Kerry Barnes is a 82 y.o. female who s here today for follow-up visit regarding chronic diastolic heart failure and recent atypical atrial flutter status post cardioversion to sinus rhythm . She has history of chronic diastolic heart failure(with moderate pulmonary hypertension),  Hypertension, mitral regurgitation, hypertension, hyperlipidemia, TIA previously on Plavix as well as diabetes mellitus.  She underwent a Lexiscan nuclear stress test in 09/2013 which was normal. ABI was done for atypical leg pain and was normal.  Carotid Doppler in November 2016 showed mild nonobstructive bilateral disease.  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  She was hospitalized in August with atypical atrial flutter with rapid ventricular response. She underwent successful TEE guided cardioversion. She was seen by Dr. Rayann Heman and was felt to be a poor candidate for ablation. She has been doing well and denies any chest pain or palpitations. She has chronic exertional dyspnea with no recent worsening.    Past Medical History:  Diagnosis Date  . Allergy   . Anxiety   . Arthritis   . Asthma   . Blood transfusion without reported diagnosis   . Cataract   . Diabetes mellitus   . Fibromyalgia   . GERD (gastroesophageal reflux disease)   . Hyperlipemia   . Hypertension   . IBS (irritable bowel syndrome)   . Stroke [facility]) 2013   TIA  . Tubular adenoma of colon     Past Surgical History:  Procedure Laterality Date  . ANKLE FRACTURE SURGERY    . CARDIOVERSION N/A 02/04/2016   Procedure: CARDIOVERSION;  Surgeon: Sueanne Margarita, MD;  Location: [facility] ENDOSCOPY;  Service: Cardiovascular;  Laterality: N/A;  . CHOLECYSTECTOMY    . TEE WITHOUT CARDIOVERSION N/A 02/04/2016   Procedure: TRANSESOPHAGEAL ECHOCARDIOGRAM (TEE);  Surgeon: Sueanne Margarita, MD;  Location: [facility] ENDOSCOPY;  Service: Cardiovascular;  Laterality: N/A;  . TUBAL LIGATION    . WRIST FRACTURE SURGERY       Current Outpatient Prescriptions  Medication Sig Dispense Refill  . acetaminophen (TYLENOL) 325 MG tablet Take 2 tablets (650 mg total) by mouth every 4 (four) hours as needed for headache or mild pain.    Marland Kitchen albuterol (PROVENTIL HFA;VENTOLIN HFA) 108 (90 BASE) MCG/ACT inhaler Inhale into the lungs every 6 (six) hours as needed for wheezing or shortness of breath.    . ALPRAZolam (XANAX) 0.5 MG tablet Take 0.5 mg by mouth 2 (two) times daily as needed for anxiety.    Marland Kitchen apixaban (ELIQUIS) 5 MG TABS tablet Take 1 tablet (5 mg total) by mouth 2 (two) times daily. 180 tablet 3  . atorvastatin (LIPITOR) 40 MG tablet Take 40 mg by mouth daily.    . cetirizine (ZYRTEC) 10 MG tablet Take 10 mg by mouth daily.    . clobetasol ointment (TEMOVATE) 3.84 % Apply 1 application topically 2 (two) times daily.    Marland Kitchen diltiazem (CARDIZEM CD) 180 MG 24 hr capsule Take 1 capsule (180 mg total) by mouth daily. 90 capsule 3  . EPINEPHrine (EPI-PEN) 0.3 mg/0.3 mL SOAJ injection Inject into the muscle once.    Marland Kitchen  FLUOCINOLONE ACETONIDE BODY 0.01 % OIL Apply 1 application topically daily.    . Fluticasone-Salmeterol (ADVAIR) 100-50 MCG/DOSE AEPB Inhale 1 puff into the lungs every 12 (twelve) hours.    . insulin glargine (LANTUS) 100 UNIT/ML injection Inject 24 Units into the skin at bedtime.     Marland Kitchen LINZESS 145 MCG CAPS capsule TAKE 1 CAPSULE(145 MCG) BY MOUTH DAILY 30 capsule 3  . meclizine (ANTIVERT) 25 MG tablet Take 25 mg by mouth 3 (three) times daily as needed for dizziness.     . mometasone (NASONEX) 50 MCG/ACT nasal spray Place 2  sprays into the nose daily.    Marland Kitchen olopatadine (PATANOL) 0.1 % ophthalmic solution Place 1 drop into both eyes 2 (two) times daily.     . pantoprazole (PROTONIX) 40 MG tablet Take 1 tablet (40 mg total) by mouth daily. 30 tablet 11  . pregabalin (LYRICA) 75 MG capsule Take 75 mg by mouth 2 (two) times daily.    Marland Kitchen Propylene Glycol (SYSTANE BALANCE OP) Apply 1 drop to eye daily as needed (dryness).    . traMADol (ULTRAM) 50 MG tablet Take 50 mg by mouth at bedtime as needed for severe pain.      No current facility-administered medications for this visit.     Allergies:   Codeine; Metformin; Metformin and related; Penicillins; Amoxicillin; Biaxin [clarithromycin]; Ciprofloxacin; Eggs or egg-derived products; and Penicillin g    Social History:  The patient  reports that she has never smoked. She has never used smokeless tobacco. She reports that she does not drink alcohol or use drugs.   Family History:  The patient's family history includes Diabetes in her brother, father, mother, and sister.    ROS:  Please see the history of present illness.   Otherwise, review of systems are positive for none.   All other systems are reviewed and negative.    PHYSICAL EXAM: VS:  There were no vitals taken for this visit. , BMI There is no height or weight on file to calculate BMI. GEN: Well nourished, well developed, in no acute distress  HEENT: normal  Neck: no JVD, carotid bruits, or masses Cardiac: RRR; no murmurs, rubs, or gallops,no edema  Respiratory:  clear to auscultation bilaterally, normal work of breathing GI: soft, nontender, nondistended, + BS MS: no deformity or atrophy  Skin: warm and dry, no rash Neuro:  Strength and sensation are intact Psych: euthymic mood, full affect   EKG:  EKG is ordered today. The ekg ordered today demonstrates normal sinus rhythm with lateral T-wave changes which are not new.   Recent Labs: 09/10/2015: ALT 17 01/31/2016: B Natriuretic Peptide 150.9; TSH  1.457 02/05/2016: BUN 28; Creatinine, Ser 1.36; Hemoglobin 10.5; Platelets 266; Potassium 3.7; Sodium 138    Lipid Panel    Component Value Date/Time   CHOL (H) 09/09/2010 0110    206        ATP III CLASSIFICATION:  <200     mg/dL   Desirable  200-239  mg/dL   Borderline High  >=240    mg/dL   High          TRIG 129 09/09/2010 0110   HDL 51 09/09/2010 0110   CHOLHDL 4.0 09/09/2010 0110   VLDL 26 09/09/2010 0110   LDLCALC (H) 09/09/2010 0110    129        Total Cholesterol/HDL:CHD Risk Coronary Heart Disease Risk Table  Men   Women  1/2 Average Risk   3.4   3.3  Average Risk       5.0   4.4  2 X Average Risk   9.6   7.1  3 X Average Risk  23.4   11.0        Use the calculated Patient Ratio above and the CHD Risk Table to determine the patient's CHD Risk.        ATP III CLASSIFICATION (LDL):  <100     mg/dL   Optimal  100-129  mg/dL   Near or Above                    Optimal  130-159  mg/dL   Borderline  160-189  mg/dL   High  >190     mg/dL   Very High      Wt Readings from Last 3 Encounters:  02/24/16 155 lb (70.3 kg)  02/13/16 148 lb (67.1 kg)  02/05/16 153 lb 12.8 oz (69.8 kg)        ASSESSMENT AND PLAN:  1. Paroxysmal atrial flutter: Successful cardioversion in August and currently she is maintaining in sinus rhythm. Continue treatment with diltiazem. She is on long-term anticoagulation with Eliquis with no bleeding complications. Repeat CBC and be met in 3 months. Continue same medications for now. If she develops recurrent atrial arrhythmia, she might need an antiarrhythmic medication.   2. Chronic diastolic heart failure:  She appears to be euvolemic without diuretics.  3. Essential hypertension: Blood pressure is well controlled on current medications.    Disposition:   FU with me in 6 months  Signed, Kathlyn Sacramento, MD  03/10/2016 8:26 AM    [place]

## 2016-03-10 NOTE — Patient Instructions (Signed)
Medication Instructions:  Your physician recommends that you continue on your current medications as directed. Please refer to the Current Medication list given to you today.  Labwork: Your physician recommends that you have lab work in 3 MONTHS: BMP and CBC  Testing/Procedures: No new orders.   Follow-Up: Your physician wants you to follow-up in: 6 MONTHS with Dr Fletcher Anon.  You will receive a reminder letter in the mail two months in advance. If you don't receive a letter, please call our office to schedule the follow-up appointment.   Any Other Special Instructions Will Be Listed Below (If Applicable).     If you need a refill on your cardiac medications before your next appointment, please call your pharmacy.

## 2016-03-13 ENCOUNTER — Encounter: Payer: Self-pay | Admitting: *Deleted

## 2016-03-16 DIAGNOSIS — R490 Dysphonia: Secondary | ICD-10-CM | POA: Diagnosis not present

## 2016-03-16 DIAGNOSIS — J040 Acute laryngitis: Secondary | ICD-10-CM | POA: Diagnosis not present

## 2016-03-16 DIAGNOSIS — J320 Chronic maxillary sinusitis: Secondary | ICD-10-CM | POA: Diagnosis not present

## 2016-03-16 DIAGNOSIS — J322 Chronic ethmoidal sinusitis: Secondary | ICD-10-CM | POA: Diagnosis not present

## 2016-03-16 DIAGNOSIS — J3501 Chronic tonsillitis: Secondary | ICD-10-CM | POA: Diagnosis not present

## 2016-03-16 DIAGNOSIS — H6523 Chronic serous otitis media, bilateral: Secondary | ICD-10-CM | POA: Diagnosis not present

## 2016-03-23 ENCOUNTER — Encounter (INDEPENDENT_AMBULATORY_CARE_PROVIDER_SITE_OTHER): Payer: PPO | Admitting: Ophthalmology

## 2016-03-23 DIAGNOSIS — E113291 Type 2 diabetes mellitus with mild nonproliferative diabetic retinopathy without macular edema, right eye: Secondary | ICD-10-CM | POA: Diagnosis not present

## 2016-03-23 DIAGNOSIS — E113212 Type 2 diabetes mellitus with mild nonproliferative diabetic retinopathy with macular edema, left eye: Secondary | ICD-10-CM

## 2016-03-23 DIAGNOSIS — E11311 Type 2 diabetes mellitus with unspecified diabetic retinopathy with macular edema: Secondary | ICD-10-CM

## 2016-03-23 DIAGNOSIS — H34812 Central retinal vein occlusion, left eye, with macular edema: Secondary | ICD-10-CM | POA: Diagnosis not present

## 2016-03-23 DIAGNOSIS — H43813 Vitreous degeneration, bilateral: Secondary | ICD-10-CM | POA: Diagnosis not present

## 2016-03-23 DIAGNOSIS — H35033 Hypertensive retinopathy, bilateral: Secondary | ICD-10-CM | POA: Diagnosis not present

## 2016-03-23 DIAGNOSIS — I10 Essential (primary) hypertension: Secondary | ICD-10-CM | POA: Diagnosis not present

## 2016-03-26 ENCOUNTER — Other Ambulatory Visit: Payer: Self-pay | Admitting: Pharmacist

## 2016-03-26 NOTE — Patient Outreach (Addendum)
[place] [facility]) Care Management  03/26/2016  Kerry Barnes 09-Feb-1934 [id number]  Patient was referred to [place] by [facility], for medication review due to being on greater than 15 medications.  Unsuccessful phone outreach to patient, no answer, left a HIPAA compliant voice message requesting a return call.   Plan:  Will make another outreach attempt within the next week.   [name], PharmD, [place] [phone]   Addendum:  [phone]:  Received a voicemail from patient and placed return call to patient.  She answered and verified HIPAA details.  Purpose of call was explained to patient, and patient was willing to review her medications over the phone but requested it be done later as she was out at time of call.  She said tomorrow would be okay.   Plan:  Will make outreach attempt to patient per her request at a later time.

## 2016-03-27 ENCOUNTER — Other Ambulatory Visit: Payer: Self-pay | Admitting: Pharmacist

## 2016-03-27 NOTE — Patient Outreach (Signed)
[facility]) Care Management  [place]   03/27/2016  ARSHIA NUNLEY [birth year] [id number]  Late entry for 03/27/16.    Subjective:  Patient was referred by [facility], for a medication review due to being on greater than 15 medications.   Phone call was placed to patient to review her medications over the phone, she declined needing a [facility] Pharmacist home visit.   Patient verified her HIPAA details.    Patient's past medical history is significant for hypertension, hyperlipidemia, asthma, atrial flutter, type 2 diabetes mellitus, CKD.    Patient denies difficulty affording her medications.  She states that she uses a pill box and denies difficulty filling her pill box.    Medications were reviewed by comparing medication list in the chart, patient report, and hospital discharge medication list from 02/05/16.    Objective:   Current Medications: Current Outpatient Prescriptions  Medication Sig Dispense Refill  . acetaminophen (TYLENOL) 325 MG tablet Take 2 tablets (650 mg total) by mouth every 4 (four) hours as needed for headache or mild pain.    Marland Kitchen albuterol (PROVENTIL HFA;VENTOLIN HFA) 108 (90 BASE) MCG/ACT inhaler Inhale into the lungs every 6 (six) hours as needed for wheezing or shortness of breath.    . ALPRAZolam (XANAX) 0.5 MG tablet Take 0.5 mg by mouth 2 (two) times daily as needed for anxiety.    Marland Kitchen apixaban (ELIQUIS) 5 MG TABS tablet Take 1 tablet (5 mg total) by mouth 2 (two) times daily. 180 tablet 3  . atorvastatin (LIPITOR) 40 MG tablet Take 40 mg by mouth daily.    . cetirizine (ZYRTEC) 10 MG tablet Take 10 mg by mouth daily.    . clobetasol ointment (TEMOVATE) [id number] % Apply 1 application topically 2 (two) times daily.    Marland Kitchen diltiazem (CARDIZEM CD) 180 MG 24 hr capsule Take 1 capsule (180 mg total) by mouth daily. 90 capsule 3  . EPINEPHrine (EPI-PEN) 0.3 mg/0.3 mL SOAJ injection Inject into the muscle once.    Marland Kitchen  FLUOCINOLONE ACETONIDE BODY 0.01 % OIL Apply 1 application topically daily.    . Fluticasone-Salmeterol (ADVAIR) 100-50 MCG/DOSE AEPB Inhale 1 puff into the lungs every 12 (twelve) hours.    . insulin glargine (LANTUS) 100 UNIT/ML injection Inject 24 Units into the skin at bedtime.     Marland Kitchen LINZESS 145 MCG CAPS capsule TAKE 1 CAPSULE(145 MCG) BY MOUTH DAILY 30 capsule 3  . meclizine (ANTIVERT) 25 MG tablet Take 25 mg by mouth 3 (three) times daily as needed for dizziness.     . mometasone (NASONEX) 50 MCG/ACT nasal spray Place 2 sprays into the nose daily.    Marland Kitchen olopatadine (PATANOL) 0.1 % ophthalmic solution Place 1 drop into both eyes 2 (two) times daily.     . pantoprazole (PROTONIX) 40 MG tablet Take 1 tablet (40 mg total) by mouth daily. 30 tablet 11  . pregabalin (LYRICA) 75 MG capsule Take 75 mg by mouth 2 (two) times daily.    Marland Kitchen Propylene Glycol (SYSTANE BALANCE OP) Apply 1 drop to eye daily as needed (dryness).    . traMADol (ULTRAM) 50 MG tablet Take 50 mg by mouth at bedtime as needed for severe pain.      No current facility-administered medications for this visit.     Functional Status: In your present state of health, do you have any difficulty performing the following activities: 02/13/2016 01/31/2016  Hearing? N N  Vision? N N  Difficulty  concentrating or making decisions? N N  Walking or climbing stairs? N N  Dressing or bathing? N N  Doing errands, shopping? N N  Preparing Food and eating ? N -  Using the Toilet? N -  In the past six months, have you accidently leaked urine? N -  Do you have problems with loss of bowel control? N -  Managing your Medications? N -  Managing your Finances? N -  Housekeeping or managing your Housekeeping? N -  Some recent data might be hidden    Fall/Depression Screening: PHQ 2/9 Scores 02/13/2016  PHQ - 2 Score 0    Assessment:   Drugs sorted by system:  Neurologic/Psychologic: -alprazolam  Cardiovascular: -apixaban (Eliquis)   -atorvastatin -diltiazem   Pulmonary/Allergy: -albuterol inhaler---patient reports using about twice a day -cetirizine -epi pen -fluticasone/salmeterol (Advair)  -mometasone furoate (Nasonex)  -olopatadine (Patanol) eye drops  Gastrointestinal: -linaclotide (Linzess)  -pantoprazole   Endocrine: -insulin glargine (Lantus)  Topical: -clobetasol propionate  -fluocinolone acetonide  Pain: -acetaminophen as needed -pregabalin (Lyrica)  -tramadol  Miscellaneous: -meclizine as needed  Duplications in therapy:  -patient reports using clobetasol and fluocinolone topically---suggest ensuring patient is not using both products on the same areas concurrently given therapeutic duplication  Gaps in therapy:  -patient appears to have type 2 diabetes and CKD---olmesartan/hydrochlorothiazide was held following 02/05/16 hospital discharge due to AKI---suggest evaluating if low dose ARB is therapeutically appropriate once her renal function returns to baseline  Other issues: Patient is on alprazolam and meclizine which can both cause sedation---suggest close monitoring of side effects given increased risk of falls in 80 year old population.    Patient education:  Patient counseling was provided for apixaban including but not limited to the indication, side effects, and how to take it.  She was counseled on the signs/symptoms of bleeding or bruising.  Plan:  Will route this note and medication review to patient's PCP.    Will not open pharmacy case as patient denies further concerns.    Patient has [facility] Pharmacist phone number should she have future pharmacy needs.    [name], PharmD, [place] [phone]

## 2016-03-30 ENCOUNTER — Ambulatory Visit: Payer: PPO | Admitting: Pharmacist

## 2016-04-02 ENCOUNTER — Telehealth: Payer: Self-pay | Admitting: Cardiovascular Disease

## 2016-04-02 NOTE — Telephone Encounter (Signed)
New Message  Pt call requesting to speak with RN. Pt states she took her bp this morning 10/19 and it was 139/71 pulse 146.... Pt states the walker she uses; pt states she has to pick it up and put it in the car when leaving home. Pt would like to know could lifting this equipment cause her bp and pulse to increase. Please call back to discuss

## 2016-04-02 NOTE — Telephone Encounter (Signed)
Pt of Dr. Fletcher Anon  Hx a flutter w RVR, Returned call to patient. She reports finding of high HR on her automatic BP cuff today. Patient denies dizziness, diaphoresis, chest pain, shortness of breath.  Patient reports that she picked her walker up and put it in the car this morning. She then went back in the house to check to see if it bothered her heart rate.  Reports a reading of 139/71 HR 146  She rechecked her BP while on the phone.  126/85 HR **  Instructed patient on potential inaccuracy of BP cuff in determining HR, esp w A Fib/A Flutter. Noted hx of HR variability on standard VS checks in office. Asked if she would be able to check a radial pulse, which she performed. Noted on this observation that the heart rate "was a lot lower".  Advised if any symptoms to call. Would route msg to Dr. Claiborne Billings ([facility]) to see if any further recommendations at this time.

## 2016-04-05 NOTE — Telephone Encounter (Signed)
agree

## 2016-04-06 DIAGNOSIS — J322 Chronic ethmoidal sinusitis: Secondary | ICD-10-CM | POA: Diagnosis not present

## 2016-04-06 DIAGNOSIS — J040 Acute laryngitis: Secondary | ICD-10-CM | POA: Diagnosis not present

## 2016-04-06 DIAGNOSIS — J320 Chronic maxillary sinusitis: Secondary | ICD-10-CM | POA: Diagnosis not present

## 2016-04-06 DIAGNOSIS — J301 Allergic rhinitis due to pollen: Secondary | ICD-10-CM | POA: Diagnosis not present

## 2016-04-14 DIAGNOSIS — L299 Pruritus, unspecified: Secondary | ICD-10-CM | POA: Diagnosis not present

## 2016-04-16 ENCOUNTER — Telehealth: Payer: Self-pay | Admitting: Internal Medicine

## 2016-04-16 NOTE — Telephone Encounter (Signed)
Spoke with patient in length about history of heart beating in her ear earlier this year. She was concerned she should have gone to the ED prior to the time she actually was admitted in 01/31/16. She was concerned about being prescribed Pantoprazole because it said "sodium" with the drug name. Advised patient that was part of composition but ok and would not cause her to have too much sodium.  We then discussed her BP. Yesterday, 11/1 in am before meds BP 143/103, HR 101.  Midday- 149/84, HR 67  Bedtime 166/99, HR 76 with a dull H/A- she took tylenol and went to bed.  No headache this morning. Denies SOB, CP, Dizziness. She is concerned about BP being high. Advised that sometimes you may have higher readings but we prefer to look at the trend. She takes her BP 3 times a day and has the readings recorded. She wanted to be seen by doctor to discuss. Appt made with Kerin Ransom for tomorrow, 11/3. Advised patient to bring the readings with her to appt. Verified patient is taking medication as prescribed.

## 2016-04-16 NOTE — Telephone Encounter (Signed)
Dr Fletcher Anon patient, will forward to NL

## 2016-04-16 NOTE — Telephone Encounter (Signed)
New message  Pt is concerned about BP and heart rate keeps going up since going on BP meds  Readings: 10/31: BP 174/85 HR 81/ later: 174/88 90 HR  11/1: BP 143/103 HR 101  11/2: BP 149/84 HR 67  No appetite, can't sleep  Please call pt back

## 2016-04-17 ENCOUNTER — Encounter: Payer: Self-pay | Admitting: Internal Medicine

## 2016-04-17 ENCOUNTER — Encounter: Payer: Self-pay | Admitting: Cardiology

## 2016-04-17 ENCOUNTER — Ambulatory Visit (INDEPENDENT_AMBULATORY_CARE_PROVIDER_SITE_OTHER): Payer: PPO | Admitting: Cardiology

## 2016-04-17 DIAGNOSIS — E1122 Type 2 diabetes mellitus with diabetic chronic kidney disease: Secondary | ICD-10-CM

## 2016-04-17 DIAGNOSIS — Z7901 Long term (current) use of anticoagulants: Secondary | ICD-10-CM | POA: Diagnosis not present

## 2016-04-17 DIAGNOSIS — Z794 Long term (current) use of insulin: Secondary | ICD-10-CM

## 2016-04-17 DIAGNOSIS — I10 Essential (primary) hypertension: Secondary | ICD-10-CM

## 2016-04-17 DIAGNOSIS — I4892 Unspecified atrial flutter: Secondary | ICD-10-CM

## 2016-04-17 DIAGNOSIS — N183 Chronic kidney disease, stage 3 unspecified: Secondary | ICD-10-CM | POA: Insufficient documentation

## 2016-04-17 DIAGNOSIS — N2889 Other specified disorders of kidney and ureter: Secondary | ICD-10-CM | POA: Insufficient documentation

## 2016-04-17 MED ORDER — AMIODARONE HCL 200 MG PO TABS: 200.0000 mg | ORAL_TABLET | Freq: Two times a day (BID) | ORAL | 3 refills | Status: DC

## 2016-04-17 NOTE — Patient Instructions (Signed)
Medication Instructions:  START AMIODARONE 200MG  TWICE DAILY   If you need a refill on your cardiac medications before your next appointment, please call your pharmacy.  Labwork: 2 DAYS BEFORE PROCEDURE PT/INR, CMET,MAG AND CBC AT [facility]  Testing/Procedures: DC CARDIOVERSION IN 2 WEEKS  Follow-Up: Your physician recommends that you schedule a follow-up appointment in:  [place]     Thank you for choosing [facility]!!

## 2016-04-17 NOTE — Addendum Note (Signed)
Addended by: Erlene Quan on: 04/17/2016 05:05 PM   Modules accepted: Orders, SmartSet

## 2016-04-17 NOTE — Assessment & Plan Note (Signed)
SCr1.36

## 2016-04-17 NOTE — Assessment & Plan Note (Addendum)
HR 130 in the office today-A flutter

## 2016-04-17 NOTE — Progress Notes (Signed)
04/17/2016 [name]   09-27-1933  [id number]  Primary Physician Bing Matter, PA-C Primary Cardiologist: Dr April Holding  HPI:  82 yo AA female with PMH of diastolic heart failure, palpitations related to PACs, moderate pulmonary hypertension, hypertension, mitral regurg, hyperlipidemia, TIA- currently on Plavix, and diabetes mellitus type 2 with CRI who presented to [facility] ED 01/31/16 with complaints of centralized chest pain, and feeling like her heart was racing. She was found to be in new onset a-flutter RVR with RVR. She had chest pain, inferior ST depression, and hypotension with this. Her chest pain and EKG changes improved with rate control. She is a CHADs VASc=8 and Eliquis was started. Her EKGs were reviewed by Dr Lovena Le during her hospitalization which suggested possibility of an atypical atrial flutter circuit.  She therefore did not receive ablation but was cardioverted to sinus.This was done 02/04/16 and was successful. Echo showed an EF of 65-70% with moderate to severe LAE.   She recently called the office saying her B/P was elevated and asked to be seen. She denies any chest pain or dyspnea. She went [place] fishing the other day and felt fine. In the office her HR is 123- "NSR-sinus tach" but I believe this is atrial flutter. She denies any recent illness or new medications.    Current Outpatient Prescriptions  Medication Sig Dispense Refill  . acetaminophen (TYLENOL) 325 MG tablet Take 2 tablets (650 mg total) by mouth every 4 (four) hours as needed for headache or mild pain.    Marland Kitchen albuterol (PROVENTIL HFA;VENTOLIN HFA) 108 (90 BASE) MCG/ACT inhaler Inhale into the lungs every 6 (six) hours as needed for wheezing or shortness of breath.    . ALPRAZolam (XANAX) 0.5 MG tablet Take 0.5 mg by mouth 2 (two) times daily as needed for anxiety.    Marland Kitchen apixaban (ELIQUIS) 5 MG TABS tablet Take 1 tablet (5 mg total) by mouth 2 (two) times daily. 180 tablet 3  . atorvastatin  (LIPITOR) 40 MG tablet Take 40 mg by mouth daily.    . cetirizine (ZYRTEC) 10 MG tablet Take 10 mg by mouth daily.    . clobetasol ointment (TEMOVATE) AB-123456789 % Apply 1 application topically 2 (two) times daily.    Marland Kitchen diltiazem (CARDIZEM CD) 180 MG 24 hr capsule Take 1 capsule (180 mg total) by mouth daily. 90 capsule 3  . EPINEPHrine (EPI-PEN) 0.3 mg/0.3 mL SOAJ injection Inject into the muscle once.    Marland Kitchen FLUOCINOLONE ACETONIDE BODY 0.01 % OIL Apply 1 application topically daily.    . Fluticasone-Salmeterol (ADVAIR) 100-50 MCG/DOSE AEPB Inhale 1 puff into the lungs every 12 (twelve) hours.    . insulin glargine (LANTUS) 100 UNIT/ML injection Inject 24 Units into the skin at bedtime.     Marland Kitchen levocetirizine (XYZAL) 5 MG tablet     . LINZESS 145 MCG CAPS capsule TAKE 1 CAPSULE(145 MCG) BY MOUTH DAILY 30 capsule 3  . meclizine (ANTIVERT) 25 MG tablet Take 25 mg by mouth 3 (three) times daily as needed for dizziness.     . mometasone (NASONEX) 50 MCG/ACT nasal spray Place 2 sprays into the nose daily.    . montelukast (SINGULAIR) 10 MG tablet     . olopatadine (PATANOL) 0.1 % ophthalmic solution Place 1 drop into both eyes 2 (two) times daily.     . pantoprazole (PROTONIX) 40 MG tablet Take 1 tablet (40 mg total) by mouth daily. 30 tablet 11  . pregabalin (LYRICA) 75  MG capsule Take 75 mg by mouth 2 (two) times daily.    Marland Kitchen Propylene Glycol (SYSTANE BALANCE OP) Apply 1 drop to eye daily as needed (dryness).    . traMADol (ULTRAM) 50 MG tablet Take 50 mg by mouth at bedtime as needed for severe pain.      No current facility-administered medications for this visit.     Allergies  Allergen Reactions  . Codeine Nausea Only  . Metformin Diarrhea  . Metformin And Related Diarrhea  . Penicillins     rash  . Amoxicillin Rash  . Biaxin [Clarithromycin] Rash  . Ciprofloxacin Rash  . Eggs Or Egg-Derived Products Rash    Had welps as a child.  Has eaten eggs recently with no problems.  . Penicillin G  Rash    Social History   Social History  . Marital status: Married    Spouse name: N/A  . Number of children: 8  . Years of education: N/A   Occupational History  . Housewife Retired   Social History Main Topics  . Smoking status: Never Smoker  . Smokeless tobacco: Never Used  . Alcohol use No  . Drug use: No  . Sexual activity: Not on file   Other Topics Concern  . Not on file   Social History Narrative  . No narrative on file     Review of Systems: General: negative for chills, fever, night sweats or weight changes.  Cardiovascular: negative for chest pain, dyspnea on exertion, edema, orthopnea, palpitations, paroxysmal nocturnal dyspnea or shortness of breath Dermatological: negative for rash Respiratory: negative for cough or wheezing Urologic: negative for hematuria Abdominal: negative for nausea, vomiting, diarrhea, bright red blood per rectum, melena, or hematemesis Neurologic: negative for visual changes, syncope, or dizziness All other systems reviewed and are otherwise negative except as noted above.    Blood pressure 134/85, pulse (!) 130, height 5' 1.5" (1.562 m), weight 149 lb 3.2 oz (67.7 kg).  General appearance: alert, cooperative and no distress Neck: no carotid bruit and no JVD Lungs: crackles Rt base Heart: irregularly irregular rhythm Extremities: no edema Skin: Skin color, texture, turgor normal. No rashes or lesions Neurologic: Grossly normal  EKG A flutter with VR 123  ASSESSMENT AND PLAN:   Essential hypertension Pt was concerned about her B/P at home- taking it 3 times a day  Atrial flutter with rapid ventricular response (HCC) HR 130 in the office today-A flutter  Chronic anticoagulation Eliquis  Chronic renal insufficiency, stage III (moderate) SCr1.36  Type 2 diabetes with stage 3 chronic kidney disease GFR 30-59 (HCC) IDDM   PLAN  I reviewed Dr Rayann Heman and Dr Tyrell Antonio notes. Will add Amiodarone 200 mg BID and plan OP DCCV  in two weeks. She tells me she has not missed any doses of her Eliquis. I told her not to worry about her B/P being labile until we get her heart rhythm straightened out. Check CMET 2 days before DCCV. TSH was normal in Aug.   Kerin Ransom PA-C 04/17/2016 4:04 PM

## 2016-04-17 NOTE — Assessment & Plan Note (Signed)
IDDM 

## 2016-04-17 NOTE — Assessment & Plan Note (Signed)
Pt was concerned about her B/P at home- taking it 3 times a day

## 2016-04-17 NOTE — Assessment & Plan Note (Signed)
Eliquis 

## 2016-04-20 ENCOUNTER — Telehealth: Payer: Self-pay | Admitting: Cardiovascular Disease

## 2016-04-20 NOTE — Telephone Encounter (Signed)
Spoke to patient. Notes concern that she did not get amiodarone Rx until today. She took 1st dose this evening. I gave instruction to start tomorrow AM with her usual AM meds, take PM dose 12 hrs after AM dose, repeat this regimen daily. Pt voiced understanding. She had no further questions, but I did advise to let us know if she was having any issues or concerns. She's aware she may experience some sluggishness or decreased energy when starting this medication.  I informed patient I was going to follow up with her after I seek recommendation from Dr. Fletcher Anon -- note that she is set up for cardioversion on 11/17 but she will not be on the amiodarone for full 2 weeks due to delay in getting Rx from pharmacy. Should DCCV be postponed?

## 2016-04-20 NOTE — Telephone Encounter (Signed)
New message  Pt calling with concerns about new medication/aminodarone 200mg  1 tab/2x day  Pt is concerned about side effects  Please call back

## 2016-04-22 NOTE — Telephone Encounter (Signed)
No answer, goes to VM. OK to leave detailed msg per DPR. Left msg to proceed as planned w cardioversion, call if questions.

## 2016-04-22 NOTE — Telephone Encounter (Signed)
2 weeks of Amiodarone should be fine. Can proceed with cardioversion as planned.

## 2016-04-29 DIAGNOSIS — E1122 Type 2 diabetes mellitus with diabetic chronic kidney disease: Secondary | ICD-10-CM | POA: Diagnosis not present

## 2016-04-29 DIAGNOSIS — Z794 Long term (current) use of insulin: Secondary | ICD-10-CM | POA: Diagnosis not present

## 2016-04-29 DIAGNOSIS — N183 Chronic kidney disease, stage 3 (moderate): Secondary | ICD-10-CM | POA: Diagnosis not present

## 2016-04-29 DIAGNOSIS — Z7901 Long term (current) use of anticoagulants: Secondary | ICD-10-CM | POA: Diagnosis not present

## 2016-04-29 DIAGNOSIS — I10 Essential (primary) hypertension: Secondary | ICD-10-CM | POA: Diagnosis not present

## 2016-04-29 DIAGNOSIS — I4892 Unspecified atrial flutter: Secondary | ICD-10-CM | POA: Diagnosis not present

## 2016-04-29 LAB — COMPREHENSIVE METABOLIC PANEL
ALT: 13 U/L (ref 6–29)
AST: 22 U/L (ref 10–35)
Albumin: 4.2 g/dL (ref 3.6–5.1)
Alkaline Phosphatase: 82 U/L (ref 33–130)
BUN: 10 mg/dL (ref 7–25)
CO2: 30 mmol/L (ref 20–31)
Calcium: 9.8 mg/dL (ref 8.6–10.4)
Chloride: 104 mmol/L (ref 98–110)
Creat: 1.12 mg/dL — ABNORMAL HIGH (ref 0.60–0.88)
Glucose, Bld: 85 mg/dL (ref 65–99)
Potassium: 4.4 mmol/L (ref 3.5–5.3)
Sodium: 142 mmol/L (ref 135–146)
Total Bilirubin: 0.6 mg/dL (ref 0.2–1.2)
Total Protein: 7.4 g/dL (ref 6.1–8.1)

## 2016-04-29 LAB — CBC
HCT: 42.7 % (ref 35.0–45.0)
Hemoglobin: 13.5 g/dL (ref 11.7–15.5)
MCH: 25.9 pg — ABNORMAL LOW (ref 27.0–33.0)
MCHC: 31.6 g/dL — ABNORMAL LOW (ref 32.0–36.0)
MCV: 82 fL (ref 80.0–100.0)
MPV: 9.9 fL (ref 7.5–12.5)
Platelets: 366 10*3/uL (ref 140–400)
RBC: 5.21 MIL/uL — ABNORMAL HIGH (ref 3.80–5.10)
RDW: 16.1 % — ABNORMAL HIGH (ref 11.0–15.0)
WBC: 6.2 10*3/uL (ref 3.8–10.8)

## 2016-04-29 LAB — MAGNESIUM: Magnesium: 1.9 mg/dL (ref 1.5–2.5)

## 2016-04-29 LAB — PROTIME-INR
INR: 1.1
Prothrombin Time: 11.6 s — ABNORMAL HIGH (ref 9.0–11.5)

## 2016-05-01 ENCOUNTER — Ambulatory Visit (HOSPITAL_COMMUNITY): Payer: PPO | Admitting: Certified Registered Nurse Anesthetist

## 2016-05-01 ENCOUNTER — Encounter (HOSPITAL_COMMUNITY): Payer: Self-pay | Admitting: Certified Registered Nurse Anesthetist

## 2016-05-01 ENCOUNTER — Encounter (HOSPITAL_COMMUNITY)
Admission: RE | Disposition: A | Discharge status: Home / Self Care | Payer: Self-pay | Source: Ambulatory Visit | Attending: Internal Medicine

## 2016-05-01 ENCOUNTER — Ambulatory Visit (HOSPITAL_COMMUNITY)
Admission: RE | Admit: 2016-05-01 | Discharge: 2016-05-01 | Disposition: A | Discharge status: Home / Self Care | Payer: PPO | Source: Ambulatory Visit | Attending: Internal Medicine | Admitting: Internal Medicine

## 2016-05-01 DIAGNOSIS — N183 Chronic kidney disease, stage 3 (moderate): Secondary | ICD-10-CM | POA: Diagnosis not present

## 2016-05-01 DIAGNOSIS — J45909 Unspecified asthma, uncomplicated: Secondary | ICD-10-CM | POA: Diagnosis not present

## 2016-05-01 DIAGNOSIS — E785 Hyperlipidemia, unspecified: Secondary | ICD-10-CM | POA: Insufficient documentation

## 2016-05-01 DIAGNOSIS — Z7902 Long term (current) use of antithrombotics/antiplatelets: Secondary | ICD-10-CM | POA: Diagnosis not present

## 2016-05-01 DIAGNOSIS — Z794 Long term (current) use of insulin: Secondary | ICD-10-CM | POA: Insufficient documentation

## 2016-05-01 DIAGNOSIS — Z79899 Other long term (current) drug therapy: Secondary | ICD-10-CM | POA: Diagnosis not present

## 2016-05-01 DIAGNOSIS — I4891 Unspecified atrial fibrillation: Secondary | ICD-10-CM

## 2016-05-01 DIAGNOSIS — I5032 Chronic diastolic (congestive) heart failure: Secondary | ICD-10-CM | POA: Diagnosis not present

## 2016-05-01 DIAGNOSIS — I130 Hypertensive heart and chronic kidney disease with heart failure and stage 1 through stage 4 chronic kidney disease, or unspecified chronic kidney disease: Secondary | ICD-10-CM | POA: Insufficient documentation

## 2016-05-01 DIAGNOSIS — Z8673 Personal history of transient ischemic attack (TIA), and cerebral infarction without residual deficits: Secondary | ICD-10-CM | POA: Diagnosis not present

## 2016-05-01 DIAGNOSIS — Z7901 Long term (current) use of anticoagulants: Secondary | ICD-10-CM | POA: Insufficient documentation

## 2016-05-01 DIAGNOSIS — E1122 Type 2 diabetes mellitus with diabetic chronic kidney disease: Secondary | ICD-10-CM | POA: Insufficient documentation

## 2016-05-01 DIAGNOSIS — M797 Fibromyalgia: Secondary | ICD-10-CM | POA: Diagnosis not present

## 2016-05-01 DIAGNOSIS — I4892 Unspecified atrial flutter: Secondary | ICD-10-CM | POA: Insufficient documentation

## 2016-05-01 HISTORY — PX: CARDIOVERSION: SHX1299

## 2016-05-01 LAB — GLUCOSE, CAPILLARY: Glucose-Capillary: 74 mg/dL (ref 65–99)

## 2016-05-01 SURGERY — CARDIOVERSION
Anesthesia: General

## 2016-05-01 MED ORDER — SODIUM CHLORIDE 0.9 % IV SOLN
INTRAVENOUS | Status: DC
  Administered 2016-05-01: 09:00:00 via INTRAVENOUS

## 2016-05-01 MED ORDER — PROPOFOL 10 MG/ML IV BOLUS
INTRAVENOUS | Status: DC | PRN
  Administered 2016-05-01: 50 mg via INTRAVENOUS

## 2016-05-01 MED ORDER — LIDOCAINE 2% (20 MG/ML) 5 ML SYRINGE
INTRAMUSCULAR | Status: DC | PRN
  Administered 2016-05-01: 100 mg via INTRAVENOUS

## 2016-05-01 NOTE — Anesthesia Procedure Notes (Signed)
Date/Time: 05/01/2016 9:50 AM Performed by: Lowella Dell Pre-anesthesia Checklist: Patient identified, Emergency Drugs available, Suction available, Patient being monitored and Timeout performed Patient Re-evaluated:Patient Re-evaluated prior to inductionOxygen Delivery Method: Ambu bag Preoxygenation: Pre-oxygenation with 100% oxygen Intubation Type: IV induction Ventilation: Mask ventilation without difficulty Dental Injury: Teeth and Oropharynx as per pre-operative assessment

## 2016-05-01 NOTE — Anesthesia Preprocedure Evaluation (Signed)
Anesthesia Evaluation    Airway Mallampati: II  TM Distance: >3 FB Neck ROM: Full    Dental no notable dental hx.    Pulmonary asthma ,    Pulmonary exam normal breath sounds clear to auscultation       Cardiovascular hypertension, Pt. on medications  Rhythm:Irregular Rate:Normal     Neuro/Psych TIA   GI/Hepatic   Endo/Other  diabetes, Type 2  Renal/GU Renal InsufficiencyRenal disease     Musculoskeletal  (+) Fibromyalgia -  Abdominal   Peds  Hematology   Anesthesia Other Findings   Reproductive/Obstetrics                             Anesthesia Physical Anesthesia Plan  ASA: III  Anesthesia Plan: General   Post-op Pain Management:    Induction: Intravenous  Airway Management Planned: Mask  Additional Equipment:   Intra-op Plan:   Post-operative Plan:   Informed Consent: I have reviewed the patients History and Physical, chart, labs and discussed the procedure including the risks, benefits and alternatives for the proposed anesthesia with the patient or authorized representative who has indicated his/her understanding and acceptance.   Dental advisory given  Plan Discussed with: CRNA  Anesthesia Plan Comments:         Anesthesia Quick Evaluation

## 2016-05-01 NOTE — Discharge Instructions (Signed)
Electrical Cardioversion, Care After °This sheet gives you information about how to care for yourself after your procedure. Your health care provider may also give you more specific instructions. If you have problems or questions, contact your health care provider. °What can I expect after the procedure? °After the procedure, it is common to have: °· Some redness on the skin where the shocks were given. °Follow these instructions at home: °· Do not drive for 24 hours if you were given a medicine to help you relax (sedative). °· Take over-the-counter and prescription medicines only as told by your health care provider. °· Ask your health care provider how to check your pulse. Check it often. °· Rest for 48 hours after the procedure or as told by your health care provider. °· Avoid or limit your caffeine use as told by your health care provider. °Contact a health care provider if: °· You feel like your heart is beating too quickly or your pulse is not regular. °· You have a serious muscle cramp that does not go away. °Get help right away if: °· You have discomfort in your chest. °· You are dizzy or you feel faint. °· You have trouble breathing or you are short of breath. °· Your speech is slurred. °· You have trouble moving an arm or leg on one side of your body. °· Your fingers or toes turn cold or blue. °This information is not intended to replace advice given to you by your health care provider. Make sure you discuss any questions you have with your health care provider. °Document Released: 03/22/2013 Document Revised: 01/03/2016 Document Reviewed: 12/06/2015 °Elsevier Interactive Patient Education © 2017 [employer] ° °

## 2016-05-01 NOTE — Transfer of Care (Signed)
Immediate Anesthesia Transfer of Care Note  Patient: Kerry Barnes  Procedure(s) Performed: Procedure(s): CARDIOVERSION (N/A)  Patient Location: PACU and Endoscopy Unit  Anesthesia Type:General  Level of Consciousness: awake, patient cooperative, lethargic and responds to stimulation  Airway & Oxygen Therapy: Patient Spontanous Breathing and Patient connected to nasal cannula oxygen  Post-op Assessment: Report given to RN and Post -op Vital signs reviewed and stable  Post vital signs: Reviewed and stable  Last Vitals:  Vitals:   05/01/16 0810  BP: (!) 158/92  Pulse: 95  Resp: 17  Temp: 36.9 C    Last Pain:  Vitals:   05/01/16 0810  TempSrc: Oral         Complications: No apparent anesthesia complications

## 2016-05-01 NOTE — Anesthesia Postprocedure Evaluation (Signed)
Anesthesia Post Note  Patient: Kerry Barnes  Procedure(s) Performed: Procedure(s) (LRB): CARDIOVERSION (N/A)  Patient location during evaluation: PACU Anesthesia Type: General Level of consciousness: awake and alert Pain management: pain level controlled Vital Signs Assessment: post-procedure vital signs reviewed and stable Respiratory status: spontaneous breathing, nonlabored ventilation, respiratory function stable and patient connected to nasal cannula oxygen Cardiovascular status: blood pressure returned to baseline and stable Postop Assessment: no signs of nausea or vomiting Anesthetic complications: no    Last Vitals:  Vitals:   05/01/16 0959 05/01/16 1000  BP: (!) 140/57   Pulse: 63 61  Resp: (!) 24   Temp: 36.8 C     Last Pain:  Vitals:   05/01/16 0959  TempSrc: Oral                 [name]

## 2016-05-01 NOTE — CV Procedure (Signed)
Patient sedated by anesthesia with 50 mg Propofol With pads in AP position, patient cardioverted to SR with 150 J synchronized biphasic energy. Procedure was without complication.

## 2016-05-01 NOTE — Interval H&P Note (Signed)
History and Physical Interval Note:  05/01/2016 8:20 AM  Kerry Barnes  has presented today for surgery, with the diagnosis of AFIB  The various methods of treatment have been discussed with the patient and family. After consideration of risks, benefits and other options for treatment, the patient has consented to  Procedure(s): CARDIOVERSION (N/A) as a surgical intervention .  The patient's history has been reviewed, patient examined, no change in status, stable for surgery.  I have reviewed the patient's chart and labs.  Questions were answered to the patient's satisfaction.     Dorris Carnes

## 2016-05-01 NOTE — H&P (View-Only) (Signed)
04/17/2016 [name]   10-01-1933  [id number]  Primary Physician Bing Matter, PA-C Primary Cardiologist: Dr April Holding  HPI:  82 yo AA female with PMH of diastolic heart failure, palpitations related to PACs, moderate pulmonary hypertension, hypertension, mitral regurg, hyperlipidemia, TIA- currently on Plavix, and diabetes mellitus type 2 with CRI who presented to the [facility] ED 01/31/16 with complaints of centralized chest pain, and feeling like her heart was racing. She was found to be in new onset a-flutter RVR with RVR. She had chest pain, inferior ST depression, and hypotension with this. Her chest pain and EKG changes improved with rate control. She is a CHADs VASc=8 and Eliquis was started. Her EKGs were reviewed by Dr Lovena Le during her hospitalization which suggested possibility of an atypical atrial flutter circuit.  She therefore did not receive ablation but was cardioverted to sinus.This was done 02/04/16 and was successful. Echo showed an EF of 65-70% with moderate to severe LAE.   She recently called the office saying her B/P was elevated and asked to be seen. She denies any chest pain or dyspnea. She went [place] fishing the other day and felt fine. In the office her HR is 123- "NSR-sinus tach" but I believe this is atrial flutter. She denies any recent illness or new medications.    Current Outpatient Prescriptions  Medication Sig Dispense Refill  . acetaminophen (TYLENOL) 325 MG tablet Take 2 tablets (650 mg total) by mouth every 4 (four) hours as needed for headache or mild pain.    Marland Kitchen albuterol (PROVENTIL HFA;VENTOLIN HFA) 108 (90 BASE) MCG/ACT inhaler Inhale into the lungs every 6 (six) hours as needed for wheezing or shortness of breath.    . ALPRAZolam (XANAX) 0.5 MG tablet Take 0.5 mg by mouth 2 (two) times daily as needed for anxiety.    Marland Kitchen apixaban (ELIQUIS) 5 MG TABS tablet Take 1 tablet (5 mg total) by mouth 2 (two) times daily. 180 tablet 3  . atorvastatin  (LIPITOR) 40 MG tablet Take 40 mg by mouth daily.    . cetirizine (ZYRTEC) 10 MG tablet Take 10 mg by mouth daily.    . clobetasol ointment (TEMOVATE) [id number] % Apply 1 application topically 2 (two) times daily.    Marland Kitchen diltiazem (CARDIZEM CD) 180 MG 24 hr capsule Take 1 capsule (180 mg total) by mouth daily. 90 capsule 3  . EPINEPHrine (EPI-PEN) 0.3 mg/0.3 mL SOAJ injection Inject into the muscle once.    Marland Kitchen FLUOCINOLONE ACETONIDE BODY 0.01 % OIL Apply 1 application topically daily.    . Fluticasone-Salmeterol (ADVAIR) 100-50 MCG/DOSE AEPB Inhale 1 puff into the lungs every 12 (twelve) hours.    . insulin glargine (LANTUS) 100 UNIT/ML injection Inject 24 Units into the skin at bedtime.     Marland Kitchen levocetirizine (XYZAL) 5 MG tablet     . LINZESS 145 MCG CAPS capsule TAKE 1 CAPSULE(145 MCG) BY MOUTH DAILY 30 capsule 3  . meclizine (ANTIVERT) 25 MG tablet Take 25 mg by mouth 3 (three) times daily as needed for dizziness.     . mometasone (NASONEX) 50 MCG/ACT nasal spray Place 2 sprays into the nose daily.    . montelukast (SINGULAIR) 10 MG tablet     . olopatadine (PATANOL) 0.1 % ophthalmic solution Place 1 drop into both eyes 2 (two) times daily.     . pantoprazole (PROTONIX) 40 MG tablet Take 1 tablet (40 mg total) by mouth daily. 30 tablet 11  . pregabalin (LYRICA) 75  MG capsule Take 75 mg by mouth 2 (two) times daily.    Marland Kitchen Propylene Glycol (SYSTANE BALANCE OP) Apply 1 drop to eye daily as needed (dryness).    . traMADol (ULTRAM) 50 MG tablet Take 50 mg by mouth at bedtime as needed for severe pain.      No current facility-administered medications for this visit.     Allergies  Allergen Reactions  . Codeine Nausea Only  . Metformin Diarrhea  . Metformin And Related Diarrhea  . Penicillins     rash  . Amoxicillin Rash  . Biaxin [Clarithromycin] Rash  . Ciprofloxacin Rash  . Eggs Or Egg-Derived Products Rash    Had welps as a child.  Has eaten eggs recently with no problems.  . Penicillin G  Rash    Social History   Social History  . Marital status: Married    Spouse name: N/A  . Number of children: 8  . Years of education: N/A   Occupational History  . Housewife Retired   Social History Main Topics  . Smoking status: Never Smoker  . Smokeless tobacco: Never Used  . Alcohol use No  . Drug use: No  . Sexual activity: Not on file   Other Topics Concern  . Not on file   Social History Narrative  . No narrative on file     Review of Systems: General: negative for chills, fever, night sweats or weight changes.  Cardiovascular: negative for chest pain, dyspnea on exertion, edema, orthopnea, palpitations, paroxysmal nocturnal dyspnea or shortness of breath Dermatological: negative for rash Respiratory: negative for cough or wheezing Urologic: negative for hematuria Abdominal: negative for nausea, vomiting, diarrhea, bright red blood per rectum, melena, or hematemesis Neurologic: negative for visual changes, syncope, or dizziness All other systems reviewed and are otherwise negative except as noted above.    Blood pressure 134/85, pulse (!) 130, height 5' 1.5" (1.562 m), weight 149 lb 3.2 oz (67.7 kg).  General appearance: alert, cooperative and no distress Neck: no carotid bruit and no JVD Lungs: crackles Rt base Heart: irregularly irregular rhythm Extremities: no edema Skin: Skin color, texture, turgor normal. No rashes or lesions Neurologic: Grossly normal  EKG A flutter with VR 123  ASSESSMENT AND PLAN:   Essential hypertension Pt was concerned about her B/P at home- taking it 3 times a day  Atrial flutter with rapid ventricular response (HCC) HR 130 in the office today-A flutter  Chronic anticoagulation Eliquis  Chronic renal insufficiency, stage III (moderate) SCr1.36  Type 2 diabetes with stage 3 chronic kidney disease GFR 30-59 (HCC) IDDM   PLAN  I reviewed Dr Rayann Heman and Dr Tyrell Antonio notes. Will add Amiodarone 200 mg BID and plan OP DCCV  in two weeks. She tells me she has not missed any doses of her Eliquis. I told her not to worry about her B/P being labile until we get her heart rhythm straightened out. Check CMET 2 days before DCCV. TSH was normal in Aug.   Kerin Ransom PA-C 04/17/2016 4:04 PM

## 2016-05-01 NOTE — Interval H&P Note (Signed)
History and Physical Interval Note:  05/01/2016 9:27 AM  [name]  has presented today for surgery, with the diagnosis of AFIB  The various methods of treatment have been discussed with the patient and family. After consideration of risks, benefits and other options for treatment, the patient has consented to  Procedure(s): CARDIOVERSION (N/A) as a surgical intervention .  The patient's history has been reviewed, patient examined, no change in status, stable for surgery.  I have reviewed the patient's chart and labs.  Questions were answered to the patient's satisfaction.     Kerry Barnes

## 2016-05-04 ENCOUNTER — Encounter (HOSPITAL_COMMUNITY): Payer: Self-pay | Admitting: Internal Medicine

## 2016-05-04 ENCOUNTER — Encounter (INDEPENDENT_AMBULATORY_CARE_PROVIDER_SITE_OTHER): Payer: PPO | Admitting: Ophthalmology

## 2016-05-08 ENCOUNTER — Other Ambulatory Visit: Payer: Self-pay | Admitting: Pharmacist

## 2016-05-08 ENCOUNTER — Telehealth: Payer: Self-pay | Admitting: Physician Assistant

## 2016-05-08 NOTE — Telephone Encounter (Signed)
    Patient called to report high Bps after her DCCV when her longtime Benicar HCT was discontinued for unclear reasons. Last creat was 1.12. I will have her restart this and call the office on Monday to possibly be seen if BPs do not improve. She will need a BMET to watch kidney function and K in ~ 1 week.   Angelena Form PA-C  [facility]

## 2016-05-08 NOTE — Patient Outreach (Signed)
[facility]) Care Management  05/08/2016  Kerry Barnes 1933/11/08 [id number]  Received a voicemail from patient stating she used to talk to a [facility] RN and didn't remember her name, but wished to speak with [facility] RN following a hospital discharge.   Unsure where patient was hospitalized,as she didn't indicate in her voicemail and this chart shows patient had a cardiovascular procedure.    [facility] Pharmacist placed a return call to patient at 1315.  There was no answer and phone just range with no voicemail/answering machine.   Plan:  Will send an in-basket message to [place], who was previously involved in patient's case.  Will make a follow-up attempt to patient in the next 2 weeks.   [name], PharmD, [place] [phone]

## 2016-05-11 ENCOUNTER — Telehealth: Payer: Self-pay | Admitting: *Deleted

## 2016-05-11 DIAGNOSIS — Z79899 Other long term (current) drug therapy: Secondary | ICD-10-CM

## 2016-05-11 NOTE — Telephone Encounter (Signed)
Called pt, per Nell Range, PA-C, to arrange for pt to come in office this week for BMET.  No answer, no machine.  Will try again later  jw 05-11-16

## 2016-05-12 NOTE — Telephone Encounter (Signed)
Called pt back, finally was able to speak with her. She will come in office, Thursday, 05/14/16 for BMET.  I did investigate her chart and she was d/c'd off Benicar-HCTZ at her discharge back in 02/05/16 for Hypotension, just fyi, cause I seen you restarted it.

## 2016-05-12 NOTE — Telephone Encounter (Signed)
Thanks. jen 

## 2016-05-13 ENCOUNTER — Other Ambulatory Visit: Payer: Self-pay | Admitting: *Deleted

## 2016-05-13 NOTE — Patient Outreach (Signed)
[facility]) Care Management  05/13/2016  Kerry Barnes December 17, 1933 [id number]   Notified by pharmacist, Gloris Manchester, that member was attempting to contact this care manager for a concern.  Call placed to member, identity verified.  She state that she recently had her heart "shocked" after which she was having trouble controlling her blood pressure.  She report she has discussed this with the cardiologist and was placed back on her Benicar.  She state that she has been monitoring her blood pressure since restarting, and will have follow up labs tomorrow and follow up appointment next week.  This care manager will not reopen case at this time but will follow up next week to verify blood pressure is back under control.  She denies any other concerns/questions.  [name], [place], MSN [place] [phone]

## 2016-05-14 ENCOUNTER — Other Ambulatory Visit: Payer: PPO

## 2016-05-14 ENCOUNTER — Other Ambulatory Visit: Payer: Self-pay | Admitting: *Deleted

## 2016-05-14 ENCOUNTER — Telehealth: Payer: Self-pay | Admitting: Cardiovascular Disease

## 2016-05-14 DIAGNOSIS — Z79899 Other long term (current) drug therapy: Secondary | ICD-10-CM

## 2016-05-14 LAB — BASIC METABOLIC PANEL
BUN: 13 mg/dL (ref 7–25)
CO2: 29 mmol/L (ref 20–31)
Calcium: 9.5 mg/dL (ref 8.6–10.4)
Chloride: 106 mmol/L (ref 98–110)
Creat: 1.17 mg/dL — ABNORMAL HIGH (ref 0.60–0.88)
Glucose, Bld: 96 mg/dL (ref 65–99)
Potassium: 4.3 mmol/L (ref 3.5–5.3)
Sodium: 142 mmol/L (ref 135–146)

## 2016-05-14 NOTE — Telephone Encounter (Signed)
Clarification after review, patient had come to [facility] rather than to [facility] lab as apparently instructed. I had to submit new order for this as the clinic collect couldn't be changed to lab collect on original order.

## 2016-05-14 NOTE — Telephone Encounter (Signed)
Pt is there waiting,needs a lab order.

## 2016-05-14 NOTE — Telephone Encounter (Signed)
Spoke to Gaffer at [place], orders released.

## 2016-05-17 ENCOUNTER — Encounter (HOSPITAL_COMMUNITY): Payer: Self-pay | Admitting: *Deleted

## 2016-05-17 ENCOUNTER — Emergency Department (HOSPITAL_COMMUNITY)
Admission: EM | Admit: 2016-05-17 | Discharge: 2016-05-17 | Disposition: A | Discharge status: Home / Self Care | Payer: PPO | Attending: Emergency Medicine | Admitting: Emergency Medicine

## 2016-05-17 ENCOUNTER — Emergency Department (HOSPITAL_COMMUNITY): Payer: PPO

## 2016-05-17 DIAGNOSIS — J45909 Unspecified asthma, uncomplicated: Secondary | ICD-10-CM | POA: Diagnosis not present

## 2016-05-17 DIAGNOSIS — E1122 Type 2 diabetes mellitus with diabetic chronic kidney disease: Secondary | ICD-10-CM | POA: Insufficient documentation

## 2016-05-17 DIAGNOSIS — R519 Headache, unspecified: Secondary | ICD-10-CM

## 2016-05-17 DIAGNOSIS — R51 Headache: Secondary | ICD-10-CM | POA: Insufficient documentation

## 2016-05-17 DIAGNOSIS — N183 Chronic kidney disease, stage 3 (moderate): Secondary | ICD-10-CM | POA: Insufficient documentation

## 2016-05-17 DIAGNOSIS — I130 Hypertensive heart and chronic kidney disease with heart failure and stage 1 through stage 4 chronic kidney disease, or unspecified chronic kidney disease: Secondary | ICD-10-CM | POA: Insufficient documentation

## 2016-05-17 DIAGNOSIS — I5032 Chronic diastolic (congestive) heart failure: Secondary | ICD-10-CM | POA: Insufficient documentation

## 2016-05-17 DIAGNOSIS — Z8673 Personal history of transient ischemic attack (TIA), and cerebral infarction without residual deficits: Secondary | ICD-10-CM | POA: Diagnosis not present

## 2016-05-17 DIAGNOSIS — Z7901 Long term (current) use of anticoagulants: Secondary | ICD-10-CM | POA: Insufficient documentation

## 2016-05-17 LAB — BASIC METABOLIC PANEL
Anion gap: 8 (ref 5–15)
BUN: 12 mg/dL (ref 6–20)
CO2: 23 mmol/L (ref 22–32)
Calcium: 9.1 mg/dL (ref 8.9–10.3)
Chloride: 109 mmol/L (ref 101–111)
Creatinine, Ser: 1.17 mg/dL — ABNORMAL HIGH (ref 0.44–1.00)
GFR calc Af Amer: 50 mL/min — ABNORMAL LOW (ref >60)
GFR calc non Af Amer: 43 mL/min — ABNORMAL LOW (ref >60)
Glucose, Bld: 129 mg/dL — ABNORMAL HIGH (ref 65–99)
Potassium: 3.8 mmol/L (ref 3.5–5.1)
Sodium: 140 mmol/L (ref 135–145)

## 2016-05-17 LAB — CBC
HCT: 39 % (ref 36.0–46.0)
Hemoglobin: 12.1 g/dL (ref 12.0–15.0)
MCH: 25.5 pg — ABNORMAL LOW (ref 26.0–34.0)
MCHC: 31 g/dL (ref 30.0–36.0)
MCV: 82.1 fL (ref 78.0–100.0)
Platelets: 265 10*3/uL (ref 150–400)
RBC: 4.75 MIL/uL (ref 3.87–5.11)
RDW: 16.3 % — ABNORMAL HIGH (ref 11.5–15.5)
WBC: 6.6 10*3/uL (ref 4.0–10.5)

## 2016-05-17 LAB — SEDIMENTATION RATE: Sed Rate: 5 mm/hr (ref 0–22)

## 2016-05-17 MED ORDER — METOCLOPRAMIDE HCL 5 MG/ML IJ SOLN
10.0000 mg | Freq: Once | INTRAMUSCULAR | Status: AC
  Administered 2016-05-17: 10 mg via INTRAVENOUS
  Filled 2016-05-17: qty 2

## 2016-05-17 NOTE — ED Notes (Signed)
Patient Alert and oriented X4. Stable and ambulatory. Patient verbalized understanding of the discharge instructions.  Patient belongings were taken by the patient.  

## 2016-05-17 NOTE — ED Triage Notes (Addendum)
Pt c/o left sided  headache x 2 days with one episode of vomiting. Denies photophobia. Also reports dizziness at times with headahces

## 2016-05-17 NOTE — ED Provider Notes (Signed)
[facility] DEPT Provider Note   CSN: [id number] Arrival date & time: 05/17/16  2703     History   Chief Complaint Chief Complaint  Patient presents with  . Headache    HPI Kerry Barnes is a 82 y.o. female.  The history is provided by the patient and medical records. No language interpreter was used.  Headache   Pertinent negatives include no fever, no palpitations, no shortness of breath, no nausea and no vomiting.   Kerry Barnes is a 82 y.o. female  with a PMH of afib on eliquis, CHF, HTN, HLD and DM who presents to the Emergency Department complaining of throbbing constant left-sided headache for the last day and a half. One episode of emesis yesterday, but no n/v today. No photophobia or visual changes. She took a tylenol with mild relief in symptoms. No alleviating or aggravating factors noted. Patient endorses history of vertigo intermittently for the last year and a half, but denies any dizziness associated with her symptoms for the last 2 days. Spouse at bedside denies any slurred speech, mental status changes or gait abnormalities. No fevers or neck pain.   Past Medical History:  Diagnosis Date  . Allergy   . Anxiety   . Arthritis   . Asthma   . Blood transfusion without reported diagnosis   . Cataract   . Diabetes mellitus   . Fibromyalgia   . GERD (gastroesophageal reflux disease)   . Hyperlipemia   . Hypertension   . IBS (irritable bowel syndrome)   . Stroke [facility]) 2013   TIA  . Tubular adenoma of colon     Patient Active Problem List   Diagnosis Date Noted  . Chronic renal insufficiency, stage III (moderate) 04/17/2016  . Chronic anticoagulation 02/05/2016  . Chest pain   . Acute kidney injury superimposed on CKD ([place])   . Atrial flutter with rapid ventricular response ([place]) 01/31/2016  . Left carotid bruit 04/23/2015  . Leg pain 10/10/2013  . Chronic diastolic heart failure ([place]) 11/06/2009  . PULMONARY HYPERTENSION, SECONDARY  07/04/2009  . ARM NUMBNESS 07/04/2009  . Type 2 diabetes with stage 3 chronic kidney disease GFR 30-59 ([place]) 07/03/2009  . Hyperlipidemia 07/03/2009  . MITRAL REGURGITATION, MILD 07/03/2009  . Essential hypertension 07/03/2009  . GASTROPARESIS 07/03/2009  . DIVERTICULOSIS, COLON, WITH HEMORRHAGE 07/03/2009  . CONSTIPATION 07/03/2009  . IBS 07/03/2009  . ARTHRITIS 07/03/2009  . MIGRAINES, HX OF 07/03/2009  . EUSTACHIAN TUBE DYSFUNCTION 04/04/2007  . ALLERGIC RHINITIS 04/04/2007  . ASTHMA 04/04/2007  . ESOPHAGITIS, REFLUX 04/04/2007    Past Surgical History:  Procedure Laterality Date  . ANKLE FRACTURE SURGERY    . CARDIOVERSION N/A 02/04/2016   Procedure: CARDIOVERSION;  Surgeon: Sueanne Margarita, MD;  Location: [facility] ENDOSCOPY;  Service: Cardiovascular;  Laterality: N/A;  . CARDIOVERSION N/A 05/01/2016   Procedure: CARDIOVERSION;  Surgeon: Fay Records, MD;  Location: [facility];  Service: Cardiovascular;  Laterality: N/A;  . CHOLECYSTECTOMY    . TEE WITHOUT CARDIOVERSION N/A 02/04/2016   Procedure: TRANSESOPHAGEAL ECHOCARDIOGRAM (TEE);  Surgeon: Sueanne Margarita, MD;  Location: [facility] ENDOSCOPY;  Service: Cardiovascular;  Laterality: N/A;  . TUBAL LIGATION    . WRIST FRACTURE SURGERY      OB History    No data available       Home Medications    Prior to Admission medications   Medication Sig Start Date End Date Taking? Authorizing Provider  acetaminophen (TYLENOL) 325 MG tablet Take 2 tablets (650  mg total) by mouth every 4 (four) hours as needed for headache or mild pain. 02/05/16   Erlene Quan, PA-C  albuterol (PROVENTIL HFA;VENTOLIN HFA) 108 (90 BASE) MCG/ACT inhaler Inhale into the lungs every 6 (six) hours as needed for wheezing or shortness of breath.    Historical Provider, MD  ALPRAZolam Duanne Moron) 0.5 MG tablet Take 0.5 mg by mouth 2 (two) times daily as needed for anxiety.    Historical Provider, MD  amiodarone (PACERONE) 200 MG tablet Take 1 tablet (200 mg total) by mouth  2 (two) times daily. 04/17/16   Erlene Quan, PA-C  apixaban (ELIQUIS) 5 MG TABS tablet Take 1 tablet (5 mg total) by mouth 2 (two) times daily. 02/05/16   Erlene Quan, PA-C  atorvastatin (LIPITOR) 40 MG tablet Take 40 mg by mouth daily.    Historical Provider, MD  cetirizine (ZYRTEC) 10 MG tablet Take 10 mg by mouth daily.    Historical Provider, MD  clobetasol ointment (TEMOVATE) 8.46 % Apply 1 application topically 2 (two) times daily.    Historical Provider, MD  diltiazem (CARDIZEM CD) 180 MG 24 hr capsule Take 1 capsule (180 mg total) by mouth daily. 02/05/16   Erlene Quan, PA-C  EPINEPHrine (EPI-PEN) 0.3 mg/0.3 mL SOAJ injection Inject into the muscle once.    Historical Provider, MD  FLUOCINOLONE ACETONIDE BODY 0.01 % OIL Apply 1 application topically daily. 09/19/15   Historical Provider, MD  Fluticasone-Salmeterol (ADVAIR) 100-50 MCG/DOSE AEPB Inhale 1 puff into the lungs every 12 (twelve) hours.    Historical Provider, MD  insulin glargine (LANTUS) 100 UNIT/ML injection Inject 24 Units into the skin at bedtime.     Historical Provider, MD  levocetirizine (XYZAL) 5 MG tablet  04/14/16   Historical Provider, MD  LINZESS 145 MCG CAPS capsule TAKE 1 CAPSULE(145 MCG) BY MOUTH DAILY 02/06/16   Jerene Bears, MD  meclizine (ANTIVERT) 25 MG tablet Take 25 mg by mouth 3 (three) times daily as needed for dizziness.     Historical Provider, MD  mometasone (NASONEX) 50 MCG/ACT nasal spray Place 2 sprays into the nose daily.    Historical Provider, MD  montelukast (SINGULAIR) 10 MG tablet  04/14/16   Historical Provider, MD  olopatadine (PATANOL) 0.1 % ophthalmic solution Place 1 drop into both eyes 2 (two) times daily.     Historical Provider, MD  pantoprazole (PROTONIX) 40 MG tablet Take 1 tablet (40 mg total) by mouth daily. 02/05/16   Erlene Quan, PA-C  pregabalin (LYRICA) 75 MG capsule Take 75 mg by mouth 2 (two) times daily.    Historical Provider, MD  Propylene Glycol (SYSTANE BALANCE OP) Apply  1 drop to eye daily as needed (dryness).    Historical Provider, MD  traMADol (ULTRAM) 50 MG tablet Take 50 mg by mouth at bedtime as needed for severe pain.  09/18/15   Historical Provider, MD    Family History Family History  Problem Relation Age of Onset  . Diabetes Mother   . Diabetes Father   . Diabetes Sister   . Diabetes Brother   . Colon cancer Neg Hx     Social History Social History  Substance Use Topics  . Smoking status: Never Smoker  . Smokeless tobacco: Never Used  . Alcohol use No     Allergies   Codeine; Metformin; Metformin and related; Penicillins; Amoxicillin; Biaxin [clarithromycin]; Ciprofloxacin; Eggs or egg-derived products; and Penicillin g   Review of Systems Review of Systems  Constitutional: Negative for chills and fever.  HENT: Negative for congestion.   Eyes: Negative for photophobia and visual disturbance.  Respiratory: Negative for cough and shortness of breath.   Cardiovascular: Negative for chest pain, palpitations and leg swelling.  Gastrointestinal: Negative for abdominal pain, nausea and vomiting.  Genitourinary: Negative for dysuria.  Musculoskeletal: Negative for neck pain.  Skin: Negative for color change and rash.  Neurological: Positive for headaches. Negative for syncope, weakness and numbness.     Physical Exam Updated Vital Signs BP 127/57   Pulse (!) 51   Temp 97.3 F (36.3 C)   Resp 20   Ht 5' 1.5" (1.562 m)   Wt 71.7 kg   SpO2 98%   BMI 29.37 kg/m   Physical Exam  Constitutional: She is oriented to person, place, and time. She appears well-developed and well-nourished. No distress.  HENT:  Head: Normocephalic and atraumatic.  Mouth/Throat: Oropharynx is clear and moist.  Tenderness along the left aspect of face with no overlying skin changes.   Eyes: Conjunctivae and EOM are normal. Pupils are equal, round, and reactive to light. No scleral icterus.  No nystagmus   Neck: Normal range of motion. Neck supple.    Full active and passive ROM without pain.  No midline or paraspinal tenderness. No nuchal rigidity or meningeal signs.  Cardiovascular: Normal rate, regular rhythm, normal heart sounds and intact distal pulses.   Pulmonary/Chest: Effort normal and breath sounds normal. No respiratory distress. She has no wheezes. She has no rales.  Abdominal: Soft. Bowel sounds are normal. There is no tenderness. There is no rebound and no guarding.  Musculoskeletal: Normal range of motion.  Lymphadenopathy:    She has no cervical adenopathy.  Neurological: She is alert and oriented to person, place, and time. She has normal reflexes. No cranial nerve deficit. Coordination normal.  Mental Status: Alert, oriented, and thought content is appropriate. Speech is fluent without evidence of aphasia. Able to follow two-step commands without difficulty.  Cranial Nerves:  II - Peripheral visual fields grossly normal, pupils equal, round, reactive to light III, IV, VI - Bilateral EOM intact, no ptosis V - Facial light touch sensation intact  VII - Facial symmetry: smile, raised eyebrows ; Eyelids kept closed against resistance VIII - Hearing grossly normal bilaterally  IX, X - Uvula midline XI - Bilateral shoulder shrug equal and strong XII - Tongue extension midline Motor:  5/5 muscle strength of upper and lower extremities bilaterally including strong and equal grip strength and plantar/dorsiflexion.  Sensory:  Light touch sensory intact.   Skin: Skin is warm and dry. No rash noted. She is not diaphoretic.  Psychiatric: She has a normal mood and affect. Her behavior is normal. Judgment and thought content normal.  Nursing note and vitals reviewed.    ED Treatments / Results  Labs (all labs ordered are listed, but only abnormal results are displayed) Labs Reviewed  CBC - Abnormal; Notable for the following:       Result Value   MCH 25.5 (*)    RDW 16.3 (*)    All other components within normal limits   BASIC METABOLIC PANEL - Abnormal; Notable for the following:    Glucose, Bld 129 (*)    Creatinine, Ser 1.17 (*)    GFR calc non Af Amer 43 (*)    GFR calc Af Amer 50 (*)    All other components within normal limits  SEDIMENTATION RATE    EKG  EKG Interpretation None  Radiology No results found.  Procedures Procedures (including critical care time)  Medications Ordered in ED Medications  metoCLOPramide (REGLAN) injection 10 mg (10 mg Intravenous Given 05/17/16 2107)     Initial Impression / Assessment and Plan / ED Course  I have reviewed the triage vital signs and the nursing notes.  Pertinent labs & imaging results that were available during my care of the patient were reviewed by me and considered in my medical decision making (see chart for details).  Clinical Course    Kerry Barnes is a 82 y.o. female who presents to ED for persistent headache for the last day and a half.  Does not typically get headaches. No focal neuro deficits on exam.  Patient does have tenderness to the left aspect of face. No visual changes but temporal arteritis in differential - will obtain ESR. Will also obtain CT head.   Patient reevaluated. Resting comfortably in the room. Headache improved with Reglan. Going to CT now. ESR pending. CBC and BMP reassuring. Creatinine elevated but appears at baseline.  ESR is 5, temporal arteritis very unlikely.   CT head pending at shift change. Care assumed by attending, Dr. Laverta Baltimore. Case discussed, plan agreed upon. Will    Final Clinical Impressions(s) / ED Diagnoses   Final diagnoses:  None    New Prescriptions New Prescriptions   No medications on file     [facility], PA-C 05/17/16 2205    Margette Fast, MD 05/18/16 [phone]

## 2016-05-17 NOTE — Discharge Instructions (Signed)

## 2016-05-20 ENCOUNTER — Encounter (INDEPENDENT_AMBULATORY_CARE_PROVIDER_SITE_OTHER): Payer: PPO | Admitting: Ophthalmology

## 2016-05-20 DIAGNOSIS — H35033 Hypertensive retinopathy, bilateral: Secondary | ICD-10-CM

## 2016-05-20 DIAGNOSIS — E113291 Type 2 diabetes mellitus with mild nonproliferative diabetic retinopathy without macular edema, right eye: Secondary | ICD-10-CM

## 2016-05-20 DIAGNOSIS — H43813 Vitreous degeneration, bilateral: Secondary | ICD-10-CM

## 2016-05-20 DIAGNOSIS — E11311 Type 2 diabetes mellitus with unspecified diabetic retinopathy with macular edema: Secondary | ICD-10-CM

## 2016-05-20 DIAGNOSIS — E113212 Type 2 diabetes mellitus with mild nonproliferative diabetic retinopathy with macular edema, left eye: Secondary | ICD-10-CM | POA: Diagnosis not present

## 2016-05-20 DIAGNOSIS — H34812 Central retinal vein occlusion, left eye, with macular edema: Secondary | ICD-10-CM

## 2016-05-20 DIAGNOSIS — I10 Essential (primary) hypertension: Secondary | ICD-10-CM | POA: Diagnosis not present

## 2016-05-22 ENCOUNTER — Other Ambulatory Visit: Payer: Self-pay | Admitting: *Deleted

## 2016-05-22 NOTE — Telephone Encounter (Signed)
This encounter was created in error - please disregard.

## 2016-05-22 NOTE — Addendum Note (Signed)
Addended byValente David on: 05/22/2016 12:38 PM   Modules accepted: Miquel Dunn

## 2016-05-22 NOTE — Patient Outreach (Signed)
[facility]) Care Management   05/22/2016  Kerry Barnes 03-30-1934 [id number]   Call placed to member to follow up on concerns expressed last week.  She state that she was seen in the ED for headache and her blood pressure was elevated at that time.  She has since been restarted on Benicar and has not had not had any problems.  She denies any further concerns, will contact this care manager with questions if needed.  Will not reopen case.   [name], [place], MSN [place] [phone]

## 2016-05-22 NOTE — Patient Outreach (Deleted)
[facility]) Care Management  05/22/2016  Kerry Barnes 05/02/1934 [id number]   Call placed to member to follow up on concerns expressed last week.  She state that she was seen in the ED for headache and her blood pressure was elevated at that time.  She has since been restarted on Benicar and has not had not had any problems.  She denies any further concerns, will contact this care manager with questions if needed.  Will not reopen case.   [name], [place], MSN [place] [phone]

## 2016-05-27 ENCOUNTER — Telehealth: Payer: Self-pay | Admitting: Cardiology

## 2016-05-27 ENCOUNTER — Other Ambulatory Visit: Payer: Self-pay | Admitting: Pharmacist

## 2016-05-27 NOTE — Telephone Encounter (Signed)
Spoke to patient. She notes her BP is typically [id number] systolic when checked. She had an off-read this AM at 67/48. Inquired about symptoms - no fatigue, dizziness/lightheadedness on standing, etc. She denies any unusual symptoms or concerns. Notes no recent adjustment to medication, BP or otherwise.  I noted possibility that the reading might be due to a misread or problem w cuff.  She did not recheck the reading this AM to verify. Encouraged her to recheck, and to call if she has another unusual reading, if still low we may need to advise further action. Pt to call back if abnormal. Routed to provider for any further recommendations.

## 2016-05-27 NOTE — Telephone Encounter (Signed)
New message   Pt c/o BP issue: STAT if pt c/o blurred vision, one-sided weakness or slurred speech  1. What are your last 5 BP readings? 67/48   Hr 85  2. Are you having any other symptoms (ex. Dizziness, headache, blurred vision, passed out)? no 3. What is your BP issue? no

## 2016-05-27 NOTE — Telephone Encounter (Signed)
Line is busy when dialed. 

## 2016-05-27 NOTE — Patient Outreach (Signed)
[place] [facility]) Care Management  05/27/2016  Kerry Barnes 1934-04-19 [id number]  Received a voicemail from patient today stating she was trying to reach [place] because her blood pressure was low this morning.  Outreach phone call to patient, she verified HIPAA details, and she states she has since been in contact with her cardiology office, as indicated in this chart by the notes with cardiology.   Patient denies any medication concerns and denies needing to reach [facility] RN now.    Provided patient with phone number for [place], whom has previously worked with patient.   Plan:  Will not open a pharmacy case as patient denies pharmacy needs, she has been in contact with her cardiology office and she was provided with [facility] RN phone number.    [name], PharmD, [place] [phone]

## 2016-05-28 NOTE — Telephone Encounter (Signed)
Agree with plan. If she again has an abnormal B/P reading she should come in for an EKG and B/P check with an Therapist, sports.  Kerin Ransom PA-C 05/28/2016 8:36 AM

## 2016-06-01 ENCOUNTER — Other Ambulatory Visit: Payer: PPO

## 2016-06-02 ENCOUNTER — Other Ambulatory Visit: Payer: Self-pay

## 2016-06-02 ENCOUNTER — Telehealth: Payer: Self-pay | Admitting: Cardiovascular Disease

## 2016-06-02 DIAGNOSIS — I10 Essential (primary) hypertension: Secondary | ICD-10-CM | POA: Diagnosis not present

## 2016-06-02 DIAGNOSIS — I5032 Chronic diastolic (congestive) heart failure: Secondary | ICD-10-CM | POA: Diagnosis not present

## 2016-06-02 LAB — BASIC METABOLIC PANEL
BUN: 14 mg/dL (ref 7–25)
CO2: 28 mmol/L (ref 20–31)
Calcium: 9.3 mg/dL (ref 8.6–10.4)
Chloride: 104 mmol/L (ref 98–110)
Creat: 1.37 mg/dL — ABNORMAL HIGH (ref 0.60–0.88)
Glucose, Bld: 89 mg/dL (ref 65–99)
Potassium: 4.5 mmol/L (ref 3.5–5.3)
Sodium: 142 mmol/L (ref 135–146)

## 2016-06-02 NOTE — Telephone Encounter (Signed)
Left message to call back BMET order in [place], [place] aware

## 2016-06-03 ENCOUNTER — Other Ambulatory Visit: Payer: Self-pay | Admitting: Pharmacist

## 2016-06-03 NOTE — Patient Outreach (Signed)
[place] [facility]) Care Management  06/03/2016  Kerry Barnes 11-06-1933 [id number]  Received a voicemail from patient stating she doesn't understand her blood pressure readings.   Patient denies experiencing dizziness/lightheadedness and states when she re-checked her blood pressure it closer to normal for her.   Counseled patient the top number is the systolic blood pressure and lower number is diastolic blood pressure.  Counseled her both of these numbers make up a blood pressure reading.    Counseled patient if she gets a blood pressure reading that is abnormal, she should contact her cardiology office.  Also counseled patient to ensure her blood pressure cuff is not loose on her arm when she takes her reading.   Patient denies medication questions or further concerns.    Plan:  Patient aware she can call [facility] Pharmacist or RN if needed.    Will route this note to Kerin Ransom, [place] with cardiology to make aware patient is having problems with blood pressure readings at home as 05/28/16 telephone encounter from [place], [place] indicated patient may need to go to office for blood pressure check.    [name], PharmD, [place] [phone]

## 2016-06-04 NOTE — Progress Notes (Signed)
I have asked the office to arrange an appointment  Kerin Ransom PA-C 06/04/2016 4:47 PM

## 2016-06-12 ENCOUNTER — Ambulatory Visit: Payer: PPO | Admitting: *Deleted

## 2016-06-12 VITALS — BP 158/72 | HR 54

## 2016-06-12 DIAGNOSIS — Z0130 Encounter for examination of blood pressure without abnormal findings: Secondary | ICD-10-CM

## 2016-06-12 NOTE — Progress Notes (Signed)
Pt presents for nurse visit for BP check.  Patient reporting low BP readings at home with her home machine.  Pt reports readings of 37/27, 67/48.  Pt denies dizziness, lightheadedness, LOC, diaphoresis.  Reports only being fatigued x 1 month since d/c from hospital.  Pt reports taking all medication as prescribed, list reviewed with patient.  Pt a x4, NAD, denies current symptoms.  Advised patient that these readings may be due to machine error.  Advised if this happens in the future, wait approximately 10 mins and retake if asymptomatic.  If symptomatic, call our office or 911.    BP today: 158/72 P-54 (consistent with home readings)  Advised patient to bring BP cuff to next appt and continue to monitor BP and HR.  Pt agreed with plan and verbalized understanding.    Patient also due for f/u in March with Dr. Barnet Pall made for 3/27 @ 10am with Dr. Fletcher Anon at [place].  Pt aware.

## 2016-06-12 NOTE — Patient Instructions (Signed)
Advised if this happens in the future, wait approximately 10 mins and retake if asymptomatic.  If symptomatic, call our office or 911.    Advised patient to bring BP cuff to next appt and continue to monitor BP and HR.  Pt agreed with plan and verbalized understanding.    Patient also due for f/u in March with Dr. Barnet Pall made for 3/27 @ 10am with Dr. Fletcher Anon at [place].  Pt aware.

## 2016-07-01 ENCOUNTER — Encounter (INDEPENDENT_AMBULATORY_CARE_PROVIDER_SITE_OTHER): Payer: PPO | Admitting: Ophthalmology

## 2016-07-03 ENCOUNTER — Encounter (INDEPENDENT_AMBULATORY_CARE_PROVIDER_SITE_OTHER): Payer: PPO | Admitting: Ophthalmology

## 2016-07-03 DIAGNOSIS — I10 Essential (primary) hypertension: Secondary | ICD-10-CM

## 2016-07-03 DIAGNOSIS — E11311 Type 2 diabetes mellitus with unspecified diabetic retinopathy with macular edema: Secondary | ICD-10-CM

## 2016-07-03 DIAGNOSIS — H34812 Central retinal vein occlusion, left eye, with macular edema: Secondary | ICD-10-CM | POA: Diagnosis not present

## 2016-07-03 DIAGNOSIS — E113291 Type 2 diabetes mellitus with mild nonproliferative diabetic retinopathy without macular edema, right eye: Secondary | ICD-10-CM

## 2016-07-03 DIAGNOSIS — H43813 Vitreous degeneration, bilateral: Secondary | ICD-10-CM

## 2016-07-03 DIAGNOSIS — H35033 Hypertensive retinopathy, bilateral: Secondary | ICD-10-CM | POA: Diagnosis not present

## 2016-07-03 DIAGNOSIS — E113212 Type 2 diabetes mellitus with mild nonproliferative diabetic retinopathy with macular edema, left eye: Secondary | ICD-10-CM

## 2016-07-23 ENCOUNTER — Ambulatory Visit (INDEPENDENT_AMBULATORY_CARE_PROVIDER_SITE_OTHER): Payer: PPO | Admitting: Internal Medicine

## 2016-07-23 ENCOUNTER — Encounter: Payer: Self-pay | Admitting: Internal Medicine

## 2016-07-23 VITALS — BP 120/56 | HR 56 | Ht 61.42 in | Wt 154.1 lb

## 2016-07-23 DIAGNOSIS — K5909 Other constipation: Secondary | ICD-10-CM

## 2016-07-23 DIAGNOSIS — K219 Gastro-esophageal reflux disease without esophagitis: Secondary | ICD-10-CM | POA: Diagnosis not present

## 2016-07-23 DIAGNOSIS — R1310 Dysphagia, unspecified: Secondary | ICD-10-CM | POA: Diagnosis not present

## 2016-07-23 MED ORDER — LINACLOTIDE 290 MCG PO CAPS: 290.0000 ug | ORAL_CAPSULE | Freq: Every day | ORAL | 3 refills | Status: DC

## 2016-07-23 MED ORDER — LINACLOTIDE 290 MCG PO CAPS: 290.0000 ug | ORAL_CAPSULE | Freq: Every day | ORAL | 1 refills | Status: DC

## 2016-07-23 NOTE — Progress Notes (Signed)
Subjective:    Patient ID: Kerry Barnes, female    DOB: Oct 19, 1933, 83 y.o.   MRN: [id number]  HPI Kerry Barnes is an 83 year old female with a history of IBS with chronic constipation, GERD, adenomatous colon polyps is here for follow-up. She was last seen in March 2017. She's been taking Linzess 145 g daily for chronic constipation. This recently has not been helpful. She's been having a bowel movement about once per week. She feels constipated with lower abdominal pressure and bloating. No fevers or chills. No blood in her stool or melena.  She also feels that her reflux has been less controlled though she is rarely having heartburn. She is having throat clearing and some coughing. She also feels like food hangs up in her chest when she takes big bites. No trouble with liquids or swallowing pills. No trouble with odynophagia. She denies nausea or vomiting. Appetite has been slightly decreased.  She remains on Eliquis for A. fib. She fell yesterday because her legs get weak. She denies hitting her head or loss of consciousness.   Review of Systems   As per history of present illness, otherwise negative  Current Medications, Allergies, Past Medical History, Past Surgical History, Family History and Social History were reviewed in [employer] record.   Objective:   Physical Exam BP (!) 120/56 (BP Location: Left Arm, Patient Position: Sitting, Cuff Size: Normal)   Pulse (!) 56   Ht 5' 1.42" (1.56 m)   Wt 154 lb 2 oz (69.9 kg)   BMI 28.72 kg/m  Constitutional: Well-developed and well-nourished. No distress. HEENT: Normocephalic and atraumatic. Oropharynx is clear and moist. No oropharyngeal exudate. Conjunctivae are normal.  No scleral icterus. Neck: Neck supple. Trachea midline. Cardiovascular: Normal rate, regular rhythm and intact distal pulses.  Pulmonary/chest: Effort normal and breath sounds normal. No wheezing, rales or rhonchi. Abdominal:  Soft, obese, nontender, nondistended. Bowel sounds active throughout.  Extremities: no clubbing, cyanosis, or edema Neurological: Alert and oriented to person place and time. Skin: Skin is warm and dry. Psychiatric: Normal mood and affect. Behavior is normal.  CBC    Component Value Date/Time   WBC 6.6 05/17/2016 1945   RBC 4.75 05/17/2016 1945   HGB 12.1 05/17/2016 1945   HCT 39.0 05/17/2016 1945   PLT 265 05/17/2016 1945   MCV 82.1 05/17/2016 1945   MCH 25.5 (L) 05/17/2016 1945   MCHC 31.0 05/17/2016 1945   RDW 16.3 (H) 05/17/2016 1945   LYMPHSABS 3.3 09/10/2015 1424   MONOABS 0.7 09/10/2015 1424   EOSABS 0.8 (H) 09/10/2015 1424   BASOSABS 0.1 09/10/2015 1424   CMP     Component Value Date/Time   NA 142 06/02/2016 1100   K 4.5 06/02/2016 1100   CL 104 06/02/2016 1100   CO2 28 06/02/2016 1100   GLUCOSE 89 06/02/2016 1100   BUN 14 06/02/2016 1100   CREATININE 1.37 (H) 06/02/2016 1100   CALCIUM 9.3 06/02/2016 1100   PROT 7.4 04/29/2016 0806   ALBUMIN 4.2 04/29/2016 0806   AST 22 04/29/2016 0806   ALT 13 04/29/2016 0806   ALKPHOS 82 04/29/2016 0806   BILITOT 0.6 04/29/2016 0806   GFRNONAA 43 (L) 05/17/2016 1945   GFRAA 50 (L) 05/17/2016 1945       Assessment & Plan:  83 year old female with a history of IBS with chronic constipation, GERD, adenomatous colon polyps is here for follow-up.  1. IBS with chronic constipation  -- Linzess 145 g ineffective  for her to this point with infrequent bowel movements and constipation. Increase Linzess to 290 g daily. Call me if this is ineffective or should she develop diarrhea.   2. GERD with dysphagia  -- prior EGD with dilation across the GE junction. This seemed to be helpful. Question uncontrolled reflux and dysmotility. Barium swallow with tablet recommended. Continue pantoprazole 40 mg daily.follow-up barium esophagram and consider EGD based on these findings. If symptoms persist after improving constipation can consider  adding ranitidine 150-300 mg each evening.   25 minutes spent with the patient today. Greater than 50% was spent in counseling and coordination of care with the patient

## 2016-07-23 NOTE — Patient Instructions (Signed)
Continue pantoprazole.  We have sent the following medications to your pharmacy for you to pick up at your convenience: Linzess 290 mcg daily (dosage increase from 145 mcg daily)  You have been scheduled for a Barium Esophogram at [facility] Radiology (1st floor of the hospital) on Thursday, 07/30/16 at 1:30 pm. Please arrive 15 minutes prior to your appointment for registration. Make certain not to have anything to eat or drink 3 hours prior to your test. If you need to reschedule for any reason, please contact radiology at [phone] to do so. __________________________________________________________________ A barium swallow is an examination that concentrates on views of the esophagus. This tends to be a double contrast exam (barium and two liquids which, when combined, create a gas to distend the wall of the oesophagus) or single contrast (non-ionic iodine based). The study is usually tailored to your symptoms so a good history is essential. Attention is paid during the study to the form, structure and configuration of the esophagus, looking for functional disorders (such as aspiration, dysphagia, achalasia, motility and reflux) EXAMINATION You may be asked to change into a gown, depending on the type of swallow being performed. A radiologist and radiographer will perform the procedure. The radiologist will advise you of the type of contrast selected for your procedure and direct you during the exam. You will be asked to stand, sit or lie in several different positions and to hold a small amount of fluid in your mouth before being asked to swallow while the imaging is performed .In some instances you may be asked to swallow barium coated marshmallows to assess the motility of a solid food bolus. The exam can be recorded as a digital or video fluoroscopy procedure. POST PROCEDURE It will take 1-2 days for the barium to pass through your system. To facilitate this, it is important, unless otherwise  directed, to increase your fluids for the next 24-48hrs and to resume your normal diet.  This test typically takes about 30 minutes to perform. __________________________________________________________________________________ If you are age 81 or older, your body mass index should be between 23-30. Your Body mass index is 28.72 kg/m. If this is out of the aforementioned range listed, please consider follow up with your Primary Care Provider.  If you are age 71 or younger, your body mass index should be between 19-25. Your Body mass index is 28.72 kg/m. If this is out of the aformentioned range listed, please consider follow up with your Primary Care Provider.

## 2016-07-30 ENCOUNTER — Ambulatory Visit (HOSPITAL_COMMUNITY)
Admission: RE | Admit: 2016-07-30 | Discharge: 2016-07-30 | Disposition: A | Discharge status: Home / Self Care | Payer: PPO | Source: Ambulatory Visit | Attending: Internal Medicine | Admitting: Internal Medicine

## 2016-07-30 DIAGNOSIS — K219 Gastro-esophageal reflux disease without esophagitis: Secondary | ICD-10-CM | POA: Insufficient documentation

## 2016-07-30 DIAGNOSIS — R1310 Dysphagia, unspecified: Secondary | ICD-10-CM | POA: Diagnosis present

## 2016-07-30 DIAGNOSIS — K449 Diaphragmatic hernia without obstruction or gangrene: Secondary | ICD-10-CM | POA: Insufficient documentation

## 2016-07-30 DIAGNOSIS — K224 Dyskinesia of esophagus: Secondary | ICD-10-CM | POA: Insufficient documentation

## 2016-07-30 DIAGNOSIS — E7800 Pure hypercholesterolemia, unspecified: Secondary | ICD-10-CM | POA: Diagnosis not present

## 2016-07-30 DIAGNOSIS — E1142 Type 2 diabetes mellitus with diabetic polyneuropathy: Secondary | ICD-10-CM | POA: Diagnosis not present

## 2016-08-04 ENCOUNTER — Other Ambulatory Visit: Payer: Self-pay

## 2016-08-04 MED ORDER — PANTOPRAZOLE SODIUM 40 MG PO TBEC: 40.0000 mg | DELAYED_RELEASE_TABLET | Freq: Every day | ORAL | 3 refills | Status: DC

## 2016-08-04 MED ORDER — RANITIDINE HCL 150 MG PO CAPS: 150.0000 mg | ORAL_CAPSULE | Freq: Every evening | ORAL | 3 refills | Status: DC

## 2016-08-04 MED ORDER — PANTOPRAZOLE SODIUM 40 MG PO TBEC: 40.0000 mg | DELAYED_RELEASE_TABLET | Freq: Every day | ORAL | 11 refills | Status: DC

## 2016-08-06 DIAGNOSIS — E1142 Type 2 diabetes mellitus with diabetic polyneuropathy: Secondary | ICD-10-CM | POA: Diagnosis not present

## 2016-08-06 DIAGNOSIS — E7800 Pure hypercholesterolemia, unspecified: Secondary | ICD-10-CM | POA: Diagnosis not present

## 2016-08-06 DIAGNOSIS — G609 Hereditary and idiopathic neuropathy, unspecified: Secondary | ICD-10-CM | POA: Diagnosis not present

## 2016-08-06 DIAGNOSIS — I10 Essential (primary) hypertension: Secondary | ICD-10-CM | POA: Diagnosis not present

## 2016-08-07 DIAGNOSIS — B351 Tinea unguium: Secondary | ICD-10-CM | POA: Diagnosis not present

## 2016-08-07 DIAGNOSIS — M79672 Pain in left foot: Secondary | ICD-10-CM | POA: Diagnosis not present

## 2016-08-07 DIAGNOSIS — M79671 Pain in right foot: Secondary | ICD-10-CM | POA: Diagnosis not present

## 2016-08-12 ENCOUNTER — Encounter (INDEPENDENT_AMBULATORY_CARE_PROVIDER_SITE_OTHER): Payer: PPO | Admitting: Ophthalmology

## 2016-08-12 ENCOUNTER — Other Ambulatory Visit: Payer: Self-pay | Admitting: Pharmacist

## 2016-08-12 NOTE — Patient Outreach (Signed)
[facility]) Care Management  08/12/2016  Kerry Barnes 1934/04/24 [id number]  Received a voicemail from patient stating she had questions about her blood pressure readings.  Patient has previously called [place] Pharmacist regarding this and was directed to contact her cardiologist office.    Returned call to patient, made three attempts, each attempt went unanswered with no option to leave a voice message.   Plan:  Will make another outreach attempt to patient in the next week.    [name], PharmD, [place] [phone]

## 2016-08-14 ENCOUNTER — Other Ambulatory Visit: Payer: Self-pay | Admitting: Pharmacist

## 2016-08-14 NOTE — Patient Outreach (Signed)
[facility]) Care Management  08/14/2016  Kerry Barnes [birth year] [id number]  1024:  Unsuccessful attempt to reach patient, gentleman answered phone and stated patient would be back later.   1430:  Successful outreach to patient.  Patient reports she doesn't have any questions about her blood pressure and she denies pharmacy related questions/concerns.    Plan:  Pharmacy case not open.    [name], PharmD, [place] [phone]

## 2016-08-18 ENCOUNTER — Ambulatory Visit (INDEPENDENT_AMBULATORY_CARE_PROVIDER_SITE_OTHER): Payer: PPO | Admitting: Cardiovascular Disease

## 2016-08-18 VITALS — BP 130/56 | HR 52 | Ht 61.0 in | Wt 155.6 lb

## 2016-08-18 DIAGNOSIS — I5032 Chronic diastolic (congestive) heart failure: Secondary | ICD-10-CM

## 2016-08-18 DIAGNOSIS — I10 Essential (primary) hypertension: Secondary | ICD-10-CM | POA: Diagnosis not present

## 2016-08-18 DIAGNOSIS — I484 Atypical atrial flutter: Secondary | ICD-10-CM | POA: Diagnosis not present

## 2016-08-18 LAB — BASIC METABOLIC PANEL
BUN: 19 mg/dL (ref 7–25)
CO2: 25 mmol/L (ref 20–31)
Calcium: 9.1 mg/dL (ref 8.6–10.4)
Chloride: 107 mmol/L (ref 98–110)
Creat: 1.32 mg/dL — ABNORMAL HIGH (ref 0.60–0.88)
Glucose, Bld: 79 mg/dL (ref 65–99)
Potassium: 4.7 mmol/L (ref 3.5–5.3)
Sodium: 141 mmol/L (ref 135–146)

## 2016-08-18 LAB — HEPATIC FUNCTION PANEL
ALT: 51 U/L — ABNORMAL HIGH (ref 6–29)
AST: 50 U/L — ABNORMAL HIGH (ref 10–35)
Albumin: 3.9 g/dL (ref 3.6–5.1)
Alkaline Phosphatase: 90 U/L (ref 33–130)
Bilirubin, Direct: 0.1 mg/dL (ref <=0.2)
Indirect Bilirubin: 0.2 mg/dL (ref 0.2–1.2)
Total Bilirubin: 0.3 mg/dL (ref 0.2–1.2)
Total Protein: 6.6 g/dL (ref 6.1–8.1)

## 2016-08-18 LAB — CBC
HCT: 38.4 % (ref 35.0–45.0)
Hemoglobin: 12.3 g/dL (ref 11.7–15.5)
MCH: 26.6 pg — ABNORMAL LOW (ref 27.0–33.0)
MCHC: 32 g/dL (ref 32.0–36.0)
MCV: 83.1 fL (ref 80.0–100.0)
MPV: 10.5 fL (ref 7.5–12.5)
Platelets: 249 10*3/uL (ref 140–400)
RBC: 4.62 MIL/uL (ref 3.80–5.10)
RDW: 17.5 % — ABNORMAL HIGH (ref 11.0–15.0)
WBC: 5.9 10*3/uL (ref 3.8–10.8)

## 2016-08-18 LAB — TSH: TSH: 1.05 mIU/L

## 2016-08-18 MED ORDER — DILTIAZEM HCL ER COATED BEADS 120 MG PO CP24: 120.0000 mg | ORAL_CAPSULE | Freq: Every day | ORAL | 3 refills | Status: DC

## 2016-08-18 MED ORDER — AMIODARONE HCL 200 MG PO TABS: 200.0000 mg | ORAL_TABLET | Freq: Every day | ORAL | 3 refills | Status: DC

## 2016-08-18 NOTE — Progress Notes (Signed)
Cardiology Office Note   Date:  08/18/2016   ID:  Kerry Barnes, DOB [birth year], MRN [id number]  PCP:  Tula Nakayama  Cardiologist:   Kathlyn Sacramento, MD   Chief Complaint  Patient presents with  . Follow-up      History of Present Illness: Kerry Barnes is a 83 y.o. female who s here today for follow-up visit regarding chronic diastolic heart failure and  atypical atrial flutter. She has history of chronic diastolic heart failure (with moderate pulmonary hypertension),  Hypertension, mitral regurgitation, hypertension, hyperlipidemia, TIA previously on Plavix as well as diabetes mellitus.  She underwent a Lexiscan nuclear stress test in 09/2013 which was normal. ABI was done for atypical leg pain and was normal.  Carotid Doppler in November 2016 showed mild nonobstructive bilateral disease.  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  She had successful cardioversion in August of last year but developed recurrent atrial flutter in November. She was placed on amiodarone and underwent successful cardioversion. She was seen by Dr. Rayann Heman and was felt to be a poor candidate for ablation. She has been doing reasonably well and denies any chest pain or shortness of breath. No palpitations. Her biggest issue is fatigued and having no energy. She is no longer on Benicar for hypertension. Her blood pressure has been controlled at home but heart rate is typically in the 40s and 50s.   Past Medical History:  Diagnosis Date  . Allergy   . Anxiety   . Arthritis   . Asthma   . Atrial fibrillation ([place])   . Atrial flutter ([place])   . Blood transfusion without reported diagnosis   . Cataract   . Chronic diastolic CHF (congestive heart failure) ([place])   . Diabetes mellitus   . Fibromyalgia   . GERD  (gastroesophageal reflux disease)   . Hyperlipemia   . Hypertension   . IBS (irritable bowel syndrome)   . Stroke [facility]) 2013   TIA  . Tubular adenoma of colon     Past Surgical History:  Procedure Laterality Date  . ANKLE FRACTURE SURGERY    . CARDIOVERSION N/A 02/04/2016   Procedure: CARDIOVERSION;  Surgeon: Sueanne Margarita, MD;  Location: [facility] ENDOSCOPY;  Service: Cardiovascular;  Laterality: N/A;  . CARDIOVERSION N/A 05/01/2016   Procedure: CARDIOVERSION;  Surgeon: Fay Records, MD;  Location: [place];  Service: Cardiovascular;  Laterality: N/A;  . CHOLECYSTECTOMY    . TEE WITHOUT CARDIOVERSION N/A 02/04/2016   Procedure: TRANSESOPHAGEAL ECHOCARDIOGRAM (TEE);  Surgeon: Sueanne Margarita, MD;  Location: [facility] ENDOSCOPY;  Service: Cardiovascular;  Laterality: N/A;  . TUBAL LIGATION    . WRIST FRACTURE SURGERY       Current Outpatient Prescriptions  Medication Sig Dispense Refill  . acetaminophen (TYLENOL) 325 MG tablet Take 2 tablets (650 mg total) by mouth every 4 (four) hours as needed for headache or mild pain.    Marland Kitchen albuterol (PROVENTIL HFA;VENTOLIN HFA) 108 (90 BASE) MCG/ACT inhaler Inhale into the lungs every 6 (six) hours as needed for wheezing or shortness of breath.    . ALPRAZolam (XANAX) 0.5 MG tablet Take 0.5 mg by mouth 2 (two) times daily as needed for anxiety.    Marland Kitchen amiodarone (PACERONE) 200 MG tablet Take 1 tablet (200 mg total) by mouth 2 (two) times daily. 90 tablet 3  . apixaban (ELIQUIS)  5 MG TABS tablet Take 1 tablet (5 mg total) by mouth 2 (two) times daily. 180 tablet 3  . atorvastatin (LIPITOR) 40 MG tablet Take 40 mg by mouth daily.    . cetirizine (ZYRTEC) 10 MG tablet Take 10 mg by mouth daily.    . clobetasol ointment (TEMOVATE) [id number] % Apply 1 application topically 2 (two) times daily.    Marland Kitchen diltiazem (CARDIZEM CD) 180 MG 24 hr capsule Take 1 capsule (180 mg total) by mouth daily. 90 capsule 3  . EPINEPHrine (EPI-PEN) 0.3 mg/0.3 mL SOAJ injection Inject into the  muscle once.    Marland Kitchen FLUOCINOLONE ACETONIDE BODY 0.01 % OIL Apply 1 application topically daily.    . Fluticasone-Salmeterol (ADVAIR) 100-50 MCG/DOSE AEPB Inhale 1 puff into the lungs every 12 (twelve) hours.    . insulin glargine (LANTUS) 100 UNIT/ML injection Inject 24 Units into the skin at bedtime.     Marland Kitchen levocetirizine (XYZAL) 5 MG tablet     . linaclotide (LINZESS) 290 MCG CAPS capsule Take 1 capsule (290 mcg total) by mouth daily before breakfast. 90 capsule 1  . meclizine (ANTIVERT) 25 MG tablet Take 25 mg by mouth 3 (three) times daily as needed for dizziness.     . mometasone (NASONEX) 50 MCG/ACT nasal spray Place 2 sprays into the nose daily.    . montelukast (SINGULAIR) 10 MG tablet     . olopatadine (PATANOL) 0.1 % ophthalmic solution Place 1 drop into both eyes 2 (two) times daily.     . pantoprazole (PROTONIX) 40 MG tablet Take 1 tablet (40 mg total) by mouth daily. 90 tablet 3  . pantoprazole (PROTONIX) 40 MG tablet Take 1 tablet (40 mg total) by mouth daily. 30 tablet 11  . pregabalin (LYRICA) 75 MG capsule Take 75 mg by mouth 2 (two) times daily.    Marland Kitchen Propylene Glycol (SYSTANE BALANCE OP) Apply 1 drop to eye daily as needed (dryness).    . ranitidine (ZANTAC) 150 MG capsule Take 1 capsule (150 mg total) by mouth every evening. 30 capsule 3  . traMADol (ULTRAM) 50 MG tablet Take 50 mg by mouth at bedtime as needed for severe pain.      No current facility-administered medications for this visit.     Allergies:   Codeine; Metformin; Metformin and related; Penicillins; Amoxicillin; Biaxin [clarithromycin]; Ciprofloxacin; Eggs or egg-derived products; and Penicillin g    Social History:  The patient  reports that she has never smoked. She has never used smokeless tobacco. She reports that she does not drink alcohol or use drugs.   Family History:  The patient's family history includes Diabetes in her brother, father, mother, and sister.    ROS:  Please see the history of present  illness.   Otherwise, review of systems are positive for none.   All other systems are reviewed and negative.    PHYSICAL EXAM: VS:  BP (!) 130/56   Pulse (!) 52   Ht 5\' 1"  (1.549 m)   Wt 155 lb 9.6 oz (70.6 kg)   BMI 29.40 kg/m  , BMI Body mass index is 29.4 kg/m. GEN: Well nourished, well developed, in no acute distress  HEENT: normal  Neck: no JVD, carotid bruits, or masses Cardiac: RRR; no murmurs, rubs, or gallops,no edema  Respiratory:  clear to auscultation bilaterally, normal work of breathing GI: soft, nontender, nondistended, + BS MS: no deformity or atrophy  Skin: warm and dry, no rash Neuro:  Strength and sensation are  intact Psych: euthymic mood, full affect   EKG:  EKG is not ordered today.   Recent Labs: 01/31/2016: B Natriuretic Peptide 150.9; TSH 1.457 04/29/2016: ALT 13; Magnesium 1.9 05/17/2016: Hemoglobin 12.1; Platelets 265 06/02/2016: BUN 14; Creat 1.37; Potassium 4.5; Sodium 142    Lipid Panel    Component Value Date/Time   CHOL (H) 09/09/2010 0110    206        ATP III CLASSIFICATION:  <200     mg/dL   Desirable  200-239  mg/dL   Borderline High  >=240    mg/dL   High          TRIG 129 09/09/2010 0110   HDL 51 09/09/2010 0110   CHOLHDL 4.0 09/09/2010 0110   VLDL 26 09/09/2010 0110   LDLCALC (H) 09/09/2010 0110    129        Total Cholesterol/HDL:CHD Risk Coronary Heart Disease Risk Table                     Men   Women  1/2 Average Risk   3.4   3.3  Average Risk       5.0   4.4  2 X Average Risk   9.6   7.1  3 X Average Risk  23.4   11.0        Use the calculated Patient Ratio above and the CHD Risk Table to determine the patient's CHD Risk.        ATP III CLASSIFICATION (LDL):  <100     mg/dL   Optimal  100-129  mg/dL   Near or Above                    Optimal  130-159  mg/dL   Borderline  160-189  mg/dL   High  >190     mg/dL   Very High      Wt Readings from Last 3 Encounters:  08/18/16 155 lb 9.6 oz (70.6 kg)  07/23/16  154 lb 2 oz (69.9 kg)  05/17/16 158 lb (71.7 kg)        ASSESSMENT AND PLAN:  1. Paroxysmal atrial flutter:  She is maintaining in sinus rhythm with amiodarone. However, she is bradycardic and appears to be symptomatic from this. I decreased the amiodarone to 200 mg once daily and diltiazem to 120 mg once daily. I ordered routine labs giving that she is on anticoagulation and amiodarone.  2. Chronic diastolic heart failure:  She appears to be euvolemic without diuretics.  3. Essential hypertension: Blood pressure is borderline elevated. She is no longer on Benicar. Continue to monitor and resume if needed.    Disposition:   FU with PA in 3 months  Signed, Kathlyn Sacramento, MD  08/18/2016 10:25 AM    [place]

## 2016-08-18 NOTE — Patient Instructions (Addendum)
Medication Instructions:  Your physician has recommended you make the following change in your medication:  1. DECREASE Amiodarone to 200mg  take one tablet by mouth daily 2. DECREASE Diltiazem to 120mg  take one capsule by mouth daily  Labwork: Your physician recommends that you have lab work today: CBC, BMP, LIVER and TSH  Testing/Procedures: No new orders.   Follow-Up: Your physician recommends that you schedule a follow-up appointment in: 3 MONTHS with Kerin Ransom PA   Any Other Special Instructions Will Be Listed Below (If Applicable).     If you need a refill on your cardiac medications before your next appointment, please call your pharmacy.

## 2016-08-21 ENCOUNTER — Other Ambulatory Visit: Payer: Self-pay

## 2016-08-21 DIAGNOSIS — R748 Abnormal levels of other serum enzymes: Secondary | ICD-10-CM

## 2016-08-25 ENCOUNTER — Telehealth: Payer: Self-pay | Admitting: Cardiovascular Disease

## 2016-08-25 NOTE — Telephone Encounter (Signed)
Attempted to reach pt again but no answer at home number.

## 2016-08-25 NOTE — Telephone Encounter (Signed)
Patient is calling states that she has been having popping in her right ear. Patient believes that "popping" has something to do with medication prescribed by Dr.Arida. Please call, thanks.

## 2016-08-25 NOTE — Telephone Encounter (Signed)
Attempted to reach the pt but no answer at home number.  

## 2016-08-27 ENCOUNTER — Encounter (INDEPENDENT_AMBULATORY_CARE_PROVIDER_SITE_OTHER): Payer: PPO | Admitting: Ophthalmology

## 2016-09-03 DIAGNOSIS — M79671 Pain in right foot: Secondary | ICD-10-CM | POA: Diagnosis not present

## 2016-09-03 DIAGNOSIS — M79672 Pain in left foot: Secondary | ICD-10-CM | POA: Diagnosis not present

## 2016-09-03 DIAGNOSIS — B351 Tinea unguium: Secondary | ICD-10-CM | POA: Diagnosis not present

## 2016-09-03 NOTE — Telephone Encounter (Signed)
Left message for pt to contact the office if she is still having an issue with her ear. I will close this encounter.

## 2016-09-04 ENCOUNTER — Telehealth: Payer: Self-pay | Admitting: Cardiovascular Disease

## 2016-09-04 NOTE — Telephone Encounter (Signed)
New message    Pt is calling back to find out about the popping in her ear.

## 2016-09-04 NOTE — Telephone Encounter (Signed)
Returned call to patient-patient reports "clicking" in her ear for approximately 1 week.  Reports this occurs in the mornings and then goes away, but sometimes will reoccur during the day.  States that it is not a pounding noise and dose not sound like her heart beat, just a "clicking" noise.  Denies other symptoms other than nasal congestion.  Advised to call PCP to discuss.  Patient aware and verbalized understanding.

## 2016-09-07 ENCOUNTER — Other Ambulatory Visit: Payer: Self-pay | Admitting: Pharmacist

## 2016-09-07 NOTE — Patient Outreach (Signed)
[facility]) Care Management  09/07/2016  Kerry Barnes 1933-12-01 [id number]  Inbound call received from patient.  [facility] Pharmacist has worked with patient in the past.  Patient states she told her cardiologist office last week her ears are popping.  Reviewed note in chart from 09/04/16, and patient was directed by cardiologist office to contact her PCP---patient reports she hasn't yet because her PCP isn't an ear doctor.    She reports her previous ENT doctor doesn't accept her current insurance.  Patient was encouraged to contact her PCP office as she was instructed by cardiology office last week.  Patient was counseled to contact the concierge number on her insurance card to help locate an ENT doctor that is in the insurance network.  Patient reports she doesn't like seeing a PA at her PCP office.   Reminded patient [facility] Pharmacist does not work for her PCP and if she wishes to see MD she could discuss that with her PCP or contact her insurance to locate a new PCP.    Patient denied any pharmacy concerns during phone call.   Patient was reminded to follow cardiology office instructions and contact her PCP.    Plan:  No pharmacy case opened as patient denied pharmacy needs during call.     [name], PharmD, [place] [phone]

## 2016-09-08 ENCOUNTER — Ambulatory Visit: Payer: PPO | Admitting: Cardiovascular Disease

## 2016-09-23 ENCOUNTER — Encounter (INDEPENDENT_AMBULATORY_CARE_PROVIDER_SITE_OTHER): Payer: PPO | Admitting: Ophthalmology

## 2016-09-23 DIAGNOSIS — H43813 Vitreous degeneration, bilateral: Secondary | ICD-10-CM

## 2016-09-23 DIAGNOSIS — E113391 Type 2 diabetes mellitus with moderate nonproliferative diabetic retinopathy without macular edema, right eye: Secondary | ICD-10-CM | POA: Diagnosis not present

## 2016-09-23 DIAGNOSIS — H34812 Central retinal vein occlusion, left eye, with macular edema: Secondary | ICD-10-CM | POA: Diagnosis not present

## 2016-09-23 DIAGNOSIS — I10 Essential (primary) hypertension: Secondary | ICD-10-CM

## 2016-09-23 DIAGNOSIS — E113312 Type 2 diabetes mellitus with moderate nonproliferative diabetic retinopathy with macular edema, left eye: Secondary | ICD-10-CM

## 2016-09-23 DIAGNOSIS — H35033 Hypertensive retinopathy, bilateral: Secondary | ICD-10-CM

## 2016-09-23 DIAGNOSIS — E11311 Type 2 diabetes mellitus with unspecified diabetic retinopathy with macular edema: Secondary | ICD-10-CM

## 2016-10-01 ENCOUNTER — Other Ambulatory Visit: Payer: Self-pay | Admitting: Family Medicine

## 2016-10-01 DIAGNOSIS — Z1231 Encounter for screening mammogram for malignant neoplasm of breast: Secondary | ICD-10-CM

## 2016-10-01 DIAGNOSIS — B369 Superficial mycosis, unspecified: Secondary | ICD-10-CM | POA: Diagnosis not present

## 2016-10-01 DIAGNOSIS — E7800 Pure hypercholesterolemia, unspecified: Secondary | ICD-10-CM | POA: Diagnosis not present

## 2016-10-01 DIAGNOSIS — M810 Age-related osteoporosis without current pathological fracture: Secondary | ICD-10-CM

## 2016-10-01 DIAGNOSIS — F419 Anxiety disorder, unspecified: Secondary | ICD-10-CM | POA: Diagnosis not present

## 2016-10-19 ENCOUNTER — Ambulatory Visit
Admission: RE | Admit: 2016-10-19 | Discharge: 2016-10-19 | Disposition: A | Discharge status: Home / Self Care | Payer: PPO | Source: Ambulatory Visit | Attending: Family Medicine | Admitting: Family Medicine

## 2016-10-19 DIAGNOSIS — Z1231 Encounter for screening mammogram for malignant neoplasm of breast: Secondary | ICD-10-CM | POA: Diagnosis not present

## 2016-10-19 DIAGNOSIS — M810 Age-related osteoporosis without current pathological fracture: Secondary | ICD-10-CM

## 2016-10-21 ENCOUNTER — Encounter (INDEPENDENT_AMBULATORY_CARE_PROVIDER_SITE_OTHER): Payer: PPO | Admitting: Ophthalmology

## 2016-10-21 DIAGNOSIS — H34812 Central retinal vein occlusion, left eye, with macular edema: Secondary | ICD-10-CM | POA: Diagnosis not present

## 2016-10-21 DIAGNOSIS — E11311 Type 2 diabetes mellitus with unspecified diabetic retinopathy with macular edema: Secondary | ICD-10-CM

## 2016-10-21 DIAGNOSIS — E113312 Type 2 diabetes mellitus with moderate nonproliferative diabetic retinopathy with macular edema, left eye: Secondary | ICD-10-CM

## 2016-10-21 DIAGNOSIS — I10 Essential (primary) hypertension: Secondary | ICD-10-CM

## 2016-10-21 DIAGNOSIS — H43813 Vitreous degeneration, bilateral: Secondary | ICD-10-CM | POA: Diagnosis not present

## 2016-10-21 DIAGNOSIS — E113391 Type 2 diabetes mellitus with moderate nonproliferative diabetic retinopathy without macular edema, right eye: Secondary | ICD-10-CM | POA: Diagnosis not present

## 2016-10-21 DIAGNOSIS — H35033 Hypertensive retinopathy, bilateral: Secondary | ICD-10-CM | POA: Diagnosis not present

## 2016-10-26 ENCOUNTER — Encounter: Payer: PPO | Admitting: Podiatry

## 2016-10-26 ENCOUNTER — Inpatient Hospital Stay
Admission: RE | Admit: 2016-10-26 | Discharge: 2016-10-26 | Disposition: A | Discharge status: Home / Self Care | Payer: PPO | Source: Ambulatory Visit | Attending: Family Medicine | Admitting: Family Medicine

## 2016-11-01 NOTE — Progress Notes (Signed)
This encounter was created in error - please disregard.

## 2016-11-10 DIAGNOSIS — E7800 Pure hypercholesterolemia, unspecified: Secondary | ICD-10-CM | POA: Diagnosis not present

## 2016-11-10 DIAGNOSIS — I10 Essential (primary) hypertension: Secondary | ICD-10-CM | POA: Diagnosis not present

## 2016-11-10 DIAGNOSIS — Z8673 Personal history of transient ischemic attack (TIA), and cerebral infarction without residual deficits: Secondary | ICD-10-CM | POA: Diagnosis not present

## 2016-11-10 DIAGNOSIS — I4892 Unspecified atrial flutter: Secondary | ICD-10-CM | POA: Diagnosis not present

## 2016-11-11 ENCOUNTER — Inpatient Hospital Stay: Admission: RE | Admit: 2016-11-11 | Payer: PPO | Source: Ambulatory Visit

## 2016-11-12 ENCOUNTER — Ambulatory Visit: Payer: PPO | Admitting: Internal Medicine

## 2016-11-12 ENCOUNTER — Telehealth: Payer: Self-pay | Admitting: *Deleted

## 2016-11-12 ENCOUNTER — Ambulatory Visit
Admission: RE | Admit: 2016-11-12 | Discharge: 2016-11-12 | Disposition: A | Discharge status: Home / Self Care | Payer: PPO | Source: Ambulatory Visit | Attending: Family Medicine | Admitting: Family Medicine

## 2016-11-12 DIAGNOSIS — I4892 Unspecified atrial flutter: Secondary | ICD-10-CM | POA: Diagnosis not present

## 2016-11-12 DIAGNOSIS — I10 Essential (primary) hypertension: Secondary | ICD-10-CM | POA: Diagnosis not present

## 2016-11-12 DIAGNOSIS — M8588 Other specified disorders of bone density and structure, other site: Secondary | ICD-10-CM | POA: Diagnosis not present

## 2016-11-12 NOTE — Telephone Encounter (Signed)
No show letter mailed to patient. 

## 2016-11-13 ENCOUNTER — Ambulatory Visit: Payer: PPO | Admitting: Podiatry

## 2016-11-16 ENCOUNTER — Other Ambulatory Visit: Payer: Self-pay | Admitting: Internal Medicine

## 2016-11-16 ENCOUNTER — Ambulatory Visit: Payer: PPO | Admitting: Cardiology

## 2016-11-18 ENCOUNTER — Ambulatory Visit: Payer: PPO | Admitting: Nurse Practitioner

## 2016-11-25 ENCOUNTER — Encounter (INDEPENDENT_AMBULATORY_CARE_PROVIDER_SITE_OTHER): Payer: PPO | Admitting: Ophthalmology

## 2016-11-25 DIAGNOSIS — H34812 Central retinal vein occlusion, left eye, with macular edema: Secondary | ICD-10-CM | POA: Diagnosis not present

## 2016-11-25 DIAGNOSIS — E113293 Type 2 diabetes mellitus with mild nonproliferative diabetic retinopathy without macular edema, bilateral: Secondary | ICD-10-CM

## 2016-11-25 DIAGNOSIS — I10 Essential (primary) hypertension: Secondary | ICD-10-CM | POA: Diagnosis not present

## 2016-11-25 DIAGNOSIS — H43813 Vitreous degeneration, bilateral: Secondary | ICD-10-CM | POA: Diagnosis not present

## 2016-11-25 DIAGNOSIS — H35033 Hypertensive retinopathy, bilateral: Secondary | ICD-10-CM | POA: Diagnosis not present

## 2016-11-25 DIAGNOSIS — E11311 Type 2 diabetes mellitus with unspecified diabetic retinopathy with macular edema: Secondary | ICD-10-CM

## 2016-11-27 ENCOUNTER — Ambulatory Visit (INDEPENDENT_AMBULATORY_CARE_PROVIDER_SITE_OTHER): Payer: PPO | Admitting: Podiatry

## 2016-11-27 ENCOUNTER — Ambulatory Visit (INDEPENDENT_AMBULATORY_CARE_PROVIDER_SITE_OTHER): Payer: PPO

## 2016-11-27 ENCOUNTER — Encounter: Payer: Self-pay | Admitting: Podiatry

## 2016-11-27 DIAGNOSIS — R52 Pain, unspecified: Secondary | ICD-10-CM

## 2016-11-27 DIAGNOSIS — L853 Xerosis cutis: Secondary | ICD-10-CM | POA: Diagnosis not present

## 2016-11-27 DIAGNOSIS — M79674 Pain in right toe(s): Secondary | ICD-10-CM | POA: Diagnosis not present

## 2016-11-27 DIAGNOSIS — E119 Type 2 diabetes mellitus without complications: Secondary | ICD-10-CM | POA: Diagnosis not present

## 2016-11-27 DIAGNOSIS — M79675 Pain in left toe(s): Secondary | ICD-10-CM

## 2016-11-27 DIAGNOSIS — B351 Tinea unguium: Secondary | ICD-10-CM | POA: Diagnosis not present

## 2016-11-27 NOTE — Progress Notes (Signed)
   Subjective:    Patient ID: Kerry Barnes, female    DOB: Jul 29, 1933, 83 y.o.   MRN: [id number]  HPI   83 year old female presents the office if concerns of cracking, peeling on the bottom of both first heels which is been ongoing for several months. She did see another podiatrist for this and she was given ketoconazole  And lamsil to apply which she states this has not been helping. She denies any open sores and she denies any drainage or swelling to the area. She also states her nails are thick, painful, elongated she cannot trim them herself. She denies any surrounding redness or drainage the toenail site. She is diabetic and her last blood sugar was 78 this morning that she checks. She has no other concerns today. She has no other complaints.    Review of Systems  All other systems reviewed and are negative.      Objective:   Physical Exam General: AAO x3, NAD  Dermatological:Nails are hypertrophic, dystrophic, brittle, discolored, elongated 10. No surrounding redness or drainage. Tenderness nails 1-5 bilaterally. Bilateral plantar heels are dry with pealing skin present. Upon debridement there is no overlying ulceration, drainage or any signs of infection. No open lesions or pre-ulcerative lesions are identified today.  Vascular: Dorsalis Pedis artery and Posterior Tibial artery pedal pulses are 1/4 bilateral with immedate capillary fill time.  There is no pain with calf compression, swelling, warmth, erythema.   Neruologic: Grossly intact via light touch bilateral. Protective threshold with Semmes Wienstein monofilament intact to all pedal sites bilateral.   Musculoskeletal: No gross boney pedal deformities bilateral. No pain, crepitus, or limitation noted with foot and ankle range of motion bilateral. Muscular strength 5/5 in all groups tested bilateral.  Gait: Unassisted, Nonantalgic.     Assessment & Plan:  83 year old female with bilateral dry, xerotic heels;  symptomatic onychomycosis -Treatment options discussed including all alternatives, risks, and complications -X-rays were obtained and reviewed with the patient. No evidence of osteomyelitis, soft tissue emphysema, acute fracture. -Etiology of symptoms were discussed -Shepherd debrided some dry skin the bilateral plantar heels. I recommended moisturizer to the area daily. Discussed when she is in bed not plantar heels touch the bed or any service. Recommended offloading the heels with pillows to avoid any pressure. Monitor for any skin breakdown. -Nails debrided 10 without complications or bleeding. -Daily foot inspection -Follow-up in 9 weeks or sooner if any problems arise. In the meantime, encouraged to call the office with any questions, concerns, change in symptoms.   Celesta Gentile, DPM

## 2016-12-01 ENCOUNTER — Ambulatory Visit (INDEPENDENT_AMBULATORY_CARE_PROVIDER_SITE_OTHER): Payer: PPO | Admitting: Nurse Practitioner

## 2016-12-01 ENCOUNTER — Encounter: Payer: Self-pay | Admitting: Nurse Practitioner

## 2016-12-01 VITALS — BP 138/62 | HR 56 | Ht 61.0 in | Wt 152.4 lb

## 2016-12-01 DIAGNOSIS — K5909 Other constipation: Secondary | ICD-10-CM

## 2016-12-01 DIAGNOSIS — K589 Irritable bowel syndrome without diarrhea: Secondary | ICD-10-CM | POA: Diagnosis not present

## 2016-12-01 DIAGNOSIS — K219 Gastro-esophageal reflux disease without esophagitis: Secondary | ICD-10-CM | POA: Diagnosis not present

## 2016-12-01 NOTE — Progress Notes (Addendum)
     HPI: Patient is an 83 year old female known to Dr. Hilarie Barnes. She is followed for IBS / chronic constipation, GERD, and history of adenomatous colon polyps. At the time of her last visit in February patient felt like Linzess had lost efficacy, down to only one bowel movement a week. She was bloated with lower abdominal discomfort. Additionally her reflux was suboptimally controlled at the time and she was having problems with dysphagia. Linzess dose was increased. An evening dose of Zantac was added to GERD regimen.  She had a barium swallow which revealed mild to moderate esophageal dysmotility with poor motility in the distal esophagus. The barium tablet passed into the stomach without delay. Currently her GERD symptoms are controlled  [name] says she just feels terrible. She has a normal bowel movement about 3 times a week. Other times stools are hard and difficult to pass requiring her to strain. She drinks 5 bottles of water a day, incorporates fiber into her diet. She gets bloated, worried there may be a tumor in her abdomen. Discusses the "cancer" removed at time of last colonoscopy in 2016. She has no abdominal pain. No blood in her stool. Latest labs from March 2018 shows renal function about baseline, mild transaminitis   Past Medical History:  Diagnosis Date  . Allergy   . Anxiety   . Arthritis   . Asthma   . Atrial fibrillation ([place])   . Atrial flutter ([place])   . Blood transfusion without reported diagnosis   . Cataract   . Chronic diastolic CHF (congestive heart failure) ([place])   . Diabetes mellitus   . Fibromyalgia   . GERD (gastroesophageal reflux disease)   . Hyperlipemia   . Hypertension   . IBS (irritable bowel syndrome)   . Stroke [facility]) 2013   TIA  . Tubular adenoma of colon     Patient's surgical history, family medical history, social history, medications and allergies were all reviewed in Epic    Physical Exam: BP 138/62   Pulse (!) 56   Ht 5\' 1"  (1.549  m) Comment: w/o shoes  Wt 152 lb 6 oz (69.1 kg)   BMI 28.79 kg/m   GENERAL: overweight black female in NAD PSYCH: :flat affect. Cooperative, normal affect EENT:  conjunctiva pink, mucous membranes moist, neck supple without masses CARDIAC:  RRR, no murmur heard, no peripheral edema PULM: Normal respiratory effort, a few bibasilar crackles  ABDOMEN:  soft, nontender, nondistended, no obvious masses,  normal bowel sounds SKIN:  turgor, no lesions seen NEURO: Alert and oriented x 3, no focal neurologic deficits   ASSESSMENT and PLAN:  51. 83 year old female with chronic constipation / IBS. Having normal BM 3 times a week on Linzess but in between times her stools are hard, requiring her to strain.  -continue to drink at least 5 bottles of water daily -continue high-fiber diet -Continue Linzess -Avoid straining. Trial of dulcolax supp as needed   2. GERD. Her symptoms have improved with addition of Zantac in the evening.  -continue anti-reflux measures -continue protonix q am and zantac in evening  3. Esophageal dysmotility,  Stable.    Tye Savoy , NP 12/01/2016, 9:05 AM  Addendum: Reviewed and agree with management. Pyrtle, Lajuan Lines, MD

## 2016-12-01 NOTE — Patient Instructions (Addendum)
If you are age 81 or older, your body mass index should be between 23-30. Your Body mass index is 28.79 kg/m. If this is out of the aforementioned range listed, please consider follow up with your Primary Care Provider.  If you are age 5 or younger, your body mass index should be between 19-25. Your Body mass index is 28.79 kg/m. If this is out of the aformentioned range listed, please consider follow up with your Primary Care Provider.   Purchase Dulcolax suppositories over-the-counter use as needed.  Thank you for choosing me and [facility].   Tye Savoy, NP

## 2016-12-08 DIAGNOSIS — K219 Gastro-esophageal reflux disease without esophagitis: Secondary | ICD-10-CM | POA: Diagnosis not present

## 2016-12-08 DIAGNOSIS — K5901 Slow transit constipation: Secondary | ICD-10-CM | POA: Diagnosis not present

## 2016-12-08 DIAGNOSIS — E1122 Type 2 diabetes mellitus with diabetic chronic kidney disease: Secondary | ICD-10-CM | POA: Diagnosis not present

## 2016-12-08 DIAGNOSIS — N183 Chronic kidney disease, stage 3 (moderate): Secondary | ICD-10-CM | POA: Diagnosis not present

## 2016-12-08 DIAGNOSIS — Z794 Long term (current) use of insulin: Secondary | ICD-10-CM | POA: Diagnosis not present

## 2016-12-08 DIAGNOSIS — K224 Dyskinesia of esophagus: Secondary | ICD-10-CM | POA: Diagnosis not present

## 2016-12-21 ENCOUNTER — Other Ambulatory Visit: Payer: Self-pay | Admitting: Internal Medicine

## 2016-12-30 DIAGNOSIS — I872 Venous insufficiency (chronic) (peripheral): Secondary | ICD-10-CM | POA: Diagnosis not present

## 2016-12-30 DIAGNOSIS — E1140 Type 2 diabetes mellitus with diabetic neuropathy, unspecified: Secondary | ICD-10-CM | POA: Diagnosis not present

## 2017-01-06 ENCOUNTER — Encounter (INDEPENDENT_AMBULATORY_CARE_PROVIDER_SITE_OTHER): Payer: PPO | Admitting: Ophthalmology

## 2017-01-06 DIAGNOSIS — H43813 Vitreous degeneration, bilateral: Secondary | ICD-10-CM | POA: Diagnosis not present

## 2017-01-06 DIAGNOSIS — E113291 Type 2 diabetes mellitus with mild nonproliferative diabetic retinopathy without macular edema, right eye: Secondary | ICD-10-CM | POA: Diagnosis not present

## 2017-01-06 DIAGNOSIS — E11311 Type 2 diabetes mellitus with unspecified diabetic retinopathy with macular edema: Secondary | ICD-10-CM

## 2017-01-06 DIAGNOSIS — E113312 Type 2 diabetes mellitus with moderate nonproliferative diabetic retinopathy with macular edema, left eye: Secondary | ICD-10-CM

## 2017-01-06 DIAGNOSIS — H34812 Central retinal vein occlusion, left eye, with macular edema: Secondary | ICD-10-CM

## 2017-01-06 DIAGNOSIS — H35033 Hypertensive retinopathy, bilateral: Secondary | ICD-10-CM | POA: Diagnosis not present

## 2017-01-06 DIAGNOSIS — I10 Essential (primary) hypertension: Secondary | ICD-10-CM

## 2017-01-27 DIAGNOSIS — G609 Hereditary and idiopathic neuropathy, unspecified: Secondary | ICD-10-CM | POA: Diagnosis not present

## 2017-01-27 DIAGNOSIS — E1142 Type 2 diabetes mellitus with diabetic polyneuropathy: Secondary | ICD-10-CM | POA: Diagnosis not present

## 2017-01-27 DIAGNOSIS — E7800 Pure hypercholesterolemia, unspecified: Secondary | ICD-10-CM | POA: Diagnosis not present

## 2017-01-28 DIAGNOSIS — E119 Type 2 diabetes mellitus without complications: Secondary | ICD-10-CM | POA: Diagnosis not present

## 2017-01-28 DIAGNOSIS — Z794 Long term (current) use of insulin: Secondary | ICD-10-CM | POA: Diagnosis not present

## 2017-01-29 ENCOUNTER — Ambulatory Visit: Payer: PPO | Admitting: Podiatry

## 2017-02-03 DIAGNOSIS — E7800 Pure hypercholesterolemia, unspecified: Secondary | ICD-10-CM | POA: Diagnosis not present

## 2017-02-03 DIAGNOSIS — I10 Essential (primary) hypertension: Secondary | ICD-10-CM | POA: Diagnosis not present

## 2017-02-03 DIAGNOSIS — E1142 Type 2 diabetes mellitus with diabetic polyneuropathy: Secondary | ICD-10-CM | POA: Diagnosis not present

## 2017-02-03 DIAGNOSIS — G609 Hereditary and idiopathic neuropathy, unspecified: Secondary | ICD-10-CM | POA: Diagnosis not present

## 2017-02-04 ENCOUNTER — Encounter: Payer: Self-pay | Admitting: Pharmacist Clinician (PhC)/ Clinical Pharmacy Specialist

## 2017-02-04 ENCOUNTER — Ambulatory Visit (INDEPENDENT_AMBULATORY_CARE_PROVIDER_SITE_OTHER): Payer: PPO | Admitting: Internal Medicine

## 2017-02-04 ENCOUNTER — Encounter: Payer: Self-pay | Admitting: Internal Medicine

## 2017-02-04 ENCOUNTER — Telehealth: Payer: Self-pay | Admitting: *Deleted

## 2017-02-04 VITALS — BP 154/62 | HR 47 | Ht 61.0 in | Wt 151.4 lb

## 2017-02-04 DIAGNOSIS — K224 Dyskinesia of esophagus: Secondary | ICD-10-CM | POA: Diagnosis not present

## 2017-02-04 DIAGNOSIS — K5909 Other constipation: Secondary | ICD-10-CM

## 2017-02-04 DIAGNOSIS — K219 Gastro-esophageal reflux disease without esophagitis: Secondary | ICD-10-CM | POA: Diagnosis not present

## 2017-02-04 DIAGNOSIS — R1310 Dysphagia, unspecified: Secondary | ICD-10-CM

## 2017-02-04 DIAGNOSIS — R1319 Other dysphagia: Secondary | ICD-10-CM

## 2017-02-04 NOTE — Telephone Encounter (Signed)
   Kerry Barnes [birth year] [id number]  Dear Cardiology Team (Dr Fletcher Anon):  We have scheduled the above named patient for a(n) endoscopy procedure. Our records show that (s)he is on anticoagulation therapy.  Please advise as to whether the patient may come off their therapy of Eliquis 2 days prior to their procedure which is scheduled for 03/16/17.  Please route your response to Dixon Boos, CMA.  Sincerely,  [facility]

## 2017-02-04 NOTE — Patient Instructions (Signed)
You have been scheduled for an endoscopy. Please follow written instructions given to you at your visit today. If you use inhalers (even only as needed), please bring them with you on the day of your procedure. Your physician has requested that you go to www.startemmi.com and enter the access code given to you at your visit today. This web site gives a general overview about your procedure. However, you should still follow specific instructions given to you by our office regarding your preparation for the procedure.  You will be contacted by our office prior to your procedure for directions on holding your Eliquis.  If you do not hear from our office 1 week prior to your scheduled procedure, please call [phone] to discuss.   Continue pantoprazole every morning and ranitidine every evening.  Continue Linzess.  Please purchase the following medications over the counter and take as directed: Miralax 17 grams (1 capful) dissolved in at least 8 ounces water/juice once daily  If you are age 51 or older, your body mass index should be between 23-30. Your Body mass index is 28.61 kg/m. If this is out of the aforementioned range listed, please consider follow up with your Primary Care Provider.  If you are age 77 or younger, your body mass index should be between 19-25. Your Body mass index is 28.61 kg/m. If this is out of the aformentioned range listed, please consider follow up with your Primary Care Provider.

## 2017-02-04 NOTE — Progress Notes (Signed)
Subjective:    Patient ID: Kerry Barnes, female    DOB: 07/02/1933, 83 y.o.   MRN: [id number]  HPI Kerry Barnes is an 83 year old female with a past medical history of IBS with chronic constipation, GERD and history of colon polyps is here for follow-up. I last saw her in February 2018 but she was seen in June 2018 by Tye Savoy, NP to discuss her chronic constipation. Her GERD symptoms had improved at that time.  Today her biggest complaint is esophageal dysphagia. She reports feeling like her "throat is not big enough when I swallow". She has an issue swallowing meats as well as pills. Liquids are no trouble. She reports feeling like food gets hung her sternal notch. She had a previous dilatation of her esophagus in May 2015 and she reports this helped her swallowing at that time. Reviewing that report the dilation was empiric as no stricture was seen. Dilation was performed in the lower esophagus across the GE junction to 18 mm with balloon. To evaluate this symptom I ordered a barium esophagram in February. This showed mild to moderate esophageal dysmotility a small hiatal hernia with mild reflux and no stricture.  Constipation continues to be about the same for her. She'll go 3-4 days normally and then skip 2 or 3 days at which time she has trouble passing stool and initiating bowel movement. This will last 2 or 3 days. No blood in her stool or melena. She's had some crampy left-sided abdominal discomfort which comes and goes. Worse with constipation. She is using Linzess 290 g daily.  Reflux is been well controlled lately and has had much less heartburn on her current regimen of pantoprazole 40 mg in the morning and ranitidine 150 mg in the evening.   Review of Systems As per history of present illness, otherwise negative  Current Medications, Allergies, Past Medical History, Past Surgical History, Family History and Social History were reviewed in [employer] record.     Objective:   Physical Exam BP (!) 154/62   Pulse (!) 47   Ht 5\' 1"  (1.549 m)   Wt 151 lb 6.4 oz (68.7 kg)   BMI 28.61 kg/m  Constitutional: Well-developed and well-nourished. No distress. HEENT: Normocephalic and atraumatic.  Conjunctivae are normal.  No scleral icterus. Neck: Neck supple. Trachea midline. Cardiovascular: Normal rate, regular rhythm and intact distal pulses. Pulmonary/chest: Effort normal and breath sounds normal. No wheezing, rales or rhonchi. Abdominal: Soft, nontender, nondistended. Bowel sounds active throughout. There are no masses palpable. Extremities: no clubbing, cyanosis, or edema Neurological: Alert and oriented to person place and time. Skin: Skin is warm and dry. Psychiatric: Normal mood and affect. Behavior is normal.      Assessment & Plan:   83 year old female with a past medical history of IBS with chronic constipation, GERD and history of colon polyps is here for follow-up.   1. Esophageal dysphagia/GERD/dysmotility -- she has esophageal dysmotility which contributes to her dysphagia. We have discussed this today. However, dysphagia symptoms did improve with empiric dilation from 2015 and she wishes to repeat this procedure. We discussed the risk benefits and alternatives and she is agreeable and wishes to proceed. Will plan EGD with empiric dilation. Continue to control reflux which can worsen dysphagia and dysmotility. She will continue pantoprazole 40 mg each morning and ranitidine 150 mg each evening. GERD diet reviewed --Will hold Eliquis 2 days prior to endoscopic procedures - will instruct when and how to resume after  procedure. Benefits and risks of procedure explained including risks of bleeding, perforation, infection, missed lesions, reactions to medications and possible need for hospitalization and surgery for complications. Additional rare but real risk of stroke or other vascular clotting events off Eliquis also  explained and need to seek urgent help if any signs of these problems occur. Will communicate by phone or EMR with patient's  prescribing provider to confirm that holding Eliquis is reasonable in this case.   2. Chronic constipation -- Linzess helpful but not completely. Continue Linzess 290 g daily and add MiraLAX 17 g daily  25 minutes spent with the patient today. Greater than 50% was spent in counseling and coordination of care with the patient

## 2017-02-08 ENCOUNTER — Encounter: Payer: Self-pay | Admitting: Podiatry

## 2017-02-08 ENCOUNTER — Ambulatory Visit (INDEPENDENT_AMBULATORY_CARE_PROVIDER_SITE_OTHER): Payer: PPO

## 2017-02-08 ENCOUNTER — Ambulatory Visit (INDEPENDENT_AMBULATORY_CARE_PROVIDER_SITE_OTHER): Payer: PPO | Admitting: Podiatry

## 2017-02-08 DIAGNOSIS — M19171 Post-traumatic osteoarthritis, right ankle and foot: Secondary | ICD-10-CM

## 2017-02-08 DIAGNOSIS — M779 Enthesopathy, unspecified: Secondary | ICD-10-CM

## 2017-02-08 DIAGNOSIS — M79675 Pain in left toe(s): Secondary | ICD-10-CM

## 2017-02-08 DIAGNOSIS — M79674 Pain in right toe(s): Secondary | ICD-10-CM | POA: Diagnosis not present

## 2017-02-08 DIAGNOSIS — B351 Tinea unguium: Secondary | ICD-10-CM | POA: Diagnosis not present

## 2017-02-08 NOTE — Telephone Encounter (Signed)
Patient with diagnosis of atrial flutter on Eliquis for anticoagulation.    Procedure: endoscopy Date of procedure: March 16, 2017  CHADS2 score of 6 (CHF, HTN, AGE, DM2, stroke/tia x 2);  CHADS2-VASc score of 8 (CHF, HTN, AGE, DM2, stroke/tia x 2, CAD, AGE, female)  CrCl 36.9 Platelet count 249  Per office protocol and reviewing with Dr. Fletcher Anon, patient can hold Eliquis for 36 hours prior to procedure.    Patient should restart Eliquis on the evening of procedure or day after, at discretion of procedure MD

## 2017-02-08 NOTE — Telephone Encounter (Signed)
Patient has been advised that per Dr Tyrell Antonio office, she may hold Eliquis 36 hours prior to procedure (last dose 10 pm, 03/14/17). Patient verbalizes understanding of this.

## 2017-02-08 NOTE — Progress Notes (Signed)
Subjective: 83 y.o. returns the office today for painful, elongated, thickened toenails which she cannot trim herself. Denies any redness or drainage around the nails. She also states that she's had pain to the office after the right ankle which is been ongoing the last 2 months. She does have a history of an ankle fracture several years ago. She denies any recent to trauma. She has no some mild [place] off after the ankle. Denies any redness or warmth. She is in no recent treatment for this. Denies any acute changes since last appointment and no new complaints today. Denies any systemic complaints such as fevers, chills, nausea, vomiting.   Objective: AAO 3, NAD DP/PT pulses palpable, CRT less than 3 seconds Nails hypertrophic, dystrophic, elongated, brittle, discolored 10. There is tenderness overlying the nails 1-5 bilaterally. There is no surrounding erythema or drainage along the nail sites. No open lesions or pre-ulcerative lesions are identified. There is tenderness to lateral aspect of the right foot on the sinus tarsi. This will close edema to this area but there is no erythema or increase in warmth. There is no pain along the ankle joint today. There is no pain lateral ankle ligaments. There is no pain with ankle or subtalar joint range of motion. No other areas of tenderness bilateral lower extremities. No overlying edema, erythema, increased warmth. No pain with calf compression, swelling, warmth, erythema.  Assessment: Patient presents with symptomatic onychomycosis; capsulitis sinus tarsi  Plan: -Treatment options including alternatives, risks, complications were discussed -Nails sharply debrided 10 without complication/bleeding. -X-rays of the right ankle were obtained and reviewed today. There is no evidence of acute fracture. Mild arthritic changes are present. -Discussed a steroid injection to the sinus tarsi on the right foot and she wishes to proceed understanding risks,  occasions. Under sterile conditions a makeshift Kenalog local and the second was infiltrated without complications. Post injection care was discussed. Tri-Lock ankle brace was dispensed -Discussed daily foot inspection. If there are any changes, to call the office immediately.  -Follow-up in 3 weeks or sooner if any problems are to arise. In the meantime, encouraged to call the office with any questions, concerns, changes symptoms.  Celesta Gentile, DPM

## 2017-02-18 DIAGNOSIS — R1032 Left lower quadrant pain: Secondary | ICD-10-CM | POA: Diagnosis not present

## 2017-02-18 DIAGNOSIS — K5909 Other constipation: Secondary | ICD-10-CM | POA: Diagnosis not present

## 2017-03-01 ENCOUNTER — Ambulatory Visit: Payer: PPO | Admitting: Podiatry

## 2017-03-10 ENCOUNTER — Encounter: Payer: Self-pay | Admitting: Internal Medicine

## 2017-03-10 ENCOUNTER — Encounter (INDEPENDENT_AMBULATORY_CARE_PROVIDER_SITE_OTHER): Payer: PPO | Admitting: Ophthalmology

## 2017-03-10 DIAGNOSIS — H43813 Vitreous degeneration, bilateral: Secondary | ICD-10-CM

## 2017-03-10 DIAGNOSIS — I10 Essential (primary) hypertension: Secondary | ICD-10-CM

## 2017-03-10 DIAGNOSIS — H34812 Central retinal vein occlusion, left eye, with macular edema: Secondary | ICD-10-CM | POA: Diagnosis not present

## 2017-03-10 DIAGNOSIS — E11311 Type 2 diabetes mellitus with unspecified diabetic retinopathy with macular edema: Secondary | ICD-10-CM | POA: Diagnosis not present

## 2017-03-10 DIAGNOSIS — H35033 Hypertensive retinopathy, bilateral: Secondary | ICD-10-CM

## 2017-03-10 DIAGNOSIS — E113312 Type 2 diabetes mellitus with moderate nonproliferative diabetic retinopathy with macular edema, left eye: Secondary | ICD-10-CM

## 2017-03-16 ENCOUNTER — Encounter: Payer: Self-pay | Admitting: Internal Medicine

## 2017-03-16 ENCOUNTER — Ambulatory Visit (AMBULATORY_SURGERY_CENTER): Payer: PPO | Admitting: Internal Medicine

## 2017-03-16 VITALS — BP 169/76 | HR 43 | Temp 97.5°F | Resp 13 | Ht 61.0 in | Wt 151.0 lb

## 2017-03-16 DIAGNOSIS — I509 Heart failure, unspecified: Secondary | ICD-10-CM | POA: Diagnosis not present

## 2017-03-16 DIAGNOSIS — R1310 Dysphagia, unspecified: Secondary | ICD-10-CM

## 2017-03-16 MED ORDER — SODIUM CHLORIDE 0.9 % IV SOLN: 500.0000 mL | INTRAVENOUS | Status: DC

## 2017-03-16 NOTE — Progress Notes (Signed)
Called to room to assist during endoscopic procedure.  Patient ID and intended procedure confirmed with present staff. Received instructions for my participation in the procedure from the performing physician.  

## 2017-03-16 NOTE — Patient Instructions (Signed)
RESUME YOUR ELIQUIS TODAY AT PREVIOUS DOSE.  SOFT FOODS ONLY TODAY, RESUME YOUR REGULAR DIET IN THE AM.      YOU HAD AN ENDOSCOPIC PROCEDURE TODAY AT [facility]:   Refer to the procedure report that was given to you for any specific questions about what was found during the examination.  If the procedure report does not answer your questions, please call your gastroenterologist to clarify.  If you requested that your care partner not be given the details of your procedure findings, then the procedure report has been included in a sealed envelope for you to review at your convenience later.  YOU SHOULD EXPECT: Some feelings of bloating in the abdomen. Passage of more gas than usual.  Walking can help get rid of the air that was put into your GI tract during the procedure and reduce the bloating. If you had a lower endoscopy (such as a colonoscopy or flexible sigmoidoscopy) you may notice spotting of blood in your stool or on the toilet paper. If you underwent a bowel prep for your procedure, you may not have a normal bowel movement for a few days.  Please Note:  You might notice some irritation and congestion in your nose or some drainage.  This is from the oxygen used during your procedure.  There is no need for concern and it should clear up in a day or so.  SYMPTOMS TO REPORT IMMEDIATELY:   Following lower endoscopy (colonoscopy or flexible sigmoidoscopy):  Excessive amounts of blood in the stool  Significant tenderness or worsening of abdominal pains  Swelling of the abdomen that is new, acute  Fever of 100F or higher   Following upper endoscopy (EGD)  Vomiting of blood or coffee ground material  New chest pain or pain under the shoulder blades  Painful or persistently difficult swallowing  New shortness of breath  Fever of 100F or higher  Black, tarry-looking stools  For urgent or emergent issues, a gastroenterologist can be reached at any hour by calling [phone]  [phone].   DIET:  Soft foods only today. Resume your regular diet in the am.  ACTIVITY:  You should plan to take it easy for the rest of today and you should NOT DRIVE or use heavy machinery until tomorrow (because of the sedation medicines used during the test).    FOLLOW UP: Our staff will call the number listed on your records the next business day following your procedure to check on you and address any questions or concerns that you may have regarding the information given to you following your procedure. If we do not reach you, we will leave a message.  However, if you are feeling well and you are not experiencing any problems, there is no need to return our call.  We will assume that you have returned to your regular daily activities without incident.  If any biopsies were taken you will be contacted by phone or by letter within the next 1-3 weeks.  Please call us at [phone] if you have not heard about the biopsies in 3 weeks.    SIGNATURES/CONFIDENTIALITY: You and/or your care partner have signed paperwork which will be entered into your electronic medical record.  These signatures attest to the fact that that the information above on your After Visit Summary has been reviewed and is understood.  Full responsibility of the confidentiality of this discharge information lies with you and/or your care-partner.

## 2017-03-16 NOTE — Progress Notes (Signed)
Report given to PACU, vss 

## 2017-03-16 NOTE — Op Note (Signed)
[place] Patient Name: Kerry Barnes Procedure Date: 03/16/2017 9:45 AM MRN: [id number] Endoscopist: Jerene Bears , MD Age: 83 Referring MD:  Date of Birth: 02/06/1934 Gender: Female Account #: [id number] Procedure:                Upper GI endoscopy Indications:              Dysphagia (previously responsive to dilation in                            2015), known GERD and known esophageal dysmotility Medicines:                Monitored Anesthesia Care Procedure:                Pre-Anesthesia Assessment:                           - Prior to the procedure, a History and Physical                            was performed, and patient medications and                            allergies were reviewed. The patient's tolerance of                            previous anesthesia was also reviewed. The risks                            and benefits of the procedure and the sedation                            options and risks were discussed with the patient.                            All questions were answered, and informed consent                            was obtained. Prior Anticoagulants: The patient has                            taken Eliquis (apixaban), last dose was 2 days                            prior to procedure. ASA Grade Assessment: III - A                            patient with severe systemic disease. After                            reviewing the risks and benefits, the patient was                            deemed in satisfactory condition to undergo the  procedure.                           After obtaining informed consent, the endoscope was                            passed under direct vision. Throughout the                            procedure, the patient's blood pressure, pulse, and                            oxygen saturations were monitored continuously. The                            Model GIF-[id number]) scope was  introduced                            through the mouth, and advanced to the second part                            of duodenum. The upper GI endoscopy was                            accomplished without difficulty. The patient                            tolerated the procedure well. Scope In: Scope Out: Findings:                 The examined esophagus was normal.                           Given prior response to esophageal dilation, a TTS                            dilator was passed through the scope. Dilation with                            a 16-17-18 mm balloon dilator was performed to 18                            mm in the lower third of the esophagus and across                            the GE junction without apparent complication.                           The entire examined stomach was normal.                           The examined duodenum was normal. Complications:            No immediate complications. Estimated Blood Loss:     Estimated blood loss: none. Impression:               -  Normal esophagus.                           - Normal stomach.                           - Normal examined duodenum.                           - Dilation performed in the lower third of the                            esophagus to 18 mm.                           - No specimens collected. Recommendation:           - Patient has a contact number available for                            emergencies. The signs and symptoms of potential                            delayed complications were discussed with the                            patient. Return to normal activities tomorrow.                            Written discharge instructions were provided to the                            patient.                           - Resume previous diet.                           - Continue present medications.                           - Resume Eliquis (apixaban) at prior dose today.                             Refer to managing physician for further adjustment                            of therapy. Jerene Bears, MD 03/16/2017 10:12:49 AM This report has been signed electronically.

## 2017-03-17 ENCOUNTER — Telehealth: Payer: Self-pay | Admitting: *Deleted

## 2017-03-17 NOTE — Telephone Encounter (Signed)
  Follow up Call-  Call back number 03/16/2017 01/16/2015  Post procedure Call Back phone  # [phone]  Permission to leave phone message Yes Yes  Some recent data might be hidden     Patient questions:  Do you have a fever, pain , or abdominal swelling? No. Pain Score  0 *  Have you tolerated food without any problems? Yes.    Have you been able to return to your normal activities? Yes.    Do you have any questions about your discharge instructions: Diet   No. Medications  No. Follow up visit  No.  Do you have questions or concerns about your Care? No.  Actions: * If pain score is 4 or above: No action needed, pain <4.

## 2017-04-01 DIAGNOSIS — E1122 Type 2 diabetes mellitus with diabetic chronic kidney disease: Secondary | ICD-10-CM | POA: Diagnosis not present

## 2017-04-01 DIAGNOSIS — Z794 Long term (current) use of insulin: Secondary | ICD-10-CM | POA: Diagnosis not present

## 2017-04-01 DIAGNOSIS — E7800 Pure hypercholesterolemia, unspecified: Secondary | ICD-10-CM | POA: Diagnosis not present

## 2017-04-01 DIAGNOSIS — K219 Gastro-esophageal reflux disease without esophagitis: Secondary | ICD-10-CM | POA: Diagnosis not present

## 2017-04-01 DIAGNOSIS — N183 Chronic kidney disease, stage 3 (moderate): Secondary | ICD-10-CM | POA: Diagnosis not present

## 2017-04-01 DIAGNOSIS — K5901 Slow transit constipation: Secondary | ICD-10-CM | POA: Diagnosis not present

## 2017-04-01 DIAGNOSIS — I4891 Unspecified atrial fibrillation: Secondary | ICD-10-CM | POA: Diagnosis not present

## 2017-04-08 ENCOUNTER — Other Ambulatory Visit: Payer: Self-pay | Admitting: Internal Medicine

## 2017-04-13 ENCOUNTER — Other Ambulatory Visit: Payer: Self-pay | Admitting: Internal Medicine

## 2017-04-14 ENCOUNTER — Other Ambulatory Visit: Payer: Self-pay | Admitting: Cardiovascular Disease

## 2017-04-14 MED ORDER — APIXABAN 5 MG PO TABS: ORAL_TABLET | ORAL | 0 refills | Status: DC

## 2017-04-14 NOTE — Telephone Encounter (Signed)
New message      *STAT* If patient is at the pharmacy, call can be transferred to refill team.   1. Which medications need to be refilled? (please list name of each medication and dose if known) apixaban (ELIQUIS) 5 MG TABS tablet  2. Which pharmacy/location (including street and city if local pharmacy) is medication to be sent to? [facility] on [address] / [address]   3. Do they need a 30 day or 90 day supply? 90 day supply

## 2017-04-24 DIAGNOSIS — R42 Dizziness and giddiness: Secondary | ICD-10-CM | POA: Diagnosis not present

## 2017-04-26 ENCOUNTER — Encounter (HOSPITAL_COMMUNITY): Payer: Self-pay | Admitting: Emergency Medicine

## 2017-04-26 ENCOUNTER — Emergency Department (HOSPITAL_COMMUNITY): Payer: PPO

## 2017-04-26 ENCOUNTER — Observation Stay (HOSPITAL_COMMUNITY)
Admission: EM | Admit: 2017-04-26 | Discharge: 2017-04-27 | Disposition: A | Discharge status: Home / Self Care | Payer: PPO | Attending: Internal Medicine | Admitting: Internal Medicine

## 2017-04-26 ENCOUNTER — Other Ambulatory Visit: Payer: Self-pay

## 2017-04-26 DIAGNOSIS — M79604 Pain in right leg: Secondary | ICD-10-CM | POA: Diagnosis not present

## 2017-04-26 DIAGNOSIS — Z794 Long term (current) use of insulin: Secondary | ICD-10-CM | POA: Diagnosis not present

## 2017-04-26 DIAGNOSIS — M79606 Pain in leg, unspecified: Secondary | ICD-10-CM | POA: Diagnosis present

## 2017-04-26 DIAGNOSIS — I110 Hypertensive heart disease with heart failure: Secondary | ICD-10-CM | POA: Insufficient documentation

## 2017-04-26 DIAGNOSIS — S79912A Unspecified injury of left hip, initial encounter: Secondary | ICD-10-CM | POA: Diagnosis not present

## 2017-04-26 DIAGNOSIS — E119 Type 2 diabetes mellitus without complications: Secondary | ICD-10-CM | POA: Diagnosis not present

## 2017-04-26 DIAGNOSIS — S8992XA Unspecified injury of left lower leg, initial encounter: Secondary | ICD-10-CM | POA: Diagnosis not present

## 2017-04-26 DIAGNOSIS — W19XXXS Unspecified fall, sequela: Secondary | ICD-10-CM | POA: Diagnosis not present

## 2017-04-26 DIAGNOSIS — M79605 Pain in left leg: Secondary | ICD-10-CM | POA: Insufficient documentation

## 2017-04-26 DIAGNOSIS — E1122 Type 2 diabetes mellitus with diabetic chronic kidney disease: Secondary | ICD-10-CM | POA: Diagnosis not present

## 2017-04-26 DIAGNOSIS — Z79899 Other long term (current) drug therapy: Secondary | ICD-10-CM | POA: Insufficient documentation

## 2017-04-26 DIAGNOSIS — R109 Unspecified abdominal pain: Secondary | ICD-10-CM | POA: Diagnosis not present

## 2017-04-26 DIAGNOSIS — I5032 Chronic diastolic (congestive) heart failure: Secondary | ICD-10-CM | POA: Insufficient documentation

## 2017-04-26 DIAGNOSIS — M549 Dorsalgia, unspecified: Secondary | ICD-10-CM | POA: Diagnosis not present

## 2017-04-26 DIAGNOSIS — N183 Chronic kidney disease, stage 3 unspecified: Secondary | ICD-10-CM | POA: Diagnosis present

## 2017-04-26 DIAGNOSIS — I130 Hypertensive heart and chronic kidney disease with heart failure and stage 1 through stage 4 chronic kidney disease, or unspecified chronic kidney disease: Secondary | ICD-10-CM | POA: Insufficient documentation

## 2017-04-26 DIAGNOSIS — S3991XA Unspecified injury of abdomen, initial encounter: Secondary | ICD-10-CM | POA: Diagnosis not present

## 2017-04-26 DIAGNOSIS — Z7901 Long term (current) use of anticoagulants: Secondary | ICD-10-CM | POA: Insufficient documentation

## 2017-04-26 DIAGNOSIS — N179 Acute kidney failure, unspecified: Secondary | ICD-10-CM | POA: Diagnosis present

## 2017-04-26 DIAGNOSIS — M5430 Sciatica, unspecified side: Secondary | ICD-10-CM | POA: Diagnosis not present

## 2017-04-26 DIAGNOSIS — M545 Low back pain: Secondary | ICD-10-CM | POA: Diagnosis not present

## 2017-04-26 DIAGNOSIS — M25552 Pain in left hip: Secondary | ICD-10-CM | POA: Diagnosis not present

## 2017-04-26 DIAGNOSIS — M5432 Sciatica, left side: Secondary | ICD-10-CM

## 2017-04-26 DIAGNOSIS — J45909 Unspecified asthma, uncomplicated: Secondary | ICD-10-CM | POA: Insufficient documentation

## 2017-04-26 DIAGNOSIS — M25562 Pain in left knee: Secondary | ICD-10-CM | POA: Diagnosis not present

## 2017-04-26 DIAGNOSIS — R1032 Left lower quadrant pain: Secondary | ICD-10-CM | POA: Diagnosis present

## 2017-04-26 DIAGNOSIS — M797 Fibromyalgia: Secondary | ICD-10-CM | POA: Insufficient documentation

## 2017-04-26 DIAGNOSIS — I129 Hypertensive chronic kidney disease with stage 1 through stage 4 chronic kidney disease, or unspecified chronic kidney disease: Secondary | ICD-10-CM | POA: Diagnosis not present

## 2017-04-26 LAB — BASIC METABOLIC PANEL
Anion gap: 7 (ref 5–15)
BUN: 18 mg/dL (ref 6–20)
CO2: 24 mmol/L (ref 22–32)
Calcium: 9.3 mg/dL (ref 8.9–10.3)
Chloride: 107 mmol/L (ref 101–111)
Creatinine, Ser: 2.02 mg/dL — ABNORMAL HIGH (ref 0.44–1.00)
GFR calc Af Amer: 25 mL/min — ABNORMAL LOW (ref >60)
GFR calc non Af Amer: 22 mL/min — ABNORMAL LOW (ref >60)
Glucose, Bld: 77 mg/dL (ref 65–99)
Potassium: 4.8 mmol/L (ref 3.5–5.1)
Sodium: 138 mmol/L (ref 135–145)

## 2017-04-26 LAB — CBC WITH DIFFERENTIAL/PLATELET
Basophils Absolute: 0 10*3/uL (ref 0.0–0.1)
Basophils Relative: 0 %
Eosinophils Absolute: 0.2 10*3/uL (ref 0.0–0.7)
Eosinophils Relative: 3 %
HCT: 39.9 % (ref 36.0–46.0)
Hemoglobin: 13 g/dL (ref 12.0–15.0)
Lymphocytes Relative: 35 %
Lymphs Abs: 2.1 10*3/uL (ref 0.7–4.0)
MCH: 28 pg (ref 26.0–34.0)
MCHC: 32.6 g/dL (ref 30.0–36.0)
MCV: 86 fL (ref 78.0–100.0)
Monocytes Absolute: 0.5 10*3/uL (ref 0.1–1.0)
Monocytes Relative: 8 %
Neutro Abs: 3.3 10*3/uL (ref 1.7–7.7)
Neutrophils Relative %: 54 %
Platelets: 215 10*3/uL (ref 150–400)
RBC: 4.64 MIL/uL (ref 3.87–5.11)
RDW: 17 % — ABNORMAL HIGH (ref 11.5–15.5)
WBC: 6 10*3/uL (ref 4.0–10.5)

## 2017-04-26 LAB — I-STAT CHEM 8, ED
BUN: 20 mg/dL (ref 6–20)
Calcium, Ion: 1.11 mmol/L — ABNORMAL LOW (ref 1.15–1.40)
Chloride: 106 mmol/L (ref 101–111)
Creatinine, Ser: 2.1 mg/dL — ABNORMAL HIGH (ref 0.44–1.00)
Glucose, Bld: 85 mg/dL (ref 65–99)
HCT: 40 % (ref 36.0–46.0)
Hemoglobin: 13.6 g/dL (ref 12.0–15.0)
Potassium: 4.6 mmol/L (ref 3.5–5.1)
Sodium: 140 mmol/L (ref 135–145)
TCO2: 24 mmol/L (ref 22–32)

## 2017-04-26 LAB — CORTISOL: Cortisol, Plasma: 3.8 ug/dL

## 2017-04-26 LAB — URINALYSIS, ROUTINE W REFLEX MICROSCOPIC
Bilirubin Urine: NEGATIVE
Glucose, UA: NEGATIVE mg/dL
Hgb urine dipstick: NEGATIVE
Ketones, ur: NEGATIVE mg/dL
Nitrite: NEGATIVE
Protein, ur: NEGATIVE mg/dL
Specific Gravity, Urine: 1.01 (ref 1.005–1.030)
pH: 6 (ref 5.0–8.0)

## 2017-04-26 LAB — GLUCOSE, CAPILLARY: Glucose-Capillary: 95 mg/dL (ref 65–99)

## 2017-04-26 MED ORDER — APIXABAN 5 MG PO TABS
5.0000 mg | ORAL_TABLET | Freq: Two times a day (BID) | ORAL | Status: DC
  Administered 2017-04-26 – 2017-04-27 (×2): 5 mg via ORAL
  Filled 2017-04-26 (×2): qty 1

## 2017-04-26 MED ORDER — APIXABAN 5 MG PO TABS: 5.0000 mg | ORAL_TABLET | Freq: Two times a day (BID) | ORAL | Status: DC

## 2017-04-26 MED ORDER — DOCUSATE SODIUM 100 MG PO CAPS
100.0000 mg | ORAL_CAPSULE | Freq: Two times a day (BID) | ORAL | Status: DC
  Administered 2017-04-26 – 2017-04-27 (×2): 100 mg via ORAL
  Filled 2017-04-26 (×2): qty 1

## 2017-04-26 MED ORDER — FLUTICASONE PROPIONATE 50 MCG/ACT NA SUSP
1.0000 | Freq: Every day | NASAL | Status: DC
  Administered 2017-04-26 – 2017-04-27 (×2): 1 via NASAL
  Filled 2017-04-26: qty 16

## 2017-04-26 MED ORDER — SODIUM CHLORIDE 0.9 % IV BOLUS (SEPSIS)
1000.0000 mL | Freq: Once | INTRAVENOUS | Status: AC
  Administered 2017-04-26: 1000 mL via INTRAVENOUS

## 2017-04-26 MED ORDER — ATORVASTATIN CALCIUM 40 MG PO TABS
40.0000 mg | ORAL_TABLET | Freq: Every day | ORAL | Status: DC
  Administered 2017-04-27: 40 mg via ORAL
  Filled 2017-04-26: qty 1

## 2017-04-26 MED ORDER — ALPRAZOLAM 0.5 MG PO TABS: 0.5000 mg | ORAL_TABLET | Freq: Two times a day (BID) | ORAL | Status: DC | PRN

## 2017-04-26 MED ORDER — MOMETASONE FURO-FORMOTEROL FUM 100-5 MCG/ACT IN AERO
2.0000 | INHALATION_SPRAY | Freq: Two times a day (BID) | RESPIRATORY_TRACT | Status: DC
  Administered 2017-04-27: 2 via RESPIRATORY_TRACT
  Filled 2017-04-26: qty 8.8

## 2017-04-26 MED ORDER — INSULIN GLARGINE 100 UNIT/ML ~~LOC~~ SOLN
24.0000 [IU] | Freq: Every day | SUBCUTANEOUS | Status: DC
  Administered 2017-04-26: 24 [IU] via SUBCUTANEOUS
  Filled 2017-04-26 (×2): qty 0.24

## 2017-04-26 MED ORDER — MORPHINE SULFATE (PF) 4 MG/ML IV SOLN
2.0000 mg | Freq: Once | INTRAVENOUS | Status: AC
  Administered 2017-04-26: 2 mg via INTRAMUSCULAR
  Filled 2017-04-26: qty 1

## 2017-04-26 MED ORDER — ACETAMINOPHEN 325 MG PO TABS: 650.0000 mg | ORAL_TABLET | ORAL | Status: DC | PRN

## 2017-04-26 MED ORDER — AMIODARONE HCL 200 MG PO TABS
200.0000 mg | ORAL_TABLET | Freq: Every day | ORAL | Status: DC
  Administered 2017-04-27: 200 mg via ORAL
  Filled 2017-04-26: qty 1

## 2017-04-26 MED ORDER — ALBUTEROL SULFATE (2.5 MG/3ML) 0.083% IN NEBU: 3.0000 mL | INHALATION_SOLUTION | Freq: Four times a day (QID) | RESPIRATORY_TRACT | Status: DC | PRN

## 2017-04-26 MED ORDER — METHOCARBAMOL 500 MG PO TABS
500.0000 mg | ORAL_TABLET | Freq: Four times a day (QID) | ORAL | Status: DC | PRN
  Administered 2017-04-26 – 2017-04-27 (×2): 500 mg via ORAL
  Filled 2017-04-26 (×2): qty 1

## 2017-04-26 MED ORDER — SODIUM CHLORIDE 0.9 % IV SOLN
INTRAVENOUS | Status: AC
  Administered 2017-04-26 – 2017-04-27 (×2): via INTRAVENOUS

## 2017-04-26 MED ORDER — INSULIN ASPART 100 UNIT/ML ~~LOC~~ SOLN: 0.0000 [IU] | Freq: Every day | SUBCUTANEOUS | Status: DC

## 2017-04-26 MED ORDER — DILTIAZEM HCL ER COATED BEADS 120 MG PO CP24
120.0000 mg | ORAL_CAPSULE | Freq: Every day | ORAL | Status: DC
  Administered 2017-04-27: 120 mg via ORAL
  Filled 2017-04-26: qty 1

## 2017-04-26 MED ORDER — ACETAMINOPHEN 500 MG PO TABS
1000.0000 mg | ORAL_TABLET | Freq: Once | ORAL | Status: AC
  Administered 2017-04-26: 1000 mg via ORAL
  Filled 2017-04-26: qty 2

## 2017-04-26 MED ORDER — PANTOPRAZOLE SODIUM 40 MG PO TBEC
40.0000 mg | DELAYED_RELEASE_TABLET | Freq: Every day | ORAL | Status: DC
  Administered 2017-04-27: 40 mg via ORAL
  Filled 2017-04-26: qty 1

## 2017-04-26 MED ORDER — LORATADINE 10 MG PO TABS
10.0000 mg | ORAL_TABLET | Freq: Every day | ORAL | Status: DC
  Administered 2017-04-27: 10 mg via ORAL
  Filled 2017-04-26: qty 1

## 2017-04-26 MED ORDER — INSULIN ASPART 100 UNIT/ML ~~LOC~~ SOLN: 0.0000 [IU] | Freq: Three times a day (TID) | SUBCUTANEOUS | Status: DC

## 2017-04-26 MED ORDER — MECLIZINE HCL 12.5 MG PO TABS: 25.0000 mg | ORAL_TABLET | Freq: Three times a day (TID) | ORAL | Status: DC | PRN

## 2017-04-26 NOTE — ED Notes (Signed)
When Pt was asked to ambulate in hallway, Pt stated that she would not be able to because the left leg "gives out" on her. When Pt was being roomed, Pt's leg did indeed give out when attempting to move from wheelchair to bed--she did not fall but it was close.

## 2017-04-26 NOTE — ED Notes (Signed)
Patient transported to CT 

## 2017-04-26 NOTE — ED Triage Notes (Signed)
Pt here from home with c/o left hip and left knee pain after a trip and fall at church yesterday. Pt did no thit her head

## 2017-04-26 NOTE — ED Notes (Signed)
Blood was attempted to be collected twice but both times were unsuccessful. Pt said she was thirsty and requested water to drink (which may make collecting her blood easier as well).

## 2017-04-26 NOTE — Progress Notes (Signed)
Pt arrived to the unit Alert and Oriented X 4. Pt. States Pain in the left hip but just got meds in the ED. Pt. Oriented to equipment  in the room.

## 2017-04-26 NOTE — ED Notes (Signed)
Pt attempted to ambulate again--extremely unsteady, little to no balance or coordination of the left leg. Still complaining of pain despite medication. Only walked about 1.5 feet total and we immediatly returned to the bed due to her evidently high fall risk.

## 2017-04-26 NOTE — ED Notes (Signed)
Patient transported to X-ray 

## 2017-04-26 NOTE — H&P (Signed)
History and Physical    Kerry Barnes YQM:[id number] DOB: [birth year] DOA: 04/26/2017  Referring MD/NP/PA: er PCP: Aletha Halim., PA-C Outpatient Specialists:  Patient coming from: home  Chief Complaint: fall/leg pain  HPI: Kerry Barnes is a 83 y.o. female with medical history significant of a fib, IBS, fibromyalgia, DM who lives at home with her husband.  She has had several fall recently, last one was yesterday at church.  She had left back, hip and knee pain.  She c/o her left leg "giving out".  Her pain is described as a 9/10 but not sharp nor radiating but goes all the way down to her left ankle.  No bowel or bladder incontinence.  No fever or chills.  No chest pain or shortness of breath.   ED Course: Labs were remarkable for an elevated creatinine from baseline.  Multiple radiologic studies were done, x ray and CT scan-- Ct of lumbar spine showed spinal stenosis  Review of Systems: all systems reviewed, negative unless stated above in HPI   Past Medical History:  Diagnosis Date  . Allergy   . Anxiety   . Arthritis   . Asthma   . Atrial fibrillation ([place])   . Atrial flutter ([place])   . Blood transfusion without reported diagnosis   . Cataract   . Chronic diastolic CHF (congestive heart failure) ([place])   . Diabetes mellitus   . Fibromyalgia   . GERD (gastroesophageal reflux disease)   . Hyperlipemia   . Hypertension   . IBS (irritable bowel syndrome)   . Stroke [facility]) 2013   TIA  . Tubular adenoma of colon     Past Surgical History:  Procedure Laterality Date  . ANKLE FRACTURE SURGERY    . CHOLECYSTECTOMY    . TUBAL LIGATION    . WRIST FRACTURE SURGERY       reports that  has never smoked. she has never used smokeless tobacco. She reports that she does not drink alcohol or use drugs.  Allergies  Allergen Reactions  . Codeine Nausea Only  . Metformin Diarrhea  . Amoxicillin Rash  . Biaxin [Clarithromycin] Rash  . Ciprofloxacin Rash  .  Penicillins Rash    Childhood allergic reaction Has patient had a PCN reaction causing immediate rash, facial/tongue/throat swelling, SOB or lightheadedness with hypotension: Yes Has patient had a PCN reaction causing severe rash involving mucus membranes or skin necrosis: No Has patient had a PCN reaction that required hospitalization: Yes Has patient had a PCN reaction occurring within the last 10 years: No If all of the above answers are "NO", then may proceed with Cephalosporin use.    Family History  Problem Relation Age of Onset  . Diabetes Mother   . Diabetes Father   . Diabetes Sister   . Diabetes Brother   . Colon cancer Neg Hx   . Stomach cancer Neg Hx      Prior to Admission medications   Medication Sig Start Date End Date Taking? Authorizing Provider  acetaminophen (TYLENOL) 325 MG tablet Take 2 tablets (650 mg total) by mouth every 4 (four) hours as needed for headache or mild pain. 02/05/16   Erlene Quan, PA-C  albuterol (PROVENTIL HFA;VENTOLIN HFA) 108 (90 BASE) MCG/ACT inhaler Inhale into the lungs every 6 (six) hours as needed for wheezing or shortness of breath.    [provider]  ALPRAZolam Duanne Moron) 0.5 MG tablet Take 0.5 mg by mouth 2 (two) times daily as needed for anxiety.  [provider]  amiodarone (PACERONE) 200 MG tablet Take 1 tablet (200 mg total) by mouth daily. 08/18/16   Wellington Hampshire, MD  apixaban (ELIQUIS) 5 MG TABS tablet Take 1 tablet by mouth twice daily, please schedule MD appt for further refills 04/14/17   Wellington Hampshire, MD  atorvastatin (LIPITOR) 40 MG tablet Take 40 mg by mouth daily.    [provider]  cetirizine (ZYRTEC) 10 MG tablet Take 10 mg by mouth daily.    [provider]  clobetasol ointment (TEMOVATE) 7.62 % Apply 1 application topically 2 (two) times daily.    [provider]  diltiazem (CARDIZEM CD) 120 MG 24 hr capsule Take 1 capsule (120 mg total) by mouth daily. 08/18/16    Wellington Hampshire, MD  EPINEPHrine (EPI-PEN) 0.3 mg/0.3 mL SOAJ injection Inject into the muscle once.    [provider]  FLUOCINOLONE ACETONIDE BODY 0.01 % OIL Apply 1 application topically daily. 09/19/15   [provider]  Fluticasone-Salmeterol (ADVAIR) 100-50 MCG/DOSE AEPB Inhale 1 puff into the lungs every 12 (twelve) hours.    [provider]  insulin glargine (LANTUS) 100 UNIT/ML injection Inject 24 Units into the skin at bedtime.     [provider]  levocetirizine (XYZAL) 5 MG tablet  04/14/16   [provider]  linaclotide Rolan Lipa) 290 MCG CAPS capsule Take 1 capsule (290 mcg total) by mouth daily before breakfast. Patient not taking: Reported on 03/16/2017 07/23/16   Pyrtle, Lajuan Lines, MD  meclizine (ANTIVERT) 25 MG tablet Take 25 mg by mouth 3 (three) times daily as needed for dizziness.     [provider]  mometasone (NASONEX) 50 MCG/ACT nasal spray Place 2 sprays into the nose daily.    [provider]  montelukast (SINGULAIR) 10 MG tablet  04/14/16   [provider]  olopatadine (PATANOL) 0.1 % ophthalmic solution Place 1 drop into both eyes 2 (two) times daily.     [provider]  pantoprazole (PROTONIX) 40 MG tablet Take 1 tablet (40 mg total) by mouth daily. 08/04/16   Pyrtle, Lajuan Lines, MD  pregabalin (LYRICA) 75 MG capsule Take 75 mg by mouth 2 (two) times daily.    [provider]  Propylene Glycol (SYSTANE BALANCE OP) Apply 1 drop to eye daily as needed (dryness).    [provider]  ranitidine (ZANTAC) 150 MG capsule TAKE 1 CAPSULE(150 MG) BY MOUTH EVERY EVENING 04/09/17   Pyrtle, Lajuan Lines, MD  traMADol (ULTRAM) 50 MG tablet Take 50 mg by mouth at bedtime as needed for severe pain.  09/18/15   [provider]    Physical Exam: Vitals:   04/26/17 1145 04/26/17 1300 04/26/17 1315 04/26/17 1600  BP: (!) 149/57 (!) 140/45  (!) 122/50  Pulse: (!) 47  (!) 50 (!) 49  Resp:    16    Temp:      TempSrc:      SpO2: 96%  100% 96%      Constitutional: NAD, calm, comfortable Vitals:   04/26/17 1145 04/26/17 1300 04/26/17 1315 04/26/17 1600  BP: (!) 149/57 (!) 140/45  (!) 122/50  Pulse: (!) 47  (!) 50 (!) 49  Resp:    16  Temp:      TempSrc:      SpO2: 96%  100% 96%   Eyes: PERRL, lids and conjunctivae normal ENMT: Mucous membranes are DRY. Posterior pharynx clear of any exudate or lesions.Normal dentition.  Neck: normal,  supple, no masses, no thyromegaly Respiratory: clear to auscultation bilaterally, no wheezing, no crackles. Normal respiratory effort. No accessory muscle use.  Cardiovascular: Regular rate and rhythm, no murmurs / rubs / gallops. No extremity edema. 2+ pedal pulses. No carotid bruits.  Abdomen: no tenderness, no masses palpated. No hepatosplenomegaly. Bowel sounds positive.  Musculoskeletal: pain in left leg with dorsi and plantar flexion.  Skin: no rashes, lesions, ulcers. No induration Neurologic: CN 2-12 grossly intact. Sensation intact, DTR normal. Strength 5/5 in all 4.  Psychiatric: Normal judgment and insight. Alert and oriented x 3. Normal mood.    Labs on Admission: I have personally reviewed following labs and imaging studies  CBC: Recent Labs  Lab 04/26/17 1315 04/26/17 1329  WBC 6.0  --   NEUTROABS 3.3  --   HGB 13.0 13.6  HCT 39.9 40.0  MCV 86.0  --   PLT 215  --    Basic Metabolic Panel: Recent Labs  Lab 04/26/17 1313 04/26/17 1329  NA 138 140  K 4.8 4.6  CL 107 106  CO2 24  --   GLUCOSE 77 85  BUN 18 20  CREATININE 2.02* 2.10*  CALCIUM 9.3  --    GFR: CrCl cannot be calculated (Unknown ideal weight.). Liver Function Tests: No results for input(s): AST, ALT, ALKPHOS, BILITOT, PROT, ALBUMIN in the last 168 hours. No results for input(s): LIPASE, AMYLASE in the last 168 hours. No results for input(s): AMMONIA in the last 168 hours. Coagulation Profile: No results for input(s): INR, PROTIME in the last 168  hours. Cardiac Enzymes: No results for input(s): CKTOTAL, CKMB, CKMBINDEX, TROPONINI in the last 168 hours. BNP (last 3 results) No results for input(s): PROBNP in the last 8760 hours. HbA1C: No results for input(s): HGBA1C in the last 72 hours. CBG: No results for input(s): GLUCAP in the last 168 hours. Lipid Profile: No results for input(s): CHOL, HDL, LDLCALC, TRIG, CHOLHDL, LDLDIRECT in the last 72 hours. Thyroid Function Tests: No results for input(s): TSH, T4TOTAL, FREET4, T3FREE, THYROIDAB in the last 72 hours. Anemia Panel: No results for input(s): VITAMINB12, FOLATE, FERRITIN, TIBC, IRON, RETICCTPCT in the last 72 hours. Urine analysis:    Component Value Date/Time   COLORURINE YELLOW 04/26/2017 [place] 04/26/2017 1524   LABSPEC 1.010 04/26/2017 1524   PHURINE 6.0 04/26/2017 1524   GLUCOSEU NEGATIVE 04/26/2017 1524   HGBUR NEGATIVE 04/26/2017 1524   BILIRUBINUR NEGATIVE 04/26/2017 [place] 04/26/2017 1524   PROTEINUR NEGATIVE 04/26/2017 1524   UROBILINOGEN 0.2 09/08/2010 1330   NITRITE NEGATIVE 04/26/2017 1524   LEUKOCYTESUR MODERATE (A) 04/26/2017 1524   Sepsis Labs: Invalid input(s): PROCALCITONIN, LACTICIDVEN No results found for this or any previous visit (from the past 240 hour(s)).   Radiological Exams on Admission: Dg Lumbar Spine Complete  Result Date: 04/26/2017 CLINICAL DATA:  Lower back pain after fall yesterday. EXAM: LUMBAR SPINE - COMPLETE 4+ VIEW COMPARISON:  Lumbar spine MRI dated July 09, 2012. FINDINGS: Unchanged mild dextroscoliosis. No acute fracture or subluxation. Vertebral body heights are preserved. Straightening of the normal lumbar lordosis. Sagittal alignment is maintained. Mild-to-moderate multilevel disc height loss and endplate spurring from D3-O6 to L5-S1. Moderate right greater than left lower lumbar facet arthropathy. Vascular calcifications. Osteopenia. Prior cholecystectomy. IMPRESSION:  Mild-to-moderate multilevel degenerative changes, similar to prior study. No acute osseous abnormality. Electronically Signed   By: Titus Dubin [name]   On: 04/26/2017 12:39   Ct L-spine No Charge  Result Date:  04/26/2017 CLINICAL DATA:  Back pain bilateral flank pain EXAM: CT LUMBAR SPINE WITHOUT CONTRAST TECHNIQUE: Multidetector CT imaging of the lumbar spine was performed without intravenous contrast administration. Multiplanar CT image reconstructions were also generated. COMPARISON:  MRI lumbar spine 07/09/2012, lumbar radiographs 04/26/2017 FINDINGS: Segmentation: Standard Alignment: Normal alignment. Dextroscoliosis at L2-3. Disc degeneration and spurring on the left at L2-3 and L3-4. Disc degeneration and spurring on the right L5-S1 related to scoliosis Vertebrae: Negative for fracture or mass. Paraspinal and other soft tissues: Atherosclerotic aorta without aneurysm. No retroperitoneal mass Disc levels: L1-2: Diffuse bulging of the disc with facet degeneration. No significant stenosis L2-3: Disc degeneration and spurring on the left with mild left foraminal narrowing. Spinal canal normal in size L3-4: Disc degeneration left greater than right with disc bulging. Severe facet hypertrophy with severe spinal stenosis. This has progressed since the prior MRI. Moderate foraminal stenosis on the left due to spurring L4-5: Diffuse bulging of the disc. Right facet hypertrophy with severe subarticular and foraminal stenosis on the right. L5-S1: Disc degeneration and spurring on the right with moderate right foraminal stenosis. Right-sided facet hypertrophy IMPRESSION: Lumbar scoliosis.  Negative for fracture Mild left foraminal narrowing L2-3 Severe spinal stenosis L3-4 with marked posterior element hypertrophy. Moderate foraminal stenosis on the left Severe subarticular foraminal stenosis on the right at L4-5 Moderate right foraminal stenosis L5-S1. Electronically Signed   By: Franchot Gallo [name]   On:  04/26/2017 16:03   Dg Knee Complete 4 Views Left  Result Date: 04/26/2017 CLINICAL DATA:  [place] from wheelchair yesterday.  Knee pain. EXAM: LEFT KNEE - COMPLETE 4+ VIEW COMPARISON:  None. FINDINGS: No evidence of fracture, dislocation, or joint effusion. No evidence of arthropathy or other focal bone abnormality. Soft tissues are unremarkable. IMPRESSION: Normal Electronically Signed   By: Nelson Chimes [name]   On: 04/26/2017 11:11   Ct Renal Stone Study  Result Date: 04/26/2017 CLINICAL DATA:  Bilateral flank pain. Low back pain after a fall yesterday in charge. EXAM: CT ABDOMEN AND PELVIS WITHOUT CONTRAST TECHNIQUE: Multidetector CT imaging of the abdomen and pelvis was performed following the standard protocol without IV contrast. COMPARISON:  CT scan dated 10/27/2010 FINDINGS: Lower chest: No acute abnormality. Hepatobiliary: There is diffuse increased density of the liver parenchyma, new since the prior study. No focal liver abnormality is seen. Status post cholecystectomy. Common bile duct is slightly distended, within normal limits for postcholecystectomy patient. Pancreas: Unremarkable. No pancreatic ductal dilatation or surrounding inflammatory changes. Spleen: Normal in size without focal abnormality. Adrenals/Urinary Tract: Slight nonspecific haziness around both normal appearing adrenal glands. The kidneys, ureters, and bladder appear normal. Stomach/Bowel: Stomach is within normal limits. Appendix appears normal. No evidence of bowel wall thickening, distention, or inflammatory changes. Vascular/Lymphatic: Aortic atherosclerosis. No enlarged abdominal or pelvic lymph nodes. Reproductive: Uterus and bilateral adnexa are unremarkable. Other: No abdominal wall hernia or abnormality. No abdominopelvic ascites. Musculoskeletal: There is edema in the subcutaneous fat just lateral to the left greater trochanter consistent with soft tissue contusion. There are no fractures. Multilevel degenerative disc  and joint disease in the lumbar spine. IMPRESSION: 1. New bilateral haziness around the normal appearing adrenal glands. This could represent adrenal congestion which can be a precursor to adrenal hemorrhage, which can lead to acute adrenal insufficiency. 2. Marked new increased density in the liver which can be seen with hemochromatosis. However, this can also be seen in patients taking amiodarone, developing phospholipidosis, which can be demonstrated on biopsy. This patient reportedly does  take amiodarone. 3. No acute bone abnormality. No renal or ureteral or bladder calculi. 4. Aortic atherosclerosis. Electronically Signed   By: Lorriane Shire [name]   On: 04/26/2017 16:19   Dg Hip Unilat W Or Wo Pelvis 2-3 Views Left  Result Date: 04/26/2017 CLINICAL DATA:  [place] from wheelchair yesterday.  Left hip pain. EXAM: DG HIP (WITH OR WITHOUT PELVIS) 2-3V LEFT COMPARISON:  None. FINDINGS: No evidence of fracture or dislocation. Chronic symphysis degenerative changes. IMPRESSION: No acute or traumatic finding. Electronically Signed   By: Nelson Chimes [name]   On: 04/26/2017 11:10     Assessment/Plan Active Problems:   Type 2 diabetes with stage 3 chronic kidney disease GFR 30-59 (HCC)   Leg pain   AKI (acute kidney injury) ([place])  Leg pain due to sciatica -robaxin PRN -heat -PT eval -may need short tern SNF  AKI -gentle IVF overnight -recheck labs in AM  DM -resume home meds -SSI  fibromyalgia  H/o a fib -continue NOAC   DVT prophylaxis: NOAC Code Status: full Family Communication: husband at bedside Disposition Plan:  Consults called:  Admission status: obs, [place] [facility] Pager [phone]  If 7PM-7AM, please contact night-coverage [email or web address] Password [email or web address]  04/26/2017, 5:06 PM

## 2017-04-26 NOTE — ED Provider Notes (Signed)
[facility]W PROGRESSIVE CARE Provider Note   CSN: [id number] Arrival date & time: 04/26/17  6283     History   Chief Complaint Chief Complaint  Patient presents with  . Fall  . Knee Pain  . Hip Pain    HPI LARRISHA BABINEAU is a 83 y.o. female.  83 yo F with a chief complaints of left-sided low back pain.  This started after she had a fall this morning.  The patient felt that her wheelchair wheel got turned sideways and she lost her balance and landed sprawled out on her abdomen.  She is complaining of pain to the left low back that radiates to her left thigh.  She tried to walk and when she bears weight on that left side she had significant increase of her pain to that leg.  They then decided to come to the ED.  She denies loss of bowel or bladder denies abdominal pain chest pain shortness of breath.  Denies head injury or loss of consciousness.  Denies neck pain.  Denies other areas of extremity pain.  Scribes the pain is sharp and shooting.   The history is provided by the patient.  Fall  This is a new problem. The current episode started less than 1 hour ago. The problem occurs constantly. The problem has not changed since onset.Pertinent negatives include no chest pain, no headaches and no shortness of breath. Nothing aggravates the symptoms. Nothing relieves the symptoms. She has tried nothing for the symptoms. The treatment provided no relief.  Knee Pain    Hip Pain  Pertinent negatives include no chest pain, no headaches and no shortness of breath.    Past Medical History:  Diagnosis Date  . Allergy   . Anxiety   . Arthritis   . Asthma   . Atrial fibrillation ([place])   . Atrial flutter ([place])   . Blood transfusion without reported diagnosis   . Cataract   . Chronic diastolic CHF (congestive heart failure) ([place])   . Diabetes mellitus   . Fibromyalgia   . GERD (gastroesophageal reflux disease)   . Hyperlipemia   . Hypertension   . IBS (irritable bowel  syndrome)   . Stroke [facility]) 2013   TIA  . Tubular adenoma of colon     Patient Active Problem List   Diagnosis Date Noted  . AKI (acute kidney injury) ([place]) 04/26/2017  . Chronic renal insufficiency, stage III (moderate) ([place]) 04/17/2016  . Chronic anticoagulation 02/05/2016  . Chest pain   . Acute kidney injury superimposed on CKD ([place])   . Atrial flutter with rapid ventricular response ([place]) 01/31/2016  . Asthma with acute exacerbation 09/10/2015  . Difficulty swallowing 09/10/2015  . History of acute bronchitis 09/10/2015  . Moderate persistent asthma 09/10/2015  . Sore throat 09/10/2015  . Left carotid bruit 04/23/2015  . Leg pain 10/10/2013  . Stroke ([place]) 06/16/2011  . Chronic diastolic heart failure ([place]) 11/06/2009  . PULMONARY HYPERTENSION, SECONDARY 07/04/2009  . ARM NUMBNESS 07/04/2009  . Type 2 diabetes with stage 3 chronic kidney disease GFR 30-59 ([place]) 07/03/2009  . Hyperlipidemia 07/03/2009  . MITRAL REGURGITATION, MILD 07/03/2009  . Essential hypertension 07/03/2009  . Gastroparesis 07/03/2009  . DIVERTICULOSIS, COLON, WITH HEMORRHAGE 07/03/2009  . CONSTIPATION 07/03/2009  . IBS 07/03/2009  . ARTHRITIS 07/03/2009  . MIGRAINES, HX OF 07/03/2009  . History of disease 07/03/2009  . EUSTACHIAN TUBE DYSFUNCTION 04/04/2007  . ALLERGIC RHINITIS 04/04/2007  . ASTHMA 04/04/2007  . ESOPHAGITIS,  REFLUX 04/04/2007  . Asthma 04/04/2007    Past Surgical History:  Procedure Laterality Date  . ANKLE FRACTURE SURGERY    . CHOLECYSTECTOMY    . TUBAL LIGATION    . WRIST FRACTURE SURGERY      OB History    No data available       Home Medications    Prior to Admission medications   Medication Sig Start Date End Date Taking? Authorizing Provider  acetaminophen (TYLENOL) 325 MG tablet Take 2 tablets (650 mg total) by mouth every 4 (four) hours as needed for headache or mild pain. 02/05/16  Yes Kilroy, Luke K, PA-C  albuterol (PROVENTIL HFA;VENTOLIN HFA) 108 (90  BASE) MCG/ACT inhaler Inhale into the lungs every 6 (six) hours as needed for wheezing or shortness of breath.   Yes [provider]  ALPRAZolam Duanne Moron) 0.5 MG tablet Take 0.5 mg by mouth 2 (two) times daily as needed for anxiety.   Yes [provider]  amiodarone (PACERONE) 200 MG tablet Take 1 tablet (200 mg total) by mouth daily. 08/18/16  Yes Wellington Hampshire, MD  apixaban (ELIQUIS) 5 MG TABS tablet Take 1 tablet by mouth twice daily, please schedule MD appt for further refills Patient taking differently: Take 5 mg 2 (two) times daily by mouth. , please schedule MD appt for further refills 04/14/17  Yes Wellington Hampshire, MD  atorvastatin (LIPITOR) 40 MG tablet Take 40 mg by mouth daily.   Yes [provider]  cetirizine (ZYRTEC) 10 MG tablet Take 10 mg by mouth daily.   Yes [provider]  diltiazem (CARDIZEM SR) 60 MG 12 hr capsule Take 60 mg daily by mouth. 04/16/17  Yes [provider]  EPINEPHrine (EPI-PEN) 0.3 mg/0.3 mL SOAJ injection Inject 0.3 mg once as needed into the muscle (severe allergic reaction).    Yes [provider]  Fluticasone-Salmeterol (ADVAIR) 100-50 MCG/DOSE AEPB Inhale 1 puff into the lungs every 12 (twelve) hours.   Yes [provider]  Insulin Glargine (LANTUS SOLOSTAR) 100 UNIT/ML Solostar Pen Inject 18 Units daily before breakfast into the skin.   Yes [provider]  linaclotide Rolan Lipa) 290 MCG CAPS capsule Take 1 capsule (290 mcg total) by mouth daily before breakfast. 07/23/16  Yes Pyrtle, Lajuan Lines, MD  meclizine (ANTIVERT) 25 MG tablet Take 25 mg by mouth 3 (three) times daily as needed for dizziness.    Yes [provider]  mometasone (NASONEX) 50 MCG/ACT nasal spray Place 2 sprays into the nose daily.   Yes [provider]  montelukast (SINGULAIR) 10 MG tablet Take 10 mg at bedtime by mouth.  04/14/16  Yes [provider]  olopatadine (PATANOL) 0.1 % ophthalmic  solution Place 1 drop into both eyes 2 (two) times daily.    Yes [provider]  pantoprazole (PROTONIX) 40 MG tablet Take 1 tablet (40 mg total) by mouth daily. 08/04/16  Yes Pyrtle, Lajuan Lines, MD  pregabalin (LYRICA) 75 MG capsule Take 75 mg by mouth 2 (two) times daily.   Yes [provider]  Propylene Glycol (SYSTANE BALANCE OP) Place 1 drop daily as needed into both eyes (dryness).    Yes [provider]  ranitidine (ZANTAC) 150 MG capsule TAKE 1 CAPSULE(150 MG) BY MOUTH EVERY EVENING 04/09/17  Yes Pyrtle, Lajuan Lines, MD  traMADol (ULTRAM) 50 MG tablet Take 50 mg every 12 (twelve) hours as needed by mouth (pain).  09/18/15  Yes [provider]  diltiazem (CARDIZEM CD)  120 MG 24 hr capsule Take 1 capsule (120 mg total) by mouth daily. Patient not taking: Reported on 04/26/2017 08/18/16   Wellington Hampshire, MD    Family History Family History  Problem Relation Age of Onset  . Diabetes Mother   . Diabetes Father   . Diabetes Sister   . Diabetes Brother   . Colon cancer Neg Hx   . Stomach cancer Neg Hx     Social History Social History   Tobacco Use  . Smoking status: Never Smoker  . Smokeless tobacco: Never Used  Substance Use Topics  . Alcohol use: No    Alcohol/week: 0.0 oz  . Drug use: No     Allergies   Codeine; Metformin; Amoxicillin; Biaxin [clarithromycin]; Ciprofloxacin; and Penicillins   Review of Systems Review of Systems  Constitutional: Negative for chills and fever.  HENT: Negative for congestion and rhinorrhea.   Eyes: Negative for redness and visual disturbance.  Respiratory: Negative for shortness of breath and wheezing.   Cardiovascular: Negative for chest pain and palpitations.  Gastrointestinal: Negative for nausea and vomiting.  Genitourinary: Negative for dysuria and urgency.  Musculoskeletal: Positive for arthralgias and myalgias.  Skin: Negative for pallor and wound.  Neurological: Negative for dizziness and headaches.       Physical Exam Updated Vital Signs BP (!) 96/40 (BP Location: Left Arm)   Pulse (!) 44   Temp 98.7 F (37.1 C) (Oral)   Resp 20   Ht 5\' 1"  (1.549 m)   Wt 64 kg (141 lb)   SpO2 98%   BMI 26.64 kg/m   Physical Exam  Constitutional: She is oriented to person, place, and time. She appears well-developed and well-nourished. No distress.  HENT:  Head: Normocephalic and atraumatic.  Eyes: EOM are normal. Pupils are equal, round, and reactive to light.  Neck: Normal range of motion. Neck supple.  Cardiovascular: Normal rate and regular rhythm. Exam reveals no gallop and no friction rub.  No murmur heard. Pulmonary/Chest: Effort normal. She has no wheezes. She has no rales.  Abdominal: Soft. She exhibits no distension and no mass. There is no tenderness. There is no guarding.  Musculoskeletal: She exhibits no edema or tenderness.  No pain with internal and external rotation of the left lower extremity.  Pulse motor and sensation is intact the left lower extremity.  She has mild left SI joint tenderness.  No piriformis tenderness.  No abdominal tenderness.  No chest wall tenderness.  Neurological: She is alert and oriented to person, place, and time.  Skin: Skin is warm and dry. She is not diaphoretic.  Psychiatric: She has a normal mood and affect. Her behavior is normal.  Nursing note and vitals reviewed.    ED Treatments / Results  Labs (all labs ordered are listed, but only abnormal results are displayed) Labs Reviewed  CBC WITH DIFFERENTIAL/PLATELET - Abnormal; Notable for the following components:      Result Value   RDW 17.0 (*)    All other components within normal limits  URINALYSIS, ROUTINE W REFLEX MICROSCOPIC - Abnormal; Notable for the following components:   Leukocytes, UA MODERATE (*)    Bacteria, UA RARE (*)    Squamous Epithelial / LPF 0-5 (*)    All other components within normal limits  BASIC METABOLIC PANEL - Abnormal; Notable for the following  components:   Creatinine, Ser 2.02 (*)    GFR calc non Af Amer 22 (*)    GFR calc Af Amer 25 (*)  All other components within normal limits  BASIC METABOLIC PANEL - Abnormal; Notable for the following components:   Chloride 112 (*)    Creatinine, Ser 1.85 (*)    Calcium 8.4 (*)    GFR calc non Af Amer 24 (*)    GFR calc Af Amer 28 (*)    Anion gap 4 (*)    All other components within normal limits  CBC - Abnormal; Notable for the following components:   Hemoglobin 11.0 (*)    HCT 34.0 (*)    RDW 17.0 (*)    All other components within normal limits  I-STAT CHEM 8, ED - Abnormal; Notable for the following components:   Creatinine, Ser 2.10 (*)    Calcium, Ion 1.11 (*)    All other components within normal limits  CORTISOL  GLUCOSE, CAPILLARY  GLUCOSE, CAPILLARY    EKG  EKG Interpretation None       Radiology Dg Lumbar Spine Complete  Result Date: 04/26/2017 CLINICAL DATA:  Lower back pain after fall yesterday. EXAM: LUMBAR SPINE - COMPLETE 4+ VIEW COMPARISON:  Lumbar spine MRI dated July 09, 2012. FINDINGS: Unchanged mild dextroscoliosis. No acute fracture or subluxation. Vertebral body heights are preserved. Straightening of the normal lumbar lordosis. Sagittal alignment is maintained. Mild-to-moderate multilevel disc height loss and endplate spurring from W2-B7 to L5-S1. Moderate right greater than left lower lumbar facet arthropathy. Vascular calcifications. Osteopenia. Prior cholecystectomy. IMPRESSION: Mild-to-moderate multilevel degenerative changes, similar to prior study. No acute osseous abnormality. Electronically Signed   By: Titus Dubin [name]   On: 04/26/2017 12:39   Ct L-spine No Charge  Result Date: 04/26/2017 CLINICAL DATA:  Back pain bilateral flank pain EXAM: CT LUMBAR SPINE WITHOUT CONTRAST TECHNIQUE: Multidetector CT imaging of the lumbar spine was performed without intravenous contrast administration. Multiplanar CT image reconstructions were also  generated. COMPARISON:  MRI lumbar spine 07/09/2012, lumbar radiographs 04/26/2017 FINDINGS: Segmentation: Standard Alignment: Normal alignment. Dextroscoliosis at L2-3. Disc degeneration and spurring on the left at L2-3 and L3-4. Disc degeneration and spurring on the right L5-S1 related to scoliosis Vertebrae: Negative for fracture or mass. Paraspinal and other soft tissues: Atherosclerotic aorta without aneurysm. No retroperitoneal mass Disc levels: L1-2: Diffuse bulging of the disc with facet degeneration. No significant stenosis L2-3: Disc degeneration and spurring on the left with mild left foraminal narrowing. Spinal canal normal in size L3-4: Disc degeneration left greater than right with disc bulging. Severe facet hypertrophy with severe spinal stenosis. This has progressed since the prior MRI. Moderate foraminal stenosis on the left due to spurring L4-5: Diffuse bulging of the disc. Right facet hypertrophy with severe subarticular and foraminal stenosis on the right. L5-S1: Disc degeneration and spurring on the right with moderate right foraminal stenosis. Right-sided facet hypertrophy IMPRESSION: Lumbar scoliosis.  Negative for fracture Mild left foraminal narrowing L2-3 Severe spinal stenosis L3-4 with marked posterior element hypertrophy. Moderate foraminal stenosis on the left Severe subarticular foraminal stenosis on the right at L4-5 Moderate right foraminal stenosis L5-S1. Electronically Signed   By: Franchot Gallo [name]   On: 04/26/2017 16:03   Dg Knee Complete 4 Views Left  Result Date: 04/26/2017 CLINICAL DATA:  [place] from wheelchair yesterday.  Knee pain. EXAM: LEFT KNEE - COMPLETE 4+ VIEW COMPARISON:  None. FINDINGS: No evidence of fracture, dislocation, or joint effusion. No evidence of arthropathy or other focal bone abnormality. Soft tissues are unremarkable. IMPRESSION: Normal Electronically Signed   By: Nelson Chimes [name]   On: 04/26/2017 11:11  Ct Renal Stone Study  Result Date:  04/26/2017 CLINICAL DATA:  Bilateral flank pain. Low back pain after a fall yesterday in charge. EXAM: CT ABDOMEN AND PELVIS WITHOUT CONTRAST TECHNIQUE: Multidetector CT imaging of the abdomen and pelvis was performed following the standard protocol without IV contrast. COMPARISON:  CT scan dated 10/27/2010 FINDINGS: Lower chest: No acute abnormality. Hepatobiliary: There is diffuse increased density of the liver parenchyma, new since the prior study. No focal liver abnormality is seen. Status post cholecystectomy. Common bile duct is slightly distended, within normal limits for postcholecystectomy patient. Pancreas: Unremarkable. No pancreatic ductal dilatation or surrounding inflammatory changes. Spleen: Normal in size without focal abnormality. Adrenals/Urinary Tract: Slight nonspecific haziness around both normal appearing adrenal glands. The kidneys, ureters, and bladder appear normal. Stomach/Bowel: Stomach is within normal limits. Appendix appears normal. No evidence of bowel wall thickening, distention, or inflammatory changes. Vascular/Lymphatic: Aortic atherosclerosis. No enlarged abdominal or pelvic lymph nodes. Reproductive: Uterus and bilateral adnexa are unremarkable. Other: No abdominal wall hernia or abnormality. No abdominopelvic ascites. Musculoskeletal: There is edema in the subcutaneous fat just lateral to the left greater trochanter consistent with soft tissue contusion. There are no fractures. Multilevel degenerative disc and joint disease in the lumbar spine. IMPRESSION: 1. New bilateral haziness around the normal appearing adrenal glands. This could represent adrenal congestion which can be a precursor to adrenal hemorrhage, which can lead to acute adrenal insufficiency. 2. Marked new increased density in the liver which can be seen with hemochromatosis. However, this can also be seen in patients taking amiodarone, developing phospholipidosis, which can be demonstrated on biopsy. This  patient reportedly does take amiodarone. 3. No acute bone abnormality. No renal or ureteral or bladder calculi. 4. Aortic atherosclerosis. Electronically Signed   By: Lorriane Shire [name]   On: 04/26/2017 16:19   Dg Hip Unilat W Or Wo Pelvis 2-3 Views Left  Result Date: 04/26/2017 CLINICAL DATA:  [place] from wheelchair yesterday.  Left hip pain. EXAM: DG HIP (WITH OR WITHOUT PELVIS) 2-3V LEFT COMPARISON:  None. FINDINGS: No evidence of fracture or dislocation. Chronic symphysis degenerative changes. IMPRESSION: No acute or traumatic finding. Electronically Signed   By: Nelson Chimes [name]   On: 04/26/2017 11:10    Procedures Procedures (including critical care time)  Medications Ordered in ED Medications  acetaminophen (TYLENOL) tablet 650 mg (not administered)  albuterol (PROVENTIL) (2.5 MG/3ML) 0.083% nebulizer solution 3 mL (not administered)  ALPRAZolam (XANAX) tablet 0.5 mg (not administered)  amiodarone (PACERONE) tablet 200 mg (not administered)  atorvastatin (LIPITOR) tablet 40 mg (not administered)  loratadine (CLARITIN) tablet 10 mg (not administered)  diltiazem (CARDIZEM CD) 24 hr capsule 120 mg (not administered)  mometasone-formoterol (DULERA) 100-5 MCG/ACT inhaler 2 puff (2 puffs Inhalation Not Given 04/26/17 2328)  insulin glargine (LANTUS) injection 24 Units (24 Units Subcutaneous Given 04/26/17 2254)  meclizine (ANTIVERT) tablet 25 mg (not administered)  fluticasone (FLONASE) 50 MCG/ACT nasal spray 1 spray (1 spray Each Nare Given 04/26/17 2254)  pantoprazole (PROTONIX) EC tablet 40 mg (not administered)  docusate sodium (COLACE) capsule 100 mg (100 mg Oral Given 04/26/17 2254)  methocarbamol (ROBAXIN) tablet 500 mg (500 mg Oral Given 04/26/17 1726)  insulin aspart (novoLOG) injection 0-9 Units (not administered)  insulin aspart (novoLOG) injection 0-5 Units (0 Units Subcutaneous Not Given 04/26/17 2248)  0.9 %  sodium chloride infusion ( Intravenous New Bag/Given 04/27/17  0138)  apixaban (ELIQUIS) tablet 5 mg (5 mg Oral Given 04/26/17 2254)  acetaminophen (TYLENOL) tablet 1,000 mg (  1,000 mg Oral Given 04/26/17 1252)  morphine 4 MG/ML injection 2 mg (2 mg Intramuscular Given 04/26/17 1410)  sodium chloride 0.9 % bolus 1,000 mL (0 mLs Intravenous Stopped 04/26/17 1726)     Initial Impression / Assessment and Plan / ED Course  I have reviewed the triage vital signs and the nursing notes.  Pertinent labs & imaging results that were available during my care of the patient were reviewed by me and considered in my medical decision making (see chart for details).     83 yo F with a chief complaint of left-sided low back pain.  This occurred after a likely mechanical fall.  Plain films done in triage of the hip and the knee are unremarkable.  However the patient's symptoms sound more like sciatica as they initiate in the lower L-spine and then radiate around to the anterior leg.  Will obtain a plain film of the L-spine.  X-rays are unremarkable from prior.  We will attempt to ambulate and then reassess.  I am unable to ambulate the patient.  She has severe pain whenever she tries to bear weight on the left lower extremity.  CT scan ordered with no fracture found.  She does have noted spinal stenosis.  The patient also has a new found to AKI.  As the patient is unable to walk well discussed with the hospitalist.  Will place in obs.   The patients results and plan were reviewed and discussed.   Any x-rays performed were independently reviewed by myself.   Differential diagnosis were considered with the presenting HPI.  Medications  acetaminophen (TYLENOL) tablet 650 mg (not administered)  albuterol (PROVENTIL) (2.5 MG/3ML) 0.083% nebulizer solution 3 mL (not administered)  ALPRAZolam (XANAX) tablet 0.5 mg (not administered)  amiodarone (PACERONE) tablet 200 mg (not administered)  atorvastatin (LIPITOR) tablet 40 mg (not administered)  loratadine (CLARITIN) tablet  10 mg (not administered)  diltiazem (CARDIZEM CD) 24 hr capsule 120 mg (not administered)  mometasone-formoterol (DULERA) 100-5 MCG/ACT inhaler 2 puff (2 puffs Inhalation Not Given 04/26/17 2328)  insulin glargine (LANTUS) injection 24 Units (24 Units Subcutaneous Given 04/26/17 2254)  meclizine (ANTIVERT) tablet 25 mg (not administered)  fluticasone (FLONASE) 50 MCG/ACT nasal spray 1 spray (1 spray Each Nare Given 04/26/17 2254)  pantoprazole (PROTONIX) EC tablet 40 mg (not administered)  docusate sodium (COLACE) capsule 100 mg (100 mg Oral Given 04/26/17 2254)  methocarbamol (ROBAXIN) tablet 500 mg (500 mg Oral Given 04/26/17 1726)  insulin aspart (novoLOG) injection 0-9 Units (not administered)  insulin aspart (novoLOG) injection 0-5 Units (0 Units Subcutaneous Not Given 04/26/17 2248)  0.9 %  sodium chloride infusion ( Intravenous New Bag/Given 04/27/17 0138)  apixaban (ELIQUIS) tablet 5 mg (5 mg Oral Given 04/26/17 2254)  acetaminophen (TYLENOL) tablet 1,000 mg (1,000 mg Oral Given 04/26/17 1252)  morphine 4 MG/ML injection 2 mg (2 mg Intramuscular Given 04/26/17 1410)  sodium chloride 0.9 % bolus 1,000 mL (0 mLs Intravenous Stopped 04/26/17 1726)    Vitals:   04/26/17 2256 04/27/17 0016 04/27/17 0058 04/27/17 0503  BP: (!) 112/45  (!) 118/55 (!) 96/40  Pulse: (!) 43  (!) 49 (!) 44  Resp:   18 20  Temp:   98.3 F (36.8 C) 98.7 F (37.1 C)  TempSrc:   Oral Oral  SpO2:   98% 98%  Weight:  64 kg (141 lb)    Height:  5\' 1"  (1.549 m)      Final diagnoses:  Back pain  Admission/ observation were discussed with the admitting physician, patient and/or family and they are comfortable with the plan.    Final Clinical Impressions(s) / ED Diagnoses   Final diagnoses:  Back pain    ED Discharge Orders    None       Deno Etienne, DO 04/27/17 [id number]

## 2017-04-27 DIAGNOSIS — N183 Chronic kidney disease, stage 3 (moderate): Secondary | ICD-10-CM | POA: Diagnosis not present

## 2017-04-27 DIAGNOSIS — W19XXXS Unspecified fall, sequela: Secondary | ICD-10-CM | POA: Diagnosis not present

## 2017-04-27 DIAGNOSIS — N179 Acute kidney failure, unspecified: Secondary | ICD-10-CM | POA: Diagnosis not present

## 2017-04-27 DIAGNOSIS — M5432 Sciatica, left side: Secondary | ICD-10-CM | POA: Diagnosis not present

## 2017-04-27 DIAGNOSIS — Z794 Long term (current) use of insulin: Secondary | ICD-10-CM | POA: Diagnosis not present

## 2017-04-27 DIAGNOSIS — E1122 Type 2 diabetes mellitus with diabetic chronic kidney disease: Secondary | ICD-10-CM | POA: Diagnosis not present

## 2017-04-27 LAB — BASIC METABOLIC PANEL
Anion gap: 4 — ABNORMAL LOW (ref 5–15)
BUN: 16 mg/dL (ref 6–20)
CO2: 22 mmol/L (ref 22–32)
Calcium: 8.4 mg/dL — ABNORMAL LOW (ref 8.9–10.3)
Chloride: 112 mmol/L — ABNORMAL HIGH (ref 101–111)
Creatinine, Ser: 1.85 mg/dL — ABNORMAL HIGH (ref 0.44–1.00)
GFR calc Af Amer: 28 mL/min — ABNORMAL LOW (ref >60)
GFR calc non Af Amer: 24 mL/min — ABNORMAL LOW (ref >60)
Glucose, Bld: 71 mg/dL (ref 65–99)
Potassium: 4.1 mmol/L (ref 3.5–5.1)
Sodium: 138 mmol/L (ref 135–145)

## 2017-04-27 LAB — CBC
HCT: 34 % — ABNORMAL LOW (ref 36.0–46.0)
Hemoglobin: 11 g/dL — ABNORMAL LOW (ref 12.0–15.0)
MCH: 28 pg (ref 26.0–34.0)
MCHC: 32.4 g/dL (ref 30.0–36.0)
MCV: 86.5 fL (ref 78.0–100.0)
Platelets: 200 10*3/uL (ref 150–400)
RBC: 3.93 MIL/uL (ref 3.87–5.11)
RDW: 17 % — ABNORMAL HIGH (ref 11.5–15.5)
WBC: 5.2 10*3/uL (ref 4.0–10.5)

## 2017-04-27 LAB — GLUCOSE, CAPILLARY
Glucose-Capillary: 72 mg/dL (ref 65–99)
Glucose-Capillary: 92 mg/dL (ref 65–99)

## 2017-04-27 MED ORDER — APIXABAN 2.5 MG PO TABS: 2.5000 mg | ORAL_TABLET | Freq: Two times a day (BID) | ORAL | Status: DC

## 2017-04-27 MED ORDER — METHOCARBAMOL 500 MG PO TABS: 500.0000 mg | ORAL_TABLET | Freq: Four times a day (QID) | ORAL | 0 refills | Status: DC | PRN

## 2017-04-27 MED ORDER — APIXABAN 2.5 MG PO TABS: 2.5000 mg | ORAL_TABLET | Freq: Two times a day (BID) | ORAL | 0 refills | Status: DC

## 2017-04-27 NOTE — Progress Notes (Signed)
Pt discharging at this time with husband taking all personal belongings. IV discontinued, dry dressing applied. Discharge instructions provided with verbal understanding. Pt will follow up with MD. No noted distress.

## 2017-04-27 NOTE — Discharge Instructions (Signed)

## 2017-04-27 NOTE — Evaluation (Signed)
Physical Therapy Evaluation/Discharge Patient Details Name: Kerry Barnes MRN: [id number] DOB: [birth year] Today's Date: 04/27/2017   History of Present Illness  83 y.o. female presenting with left low back, hip, and knee pain after fall. Pelvis, hip, knee X-rays negative for fractures/dislocations. L-spine CT shows mild degenreative changes L2-S1 and severe stenosis L3/4.  PMH significant of A-fib, fibromyalgia, DM, CHF, TIA.   Clinical Impression  Pt presents with decreased mobility and balance. Pt tolerated ambulation well with no c/o pain or SOB. Pt and pt's husband report that pt's current mobility status matches her baseline. Pt reports modified independence at home, using increased time and assistive devices for safety with mobility and ADLs. Pt does not require further skilled PT. Discharge from therapy.    Follow Up Recommendations No PT follow up;Supervision - Intermittent    Equipment Recommendations  None recommended by PT    Recommendations for Other Services       Precautions / Restrictions Restrictions Weight Bearing Restrictions: No      Mobility  Bed Mobility               General bed mobility comments: pt OOB and in chair at my arrival.   Transfers Overall transfer level: Needs assistance Equipment used: Rolling walker (2 wheeled) Transfers: Sit to/from Stand Sit to Stand: Supervision         General transfer comment: sit-to stand x2 with verbal cueing to descend slowly into the chair.   Ambulation/Gait Ambulation/Gait assistance: Supervision;Min guard Ambulation Distance (Feet): 500 Feet Assistive device: Rolling walker (2 wheeled) Gait Pattern/deviations: Step-through pattern;WFL(Within Functional Limits);Trunk flexed Gait velocity: decreased   General Gait Details: min guard for safety  Stairs            Wheelchair Mobility    Modified Rankin (Stroke Patients Only)       Balance Overall balance assessment: Needs  assistance Sitting-balance support: Feet supported;No upper extremity supported Sitting balance-Leahy Scale: Good Sitting balance - Comments: Pt able to use both UEs freely while seated in chair.   Standing balance support: During functional activity;No upper extremity supported Standing balance-Leahy Scale: Fair Standing balance comment: Pt demsontrates ability to stand and shift weight without UEs on RW, however, pt utilizes RW for safety.                             Pertinent Vitals/Pain Pain Assessment: No/denies pain    Home Living Family/patient expects to be discharged to:: Private residence Living Arrangements: Spouse/significant other Available Help at Discharge: Family Type of Home: House Home Access: Level entry     Home Layout: One level Home Equipment: Environmental consultant - 2 wheels;Walker - 4 wheels;Cane - single point;Grab bars - tub/shower;Shower seat      Prior Function Level of Independence: Independent with assistive device(s)         Comments: Pt utilizes cane intermittently to get around home and community.     Hand Dominance        Extremity/Trunk Assessment   Upper Extremity Assessment Upper Extremity Assessment: Defer to OT evaluation    Lower Extremity Assessment Lower Extremity Assessment: Overall WFL for tasks assessed(L1-L4 myotomes 4/5 bilaterally)    Cervical / Trunk Assessment Cervical / Trunk Assessment: Normal  Communication   Communication: No difficulties  Cognition Arousal/Alertness: Awake/alert Behavior During Therapy: WFL for tasks assessed/performed Overall Cognitive Status: Within Functional Limits for tasks assessed  General Comments General comments (skin integrity, edema, etc.): Pt's husband in room and reports her current mobility status is her baseline.     Exercises     Assessment/Plan    PT Assessment Patent does not need any further PT services  PT  Problem List         PT Treatment Interventions      PT Goals (Current goals can be found in the Care Plan section)  Acute Rehab PT Goals Patient Stated Goal: return home PT Goal Formulation: With patient Time For Goal Achievement: 05/11/17 Potential to Achieve Goals: Good    Frequency     Barriers to discharge        Co-evaluation               AM-PAC PT "6 Clicks" Daily Activity  Outcome Measure Difficulty turning over in bed (including adjusting bedclothes, sheets and blankets)?: None Difficulty moving from lying on back to sitting on the side of the bed? : None Difficulty sitting down on and standing up from a chair with arms (e.g., wheelchair, bedside commode, etc,.)?: A Little Help needed moving to and from a bed to chair (including a wheelchair)?: A Little Help needed walking in hospital room?: A Little Help needed climbing 3-5 steps with a railing? : A Lot 6 Click Score: 19    End of Session Equipment Utilized During Treatment: Gait belt Activity Tolerance: Patient tolerated treatment well Patient left: in chair;with family/visitor present;with call bell/phone within reach   PT Visit Diagnosis: Difficulty in walking, not elsewhere classified (R26.2)    Time: 7867-6720 PT Time Calculation (min) (ACUTE ONLY): 18 min   Charges:   PT Evaluation $PT Eval Low Complexity: 1 Low     PT G Codes:   PT G-Codes **NOT FOR INPATIENT CLASS** Functional Assessment Tool Used: Clinical judgement Functional Limitation: Mobility: Walking and moving around Mobility: Walking and Moving Around Current Status (N4709): At least 1 percent but less than 20 percent impaired, limited or restricted Mobility: Walking and Moving Around Goal Status [phone]): 0 percent impaired, limited or restricted Mobility: Walking and Moving Around Discharge Status [phone]): 0 percent impaired, limited or restricted    Judee Clara, SPT  Judee Clara 04/27/2017, 11:56 AM

## 2017-04-27 NOTE — Evaluation (Signed)
Occupational Therapy Evaluation and Discharge Patient Details Name: Kerry Barnes MRN: [id number] DOB: 09/16/1933 Today's Date: 04/27/2017    History of Present Illness 83 y.o. female presenting with left low back, hip, and knee pain after fall. Pelvis, hip, knee X-rays negative for fractures/dislocations. L-spine CT shows mild degenreative changes L2-S1 and severe stenosis L3/4.  PMH significant of A-fib, fibromyalgia, DM, CHF, TIA.    Clinical Impression   This 83 yo female admitted with above presents to acute OT at an overall S level and has this from her husband at home. No further OT needs, we will sign off.    Follow Up Recommendations  No OT follow up    Equipment Recommendations  None recommended by OT       Precautions / Restrictions Restrictions Weight Bearing Restrictions: No      Mobility Bed Mobility Overal bed mobility: Independent              Transfers Overall transfer level: Needs assistance Equipment used: None Transfers: Sit to/from Stand Sit to Stand: Supervision             Balance Overall balance assessment: Needs assistance Sitting-balance support: Feet supported;No upper extremity supported Sitting balance-Leahy Scale: Good Sitting balance - Comments: Pt able to use both UEs freely while seated in chair.   Standing balance support: During functional activity;No upper extremity supported Standing balance-Leahy Scale: Fair Standing balance comment: Pt occassionally reached for items in room while walking (vanity and end of bed), but overall was stable                           ADL either performed or assessed with clinical judgement   ADL                                         General ADL Comments: Overall at a S level due to recent falls. I did recommend to pt and husband that pt use shower seat that she has for safety due to recent falls     Vision Patient Visual Report: No change from  baseline              Pertinent Vitals/Pain Pain Assessment: No/denies pain     Hand Dominance Right   Extremity/Trunk Assessment Upper Extremity Assessment Upper Extremity Assessment: Overall WFL for tasks assessed     Communication Communication Communication: No difficulties   Cognition Arousal/Alertness: Awake/alert Behavior During Therapy: WFL for tasks assessed/performed Overall Cognitive Status: Within Functional Limits for tasks assessed                                                Home Living Family/patient expects to be discharged to:: Private residence Living Arrangements: Spouse/significant other Available Help at Discharge: Family Type of Home: House Home Access: Level entry     Home Layout: One level     Bathroom Shower/Tub: Teacher, early years/pre: Handicapped height Bathroom Accessibility: Yes   Home Equipment: Environmental consultant - 2 wheels;Walker - 4 wheels;Cane - single point;Grab bars - tub/shower;Shower seat          Prior Functioning/Environment Level of Independence: Independent with assistive device(s)        Comments: Pt  utilizes cane intermittently to get around home and community.                 OT Goals(Current goals can be found in the care plan section) Acute Rehab OT Goals Patient Stated Goal: return home  OT Frequency:                AM-PAC PT "6 Clicks" Daily Activity     Outcome Measure Help from another person eating meals?: None Help from another person taking care of personal grooming?: None Help from another person toileting, which includes using toliet, bedpan, or urinal?: None Help from another person bathing (including washing, rinsing, drying)?: None Help from another person to put on and taking off regular upper body clothing?: None Help from another person to put on and taking off regular lower body clothing?: None 6 Click Score: 24   End of Session Equipment Utilized During  Treatment: Gait belt Nurse Communication: Mobility status(OK'd not to use chair alarm due to not working properly)  Activity Tolerance: Patient tolerated treatment well Patient left: in chair;with call bell/phone within reach;with family/visitor present  OT Visit Diagnosis: History of falling (Z91.81)                Time: 7517-0017 OT Time Calculation (min): 29 min Charges:  OT General Charges $OT Visit: 1 Visit OT Evaluation $OT Eval Moderate Complexity: 1 Mod OT Treatments $Self Care/Home Management : 8-22 mins G-Codes: OT G-codes **NOT FOR INPATIENT CLASS** Functional Assessment Tool Used: Clinical judgement Functional Limitation: Self care Self Care Current Status (C9449): At least 1 percent but less than 20 percent impaired, limited or restricted Self Care Goal Status (Q7591): At least 1 percent but less than 20 percent impaired, limited or restricted Self Care Discharge Status [phone]): At least 1 percent but less than 20 percent impaired, limited or restricted   Golden Circle, OTR/L [phone] 04/27/2017

## 2017-04-27 NOTE — Care Management Note (Signed)
Case Management Note  Patient Details  Name: Kerry Barnes MRN: [id number] Date of Birth: 1934/03/21  Subjective/Objective:  Pt in with AKI. She is from home with her spouse.                  Action/Plan: Pt discharging home with self care. Pt has PCP, insurance and transportation home. No further needs per CM.  Expected Discharge Date:  04/27/17               Expected Discharge Plan:  Home/Self Care  In-House Referral:     Discharge planning Services     Post Acute Care Choice:    Choice offered to:     DME Arranged:    DME Agency:     HH Arranged:    HH Agency:     Status of Service:  Completed, signed off  If discussed at [employer] of Stay Meetings, dates discussed:    Additional Comments:  Pollie Friar, RN 04/27/2017, 3:23 PM

## 2017-04-27 NOTE — Discharge Summary (Signed)
Physician Discharge Summary  Kerry Barnes WRU:[id number] DOB: 03-May-1934 DOA: 04/26/2017  PCP: Aletha Halim., PA-C  Admit date: 04/26/2017 Discharge date: 04/27/2017   Recommendations for Outpatient Follow-Up:   1. Monitor Cr-- eliquis dose adjusted by pharmacy for age/weight/Cr cl 2. Follow up liver/adreanal findings on CT scan   Discharge Diagnosis:   Active Problems:   Type 2 diabetes with stage 3 chronic kidney disease GFR 30-59 (HCC)   Leg pain   AKI (acute kidney injury) [facility])   Discharge disposition:  Home  Discharge Condition: Improved.  Diet recommendation: Low sodium, heart healthy.  Carbohydrate-modified  Wound care: None.   History of Present Illness:   Kerry Barnes is a 83 y.o. female with medical history significant of a fib, IBS, fibromyalgia, DM who lives at home with her husband.  She has had several fall recently, last one was yesterday at church.  She had left back, hip and knee pain.  She c/o her left leg "giving out".  Her pain is described as a 9/10 but not sharp nor radiating but goes all the way down to her left ankle.  No bowel or bladder incontinence.  No fever or chills.  No chest pain or shortness of breath.       Hospital Course by Problem:   Leg pain due to sciatica after mechanical fall -no fractures on x rays/CT scans -robaxin PRN -heat -PT eval-- no follow up back to baseline  AKI -gentle IVF overnight -improved, follow with PCP  DM -resume home meds   fibromyalgia -resume home meds  H/o a fib -continue NOAC      Medical Consultants:    None.   Discharge Exam:   Vitals:   04/27/17 1006 04/27/17 1442  BP: (!) 148/41 (!) 113/48  Pulse: (!) 57 (!) 50  Resp: 20 19  Temp: 98.7 F (37.1 C) 98.8 F (37.1 C)  SpO2: 99% 99%   Vitals:   04/27/17 0503 04/27/17 0813 04/27/17 1006 04/27/17 1442  BP: (!) 96/40 (!) 122/53 (!) 148/41 (!) 113/48  Pulse: (!) 44 (!) 54 (!) 57 (!) 50  Resp: 20  18 20 19   Temp: 98.7 F (37.1 C) 98.7 F (37.1 C) 98.7 F (37.1 C) 98.8 F (37.1 C)  TempSrc: Oral Oral  Oral  SpO2: 98% 99% 99% 99%  Weight:      Height:        Gen:  NAD- feeling much better   The results of significant diagnostics from this hospitalization (including imaging, microbiology, ancillary and laboratory) are listed below for reference.     Procedures and Diagnostic Studies:   Dg Lumbar Spine Complete  Result Date: 04/26/2017 CLINICAL DATA:  Lower back pain after fall yesterday. EXAM: LUMBAR SPINE - COMPLETE 4+ VIEW COMPARISON:  Lumbar spine MRI dated July 09, 2012. FINDINGS: Unchanged mild dextroscoliosis. No acute fracture or subluxation. Vertebral body heights are preserved. Straightening of the normal lumbar lordosis. Sagittal alignment is maintained. Mild-to-moderate multilevel disc height loss and endplate spurring from B1-Y7 to L5-S1. Moderate right greater than left lower lumbar facet arthropathy. Vascular calcifications. Osteopenia. Prior cholecystectomy. IMPRESSION: Mild-to-moderate multilevel degenerative changes, similar to prior study. No acute osseous abnormality. Electronically Signed   By: Titus Dubin [name]   On: 04/26/2017 12:39   Ct L-spine No Charge  Result Date: 04/26/2017 CLINICAL DATA:  Back pain bilateral flank pain EXAM: CT LUMBAR SPINE WITHOUT CONTRAST TECHNIQUE: Multidetector CT imaging of the lumbar spine was performed without intravenous contrast  administration. Multiplanar CT image reconstructions were also generated. COMPARISON:  MRI lumbar spine 07/09/2012, lumbar radiographs 04/26/2017 FINDINGS: Segmentation: Standard Alignment: Normal alignment. Dextroscoliosis at L2-3. Disc degeneration and spurring on the left at L2-3 and L3-4. Disc degeneration and spurring on the right L5-S1 related to scoliosis Vertebrae: Negative for fracture or mass. Paraspinal and other soft tissues: Atherosclerotic aorta without aneurysm. No retroperitoneal mass  Disc levels: L1-2: Diffuse bulging of the disc with facet degeneration. No significant stenosis L2-3: Disc degeneration and spurring on the left with mild left foraminal narrowing. Spinal canal normal in size L3-4: Disc degeneration left greater than right with disc bulging. Severe facet hypertrophy with severe spinal stenosis. This has progressed since the prior MRI. Moderate foraminal stenosis on the left due to spurring L4-5: Diffuse bulging of the disc. Right facet hypertrophy with severe subarticular and foraminal stenosis on the right. L5-S1: Disc degeneration and spurring on the right with moderate right foraminal stenosis. Right-sided facet hypertrophy IMPRESSION: Lumbar scoliosis.  Negative for fracture Mild left foraminal narrowing L2-3 Severe spinal stenosis L3-4 with marked posterior element hypertrophy. Moderate foraminal stenosis on the left Severe subarticular foraminal stenosis on the right at L4-5 Moderate right foraminal stenosis L5-S1. Electronically Signed   By: Franchot Gallo [name]   On: 04/26/2017 16:03   Dg Knee Complete 4 Views Left  Result Date: 04/26/2017 CLINICAL DATA:  [place] from wheelchair yesterday.  Knee pain. EXAM: LEFT KNEE - COMPLETE 4+ VIEW COMPARISON:  None. FINDINGS: No evidence of fracture, dislocation, or joint effusion. No evidence of arthropathy or other focal bone abnormality. Soft tissues are unremarkable. IMPRESSION: Normal Electronically Signed   By: Nelson Chimes [name]   On: 04/26/2017 11:11   Ct Renal Stone Study  Result Date: 04/26/2017 CLINICAL DATA:  Bilateral flank pain. Low back pain after a fall yesterday in charge. EXAM: CT ABDOMEN AND PELVIS WITHOUT CONTRAST TECHNIQUE: Multidetector CT imaging of the abdomen and pelvis was performed following the standard protocol without IV contrast. COMPARISON:  CT scan dated 10/27/2010 FINDINGS: Lower chest: No acute abnormality. Hepatobiliary: There is diffuse increased density of the liver parenchyma, new since the  prior study. No focal liver abnormality is seen. Status post cholecystectomy. Common bile duct is slightly distended, within normal limits for postcholecystectomy patient. Pancreas: Unremarkable. No pancreatic ductal dilatation or surrounding inflammatory changes. Spleen: Normal in size without focal abnormality. Adrenals/Urinary Tract: Slight nonspecific haziness around both normal appearing adrenal glands. The kidneys, ureters, and bladder appear normal. Stomach/Bowel: Stomach is within normal limits. Appendix appears normal. No evidence of bowel wall thickening, distention, or inflammatory changes. Vascular/Lymphatic: Aortic atherosclerosis. No enlarged abdominal or pelvic lymph nodes. Reproductive: Uterus and bilateral adnexa are unremarkable. Other: No abdominal wall hernia or abnormality. No abdominopelvic ascites. Musculoskeletal: There is edema in the subcutaneous fat just lateral to the left greater trochanter consistent with soft tissue contusion. There are no fractures. Multilevel degenerative disc and joint disease in the lumbar spine. IMPRESSION: 1. New bilateral haziness around the normal appearing adrenal glands. This could represent adrenal congestion which can be a precursor to adrenal hemorrhage, which can lead to acute adrenal insufficiency. 2. Marked new increased density in the liver which can be seen with hemochromatosis. However, this can also be seen in patients taking amiodarone, developing phospholipidosis, which can be demonstrated on biopsy. This patient reportedly does take amiodarone. 3. No acute bone abnormality. No renal or ureteral or bladder calculi. 4. Aortic atherosclerosis. Electronically Signed   By: Lorriane Shire [name]  On: 04/26/2017 16:19   Dg Hip Unilat W Or Wo Pelvis 2-3 Views Left  Result Date: 04/26/2017 CLINICAL DATA:  [place] from wheelchair yesterday.  Left hip pain. EXAM: DG HIP (WITH OR WITHOUT PELVIS) 2-3V LEFT COMPARISON:  None. FINDINGS: No evidence of fracture  or dislocation. Chronic symphysis degenerative changes. IMPRESSION: No acute or traumatic finding. Electronically Signed   By: Nelson Chimes [name]   On: 04/26/2017 11:10     Labs:   Basic Metabolic Panel: Recent Labs  Lab 04/26/17 1313 04/26/17 1329 04/27/17 0234  NA 138 140 138  K 4.8 4.6 4.1  CL 107 106 112*  CO2 24  --  22  GLUCOSE 77 85 71  BUN 18 20 16   CREATININE 2.02* 2.10* 1.85*  CALCIUM 9.3  --  8.4*   GFR Estimated Creatinine Clearance: 20.4 mL/min (A) (by C-G formula based on SCr of 1.85 mg/dL (H)). Liver Function Tests: No results for input(s): AST, ALT, ALKPHOS, BILITOT, PROT, ALBUMIN in the last 168 hours. No results for input(s): LIPASE, AMYLASE in the last 168 hours. No results for input(s): AMMONIA in the last 168 hours. Coagulation profile No results for input(s): INR, PROTIME in the last 168 hours.  CBC: Recent Labs  Lab 04/26/17 1315 04/26/17 1329 04/27/17 0234  WBC 6.0  --  5.2  NEUTROABS 3.3  --   --   HGB 13.0 13.6 11.0*  HCT 39.9 40.0 34.0*  MCV 86.0  --  86.5  PLT 215  --  200   Cardiac Enzymes: No results for input(s): CKTOTAL, CKMB, CKMBINDEX, TROPONINI in the last 168 hours. BNP: Invalid input(s): POCBNP CBG: Recent Labs  Lab 04/26/17 2053 04/27/17 0615 04/27/17 1153  GLUCAP 95 72 92   D-Dimer No results for input(s): DDIMER in the last 72 hours. Hgb A1c No results for input(s): HGBA1C in the last 72 hours. Lipid Profile No results for input(s): CHOL, HDL, LDLCALC, TRIG, CHOLHDL, LDLDIRECT in the last 72 hours. Thyroid function studies No results for input(s): TSH, T4TOTAL, T3FREE, THYROIDAB in the last 72 hours.  Invalid input(s): FREET3 Anemia work up No results for input(s): VITAMINB12, FOLATE, FERRITIN, TIBC, IRON, RETICCTPCT in the last 72 hours. Microbiology No results found for this or any previous visit (from the past 240 hour(s)).   Discharge Instructions:   Discharge Instructions    Diet - low sodium heart  healthy   Complete by:  As directed    Diet Carb Modified   Complete by:  As directed    Increase activity slowly   Complete by:  As directed      Allergies as of 04/27/2017      Reactions   Codeine Nausea Only   Metformin Diarrhea   Amoxicillin Rash   Biaxin [clarithromycin] Rash   Ciprofloxacin Rash   Penicillins Rash   Childhood allergic reaction Has patient had a PCN reaction causing immediate rash, facial/tongue/throat swelling, SOB or lightheadedness with hypotension: Yes Has patient had a PCN reaction causing severe rash involving mucus membranes or skin necrosis: No Has patient had a PCN reaction that required hospitalization: Yes Has patient had a PCN reaction occurring within the last 10 years: No If all of the above answers are "NO", then may proceed with Cephalosporin use.      Medication List    STOP taking these medications   diltiazem 120 MG 24 hr capsule Commonly known as:  CARDIZEM CD     TAKE these medications   acetaminophen 325 MG  tablet Commonly known as:  TYLENOL Take 2 tablets (650 mg total) by mouth every 4 (four) hours as needed for headache or mild pain.   albuterol 108 (90 Base) MCG/ACT inhaler Commonly known as:  PROVENTIL HFA;VENTOLIN HFA Inhale into the lungs every 6 (six) hours as needed for wheezing or shortness of breath.   ALPRAZolam 0.5 MG tablet Commonly known as:  XANAX Take 0.5 mg by mouth 2 (two) times daily as needed for anxiety.   amiodarone 200 MG tablet Commonly known as:  PACERONE Take 1 tablet (200 mg total) by mouth daily.   apixaban 5 MG Tabs tablet Commonly known as:  ELIQUIS Take 1 tablet by mouth twice daily, please schedule MD appt for further refills What changed:    how much to take  how to take this  when to take this  additional instructions   atorvastatin 40 MG tablet Commonly known as:  LIPITOR Take 40 mg by mouth daily.   cetirizine 10 MG tablet Commonly known as:  ZYRTEC Take 10 mg by mouth  daily.   diltiazem 60 MG 12 hr capsule Commonly known as:  CARDIZEM SR Take 60 mg daily by mouth.   EPINEPHrine 0.3 mg/0.3 mL Soaj injection Commonly known as:  EPI-PEN Inject 0.3 mg once as needed into the muscle (severe allergic reaction).   Fluticasone-Salmeterol 100-50 MCG/DOSE Aepb Commonly known as:  ADVAIR Inhale 1 puff into the lungs every 12 (twelve) hours.   LANTUS SOLOSTAR 100 UNIT/ML Solostar Pen Generic drug:  Insulin Glargine Inject 18 Units daily before breakfast into the skin.   linaclotide 290 MCG Caps capsule Commonly known as:  LINZESS Take 1 capsule (290 mcg total) by mouth daily before breakfast.   meclizine 25 MG tablet Commonly known as:  ANTIVERT Take 25 mg by mouth 3 (three) times daily as needed for dizziness.   methocarbamol 500 MG tablet Commonly known as:  ROBAXIN Take 1 tablet (500 mg total) every 6 (six) hours as needed by mouth for muscle spasms.   mometasone 50 MCG/ACT nasal spray Commonly known as:  NASONEX Place 2 sprays into the nose daily.   montelukast 10 MG tablet Commonly known as:  SINGULAIR Take 10 mg at bedtime by mouth.   olopatadine 0.1 % ophthalmic solution Commonly known as:  PATANOL Place 1 drop into both eyes 2 (two) times daily.   pantoprazole 40 MG tablet Commonly known as:  PROTONIX Take 1 tablet (40 mg total) by mouth daily.   pregabalin 75 MG capsule Commonly known as:  LYRICA Take 75 mg by mouth 2 (two) times daily.   ranitidine 150 MG capsule Commonly known as:  ZANTAC TAKE 1 CAPSULE(150 MG) BY MOUTH EVERY EVENING   SYSTANE BALANCE OP Place 1 drop daily as needed into both eyes (dryness).   traMADol 50 MG tablet Commonly known as:  ULTRAM Take 50 mg every 12 (twelve) hours as needed by mouth (pain).      Follow-up Information    Aletha Halim., PA-C Follow up in 1 week(s).   Specialty:  Family Medicine Contact information: [address] [place] [address] [phone]             Time coordinating discharge: 35 min  Signed:  Graylon Amory Alison Stalling   [facility] 04/27/2017, 2:47 PM

## 2017-04-27 NOTE — Care Management Obs Status (Signed)
[place] NOTIFICATION   Patient Details  Name: Kerry Barnes MRN: [id number] Date of Birth: 1933-11-27   Medicare Observation Status Notification Given:  Yes    Pollie Friar, RN 04/27/2017, 2:58 PM

## 2017-04-28 ENCOUNTER — Other Ambulatory Visit: Payer: Self-pay | Admitting: Internal Medicine

## 2017-04-29 DIAGNOSIS — E119 Type 2 diabetes mellitus without complications: Secondary | ICD-10-CM | POA: Diagnosis not present

## 2017-04-29 DIAGNOSIS — Z794 Long term (current) use of insulin: Secondary | ICD-10-CM | POA: Diagnosis not present

## 2017-05-05 DIAGNOSIS — M25552 Pain in left hip: Secondary | ICD-10-CM | POA: Diagnosis not present

## 2017-05-05 DIAGNOSIS — Z09 Encounter for follow-up examination after completed treatment for conditions other than malignant neoplasm: Secondary | ICD-10-CM | POA: Diagnosis not present

## 2017-05-05 DIAGNOSIS — R932 Abnormal findings on diagnostic imaging of liver and biliary tract: Secondary | ICD-10-CM | POA: Diagnosis not present

## 2017-05-05 DIAGNOSIS — E279 Disorder of adrenal gland, unspecified: Secondary | ICD-10-CM | POA: Diagnosis not present

## 2017-05-05 DIAGNOSIS — L89319 Pressure ulcer of right buttock, unspecified stage: Secondary | ICD-10-CM | POA: Diagnosis not present

## 2017-05-11 DIAGNOSIS — Z8673 Personal history of transient ischemic attack (TIA), and cerebral infarction without residual deficits: Secondary | ICD-10-CM | POA: Diagnosis not present

## 2017-05-11 DIAGNOSIS — I4892 Unspecified atrial flutter: Secondary | ICD-10-CM | POA: Diagnosis not present

## 2017-05-11 DIAGNOSIS — E7800 Pure hypercholesterolemia, unspecified: Secondary | ICD-10-CM | POA: Diagnosis not present

## 2017-05-11 DIAGNOSIS — I10 Essential (primary) hypertension: Secondary | ICD-10-CM | POA: Diagnosis not present

## 2017-05-12 ENCOUNTER — Encounter (INDEPENDENT_AMBULATORY_CARE_PROVIDER_SITE_OTHER): Payer: PPO | Admitting: Ophthalmology

## 2017-05-12 DIAGNOSIS — I10 Essential (primary) hypertension: Secondary | ICD-10-CM | POA: Diagnosis not present

## 2017-05-12 DIAGNOSIS — E11311 Type 2 diabetes mellitus with unspecified diabetic retinopathy with macular edema: Secondary | ICD-10-CM

## 2017-05-12 DIAGNOSIS — H43813 Vitreous degeneration, bilateral: Secondary | ICD-10-CM

## 2017-05-12 DIAGNOSIS — E113212 Type 2 diabetes mellitus with mild nonproliferative diabetic retinopathy with macular edema, left eye: Secondary | ICD-10-CM | POA: Diagnosis not present

## 2017-05-12 DIAGNOSIS — H35033 Hypertensive retinopathy, bilateral: Secondary | ICD-10-CM

## 2017-05-12 DIAGNOSIS — H34812 Central retinal vein occlusion, left eye, with macular edema: Secondary | ICD-10-CM

## 2017-06-10 DIAGNOSIS — R32 Unspecified urinary incontinence: Secondary | ICD-10-CM | POA: Diagnosis not present

## 2017-06-10 DIAGNOSIS — N390 Urinary tract infection, site not specified: Secondary | ICD-10-CM | POA: Diagnosis not present

## 2017-07-19 DIAGNOSIS — I10 Essential (primary) hypertension: Secondary | ICD-10-CM | POA: Diagnosis not present

## 2017-07-19 DIAGNOSIS — E7800 Pure hypercholesterolemia, unspecified: Secondary | ICD-10-CM | POA: Diagnosis not present

## 2017-07-19 DIAGNOSIS — I951 Orthostatic hypotension: Secondary | ICD-10-CM | POA: Diagnosis not present

## 2017-07-19 DIAGNOSIS — I4892 Unspecified atrial flutter: Secondary | ICD-10-CM | POA: Diagnosis not present

## 2017-07-22 ENCOUNTER — Other Ambulatory Visit: Payer: Self-pay | Admitting: Pharmacist Clinician (PhC)/ Clinical Pharmacy Specialist

## 2017-07-22 MED ORDER — APIXABAN 2.5 MG PO TABS: ORAL_TABLET | ORAL | 0 refills | Status: DC

## 2017-07-27 ENCOUNTER — Other Ambulatory Visit: Payer: Self-pay | Admitting: Family Medicine

## 2017-07-27 DIAGNOSIS — R932 Abnormal findings on diagnostic imaging of liver and biliary tract: Secondary | ICD-10-CM

## 2017-07-27 DIAGNOSIS — E279 Disorder of adrenal gland, unspecified: Secondary | ICD-10-CM

## 2017-07-28 ENCOUNTER — Encounter (INDEPENDENT_AMBULATORY_CARE_PROVIDER_SITE_OTHER): Payer: PPO | Admitting: Ophthalmology

## 2017-07-28 DIAGNOSIS — H43813 Vitreous degeneration, bilateral: Secondary | ICD-10-CM | POA: Diagnosis not present

## 2017-07-28 DIAGNOSIS — E11311 Type 2 diabetes mellitus with unspecified diabetic retinopathy with macular edema: Secondary | ICD-10-CM | POA: Diagnosis not present

## 2017-07-28 DIAGNOSIS — I10 Essential (primary) hypertension: Secondary | ICD-10-CM

## 2017-07-28 DIAGNOSIS — E113312 Type 2 diabetes mellitus with moderate nonproliferative diabetic retinopathy with macular edema, left eye: Secondary | ICD-10-CM

## 2017-07-28 DIAGNOSIS — E113291 Type 2 diabetes mellitus with mild nonproliferative diabetic retinopathy without macular edema, right eye: Secondary | ICD-10-CM | POA: Diagnosis not present

## 2017-07-28 DIAGNOSIS — H34812 Central retinal vein occlusion, left eye, with macular edema: Secondary | ICD-10-CM

## 2017-07-28 DIAGNOSIS — H35033 Hypertensive retinopathy, bilateral: Secondary | ICD-10-CM

## 2017-08-04 ENCOUNTER — Other Ambulatory Visit: Payer: Self-pay | Admitting: Internal Medicine

## 2017-08-05 ENCOUNTER — Telehealth: Payer: Self-pay | Admitting: *Deleted

## 2017-08-05 NOTE — Telephone Encounter (Signed)
error 

## 2017-08-10 DIAGNOSIS — E1142 Type 2 diabetes mellitus with diabetic polyneuropathy: Secondary | ICD-10-CM | POA: Diagnosis not present

## 2017-08-10 DIAGNOSIS — I10 Essential (primary) hypertension: Secondary | ICD-10-CM | POA: Diagnosis not present

## 2017-08-10 DIAGNOSIS — G609 Hereditary and idiopathic neuropathy, unspecified: Secondary | ICD-10-CM | POA: Diagnosis not present

## 2017-08-10 DIAGNOSIS — E7800 Pure hypercholesterolemia, unspecified: Secondary | ICD-10-CM | POA: Diagnosis not present

## 2017-08-11 ENCOUNTER — Ambulatory Visit
Admission: RE | Admit: 2017-08-11 | Discharge: 2017-08-11 | Disposition: A | Discharge status: Home / Self Care | Payer: PPO | Source: Ambulatory Visit | Attending: Family Medicine | Admitting: Family Medicine

## 2017-08-11 DIAGNOSIS — R7989 Other specified abnormal findings of blood chemistry: Secondary | ICD-10-CM | POA: Diagnosis not present

## 2017-08-11 DIAGNOSIS — R932 Abnormal findings on diagnostic imaging of liver and biliary tract: Secondary | ICD-10-CM

## 2017-08-11 DIAGNOSIS — E279 Disorder of adrenal gland, unspecified: Secondary | ICD-10-CM

## 2017-08-14 ENCOUNTER — Other Ambulatory Visit: Payer: Self-pay | Admitting: Internal Medicine

## 2017-08-16 ENCOUNTER — Other Ambulatory Visit: Payer: Self-pay | Admitting: *Deleted

## 2017-08-16 DIAGNOSIS — I484 Atypical atrial flutter: Secondary | ICD-10-CM

## 2017-08-16 DIAGNOSIS — I10 Essential (primary) hypertension: Secondary | ICD-10-CM

## 2017-08-16 MED ORDER — AMIODARONE HCL 200 MG PO TABS: 200.0000 mg | ORAL_TABLET | Freq: Every day | ORAL | 0 refills | Status: DC

## 2017-08-25 DIAGNOSIS — N898 Other specified noninflammatory disorders of vagina: Secondary | ICD-10-CM | POA: Diagnosis not present

## 2017-08-25 DIAGNOSIS — R350 Frequency of micturition: Secondary | ICD-10-CM | POA: Diagnosis not present

## 2017-08-25 DIAGNOSIS — R82998 Other abnormal findings in urine: Secondary | ICD-10-CM | POA: Diagnosis not present

## 2017-09-06 ENCOUNTER — Ambulatory Visit: Payer: PPO | Admitting: Podiatry

## 2017-09-06 ENCOUNTER — Encounter: Payer: Self-pay | Admitting: Podiatry

## 2017-09-06 DIAGNOSIS — D689 Coagulation defect, unspecified: Secondary | ICD-10-CM | POA: Diagnosis not present

## 2017-09-06 DIAGNOSIS — M79674 Pain in right toe(s): Secondary | ICD-10-CM | POA: Diagnosis not present

## 2017-09-06 DIAGNOSIS — M79675 Pain in left toe(s): Secondary | ICD-10-CM | POA: Diagnosis not present

## 2017-09-06 DIAGNOSIS — B351 Tinea unguium: Secondary | ICD-10-CM | POA: Diagnosis not present

## 2017-09-08 NOTE — Progress Notes (Signed)
Subjective: 84 y.o. returns the office today for painful, elongated, thickened toenails which she cannot trim herself. Denies any redness or drainage around the nails.  She also has some dry skin to her heels but denies any skin fissuring or any open sores. Denies any acute changes since last appointment and no new complaints today. Denies any systemic complaints such as fevers, chills, nausea, vomiting.   PCP: Aletha Halim., PA-C  She is on eliquis   Objective: AAO 3, NAD DP/PT pulses palpable, CRT less than 3 seconds Nails hypertrophic, dystrophic, elongated, brittle, discolored 10. There is tenderness overlying the nails 1-5 bilaterally. There is no surrounding erythema or drainage along the nail sites. No open lesions or pre-ulcerative lesions are identified. Dry skin present bilaterally there is no skin fissures or open sores.  No signs of infection. No other areas of tenderness bilateral lower extremities. No overlying edema, erythema, increased warmth. No pain with calf compression, swelling, warmth, erythema.  Assessment: Patient presents with symptomatic onychomycosis, dry skin  Plan: -Treatment options including alternatives, risks, complications were discussed -Nails sharply debrided 10 without complication/bleeding. -Discussed moisturizer to the feet daily.  Monitor for any skin fissures or breakdown. -Discussed daily foot inspection. If there are any changes, to call the office immediately.  -Follow-up in 3 months or sooner if any problems are to arise. In the meantime, encouraged to call the office with any questions, concerns, changes symptoms.  Celesta Gentile, DPM

## 2017-09-15 ENCOUNTER — Encounter (INDEPENDENT_AMBULATORY_CARE_PROVIDER_SITE_OTHER): Payer: PPO | Admitting: Ophthalmology

## 2017-10-01 ENCOUNTER — Encounter (INDEPENDENT_AMBULATORY_CARE_PROVIDER_SITE_OTHER): Payer: PPO | Admitting: Ophthalmology

## 2017-10-01 DIAGNOSIS — H43813 Vitreous degeneration, bilateral: Secondary | ICD-10-CM

## 2017-10-01 DIAGNOSIS — H35033 Hypertensive retinopathy, bilateral: Secondary | ICD-10-CM

## 2017-10-01 DIAGNOSIS — I10 Essential (primary) hypertension: Secondary | ICD-10-CM | POA: Diagnosis not present

## 2017-10-01 DIAGNOSIS — H34812 Central retinal vein occlusion, left eye, with macular edema: Secondary | ICD-10-CM | POA: Diagnosis not present

## 2017-10-01 DIAGNOSIS — E113312 Type 2 diabetes mellitus with moderate nonproliferative diabetic retinopathy with macular edema, left eye: Secondary | ICD-10-CM | POA: Diagnosis not present

## 2017-10-01 DIAGNOSIS — E11311 Type 2 diabetes mellitus with unspecified diabetic retinopathy with macular edema: Secondary | ICD-10-CM

## 2017-10-01 DIAGNOSIS — E113291 Type 2 diabetes mellitus with mild nonproliferative diabetic retinopathy without macular edema, right eye: Secondary | ICD-10-CM

## 2017-10-08 DIAGNOSIS — E7800 Pure hypercholesterolemia, unspecified: Secondary | ICD-10-CM | POA: Diagnosis not present

## 2017-10-08 DIAGNOSIS — I4892 Unspecified atrial flutter: Secondary | ICD-10-CM | POA: Diagnosis not present

## 2017-10-08 DIAGNOSIS — I951 Orthostatic hypotension: Secondary | ICD-10-CM | POA: Diagnosis not present

## 2017-10-08 DIAGNOSIS — I10 Essential (primary) hypertension: Secondary | ICD-10-CM | POA: Diagnosis not present

## 2017-10-20 DIAGNOSIS — I10 Essential (primary) hypertension: Secondary | ICD-10-CM | POA: Diagnosis not present

## 2017-10-20 DIAGNOSIS — E7800 Pure hypercholesterolemia, unspecified: Secondary | ICD-10-CM | POA: Diagnosis not present

## 2017-10-26 DIAGNOSIS — S60221A Contusion of right hand, initial encounter: Secondary | ICD-10-CM | POA: Diagnosis not present

## 2017-10-26 DIAGNOSIS — J302 Other seasonal allergic rhinitis: Secondary | ICD-10-CM | POA: Diagnosis not present

## 2017-10-27 ENCOUNTER — Other Ambulatory Visit: Payer: Self-pay | Admitting: Internal Medicine

## 2017-11-12 ENCOUNTER — Encounter (INDEPENDENT_AMBULATORY_CARE_PROVIDER_SITE_OTHER): Payer: PPO | Admitting: Ophthalmology

## 2017-11-14 ENCOUNTER — Other Ambulatory Visit: Payer: Self-pay | Admitting: Internal Medicine

## 2017-11-29 ENCOUNTER — Telehealth: Payer: Self-pay | Admitting: Internal Medicine

## 2017-11-29 NOTE — Telephone Encounter (Signed)
Pt's called stating that Linzess does not help with bms. She wants to know if there is another medication that she can take.

## 2017-11-29 NOTE — Telephone Encounter (Signed)
Pt states that the linzess is not working for her. Reports she has taken 2-linzess 261mcg at a time and it is not doing anything for her. Pt requesting something different for constipation. Please advise.

## 2017-11-30 NOTE — Telephone Encounter (Signed)
Left message for pt to call back  °

## 2017-11-30 NOTE — Telephone Encounter (Signed)
Would recommend a bowel purge with MiraLAX prep Followed by changing to Amitiza 24 mcg twice daily with food Discontinue Linzess

## 2017-12-01 ENCOUNTER — Other Ambulatory Visit: Payer: Self-pay

## 2017-12-01 MED ORDER — LUBIPROSTONE 24 MCG PO CAPS: 24.0000 ug | ORAL_CAPSULE | Freq: Two times a day (BID) | ORAL | 3 refills | Status: DC

## 2017-12-01 NOTE — Telephone Encounter (Signed)
After multiple attempts have been unable to reach pt by phone. Letter mailed to pt, script sent to pharmacy.

## 2017-12-07 ENCOUNTER — Ambulatory Visit: Payer: PPO | Admitting: Podiatry

## 2017-12-08 ENCOUNTER — Encounter (INDEPENDENT_AMBULATORY_CARE_PROVIDER_SITE_OTHER): Payer: PPO | Admitting: Ophthalmology

## 2017-12-08 DIAGNOSIS — H34812 Central retinal vein occlusion, left eye, with macular edema: Secondary | ICD-10-CM

## 2017-12-08 DIAGNOSIS — E113312 Type 2 diabetes mellitus with moderate nonproliferative diabetic retinopathy with macular edema, left eye: Secondary | ICD-10-CM | POA: Diagnosis not present

## 2017-12-08 DIAGNOSIS — I10 Essential (primary) hypertension: Secondary | ICD-10-CM | POA: Diagnosis not present

## 2017-12-08 DIAGNOSIS — H43813 Vitreous degeneration, bilateral: Secondary | ICD-10-CM

## 2017-12-08 DIAGNOSIS — E113291 Type 2 diabetes mellitus with mild nonproliferative diabetic retinopathy without macular edema, right eye: Secondary | ICD-10-CM

## 2017-12-08 DIAGNOSIS — E11311 Type 2 diabetes mellitus with unspecified diabetic retinopathy with macular edema: Secondary | ICD-10-CM

## 2017-12-08 DIAGNOSIS — H35033 Hypertensive retinopathy, bilateral: Secondary | ICD-10-CM

## 2017-12-22 DIAGNOSIS — R1310 Dysphagia, unspecified: Secondary | ICD-10-CM | POA: Diagnosis not present

## 2017-12-22 DIAGNOSIS — J069 Acute upper respiratory infection, unspecified: Secondary | ICD-10-CM | POA: Diagnosis not present

## 2018-01-05 DIAGNOSIS — E1122 Type 2 diabetes mellitus with diabetic chronic kidney disease: Secondary | ICD-10-CM | POA: Diagnosis not present

## 2018-01-05 DIAGNOSIS — I872 Venous insufficiency (chronic) (peripheral): Secondary | ICD-10-CM | POA: Diagnosis not present

## 2018-01-12 DIAGNOSIS — J302 Other seasonal allergic rhinitis: Secondary | ICD-10-CM | POA: Diagnosis not present

## 2018-01-12 DIAGNOSIS — I10 Essential (primary) hypertension: Secondary | ICD-10-CM | POA: Diagnosis not present

## 2018-01-12 DIAGNOSIS — N3281 Overactive bladder: Secondary | ICD-10-CM | POA: Diagnosis not present

## 2018-01-12 DIAGNOSIS — Z0000 Encounter for general adult medical examination without abnormal findings: Secondary | ICD-10-CM | POA: Diagnosis not present

## 2018-01-12 DIAGNOSIS — K210 Gastro-esophageal reflux disease with esophagitis: Secondary | ICD-10-CM | POA: Diagnosis not present

## 2018-01-12 DIAGNOSIS — E785 Hyperlipidemia, unspecified: Secondary | ICD-10-CM | POA: Diagnosis not present

## 2018-01-12 DIAGNOSIS — Z1231 Encounter for screening mammogram for malignant neoplasm of breast: Secondary | ICD-10-CM | POA: Diagnosis not present

## 2018-01-17 DIAGNOSIS — E7800 Pure hypercholesterolemia, unspecified: Secondary | ICD-10-CM | POA: Diagnosis not present

## 2018-01-17 DIAGNOSIS — E11319 Type 2 diabetes mellitus with unspecified diabetic retinopathy without macular edema: Secondary | ICD-10-CM | POA: Diagnosis not present

## 2018-01-17 DIAGNOSIS — E1142 Type 2 diabetes mellitus with diabetic polyneuropathy: Secondary | ICD-10-CM | POA: Diagnosis not present

## 2018-01-17 DIAGNOSIS — R635 Abnormal weight gain: Secondary | ICD-10-CM | POA: Diagnosis not present

## 2018-01-17 DIAGNOSIS — N189 Chronic kidney disease, unspecified: Secondary | ICD-10-CM | POA: Diagnosis not present

## 2018-01-17 DIAGNOSIS — G609 Hereditary and idiopathic neuropathy, unspecified: Secondary | ICD-10-CM | POA: Diagnosis not present

## 2018-01-17 DIAGNOSIS — I10 Essential (primary) hypertension: Secondary | ICD-10-CM | POA: Diagnosis not present

## 2018-01-26 ENCOUNTER — Encounter (INDEPENDENT_AMBULATORY_CARE_PROVIDER_SITE_OTHER): Payer: PPO | Admitting: Ophthalmology

## 2018-01-26 DIAGNOSIS — I10 Essential (primary) hypertension: Secondary | ICD-10-CM | POA: Diagnosis not present

## 2018-01-26 DIAGNOSIS — H35033 Hypertensive retinopathy, bilateral: Secondary | ICD-10-CM | POA: Diagnosis not present

## 2018-01-26 DIAGNOSIS — E113291 Type 2 diabetes mellitus with mild nonproliferative diabetic retinopathy without macular edema, right eye: Secondary | ICD-10-CM

## 2018-01-26 DIAGNOSIS — E113312 Type 2 diabetes mellitus with moderate nonproliferative diabetic retinopathy with macular edema, left eye: Secondary | ICD-10-CM | POA: Diagnosis not present

## 2018-01-26 DIAGNOSIS — H43813 Vitreous degeneration, bilateral: Secondary | ICD-10-CM

## 2018-01-26 DIAGNOSIS — E11311 Type 2 diabetes mellitus with unspecified diabetic retinopathy with macular edema: Secondary | ICD-10-CM

## 2018-01-26 DIAGNOSIS — H34812 Central retinal vein occlusion, left eye, with macular edema: Secondary | ICD-10-CM | POA: Diagnosis not present

## 2018-02-07 DIAGNOSIS — I4892 Unspecified atrial flutter: Secondary | ICD-10-CM | POA: Diagnosis not present

## 2018-02-07 DIAGNOSIS — I10 Essential (primary) hypertension: Secondary | ICD-10-CM | POA: Diagnosis not present

## 2018-02-07 DIAGNOSIS — E7800 Pure hypercholesterolemia, unspecified: Secondary | ICD-10-CM | POA: Diagnosis not present

## 2018-02-07 DIAGNOSIS — Z8673 Personal history of transient ischemic attack (TIA), and cerebral infarction without residual deficits: Secondary | ICD-10-CM | POA: Diagnosis not present

## 2018-02-11 DIAGNOSIS — Z1231 Encounter for screening mammogram for malignant neoplasm of breast: Secondary | ICD-10-CM | POA: Diagnosis not present

## 2018-02-28 DIAGNOSIS — M545 Low back pain: Secondary | ICD-10-CM | POA: Diagnosis not present

## 2018-02-28 DIAGNOSIS — J22 Unspecified acute lower respiratory infection: Secondary | ICD-10-CM | POA: Diagnosis not present

## 2018-02-28 DIAGNOSIS — G8929 Other chronic pain: Secondary | ICD-10-CM | POA: Diagnosis not present

## 2018-02-28 DIAGNOSIS — J302 Other seasonal allergic rhinitis: Secondary | ICD-10-CM | POA: Diagnosis not present

## 2018-03-16 ENCOUNTER — Ambulatory Visit: Payer: PPO | Admitting: Physician Assistant

## 2018-03-16 ENCOUNTER — Encounter (INDEPENDENT_AMBULATORY_CARE_PROVIDER_SITE_OTHER): Payer: PPO | Admitting: Ophthalmology

## 2018-03-16 DIAGNOSIS — H43813 Vitreous degeneration, bilateral: Secondary | ICD-10-CM | POA: Diagnosis not present

## 2018-03-16 DIAGNOSIS — I10 Essential (primary) hypertension: Secondary | ICD-10-CM

## 2018-03-16 DIAGNOSIS — H34812 Central retinal vein occlusion, left eye, with macular edema: Secondary | ICD-10-CM

## 2018-03-16 DIAGNOSIS — E113291 Type 2 diabetes mellitus with mild nonproliferative diabetic retinopathy without macular edema, right eye: Secondary | ICD-10-CM | POA: Diagnosis not present

## 2018-03-16 DIAGNOSIS — E113312 Type 2 diabetes mellitus with moderate nonproliferative diabetic retinopathy with macular edema, left eye: Secondary | ICD-10-CM | POA: Diagnosis not present

## 2018-03-16 DIAGNOSIS — H35033 Hypertensive retinopathy, bilateral: Secondary | ICD-10-CM | POA: Diagnosis not present

## 2018-03-16 DIAGNOSIS — E11311 Type 2 diabetes mellitus with unspecified diabetic retinopathy with macular edema: Secondary | ICD-10-CM

## 2018-03-18 ENCOUNTER — Encounter: Payer: Self-pay | Admitting: Physician Assistant

## 2018-03-18 ENCOUNTER — Ambulatory Visit: Payer: PPO | Admitting: Physician Assistant

## 2018-03-18 VITALS — BP 150/60 | HR 61 | Ht 61.0 in | Wt 162.0 lb

## 2018-03-18 DIAGNOSIS — R12 Heartburn: Secondary | ICD-10-CM | POA: Diagnosis not present

## 2018-03-18 DIAGNOSIS — R1310 Dysphagia, unspecified: Secondary | ICD-10-CM | POA: Diagnosis not present

## 2018-03-18 MED ORDER — PANTOPRAZOLE SODIUM 40 MG PO TBEC: 40.0000 mg | DELAYED_RELEASE_TABLET | Freq: Two times a day (BID) | ORAL | 5 refills | Status: DC

## 2018-03-18 NOTE — Patient Instructions (Signed)
If you are age 82 or older, your body mass index should be between 23-30. Your Body mass index is 30.61 kg/m. If this is out of the aforementioned range listed, please consider follow up with your Primary Care Provider.  If you are age 31 or younger, your body mass index should be between 19-25. Your Body mass index is 30.61 kg/m. If this is out of the aformentioned range listed, please consider follow up with your Primary Care Provider.   You have been scheduled for a Barium Esophogram at [facility] (1st floor of the hospital) on 04/01/18 at 11 am. Please arrive 15 minutes prior to your appointment for registration. Make certain not to have anything to eat or drink 3 hours prior to your test. If you need to reschedule for any reason, please contact radiology at [phone] to do so. __________________________________________________________________ A barium swallow is an examination that concentrates on views of the esophagus. This tends to be a double contrast exam (barium and two liquids which, when combined, create a gas to distend the wall of the oesophagus) or single contrast (non-ionic iodine based). The study is usually tailored to your symptoms so a good history is essential. Attention is paid during the study to the form, structure and configuration of the esophagus, looking for functional disorders (such as aspiration, dysphagia, achalasia, motility and reflux) EXAMINATION You may be asked to change into a gown, depending on the type of swallow being performed. A radiologist and radiographer will perform the procedure. The radiologist will advise you of the type of contrast selected for your procedure and direct you during the exam. You will be asked to stand, sit or lie in several different positions and to hold a small amount of fluid in your mouth before being asked to swallow while the imaging is performed .In some instances you may be asked to swallow barium coated marshmallows  to assess the motility of a solid food bolus. The exam can be recorded as a digital or video fluoroscopy procedure. POST PROCEDURE It will take 1-2 days for the barium to pass through your system. To facilitate this, it is important, unless otherwise directed, to increase your fluids for the next 24-48hrs and to resume your normal diet.  This test typically takes about 30 minutes to perform. _____________________________________________________________________________ STOP Zantac.  Continue Amitiza twice daily.  We have sent the following medications to your pharmacy for you to pick up at your convenience: Pantoprazole   Follow up with Dr. Hilarie Fredrickson as needed.  Thank you for choosing me and [facility].   Ellouise Newer, PA-C

## 2018-03-18 NOTE — Progress Notes (Addendum)
Chief Complaint: "My saliva feels thick"  HPI:    Kerry Barnes is an 84 year old female with a past medical history of A. fib on Eliquis, IBS with chronic constipation, GERD and history of colon polyps, as well as multiple others listed below, who follows with Dr. Hilarie Fredrickson and presents to clinic today with a complaint of "my saliva feels thick".    02/04/2017 office visit with Dr. Hilarie Fredrickson to discuss esophageal dysphagia.  Also discussed constipation going 3 to 4 days normally and then skip 2 or 3 days at a time.  Reflux is well controlled on Pantoprazole 40 mg in the morning and Ranitidine 150 mg in the evening.  At that time, discussed she had esophageal dysmotility which contributed to her dysphagia.  She was scheduled for an EGD with empiric dilation.  It was recommended she hold Eliquis 2 days prior to her procedure.  Continued on Linzess 290 mcg daily with MiraLAX 17 g daily.    EGD 03/16/2017 showed a normal esophagus, stomach and examined duodenum, dilation was performed in the lower third of the esophagus to 18 mm.     11/29/2017 patient called and described the Linzess was not helping her.  At that time recommend she do a bowel purge with MiraLAX prep followed by changing to Amitiza 24 mcg twice daily with food.    Today, explains that over the past couple of months she has noticed that she is spitting up a "thick white" saliva.  Patient tells me that she spit this into the toilet one time and it was "sort of red".  Tells me that "I was slipping through the channels on TV and that the doctor said that if you have thick spit that you might have throat cancer".  Patient is somewhat concerned about this.  She tells me she has not necessarily been having any trouble swallowing food, pills or liquids but when she tries to swallow her saliva it is a little bit more difficult.  Associated symptoms include a slight increase in heartburn regardless of her Pantoprazole 40 mg daily and Zantac 150 nightly.  No  changes in medication recently.    Reports that her constipation is under control on Amitiza 24 mcg twice daily.    Denies fever, chills, weight loss, anorexia, nausea, vomiting, abdominal pain or symptoms that awaken her from sleep.  Past Medical History:  Diagnosis Date  . Allergy   . Anxiety   . Arthritis   . Asthma   . Atrial fibrillation ([place])   . Atrial flutter ([place])   . Blood transfusion without reported diagnosis   . Cataract   . Chronic diastolic CHF (congestive heart failure) ([place])   . Diabetes mellitus   . Fibromyalgia   . GERD (gastroesophageal reflux disease)   . Hyperlipemia   . Hypertension   . IBS (irritable bowel syndrome)   . Stroke [facility]) 2013   TIA  . Tubular adenoma of colon     Past Surgical History:  Procedure Laterality Date  . ANKLE FRACTURE SURGERY    . CARDIOVERSION N/A 02/04/2016   Procedure: CARDIOVERSION;  Surgeon: Sueanne Margarita, MD;  Location: [facility] ENDOSCOPY;  Service: Cardiovascular;  Laterality: N/A;  . CARDIOVERSION N/A 05/01/2016   Procedure: CARDIOVERSION;  Surgeon: Fay Records, MD;  Location: [place];  Service: Cardiovascular;  Laterality: N/A;  . CHOLECYSTECTOMY    . TEE WITHOUT CARDIOVERSION N/A 02/04/2016   Procedure: TRANSESOPHAGEAL ECHOCARDIOGRAM (TEE);  Surgeon: Sueanne Margarita, MD;  Location: [facility]  ENDOSCOPY;  Service: Cardiovascular;  Laterality: N/A;  . TUBAL LIGATION    . WRIST FRACTURE SURGERY      Current Outpatient Medications  Medication Sig Dispense Refill  . acetaminophen (TYLENOL) 325 MG tablet Take 2 tablets (650 mg total) by mouth every 4 (four) hours as needed for headache or mild pain.    Marland Kitchen albuterol (PROVENTIL HFA;VENTOLIN HFA) 108 (90 BASE) MCG/ACT inhaler Inhale into the lungs every 6 (six) hours as needed for wheezing or shortness of breath.    . ALPRAZolam (XANAX) 0.5 MG tablet Take 0.5 mg by mouth 2 (two) times daily as needed for anxiety.    Marland Kitchen amiodarone (PACERONE) 200 MG tablet Take 1 tablet (200 mg total) by  mouth daily. 90 tablet 0  . apixaban (ELIQUIS) 2.5 MG TABS tablet Take 1 tablet by mouth twice daily, need to schedule MD appt for further refills 60 tablet 0  . atorvastatin (LIPITOR) 40 MG tablet Take 40 mg by mouth daily.    . cetirizine (ZYRTEC) 10 MG tablet Take 10 mg by mouth daily.    Marland Kitchen diltiazem (CARDIZEM SR) 60 MG 12 hr capsule Take 60 mg daily by mouth.    . EPINEPHrine (EPI-PEN) 0.3 mg/0.3 mL SOAJ injection Inject 0.3 mg once as needed into the muscle (severe allergic reaction).     . Fluticasone-Salmeterol (ADVAIR) 100-50 MCG/DOSE AEPB Inhale 1 puff into the lungs every 12 (twelve) hours.    . Insulin Glargine (LANTUS SOLOSTAR) 100 UNIT/ML Solostar Pen Inject 18 Units daily before breakfast into the skin.    Marland Kitchen linaclotide (LINZESS) 290 MCG CAPS capsule Take 1 capsule (290 mcg total) by mouth daily before breakfast. 90 capsule 1  . LINZESS 290 MCG CAPS capsule TAKE 1 CAPSULE BY MOUTH DAILY BEFORE BREAKFAST 90 capsule 1  . lubiprostone (AMITIZA) 24 MCG capsule Take 1 capsule (24 mcg total) by mouth 2 (two) times daily with a meal. 60 capsule 3  . meclizine (ANTIVERT) 25 MG tablet Take 25 mg by mouth 3 (three) times daily as needed for dizziness.     . methocarbamol (ROBAXIN) 500 MG tablet Take 1 tablet (500 mg total) every 6 (six) hours as needed by mouth for muscle spasms. 15 tablet 0  . mometasone (NASONEX) 50 MCG/ACT nasal spray Place 2 sprays into the nose daily.    . montelukast (SINGULAIR) 10 MG tablet Take 10 mg at bedtime by mouth.     Marland Kitchen olopatadine (PATANOL) 0.1 % ophthalmic solution Place 1 drop into both eyes 2 (two) times daily.     . pantoprazole (PROTONIX) 40 MG tablet TAKE 1 TABLET(40 MG) BY MOUTH DAILY 90 tablet 0  . pregabalin (LYRICA) 75 MG capsule Take 75 mg by mouth 2 (two) times daily.    Marland Kitchen Propylene Glycol (SYSTANE BALANCE OP) Place 1 drop daily as needed into both eyes (dryness).     . ranitidine (ZANTAC) 150 MG capsule TAKE 1 CAPSULE(150 MG) BY MOUTH EVERY EVENING  90 capsule 1  . traMADol (ULTRAM) 50 MG tablet Take 50 mg every 12 (twelve) hours as needed by mouth (pain).      No current facility-administered medications for this visit.     Allergies as of 03/18/2018 - Review Complete 09/06/2017  Allergen Reaction Noted  . Codeine Nausea Only 10/10/2013  . Metformin Diarrhea 07/12/2015  . Amoxicillin Rash 11/01/2006  . Biaxin [clarithromycin] Rash 10/10/2013  . Ciprofloxacin Rash 04/04/2007  . Penicillins Rash 04/04/2007    Family History  Problem Relation Age  of Onset  . Diabetes Mother   . Diabetes Father   . Diabetes Sister   . Diabetes Brother   . Colon cancer Neg Hx   . Stomach cancer Neg Hx     Social History   Socioeconomic History  . Marital status: Married    Spouse name: Not on file  . Number of children: 8  . Years of education: Not on file  . Highest education level: Not on file  Occupational History  . Occupation: Arboriculturist: RETIRED  Social Needs  . Financial resource strain: Not on file  . Food insecurity:    Worry: Not on file    Inability: Not on file  . Transportation needs:    Medical: Not on file    Non-medical: Not on file  Tobacco Use  . Smoking status: Never Smoker  . Smokeless tobacco: Never Used  Substance and Sexual Activity  . Alcohol use: No    Alcohol/week: 0.0 standard drinks  . Drug use: No  . Sexual activity: Never    Partners: Male  Lifestyle  . Physical activity:    Days per week: Not on file    Minutes per session: Not on file  . Stress: Not on file  Relationships  . Social connections:    Talks on phone: Not on file    Gets together: Not on file    Attends religious service: Not on file    Active member of club or organization: Not on file    Attends meetings of clubs or organizations: Not on file    Relationship status: Not on file  . Intimate partner violence:    Fear of current or ex partner: Not on file    Emotionally abused: Not on file    Physically  abused: Not on file    Forced sexual activity: Not on file  Other Topics Concern  . Not on file  Social History Narrative   Grandchildren 14      Great Grandchildren 13    Review of Systems:    Constitutional: No weight loss, fever or chills Skin: No rash  Cardiovascular: No chest pain Respiratory: No SOB  Gastrointestinal: See HPI and otherwise negative   Physical Exam:  Vital signs: BP (!) 150/60   Pulse 61   Ht 5\' 1"  (1.549 m)   Wt 162 lb (73.5 kg)   BMI 30.61 kg/m   Constitutional:   Pleasant elderly AA female appears to be in NAD, Well developed, Well nourished, alert and cooperative Head:  Normocephalic and atraumatic. Eyes:   PEERL, EOMI. No icterus. Conjunctiva pink. Ears:  Normal auditory acuity. Neck:  Supple Throat: Oral cavity and pharynx without inflammation, swelling or lesion.  Respiratory: Respirations even and unlabored. Lungs clear to auscultation bilaterally.   No wheezes, crackles, or rhonchi.  Cardiovascular: Normal S1, S2. No MRG. Regular rate and rhythm. No peripheral edema, cyanosis or pallor.  Gastrointestinal:  Soft, nondistended, nontender. No rebound or guarding. Normal bowel sounds. No appreciable masses or hepatomegaly. Rectal:  Not performed.  Psychiatric: Demonstrates good judgement and reason without abnormal affect or behaviors.  No recent labs or imaging.  Assessment: 1.  Heartburn: Increased over the past couple of months, possibly contributing to below 2.  Dysphagia: Describes a change in her saliva to thicker which seems harder to swallow, does have history of dysmotility which is likely contributory  Plan: 1.  Scheduled patient for an esophagram with tablet 2.  Recommend patient discontinue  her Zantac for now. 3.  Increased Pantoprazole to 40 mg twice daily, 30-60 minutes before breakfast and dinner #60 with 5 refills 4.  Continue Amitiza 24 mcg twice daily 5.  Reassured the patient that she has had a recent EGD which was  completely normal 6.  Patient to follow in clinic as directed after imaging above with Dr. Hilarie Fredrickson or myself  Ellouise Newer, PA-C [facility] 03/18/2018, 9:20 AM   Addendum: Reviewed and agree with assessment and management plan. Pyrtle, Lajuan Lines, MD    Cc: Aletha Halim., PA-C

## 2018-04-01 ENCOUNTER — Ambulatory Visit (HOSPITAL_COMMUNITY)
Admission: RE | Admit: 2018-04-01 | Discharge: 2018-04-01 | Disposition: A | Discharge status: Home / Self Care | Payer: PPO | Source: Ambulatory Visit | Attending: Physician Assistant | Admitting: Physician Assistant

## 2018-04-01 DIAGNOSIS — R05 Cough: Secondary | ICD-10-CM | POA: Diagnosis not present

## 2018-04-01 DIAGNOSIS — R12 Heartburn: Secondary | ICD-10-CM | POA: Insufficient documentation

## 2018-04-01 DIAGNOSIS — K224 Dyskinesia of esophagus: Secondary | ICD-10-CM | POA: Insufficient documentation

## 2018-04-01 DIAGNOSIS — K449 Diaphragmatic hernia without obstruction or gangrene: Secondary | ICD-10-CM | POA: Insufficient documentation

## 2018-04-01 DIAGNOSIS — R1310 Dysphagia, unspecified: Secondary | ICD-10-CM | POA: Diagnosis present

## 2018-04-18 DIAGNOSIS — J019 Acute sinusitis, unspecified: Secondary | ICD-10-CM | POA: Diagnosis not present

## 2018-04-18 DIAGNOSIS — I10 Essential (primary) hypertension: Secondary | ICD-10-CM | POA: Diagnosis not present

## 2018-04-23 ENCOUNTER — Inpatient Hospital Stay (HOSPITAL_COMMUNITY)
Admission: EM | Admit: 2018-04-23 | Discharge: 2018-04-26 | DRG: 309 | Disposition: A | Discharge status: Home / Self Care | Payer: PPO | Attending: Internal Medicine | Admitting: Internal Medicine

## 2018-04-23 ENCOUNTER — Emergency Department (HOSPITAL_COMMUNITY): Payer: PPO

## 2018-04-23 ENCOUNTER — Other Ambulatory Visit: Payer: Self-pay

## 2018-04-23 ENCOUNTER — Encounter (HOSPITAL_COMMUNITY): Payer: Self-pay | Admitting: Emergency Medicine

## 2018-04-23 DIAGNOSIS — H269 Unspecified cataract: Secondary | ICD-10-CM | POA: Diagnosis present

## 2018-04-23 DIAGNOSIS — M1990 Unspecified osteoarthritis, unspecified site: Secondary | ICD-10-CM | POA: Diagnosis present

## 2018-04-23 DIAGNOSIS — N183 Chronic kidney disease, stage 3 unspecified: Secondary | ICD-10-CM | POA: Diagnosis present

## 2018-04-23 DIAGNOSIS — I484 Atypical atrial flutter: Principal | ICD-10-CM | POA: Diagnosis present

## 2018-04-23 DIAGNOSIS — E785 Hyperlipidemia, unspecified: Secondary | ICD-10-CM | POA: Diagnosis present

## 2018-04-23 DIAGNOSIS — F419 Anxiety disorder, unspecified: Secondary | ICD-10-CM | POA: Diagnosis not present

## 2018-04-23 DIAGNOSIS — I2510 Atherosclerotic heart disease of native coronary artery without angina pectoris: Secondary | ICD-10-CM | POA: Diagnosis present

## 2018-04-23 DIAGNOSIS — K559 Vascular disorder of intestine, unspecified: Secondary | ICD-10-CM | POA: Diagnosis not present

## 2018-04-23 DIAGNOSIS — I272 Pulmonary hypertension, unspecified: Secondary | ICD-10-CM | POA: Diagnosis not present

## 2018-04-23 DIAGNOSIS — R0789 Other chest pain: Secondary | ICD-10-CM | POA: Diagnosis not present

## 2018-04-23 DIAGNOSIS — I4892 Unspecified atrial flutter: Secondary | ICD-10-CM | POA: Diagnosis not present

## 2018-04-23 DIAGNOSIS — R7989 Other specified abnormal findings of blood chemistry: Secondary | ICD-10-CM | POA: Diagnosis not present

## 2018-04-23 DIAGNOSIS — Z9049 Acquired absence of other specified parts of digestive tract: Secondary | ICD-10-CM | POA: Diagnosis not present

## 2018-04-23 DIAGNOSIS — R1013 Epigastric pain: Secondary | ICD-10-CM | POA: Insufficient documentation

## 2018-04-23 DIAGNOSIS — R1032 Left lower quadrant pain: Secondary | ICD-10-CM | POA: Diagnosis not present

## 2018-04-23 DIAGNOSIS — Z794 Long term (current) use of insulin: Secondary | ICD-10-CM | POA: Diagnosis not present

## 2018-04-23 DIAGNOSIS — I10 Essential (primary) hypertension: Secondary | ICD-10-CM

## 2018-04-23 DIAGNOSIS — K589 Irritable bowel syndrome without diarrhea: Secondary | ICD-10-CM | POA: Diagnosis present

## 2018-04-23 DIAGNOSIS — I130 Hypertensive heart and chronic kidney disease with heart failure and stage 1 through stage 4 chronic kidney disease, or unspecified chronic kidney disease: Secondary | ICD-10-CM | POA: Diagnosis not present

## 2018-04-23 DIAGNOSIS — I700 Atherosclerosis of aorta: Secondary | ICD-10-CM | POA: Diagnosis not present

## 2018-04-23 DIAGNOSIS — E1122 Type 2 diabetes mellitus with diabetic chronic kidney disease: Secondary | ICD-10-CM | POA: Diagnosis present

## 2018-04-23 DIAGNOSIS — Z881 Allergy status to other antibiotic agents status: Secondary | ICD-10-CM | POA: Diagnosis not present

## 2018-04-23 DIAGNOSIS — Z833 Family history of diabetes mellitus: Secondary | ICD-10-CM

## 2018-04-23 DIAGNOSIS — I471 Supraventricular tachycardia: Secondary | ICD-10-CM | POA: Diagnosis present

## 2018-04-23 DIAGNOSIS — K219 Gastro-esophageal reflux disease without esophagitis: Secondary | ICD-10-CM | POA: Diagnosis not present

## 2018-04-23 DIAGNOSIS — R109 Unspecified abdominal pain: Secondary | ICD-10-CM | POA: Diagnosis not present

## 2018-04-23 DIAGNOSIS — I5032 Chronic diastolic (congestive) heart failure: Secondary | ICD-10-CM | POA: Diagnosis present

## 2018-04-23 DIAGNOSIS — I4891 Unspecified atrial fibrillation: Secondary | ICD-10-CM | POA: Diagnosis present

## 2018-04-23 DIAGNOSIS — Z885 Allergy status to narcotic agent status: Secondary | ICD-10-CM

## 2018-04-23 DIAGNOSIS — Z8673 Personal history of transient ischemic attack (TIA), and cerebral infarction without residual deficits: Secondary | ICD-10-CM | POA: Diagnosis not present

## 2018-04-23 DIAGNOSIS — R0609 Other forms of dyspnea: Secondary | ICD-10-CM | POA: Diagnosis not present

## 2018-04-23 DIAGNOSIS — M797 Fibromyalgia: Secondary | ICD-10-CM | POA: Diagnosis present

## 2018-04-23 DIAGNOSIS — J45909 Unspecified asthma, uncomplicated: Secondary | ICD-10-CM | POA: Diagnosis not present

## 2018-04-23 DIAGNOSIS — E041 Nontoxic single thyroid nodule: Secondary | ICD-10-CM | POA: Diagnosis present

## 2018-04-23 DIAGNOSIS — J4540 Moderate persistent asthma, uncomplicated: Secondary | ICD-10-CM | POA: Diagnosis not present

## 2018-04-23 DIAGNOSIS — R52 Pain, unspecified: Secondary | ICD-10-CM

## 2018-04-23 DIAGNOSIS — I129 Hypertensive chronic kidney disease with stage 1 through stage 4 chronic kidney disease, or unspecified chronic kidney disease: Secondary | ICD-10-CM | POA: Diagnosis not present

## 2018-04-23 DIAGNOSIS — Z880 Allergy status to penicillin: Secondary | ICD-10-CM | POA: Diagnosis not present

## 2018-04-23 DIAGNOSIS — Z7951 Long term (current) use of inhaled steroids: Secondary | ICD-10-CM

## 2018-04-23 DIAGNOSIS — Z7901 Long term (current) use of anticoagulants: Secondary | ICD-10-CM | POA: Diagnosis not present

## 2018-04-23 DIAGNOSIS — E782 Mixed hyperlipidemia: Secondary | ICD-10-CM | POA: Diagnosis present

## 2018-04-23 HISTORY — DX: Type 2 diabetes mellitus without complications: E11.9

## 2018-04-23 HISTORY — DX: Atypical atrial flutter: I48.4

## 2018-04-23 HISTORY — DX: Other seasonal allergic rhinitis: J30.2

## 2018-04-23 HISTORY — DX: Personal history of other medical treatment: Z92.89

## 2018-04-23 LAB — CBC
HCT: 46.9 % — ABNORMAL HIGH (ref 36.0–46.0)
Hemoglobin: 14.7 g/dL (ref 12.0–15.0)
MCH: 26.7 pg (ref 26.0–34.0)
MCHC: 31.3 g/dL (ref 30.0–36.0)
MCV: 85.1 fL (ref 80.0–100.0)
Platelets: 316 10*3/uL (ref 150–400)
RBC: 5.51 MIL/uL — ABNORMAL HIGH (ref 3.87–5.11)
RDW: 15.2 % (ref 11.5–15.5)
WBC: 7 10*3/uL (ref 4.0–10.5)
nRBC: 0 % (ref 0.0–0.2)

## 2018-04-23 LAB — HEPATIC FUNCTION PANEL
ALT: 16 U/L (ref 0–44)
AST: 32 U/L (ref 15–41)
Albumin: 3.9 g/dL (ref 3.5–5.0)
Alkaline Phosphatase: 88 U/L (ref 38–126)
Bilirubin, Direct: 0.2 mg/dL (ref 0.0–0.2)
Indirect Bilirubin: 1 mg/dL — ABNORMAL HIGH (ref 0.3–0.9)
Total Bilirubin: 1.2 mg/dL (ref 0.3–1.2)
Total Protein: 7.6 g/dL (ref 6.5–8.1)

## 2018-04-23 LAB — GLUCOSE, CAPILLARY
Glucose-Capillary: 97 mg/dL (ref 70–99)
Glucose-Capillary: 122 mg/dL — ABNORMAL HIGH (ref 70–99)

## 2018-04-23 LAB — BASIC METABOLIC PANEL
Anion gap: 12 (ref 5–15)
BUN: 17 mg/dL (ref 8–23)
CO2: 21 mmol/L — ABNORMAL LOW (ref 22–32)
Calcium: 9.6 mg/dL (ref 8.9–10.3)
Chloride: 105 mmol/L (ref 98–111)
Creatinine, Ser: 1.67 mg/dL — ABNORMAL HIGH (ref 0.44–1.00)
GFR calc Af Amer: 32 mL/min — ABNORMAL LOW (ref >60)
GFR calc non Af Amer: 27 mL/min — ABNORMAL LOW (ref >60)
Glucose, Bld: 127 mg/dL — ABNORMAL HIGH (ref 70–99)
Potassium: 3.5 mmol/L (ref 3.5–5.1)
Sodium: 138 mmol/L (ref 135–145)

## 2018-04-23 LAB — TROPONIN I
Troponin I: 0.06 ng/mL (ref <0.03)
Troponin I: 0.05 ng/mL (ref <0.03)

## 2018-04-23 LAB — LIPASE, BLOOD: Lipase: 90 U/L — ABNORMAL HIGH (ref 11–51)

## 2018-04-23 LAB — BRAIN NATRIURETIC PEPTIDE: B Natriuretic Peptide: 285 pg/mL — ABNORMAL HIGH (ref 0.0–100.0)

## 2018-04-23 LAB — I-STAT TROPONIN, ED: Troponin i, poc: 0.07 ng/mL (ref 0.00–0.08)

## 2018-04-23 MED ORDER — MONTELUKAST SODIUM 10 MG PO TABS
10.0000 mg | ORAL_TABLET | Freq: Every day | ORAL | Status: DC
  Administered 2018-04-23 – 2018-04-25 (×3): 10 mg via ORAL
  Filled 2018-04-23 (×3): qty 1

## 2018-04-23 MED ORDER — PROCHLORPERAZINE EDISYLATE 10 MG/2ML IJ SOLN
5.0000 mg | Freq: Four times a day (QID) | INTRAMUSCULAR | Status: DC | PRN
  Filled 2018-04-23: qty 1

## 2018-04-23 MED ORDER — AMIODARONE HCL 200 MG PO TABS
200.0000 mg | ORAL_TABLET | Freq: Two times a day (BID) | ORAL | Status: DC
  Administered 2018-04-23 – 2018-04-26 (×6): 200 mg via ORAL
  Filled 2018-04-23 (×6): qty 1

## 2018-04-23 MED ORDER — ATORVASTATIN CALCIUM 40 MG PO TABS
40.0000 mg | ORAL_TABLET | Freq: Every day | ORAL | Status: DC
  Administered 2018-04-23 – 2018-04-26 (×4): 40 mg via ORAL
  Filled 2018-04-23 (×4): qty 1

## 2018-04-23 MED ORDER — MECLIZINE HCL 25 MG PO TABS: 25.0000 mg | ORAL_TABLET | Freq: Three times a day (TID) | ORAL | Status: DC | PRN

## 2018-04-23 MED ORDER — ACETAMINOPHEN 325 MG PO TABS: 650.0000 mg | ORAL_TABLET | ORAL | Status: DC | PRN

## 2018-04-23 MED ORDER — ONDANSETRON HCL 4 MG/2ML IJ SOLN: 4.0000 mg | Freq: Four times a day (QID) | INTRAMUSCULAR | Status: DC | PRN

## 2018-04-23 MED ORDER — FLUTICASONE PROPIONATE 50 MCG/ACT NA SUSP
1.0000 | Freq: Every day | NASAL | Status: DC
  Administered 2018-04-24 – 2018-04-26 (×3): 1 via NASAL
  Filled 2018-04-23: qty 16

## 2018-04-23 MED ORDER — ASPIRIN EC 325 MG PO TBEC
325.0000 mg | DELAYED_RELEASE_TABLET | Freq: Every day | ORAL | Status: DC
  Administered 2018-04-23 – 2018-04-26 (×4): 325 mg via ORAL
  Filled 2018-04-23 (×4): qty 1

## 2018-04-23 MED ORDER — ALUM & MAG HYDROXIDE-SIMETH 200-200-20 MG/5ML PO SUSP
30.0000 mL | Freq: Once | ORAL | Status: AC
  Administered 2018-04-23: 30 mL via ORAL
  Filled 2018-04-23: qty 30

## 2018-04-23 MED ORDER — DARIFENACIN HYDROBROMIDE ER 7.5 MG PO TB24
7.5000 mg | ORAL_TABLET | Freq: Every day | ORAL | Status: DC
  Administered 2018-04-23 – 2018-04-26 (×4): 7.5 mg via ORAL
  Filled 2018-04-23 (×4): qty 1

## 2018-04-23 MED ORDER — ALBUTEROL SULFATE (2.5 MG/3ML) 0.083% IN NEBU: 3.0000 mL | INHALATION_SOLUTION | Freq: Four times a day (QID) | RESPIRATORY_TRACT | Status: DC | PRN

## 2018-04-23 MED ORDER — MOMETASONE FURO-FORMOTEROL FUM 100-5 MCG/ACT IN AERO
2.0000 | INHALATION_SPRAY | Freq: Two times a day (BID) | RESPIRATORY_TRACT | Status: DC
  Administered 2018-04-24 – 2018-04-26 (×5): 2 via RESPIRATORY_TRACT
  Filled 2018-04-23: qty 8.8

## 2018-04-23 MED ORDER — IOPAMIDOL (ISOVUE-370) INJECTION 76%
100.0000 mL | Freq: Once | INTRAVENOUS | Status: AC | PRN
  Administered 2018-04-23: 100 mL via INTRAVENOUS

## 2018-04-23 MED ORDER — POLYVINYL ALCOHOL 1.4 % OP SOLN: 1.0000 [drp] | Freq: Every day | OPHTHALMIC | Status: DC | PRN

## 2018-04-23 MED ORDER — DILTIAZEM HCL ER 60 MG PO CP12
60.0000 mg | ORAL_CAPSULE | Freq: Every day | ORAL | Status: DC
  Administered 2018-04-23: 60 mg via ORAL
  Filled 2018-04-23: qty 1

## 2018-04-23 MED ORDER — APIXABAN 2.5 MG PO TABS: 2.5000 mg | ORAL_TABLET | Freq: Two times a day (BID) | ORAL | Status: DC

## 2018-04-23 MED ORDER — PANTOPRAZOLE SODIUM 40 MG IV SOLR
40.0000 mg | Freq: Once | INTRAVENOUS | Status: AC
  Administered 2018-04-23: 40 mg via INTRAVENOUS
  Filled 2018-04-23: qty 40

## 2018-04-23 MED ORDER — INSULIN ASPART 100 UNIT/ML ~~LOC~~ SOLN
0.0000 [IU] | Freq: Three times a day (TID) | SUBCUTANEOUS | Status: DC
  Administered 2018-04-25: 2 [IU] via SUBCUTANEOUS
  Administered 2018-04-26 (×2): 1 [IU] via SUBCUTANEOUS

## 2018-04-23 MED ORDER — MORPHINE SULFATE (PF) 4 MG/ML IV SOLN
6.0000 mg | Freq: Once | INTRAVENOUS | Status: AC
  Administered 2018-04-23: 6 mg via INTRAVENOUS
  Filled 2018-04-23: qty 2

## 2018-04-23 MED ORDER — LIDOCAINE VISCOUS HCL 2 % MT SOLN
15.0000 mL | Freq: Once | OROMUCOSAL | Status: AC
  Administered 2018-04-23: 15 mL via ORAL
  Filled 2018-04-23: qty 15

## 2018-04-23 MED ORDER — DILTIAZEM HCL-DEXTROSE 100-5 MG/100ML-% IV SOLN (PREMIX)
10.0000 mg/h | INTRAVENOUS | Status: DC
  Administered 2018-04-23: 10 mg/h via INTRAVENOUS
  Filled 2018-04-23: qty 100

## 2018-04-23 MED ORDER — MORPHINE SULFATE (PF) 2 MG/ML IV SOLN
2.0000 mg | INTRAVENOUS | Status: DC | PRN
  Administered 2018-04-23 – 2018-04-24 (×2): 2 mg via INTRAVENOUS
  Filled 2018-04-23 (×2): qty 1

## 2018-04-23 MED ORDER — ALPRAZOLAM 0.5 MG PO TABS
0.5000 mg | ORAL_TABLET | Freq: Two times a day (BID) | ORAL | Status: DC | PRN
  Administered 2018-04-24: 0.5 mg via ORAL
  Filled 2018-04-23: qty 1

## 2018-04-23 MED ORDER — OLOPATADINE HCL 0.1 % OP SOLN
1.0000 [drp] | Freq: Two times a day (BID) | OPHTHALMIC | Status: DC
  Administered 2018-04-23 – 2018-04-26 (×6): 1 [drp] via OPHTHALMIC
  Filled 2018-04-23: qty 5

## 2018-04-23 MED ORDER — AMIODARONE HCL 200 MG PO TABS: 200.0000 mg | ORAL_TABLET | Freq: Every day | ORAL | Status: DC

## 2018-04-23 MED ORDER — HEPARIN (PORCINE) 25000 UT/250ML-% IV SOLN
650.0000 [IU]/h | INTRAVENOUS | Status: DC
  Administered 2018-04-23: 850 [IU]/h via INTRAVENOUS
  Administered 2018-04-24: 650 [IU]/h via INTRAVENOUS
  Filled 2018-04-23: qty 250

## 2018-04-23 MED ORDER — LORATADINE 10 MG PO TABS
10.0000 mg | ORAL_TABLET | Freq: Every day | ORAL | Status: DC
  Administered 2018-04-23 – 2018-04-26 (×4): 10 mg via ORAL
  Filled 2018-04-23 (×4): qty 1

## 2018-04-23 MED ORDER — ACETAMINOPHEN 325 MG PO TABS
650.0000 mg | ORAL_TABLET | ORAL | Status: DC | PRN
  Administered 2018-04-24: 650 mg via ORAL
  Filled 2018-04-23: qty 2

## 2018-04-23 MED ORDER — LUBIPROSTONE 24 MCG PO CAPS
24.0000 ug | ORAL_CAPSULE | Freq: Two times a day (BID) | ORAL | Status: DC
  Administered 2018-04-23 – 2018-04-26 (×6): 24 ug via ORAL
  Filled 2018-04-23 (×7): qty 1

## 2018-04-23 MED ORDER — ONDANSETRON HCL 4 MG/2ML IJ SOLN
4.0000 mg | Freq: Once | INTRAMUSCULAR | Status: AC
  Administered 2018-04-23: 4 mg via INTRAVENOUS
  Filled 2018-04-23: qty 2

## 2018-04-23 MED ORDER — METHOCARBAMOL 500 MG PO TABS: 500.0000 mg | ORAL_TABLET | Freq: Four times a day (QID) | ORAL | Status: DC | PRN

## 2018-04-23 MED ORDER — PREGABALIN 75 MG PO CAPS
75.0000 mg | ORAL_CAPSULE | Freq: Two times a day (BID) | ORAL | Status: DC
  Administered 2018-04-23 – 2018-04-26 (×6): 75 mg via ORAL
  Filled 2018-04-23 (×6): qty 1

## 2018-04-23 MED ORDER — INSULIN ASPART 100 UNIT/ML ~~LOC~~ SOLN: 0.0000 [IU] | Freq: Every day | SUBCUTANEOUS | Status: DC

## 2018-04-23 MED ORDER — SODIUM CHLORIDE 0.9 % IV BOLUS
1000.0000 mL | Freq: Once | INTRAVENOUS | Status: AC
  Administered 2018-04-23: 1000 mL via INTRAVENOUS

## 2018-04-23 MED ORDER — TRAMADOL HCL 50 MG PO TABS: 50.0000 mg | ORAL_TABLET | Freq: Two times a day (BID) | ORAL | Status: DC | PRN

## 2018-04-23 NOTE — Progress Notes (Addendum)
Patient in atypical atrial flutter with 2:1 conduction. Will start IV dilt and suspect ST-T changes are related to A. FL with RVR. Will follow remotely unless chest pain is recurrent and needs to go to the cath lab.   Because of possible need for titration of dilt drip, will transfer to step down. Patient is asymptomatic   [name], MD 04/23/2018, [facility]. [place] Pager: [phone] Office: [phone] If no answer: Cell:  [phone]

## 2018-04-23 NOTE — ED Notes (Signed)
ED Provider at bedside. 

## 2018-04-23 NOTE — ED Notes (Signed)
Please call Husband Breann Losano with updates: [phone]

## 2018-04-23 NOTE — ED Notes (Signed)
Patient transported to CT scan . 

## 2018-04-23 NOTE — Progress Notes (Signed)
EKG completed, in chart.

## 2018-04-23 NOTE — ED Triage Notes (Signed)
Pt c/o chest pressure that started at 0500, dyspnea with exertion and some nausea/vomiting. Hx CHF, denies weight gain.

## 2018-04-23 NOTE — H&P (Addendum)
History and Physical    Kerry Barnes ERD:[id number] DOB: [birth year] DOA: 04/23/2018  PCP: Aletha Halim., PA-C  Patient coming from: Home  Chief Complaint: Chest pressure  HPI: Kerry Barnes is a 84 y.o. female with medical history significant of TIA, IBS, HTN, HLD, GERD, FM, DM2, chronic diastolic CHF, Afib, Anxiety, arthritis, asthma, pulmonary HTN who presents for chest pressure.  Kerry Barnes reports starting yesterday morning, upon eating a banana, she developed nausea and vomiting.  She tried to take her pills, but then vomited them up.  She is not sure how many of them stayed down.  Even water was coming back up.  She went to bed and woke up this morning with severe chest pressure and abdominal pain.  She is not sure which started first.  The pain was in her middle chest and down to her epigastrium.  She also had pain in her LLQ.  The pain radiated to her neck, was associated with nausea and tachycardia.  The pain was a 9/10 and improved with rest and medications in the ED, but only down to about an 8/10.  She has had comcomitant headache, dizziness and blurry vision.  She denies diaphoresis, diarrhea, abdominal swelling, blood in the emesis or stool, change in bowel habits.  She does report feeling cold this morning.  She has no sick contacts.  Given concern for abdominal and chest pain, CT of the chest abdomen and pelvis was done and showed no acute pathology, but atherosclerosis in the aorta and coronary arteries and some other chronic findings.    ED Course: In the ED, she was noted to have a troponin of 0.07, EKG showed downward sloping of in the inferolateral leads which were new from last in 2017 and concerning for repolarization vs. Global ischemia.    Review of Systems: As per HPI otherwise 10 point review of systems negative.   Past Medical History:  Diagnosis Date  . Allergy   . Anxiety   . Arthritis   . Asthma   . Atrial fibrillation ([place])   . Atrial flutter  ([place])   . Blood transfusion without reported diagnosis   . Cataract   . Chronic diastolic CHF (congestive heart failure) ([place])   . Diabetes mellitus   . Fibromyalgia   . GERD (gastroesophageal reflux disease)   . Hyperlipemia   . Hypertension   . IBS (irritable bowel syndrome)   . Stroke [facility]) 2013   TIA  . Tubular adenoma of colon     Past Surgical History:  Procedure Laterality Date  . ANKLE FRACTURE SURGERY    . CARDIOVERSION N/A 02/04/2016   Procedure: CARDIOVERSION;  Surgeon: Sueanne Margarita, MD;  Location: [facility] ENDOSCOPY;  Service: Cardiovascular;  Laterality: N/A;  . CARDIOVERSION N/A 05/01/2016   Procedure: CARDIOVERSION;  Surgeon: Fay Records, MD;  Location: [facility];  Service: Cardiovascular;  Laterality: N/A;  . CHOLECYSTECTOMY    . TEE WITHOUT CARDIOVERSION N/A 02/04/2016   Procedure: TRANSESOPHAGEAL ECHOCARDIOGRAM (TEE);  Surgeon: Sueanne Margarita, MD;  Location: [facility] ENDOSCOPY;  Service: Cardiovascular;  Laterality: N/A;  . TUBAL LIGATION    . WRIST FRACTURE SURGERY     Reviewed with patient  reports that she has never smoked. She has never used smokeless tobacco. She reports that she does not drink alcohol or use drugs.  Allergies  Allergen Reactions  . Codeine Nausea Only  . Metformin Diarrhea  . Amoxicillin Rash  . Biaxin [Clarithromycin] Rash  . Ciprofloxacin Rash  .  Penicillins Rash    Childhood allergic reaction Has patient had a PCN reaction causing immediate rash, facial/tongue/throat swelling, SOB or lightheadedness with hypotension: Yes Has patient had a PCN reaction causing severe rash involving mucus membranes or skin necrosis: No Has patient had a PCN reaction that required hospitalization: Yes Has patient had a PCN reaction occurring within the last 10 years: No If all of the above answers are "NO", then may proceed with Cephalosporin use.   Reviewed with patient Family History  Problem Relation Age of Onset  . Diabetes Mother   . Diabetes  Father   . Diabetes Sister   . Diabetes Brother   . Colon cancer Neg Hx   . Stomach cancer Neg Hx     Prior to Admission medications   Medication Sig Start Date End Date Taking? Authorizing Provider  acetaminophen (TYLENOL) 325 MG tablet Take 2 tablets (650 mg total) by mouth every 4 (four) hours as needed for headache or mild pain. 02/05/16   Erlene Quan, PA-C  albuterol (PROVENTIL HFA;VENTOLIN HFA) 108 (90 BASE) MCG/ACT inhaler Inhale into the lungs every 6 (six) hours as needed for wheezing or shortness of breath.    [provider]  ALPRAZolam Duanne Moron) 0.5 MG tablet Take 0.5 mg by mouth 2 (two) times daily as needed for anxiety.    [provider]  amiodarone (PACERONE) 200 MG tablet Take 1 tablet (200 mg total) by mouth daily. 08/16/17   Wellington Hampshire, MD  apixaban (ELIQUIS) 2.5 MG TABS tablet Take 1 tablet by mouth twice daily, need to schedule MD appt for further refills 07/22/17   Wellington Hampshire, MD  atorvastatin (LIPITOR) 40 MG tablet Take 40 mg by mouth daily.    [provider]  cetirizine (ZYRTEC) 10 MG tablet Take 10 mg by mouth daily.    [provider]  diltiazem (CARDIZEM SR) 60 MG 12 hr capsule Take 60 mg daily by mouth. 04/16/17   [provider]  EPINEPHrine (EPI-PEN) 0.3 mg/0.3 mL SOAJ injection Inject 0.3 mg once as needed into the muscle (severe allergic reaction).     [provider]  Fluticasone-Salmeterol (ADVAIR) 100-50 MCG/DOSE AEPB Inhale 1 puff into the lungs every 12 (twelve) hours.    [provider]  Insulin Glargine (LANTUS SOLOSTAR) 100 UNIT/ML Solostar Pen Inject 18 Units daily before breakfast into the skin.    [provider]  lubiprostone (AMITIZA) 24 MCG capsule Take 1 capsule (24 mcg total) by mouth 2 (two) times daily with a meal. 12/01/17   Pyrtle, Lajuan Lines, MD  meclizine (ANTIVERT) 25 MG tablet Take 25 mg by mouth 3 (three) times daily as needed for dizziness.     [provider]  methocarbamol (ROBAXIN) 500 MG tablet Take 1 tablet (500 mg total) every 6 (six) hours as needed by mouth for muscle spasms. 04/27/17   Geradine Girt, DO  mometasone (NASONEX) 50 MCG/ACT nasal spray Place 2 sprays into the nose daily.    [provider]  montelukast (SINGULAIR) 10 MG tablet Take 10 mg at bedtime by mouth.  04/14/16   [provider]  olopatadine (PATANOL) 0.1 % ophthalmic solution Place 1 drop into both eyes 2 (two) times daily.     [provider]  pantoprazole (PROTONIX) 40 MG tablet Take 1 tablet (40 mg total) by mouth 2 (two) times daily. Take before breakfast and dinner 03/18/18   Levin Erp, PA  pregabalin (LYRICA) 75 MG capsule Take  75 mg by mouth 2 (two) times daily.    [provider]  Propylene Glycol (SYSTANE BALANCE OP) Place 1 drop daily as needed into both eyes (dryness).     [provider]  ranitidine (ZANTAC) 150 MG capsule TAKE 1 CAPSULE(150 MG) BY MOUTH EVERY EVENING 10/27/17   Pyrtle, Lajuan Lines, MD  traMADol (ULTRAM) 50 MG tablet Take 50 mg every 12 (twelve) hours as needed by mouth (pain).  09/18/15   [provider]    Physical Exam:  Constitutional: NAD, calm, comfortable, lying in bed Vitals:   04/23/18 0626 04/23/18 0645 04/23/18 0700 04/23/18 0759  BP: 119/85 133/85 137/90 121/82  Pulse: (!) 118 (!) 117 (!) 117 (!) 113  Resp: (!) 26  (!) 21 14  Temp: 98.1 F (36.7 C)     TempSrc: Oral     SpO2: 100% 100% 100% 96%  Weight:      Height:       Eyes: PERRL, lids and conjunctivae normal ENMT: Mucous membranes are moist.  Neck: normal, supple, no cervical adenopathy Respiratory: CTAB, no wheezing Cardiovascular: Tachycardic, but regular, no murmur Abdomen: + TTP epigastrium and LLQ.  +BS.  Non distended Musculoskeletal: thin extremities, no clubbing or cyanosis noted.  Skin: no rashes, lesions, ulcers on exposed skin Neurologic: CN 2-12 grossly intact. Strength 5/5 in  all 4.  Psychiatric: Normal judgment and insight. Alert and oriented x 3. Normal mood.    Labs on Admission: I have personally reviewed following labs and imaging studies  CBC: Recent Labs  Lab 04/23/18 0629  WBC 7.0  HGB 14.7  HCT 46.9*  MCV 85.1  PLT 355   Basic Metabolic Panel: Recent Labs  Lab 04/23/18 0629  NA 138  K 3.5  CL 105  CO2 21*  GLUCOSE 127*  BUN 17  CREATININE 1.67*  CALCIUM 9.6   GFR: Estimated Creatinine Clearance: 23.4 mL/min (A) (by C-G formula based on SCr of 1.67 mg/dL (H)). Liver Function Tests: Recent Labs  Lab 04/23/18 0629  AST 32  ALT 16  ALKPHOS 88  BILITOT 1.2  PROT 7.6  ALBUMIN 3.9   Recent Labs  Lab 04/23/18 0629  LIPASE 90*   No results for input(s): AMMONIA in the last 168 hours. Coagulation Profile: No results for input(s): INR, PROTIME in the last 168 hours. Cardiac Enzymes: No results for input(s): CKTOTAL, CKMB, CKMBINDEX, TROPONINI in the last 168 hours. BNP (last 3 results) No results for input(s): PROBNP in the last 8760 hours. HbA1C: No results for input(s): HGBA1C in the last 72 hours. CBG: No results for input(s): GLUCAP in the last 168 hours. Lipid Profile: No results for input(s): CHOL, HDL, LDLCALC, TRIG, CHOLHDL, LDLDIRECT in the last 72 hours. Thyroid Function Tests: No results for input(s): TSH, T4TOTAL, FREET4, T3FREE, THYROIDAB in the last 72 hours. Anemia Panel: No results for input(s): VITAMINB12, FOLATE, FERRITIN, TIBC, IRON, RETICCTPCT in the last 72 hours. Urine analysis:    Component Value Date/Time   COLORURINE YELLOW 04/26/2017 [place] 04/26/2017 1524   LABSPEC 1.010 04/26/2017 1524   PHURINE 6.0 04/26/2017 1524   GLUCOSEU NEGATIVE 04/26/2017 1524   HGBUR NEGATIVE 04/26/2017 1524   BILIRUBINUR NEGATIVE 04/26/2017 [place] 04/26/2017 1524   PROTEINUR NEGATIVE 04/26/2017 1524   UROBILINOGEN 0.2 09/08/2010 1330   NITRITE NEGATIVE 04/26/2017 1524    LEUKOCYTESUR MODERATE (A) 04/26/2017 1524    Radiological Exams on Admission: Dg Chest 2 View  Result Date: 04/23/2018  CLINICAL DATA:  Chest pressure and pain starting at 5 a.m. EXAM: CHEST - 2 VIEW COMPARISON:  Chest CT 04/23/2018 FINDINGS: Mild enlargement of the cardiopericardial silhouette without edema. Linear subsegmental atelectasis at the left lung base. Atherosclerotic calcification of the aortic arch. Mild dextroconvex lower thoracic scoliosis. No pleural effusion. IMPRESSION: 1. Mild enlargement of the cardiopericardial silhouette, without edema. 2. Mild left basilar subsegmental atelectasis or scarring. 3. Dextroconvex lower thoracic scoliosis. 4.  Aortic Atherosclerosis (ICD10-I70.0). Electronically Signed   By: Van Clines [name]   On: 04/23/2018 07:48   Ct Angio Chest/abd/pel For Dissection W And/or Wo Contrast  Addendum Date: 04/23/2018   ADDENDUM REPORT: 04/23/2018 08:02 ADDENDUM: There is also 2.4 cm hypoattenuating left thyroid nodule, an incidental finding of potential clinical significance. Consider further evaluation with thyroid ultrasound. If patient is clinically hyperthyroid, consider nuclear medicine thyroid uptake and scan. Electronically Signed   By: Lajean Manes [name]   On: 04/23/2018 08:02   Result Date: 04/23/2018 CLINICAL DATA:  Chest pressure and pain beginning early this morning. Dyspnea on exertion. Some nausea and vomiting. EXAM: CT ANGIOGRAPHY CHEST, ABDOMEN AND PELVIS TECHNIQUE: Multidetector CT imaging through the chest, abdomen and pelvis was performed using the standard protocol during bolus administration of intravenous contrast. Multiplanar reconstructed images and MIPs were obtained and reviewed to evaluate the vascular anatomy. CONTRAST:  111mL ISOVUE-370 IOPAMIDOL (ISOVUE-370) INJECTION 76% COMPARISON:  Chest radiograph 01/31/2016. Abdomen and pelvis CT, 08/11/2017. FINDINGS: CTA CHEST FINDINGS Cardiovascular: Heart is mildly enlarged. No pericardial  effusion. Mild left coronary artery calcifications. Great vessels normal in caliber. No aortic dissection. No atherosclerosis. Aortic arch branch vessels are widely patent. No evidence of a pulmonary embolism. Mediastinum/Nodes: 2.4 cm hypoattenuating left thyroid nodule. No neck base or axillary masses or enlarged lymph nodes. No mediastinal or hilar masses or adenopathy. Trachea and esophagus are unremarkable. Lungs/Pleura: Mild bilateral dependent reticular and hazy peribronchovascular opacities, most likely atelectasis. Study obtained with low lung volumes/expiration. No convincing pneumonia or pulmonary edema. No lung mass or nodule. No pleural effusion or pneumothorax. Musculoskeletal: No fracture or acute finding. No osteoblastic or osteolytic lesions. Review of the MIP images confirms the above findings. CTA ABDOMEN AND PELVIS FINDINGS VASCULAR Aorta: Aorta is normal in caliber. No dissection. Mild atherosclerosis. No significant stenosis. Celiac: Mild plaque at the origin. No significant stenosis. No dissection, aneurysm or vasculitis. SMA: Mild plaque at the origin. No significant stenosis. No dissection, aneurysm or vasculitis. Renals: Single right and 2 left renal arteries. Mild plaque at the origin of the right renal artery without significant stenosis. No dissection, vasculitis, aneurysm or fibromuscular dysplasia. IMA: Minor plaque at the origin without significant stenosis. Inflow: Bilateral common iliac atherosclerotic plaque without significant stenosis. External iliac arteries are widely patent. Veins: No obvious venous abnormality within the limitations of this arterial phase study. Review of the MIP images confirms the above findings. NON-VASCULAR Hepatobiliary: Liver normal in overall size. There is central volume loss with some anterior surface nodularity, findings raising the possibility of cirrhosis. No liver mass or focal lesion. Gallbladder surgically absent. Common bile duct dilated to 1  cm proximally with normal distal tapering. This is presumed chronic. Pancreas: Unremarkable. No pancreatic ductal dilatation or surrounding inflammatory changes. Spleen: Normal in size without focal abnormality. Adrenals/Urinary Tract: No adrenal masses. Bilateral renal cortical thinning with reduced renal sizes, right 8.4 cm in length, left 8.8 cm in length. 8 mm low-attenuation lesion in the posterior midpole, left kidney, likely a cyst. No other renal masses or  lesions, no stones and no hydronephrosis. Ureters are normal in course and in caliber. Bladder is unremarkable. Stomach/Bowel: Stomach is within normal limits. Appendix appears normal. No evidence of bowel wall thickening, distention, or inflammatory changes. Lymphatic: No adenopathy. Reproductive: Uterus and bilateral adnexa are unremarkable. Other: No abdominal wall hernia or abnormality. No abdominopelvic ascites. Musculoskeletal: No fracture or acute finding. No osteoblastic or osteolytic lesions. There are degenerative changes noted throughout the thoracolumbar spine with an S-shaped scoliosis. Review of the MIP images confirms the above findings. IMPRESSION: CTA FINDINGS 1. No aortic aneurysm or dissection.  No acute findings. 2. Atherosclerotic changes along the abdominal aorta. No significant stenosis. Branch vessels are widely patent. NON CTA FINDINGS 1. No acute findings in the chest, abdomen or pelvis. 2. Mild cardiomegaly.  Mild left coronary artery calcifications. 3. Lungs show mild dependent opacity that is most likely atelectasis. 4. Morphologic changes of the liver raise the possibility of cirrhosis. 5. Bilateral renal cortical thinning with read Tues renal sizes consistent with medical renal disease. Electronically Signed: By: Lajean Manes [name] On: 04/23/2018 07:52    EKG: Independently reviewed. Sloping of ST segment in infero-lateral leads  Assessment/Plan Chest pressure, nausea, vomiting - DDX includes GI pathology and cardiac  pathology.  She has had nausea/vomiting for 1 day and inability to take her pills including reflux medication and others.  She is now tachycardic.  However, EKG is changed from previous.  Despite report of 8/10 chest pain, she is very comfortable on exam and able to ambulate easily.  - Repeat EKG, if changes persistent or worsened, will call cardiology - Trend Troponin - Telemetry - GI cocktail and nitroglycerin PRN - Morphine PRN for severe pain - IV protonix X 1 - Restart amiodarone at previous dose - Will change medications to IV if unable to tolerate PO - Nausea control with zofran    Type 2 diabetes with stage 3 chronic kidney disease GFR 30-59 - She reports not being on any medications, SSI ordered    Hyperlipidemia - Home atorvastatin    Essential hypertension - Monitor BP and pulse closely - Continue diltiazem    Chronic diastolic heart failure - No sign of volume overload, work up for chest pressure as noted above - CTA chest without pathology - Related to Afib from review of chart - Restart diltiazem when able    IBS - Chronic issue for her, she is on amitiza and we will restart this    Chronic renal insufficiency, stage III (moderate) - Renal function is stable, monitor closely     Asthma - Continue home zyrtec, advair, singulair  Arthritis - Continue home pregabalin and tramadol - Morphine for breakthrough pain  Anxiety - Continue home xanax  Atrial fibrillation/flutter - Restart home amiodarone and diltiazem, I think she is tachycardic from not taking her medications for the last day - Telemetry - Work up chest pressure, pain as above.   Thyroid nodule - Found on CTA of chest/abdomen/pelvis - Follow up outpatient with TFTs and thyroid ultrasound.   UPDATE 11:30am  Re-evaluated patient who remains in ED.  Complaining now of mostly LLQ pain which is worse and radiates to chest.  I am more convinced for an abdominal pathology - gastroenteritis vs  diverticulitis vs kidney stone (though CT did not show such pathology).  Will change to Telemetry bed, patient getting morphine from nursing.  Asked for repeat EKG which I had requested for evaluation of change.  Will also consider lower abdominal pathology such  as UTI and request a UA to be performed.     DVT prophylaxis: Eliquis Code Status: Full Disposition Plan: Admit to SDU given active chest pain Consults called: None Admission status: SDU, Obs   Gilles Chiquito MD [facility] Pager [phone]  If 7PM-7AM, please contact night-coverage [email or web address] Password [email or web address]  04/23/2018, 10:22 AM

## 2018-04-23 NOTE — Progress Notes (Addendum)
CRITICAL VALUE ALERT  Critical Value:  Troponin 0.06 (down from 0.07)  Date & Time Notied:  04/23/18 1:58 PM  Provider Notified: E.Mullen,MD   Orders Received/Actions taken: STAT EKG

## 2018-04-23 NOTE — ED Notes (Signed)
Admitting MD bedside

## 2018-04-23 NOTE — Progress Notes (Signed)
ANTICOAGULATION CONSULT NOTE - Initial Consult  Pharmacy Consult for heparin Indication: chest pain/ACS  Allergies  Allergen Reactions  . Codeine Nausea Only  . Metformin Diarrhea  . Amoxicillin Rash  . Biaxin [Clarithromycin] Rash  . Ciprofloxacin Rash  . Penicillins Rash    Childhood allergic reaction Has patient had a PCN reaction causing immediate rash, facial/tongue/throat swelling, SOB or lightheadedness with hypotension: Yes Has patient had a PCN reaction causing severe rash involving mucus membranes or skin necrosis: No Has patient had a PCN reaction that required hospitalization: Yes Has patient had a PCN reaction occurring within the last 10 years: No If all of the above answers are "NO", then may proceed with Cephalosporin use.    Patient Measurements: Height: 5\' 6"  (167.6 cm) Weight: 152 lb (68.9 kg)(Scale A) IBW/kg (Calculated) : 59.3 Heparin Dosing Weight: 69 kg  Vital Signs: Temp: 98.5 F (36.9 C) (11/09 1218) Temp Source: Oral (11/09 1218) BP: 131/79 (11/09 1218) Pulse Rate: 114 (11/09 1218)  Labs: Recent Labs    04/23/18 0629 04/23/18 1242  HGB 14.7  --   HCT 46.9*  --   PLT 316  --   CREATININE 1.67*  --   TROPONINI  --  0.06*    Estimated Creatinine Clearance: 24.3 mL/min (A) (by C-G formula based on SCr of 1.67 mg/dL (H)).   Medical History: Past Medical History:  Diagnosis Date  . Allergy   . Anxiety   . Arthritis   . Asthma   . Atrial fibrillation ([place])   . Atrial flutter ([place])   . Blood transfusion without reported diagnosis   . Cataract   . Chronic diastolic CHF (congestive heart failure) ([place])   . Diabetes mellitus   . Fibromyalgia   . GERD (gastroesophageal reflux disease)   . Hyperlipemia   . Hypertension   . IBS (irritable bowel syndrome)   . Stroke [facility]) 2013   TIA  . Tubular adenoma of colon      Assessment: 34 yoF on Eliquis PTA for PAF now with EKG changes. Pt to start on IV heparin, last dose of apixaban was  11/8 prior to admission.  Goal of Therapy:  Heparin level 0.3-0.7 units/ml aPTT 66-102 seconds seconds Monitor platelets by anticoagulation protocol: Yes   Plan:  -Heparin 850 units/hr -Check 8-hr heparin level and aPTT -Monitor heparin level, CBC, S/Sx bleeding daily   Arrie Senate, PharmD, BCPS Clinical Pharmacist [phone] Please check AMION for all [place] numbers 04/23/2018

## 2018-04-23 NOTE — Progress Notes (Signed)
Called by Nurse Caryl Pina about EKG.  Reviewed EKG and changes now with ST depression in lateral leads.  TnI flat at 0.07 and 0.06.  Discussed with Dr. Einar Gip in Cardiology who will review chart.  Will plan to start heparin drip tonight. Continue to trend enzymes.  Chest pain is now resolved per nursing.  If chest pain recurs, consider nitro drip.  Cardiology to follow and give further recommendations.  If she does well this evening, Dr. Einar Gip may see tomorrow morning.   Gilles Chiquito, MD

## 2018-04-23 NOTE — Progress Notes (Addendum)
Page to MD to request transfer for cardizem gtt order as it is outside of [facility] scope.

## 2018-04-23 NOTE — ED Provider Notes (Signed)
[facility] DEPARTMENT Provider Note   CSN: [id number] Arrival date & time: 04/23/18  0617     History   Chief Complaint Chief Complaint  Patient presents with  . Chest Pain    HPI Kerry Barnes is a 84 y.o. female.  The history is provided by the patient and medical records. No language interpreter was used.  Chest Pain       84 year old female with history of diabetes, chronic kidney disease, pulmonary hypertension, CHF with EF 65-70%, prior stroke, atrial fibrillation currently on Eliquis, asthma presenting to ED from home for evaluation of chest pain.  Patient reports she was awoke this morning approximately an hour and a half ago with pain from her abdomen to her chest.  She described pain as a tightness pressure sensation, persistent, with nausea, and occasional cough.  Pain is moderate to severe nothing seems to make it better or worse.  No specific treatment tried.  No associated fever or chills, denies any shortness of breath, back pain, dysuria, focal numbness or weakness.  She mentioned having similar pain like this in the past.  She also mention having history of CHF but denies any increased fluid gain.  States she did not eat much last night.  She denies any recent medication changes and has been compliant with her medication.  Past Medical History:  Diagnosis Date  . Allergy   . Anxiety   . Arthritis   . Asthma   . Atrial fibrillation ([place])   . Atrial flutter ([place])   . Blood transfusion without reported diagnosis   . Cataract   . Chronic diastolic CHF (congestive heart failure) ([place])   . Diabetes mellitus   . Fibromyalgia   . GERD (gastroesophageal reflux disease)   . Hyperlipemia   . Hypertension   . IBS (irritable bowel syndrome)   . Stroke [facility]) 2013   TIA  . Tubular adenoma of colon     Patient Active Problem List   Diagnosis Date Noted  . AKI (acute kidney injury) ([place]) 04/26/2017  . Chronic renal insufficiency,  stage III (moderate) ([place]) 04/17/2016  . Chronic anticoagulation 02/05/2016  . Chest pain   . Acute kidney injury superimposed on CKD ([place])   . Atrial flutter with rapid ventricular response ([place]) 01/31/2016  . Asthma with acute exacerbation 09/10/2015  . Difficulty swallowing 09/10/2015  . History of acute bronchitis 09/10/2015  . Moderate persistent asthma 09/10/2015  . Sore throat 09/10/2015  . Left carotid bruit 04/23/2015  . Leg pain 10/10/2013  . Stroke ([place]) 06/16/2011  . Chronic diastolic heart failure ([place]) 11/06/2009  . PULMONARY HYPERTENSION, SECONDARY 07/04/2009  . ARM NUMBNESS 07/04/2009  . Type 2 diabetes with stage 3 chronic kidney disease GFR 30-59 ([place]) 07/03/2009  . Hyperlipidemia 07/03/2009  . MITRAL REGURGITATION, MILD 07/03/2009  . Essential hypertension 07/03/2009  . Gastroparesis 07/03/2009  . DIVERTICULOSIS, COLON, WITH HEMORRHAGE 07/03/2009  . CONSTIPATION 07/03/2009  . IBS 07/03/2009  . ARTHRITIS 07/03/2009  . MIGRAINES, HX OF 07/03/2009  . History of disease 07/03/2009  . EUSTACHIAN TUBE DYSFUNCTION 04/04/2007  . ALLERGIC RHINITIS 04/04/2007  . ASTHMA 04/04/2007  . ESOPHAGITIS, REFLUX 04/04/2007  . Asthma 04/04/2007    Past Surgical History:  Procedure Laterality Date  . ANKLE FRACTURE SURGERY    . CARDIOVERSION N/A 02/04/2016   Procedure: CARDIOVERSION;  Surgeon: Sueanne Margarita, MD;  Location: [place] ENDOSCOPY;  Service: Cardiovascular;  Laterality: N/A;  . CARDIOVERSION N/A 05/01/2016   Procedure: CARDIOVERSION;  Surgeon: Fay Records, MD;  Location: [facility] ENDOSCOPY;  Service: Cardiovascular;  Laterality: N/A;  . CHOLECYSTECTOMY    . TEE WITHOUT CARDIOVERSION N/A 02/04/2016   Procedure: TRANSESOPHAGEAL ECHOCARDIOGRAM (TEE);  Surgeon: Sueanne Margarita, MD;  Location: [facility] ENDOSCOPY;  Service: Cardiovascular;  Laterality: N/A;  . TUBAL LIGATION    . WRIST FRACTURE SURGERY       OB History   None      Home Medications    Prior to Admission  medications   Medication Sig Start Date End Date Taking? Authorizing Provider  acetaminophen (TYLENOL) 325 MG tablet Take 2 tablets (650 mg total) by mouth every 4 (four) hours as needed for headache or mild pain. 02/05/16   Erlene Quan, PA-C  albuterol (PROVENTIL HFA;VENTOLIN HFA) 108 (90 BASE) MCG/ACT inhaler Inhale into the lungs every 6 (six) hours as needed for wheezing or shortness of breath.    [provider]  ALPRAZolam Duanne Moron) 0.5 MG tablet Take 0.5 mg by mouth 2 (two) times daily as needed for anxiety.    [provider]  amiodarone (PACERONE) 200 MG tablet Take 1 tablet (200 mg total) by mouth daily. 08/16/17   Wellington Hampshire, MD  apixaban (ELIQUIS) 2.5 MG TABS tablet Take 1 tablet by mouth twice daily, need to schedule MD appt for further refills 07/22/17   Wellington Hampshire, MD  atorvastatin (LIPITOR) 40 MG tablet Take 40 mg by mouth daily.    [provider]  cetirizine (ZYRTEC) 10 MG tablet Take 10 mg by mouth daily.    [provider]  diltiazem (CARDIZEM SR) 60 MG 12 hr capsule Take 60 mg daily by mouth. 04/16/17   [provider]  EPINEPHrine (EPI-PEN) 0.3 mg/0.3 mL SOAJ injection Inject 0.3 mg once as needed into the muscle (severe allergic reaction).     [provider]  Fluticasone-Salmeterol (ADVAIR) 100-50 MCG/DOSE AEPB Inhale 1 puff into the lungs every 12 (twelve) hours.    [provider]  Insulin Glargine (LANTUS SOLOSTAR) 100 UNIT/ML Solostar Pen Inject 18 Units daily before breakfast into the skin.    [provider]  lubiprostone (AMITIZA) 24 MCG capsule Take 1 capsule (24 mcg total) by mouth 2 (two) times daily with a meal. 12/01/17   Pyrtle, Lajuan Lines, MD  meclizine (ANTIVERT) 25 MG tablet Take 25 mg by mouth 3 (three) times daily as needed for dizziness.     [provider]  methocarbamol (ROBAXIN) 500 MG tablet Take 1 tablet (500 mg total) every 6 (six) hours as needed by mouth for muscle  spasms. 04/27/17   Geradine Girt, DO  mometasone (NASONEX) 50 MCG/ACT nasal spray Place 2 sprays into the nose daily.    [provider]  montelukast (SINGULAIR) 10 MG tablet Take 10 mg at bedtime by mouth.  04/14/16   [provider]  olopatadine (PATANOL) 0.1 % ophthalmic solution Place 1 drop into both eyes 2 (two) times daily.     [provider]  pantoprazole (PROTONIX) 40 MG tablet Take 1 tablet (40 mg total) by mouth 2 (two) times daily. Take before breakfast and dinner 03/18/18   Levin Erp, PA  pregabalin (LYRICA) 75 MG capsule Take 75 mg by mouth 2 (two) times daily.    [provider]  Propylene Glycol (SYSTANE BALANCE OP) Place 1 drop daily as needed into both eyes (dryness).     [provider]  ranitidine (ZANTAC) 150 MG capsule TAKE 1 CAPSULE(150 MG)  BY MOUTH EVERY EVENING 10/27/17   Pyrtle, Lajuan Lines, MD  traMADol (ULTRAM) 50 MG tablet Take 50 mg every 12 (twelve) hours as needed by mouth (pain).  09/18/15   [provider]    Family History Family History  Problem Relation Age of Onset  . Diabetes Mother   . Diabetes Father   . Diabetes Sister   . Diabetes Brother   . Colon cancer Neg Hx   . Stomach cancer Neg Hx     Social History Social History   Tobacco Use  . Smoking status: Never Smoker  . Smokeless tobacco: Never Used  Substance Use Topics  . Alcohol use: No    Alcohol/week: 0.0 standard drinks  . Drug use: No     Allergies   Codeine; Metformin; Amoxicillin; Biaxin [clarithromycin]; Ciprofloxacin; and Penicillins   Review of Systems Review of Systems  Cardiovascular: Positive for chest pain.  All other systems reviewed and are negative.    Physical Exam Updated Vital Signs There were no vitals taken for this visit.  Physical Exam  Constitutional: She appears well-developed and well-nourished. No distress (Patient appears uncomfortable, moaning).  HENT:  Head: Atraumatic.  Eyes:  Conjunctivae are normal.  Neck: Neck supple.  Cardiovascular: Intact distal pulses and normal pulses.  Tachycardia without murmur rubs or gallops  Pulmonary/Chest: Effort normal and breath sounds normal.  Abdominal: Soft. Bowel sounds are normal. There is tenderness.  Musculoskeletal: Normal range of motion.       Right lower leg: She exhibits edema (Trace edema).       Left lower leg: She exhibits edema (Trace edema).  Neurological: She is alert.  Skin: No rash noted.  Psychiatric: She has a normal mood and affect.  Nursing note and vitals reviewed.    ED Treatments / Results  Labs (all labs ordered are listed, but only abnormal results are displayed) Labs Reviewed  BASIC METABOLIC PANEL - Abnormal; Notable for the following components:      Result Value   CO2 21 (*)    Glucose, Bld 127 (*)    Creatinine, Ser 1.67 (*)    GFR calc non Af Amer 27 (*)    GFR calc Af Amer 32 (*)    All other components within normal limits  CBC - Abnormal; Notable for the following components:   RBC 5.51 (*)    HCT 46.9 (*)    All other components within normal limits  BRAIN NATRIURETIC PEPTIDE - Abnormal; Notable for the following components:   B Natriuretic Peptide 285.0 (*)    All other components within normal limits  HEPATIC FUNCTION PANEL - Abnormal; Notable for the following components:   Indirect Bilirubin 1.0 (*)    All other components within normal limits  LIPASE, BLOOD - Abnormal; Notable for the following components:   Lipase 90 (*)    All other components within normal limits  I-STAT TROPONIN, ED    EKG EKG Interpretation  Date/Time:  Saturday April 23 2018 06:43:21 EST Ventricular Rate:  117 PR Interval:    QRS Duration: 108 QT Interval:  370 QTC Calculation: 517 R Axis:   65 Text Interpretation:  Sinus or ectopic atrial tachycardia Repol abnrm, severe global ischemia (LM/MVD) Prolonged QT interval st changes likely rate related Confirmed by Deno Etienne [phone]) on  04/23/2018 7:02:45 AM   Radiology Dg Chest 2 View  Result Date: 04/23/2018 CLINICAL DATA:  Chest pressure and pain starting at 5 a.m. EXAM: CHEST - 2 VIEW COMPARISON:  Chest CT 04/23/2018 FINDINGS: Mild enlargement of the cardiopericardial silhouette without edema. Linear subsegmental atelectasis at the left lung base. Atherosclerotic calcification of the aortic arch. Mild dextroconvex lower thoracic scoliosis. No pleural effusion. IMPRESSION: 1. Mild enlargement of the cardiopericardial silhouette, without edema. 2. Mild left basilar subsegmental atelectasis or scarring. 3. Dextroconvex lower thoracic scoliosis. 4.  Aortic Atherosclerosis (ICD10-I70.0). Electronically Signed   By: Van Clines [name]   On: 04/23/2018 07:48   Ct Angio Chest/abd/pel For Dissection W And/or Wo Contrast  Addendum Date: 04/23/2018   ADDENDUM REPORT: 04/23/2018 08:02 ADDENDUM: There is also 2.4 cm hypoattenuating left thyroid nodule, an incidental finding of potential clinical significance. Consider further evaluation with thyroid ultrasound. If patient is clinically hyperthyroid, consider nuclear medicine thyroid uptake and scan. Electronically Signed   By: Lajean Manes [name]   On: 04/23/2018 08:02   Result Date: 04/23/2018 CLINICAL DATA:  Chest pressure and pain beginning early this morning. Dyspnea on exertion. Some nausea and vomiting. EXAM: CT ANGIOGRAPHY CHEST, ABDOMEN AND PELVIS TECHNIQUE: Multidetector CT imaging through the chest, abdomen and pelvis was performed using the standard protocol during bolus administration of intravenous contrast. Multiplanar reconstructed images and MIPs were obtained and reviewed to evaluate the vascular anatomy. CONTRAST:  165mL ISOVUE-370 IOPAMIDOL (ISOVUE-370) INJECTION 76% COMPARISON:  Chest radiograph 01/31/2016. Abdomen and pelvis CT, 08/11/2017. FINDINGS: CTA CHEST FINDINGS Cardiovascular: Heart is mildly enlarged. No pericardial effusion. Mild left coronary artery  calcifications. Great vessels normal in caliber. No aortic dissection. No atherosclerosis. Aortic arch branch vessels are widely patent. No evidence of a pulmonary embolism. Mediastinum/Nodes: 2.4 cm hypoattenuating left thyroid nodule. No neck base or axillary masses or enlarged lymph nodes. No mediastinal or hilar masses or adenopathy. Trachea and esophagus are unremarkable. Lungs/Pleura: Mild bilateral dependent reticular and hazy peribronchovascular opacities, most likely atelectasis. Study obtained with low lung volumes/expiration. No convincing pneumonia or pulmonary edema. No lung mass or nodule. No pleural effusion or pneumothorax. Musculoskeletal: No fracture or acute finding. No osteoblastic or osteolytic lesions. Review of the MIP images confirms the above findings. CTA ABDOMEN AND PELVIS FINDINGS VASCULAR Aorta: Aorta is normal in caliber. No dissection. Mild atherosclerosis. No significant stenosis. Celiac: Mild plaque at the origin. No significant stenosis. No dissection, aneurysm or vasculitis. SMA: Mild plaque at the origin. No significant stenosis. No dissection, aneurysm or vasculitis. Renals: Single right and 2 left renal arteries. Mild plaque at the origin of the right renal artery without significant stenosis. No dissection, vasculitis, aneurysm or fibromuscular dysplasia. IMA: Minor plaque at the origin without significant stenosis. Inflow: Bilateral common iliac atherosclerotic plaque without significant stenosis. External iliac arteries are widely patent. Veins: No obvious venous abnormality within the limitations of this arterial phase study. Review of the MIP images confirms the above findings. NON-VASCULAR Hepatobiliary: Liver normal in overall size. There is central volume loss with some anterior surface nodularity, findings raising the possibility of cirrhosis. No liver mass or focal lesion. Gallbladder surgically absent. Common bile duct dilated to 1 cm proximally with normal distal  tapering. This is presumed chronic. Pancreas: Unremarkable. No pancreatic ductal dilatation or surrounding inflammatory changes. Spleen: Normal in size without focal abnormality. Adrenals/Urinary Tract: No adrenal masses. Bilateral renal cortical thinning with reduced renal sizes, right 8.4 cm in length, left 8.8 cm in length. 8 mm low-attenuation lesion in the posterior midpole, left kidney, likely a cyst. No other renal masses or lesions, no stones and no hydronephrosis. Ureters are normal in course and in caliber. Bladder is unremarkable. Stomach/Bowel:  Stomach is within normal limits. Appendix appears normal. No evidence of bowel wall thickening, distention, or inflammatory changes. Lymphatic: No adenopathy. Reproductive: Uterus and bilateral adnexa are unremarkable. Other: No abdominal wall hernia or abnormality. No abdominopelvic ascites. Musculoskeletal: No fracture or acute finding. No osteoblastic or osteolytic lesions. There are degenerative changes noted throughout the thoracolumbar spine with an S-shaped scoliosis. Review of the MIP images confirms the above findings. IMPRESSION: CTA FINDINGS 1. No aortic aneurysm or dissection.  No acute findings. 2. Atherosclerotic changes along the abdominal aorta. No significant stenosis. Branch vessels are widely patent. NON CTA FINDINGS 1. No acute findings in the chest, abdomen or pelvis. 2. Mild cardiomegaly.  Mild left coronary artery calcifications. 3. Lungs show mild dependent opacity that is most likely atelectasis. 4. Morphologic changes of the liver raise the possibility of cirrhosis. 5. Bilateral renal cortical thinning with read Tues renal sizes consistent with medical renal disease. Electronically Signed: By: Lajean Manes [name] On: 04/23/2018 07:52    Procedures Procedures (including critical care time)  Medications Ordered in ED Medications  morphine 4 MG/ML injection 6 mg (6 mg Intravenous Given 04/23/18 0644)  ondansetron (ZOFRAN) injection 4  mg (4 mg Intravenous Given 04/23/18 0643)  alum & mag hydroxide-simeth (MAALOX/MYLANTA) 200-200-20 MG/5ML suspension 30 mL (30 mLs Oral Given 04/23/18 0643)  sodium chloride 0.9 % bolus 1,000 mL (1,000 mLs Intravenous New Bag/Given 04/23/18 0759)  iopamidol (ISOVUE-370) 76 % injection 100 mL (100 mLs Intravenous Contrast Given 04/23/18 0709)     Initial Impression / Assessment and Plan / ED Course  I have reviewed the triage vital signs and the nursing notes.  Pertinent labs & imaging results that were available during my care of the patient were reviewed by me and considered in my medical decision making (see chart for details).     BP 121/82 (BP Location: Right Arm)   Pulse (!) 113   Temp 98.1 F (36.7 C) (Oral)   Resp 14   Ht 5\' 1"  (1.549 m)   Wt 70.8 kg   SpO2 96%   BMI 29.48 kg/m    Final Clinical Impressions(s) / ED Diagnoses   Final diagnoses:  Atypical chest pain  Epigastric abdominal pain    ED Discharge Orders    None     6:38 AM Patient here with pain to her abdomen and chest that started early this morning.  She appears uncomfortable.  She does have tenderness to mid abdomen on examination as well as tenderness to her mid chest.  She has intact distal pulses.  She appears to be tachycardic likely secondary to pain.  Does have history of atrial fibrillation currently on Eliquis.  No known history of AAA but does have significant risk factor for cardiac disease.  Will provide pain medication, work-up initiated, care discussed with Dr. Tyrone Nine.  8:15 AM BNP is 285, elevated from prior value, renal function with a creatinine of 1.67 and a GFR of 27.  Normal WBC, normal H&H, troponin is 0.07 still within normal limits.  Normal hepatic function panel, mildly elevated lipase of 90.  Initial chest x-ray shows mild enlargement of the pericardial silhouette without edema.  Chest abdomen pelvis CT angiogram dissection study shows no acute finding in the chest abdomen or pelvis.   Patient report improvement of her symptoms with pain medication.  HEART score of 5, moderate risk of MACE.  Pain is mostly resolved.    8:33 AM Appreciate consultation with [facility] who agrees to see and  admit pt for chest pain rule out.  Suspect GI source.     Domenic Moras, PA-C 04/23/18 [place], [place], DO 04/24/18 [phone]

## 2018-04-24 ENCOUNTER — Encounter (HOSPITAL_COMMUNITY): Payer: Self-pay | Admitting: Cardiology

## 2018-04-24 ENCOUNTER — Inpatient Hospital Stay (HOSPITAL_COMMUNITY): Payer: PPO

## 2018-04-24 DIAGNOSIS — I272 Pulmonary hypertension, unspecified: Secondary | ICD-10-CM | POA: Diagnosis present

## 2018-04-24 DIAGNOSIS — I4891 Unspecified atrial fibrillation: Secondary | ICD-10-CM | POA: Diagnosis present

## 2018-04-24 DIAGNOSIS — R7989 Other specified abnormal findings of blood chemistry: Secondary | ICD-10-CM | POA: Diagnosis not present

## 2018-04-24 DIAGNOSIS — I484 Atypical atrial flutter: Secondary | ICD-10-CM | POA: Diagnosis present

## 2018-04-24 DIAGNOSIS — Z7951 Long term (current) use of inhaled steroids: Secondary | ICD-10-CM | POA: Diagnosis not present

## 2018-04-24 DIAGNOSIS — N183 Chronic kidney disease, stage 3 (moderate): Secondary | ICD-10-CM | POA: Diagnosis present

## 2018-04-24 DIAGNOSIS — E1122 Type 2 diabetes mellitus with diabetic chronic kidney disease: Secondary | ICD-10-CM | POA: Diagnosis present

## 2018-04-24 DIAGNOSIS — Z8673 Personal history of transient ischemic attack (TIA), and cerebral infarction without residual deficits: Secondary | ICD-10-CM | POA: Diagnosis not present

## 2018-04-24 DIAGNOSIS — I471 Supraventricular tachycardia: Secondary | ICD-10-CM | POA: Diagnosis present

## 2018-04-24 DIAGNOSIS — I5032 Chronic diastolic (congestive) heart failure: Secondary | ICD-10-CM | POA: Diagnosis present

## 2018-04-24 DIAGNOSIS — F419 Anxiety disorder, unspecified: Secondary | ICD-10-CM | POA: Diagnosis present

## 2018-04-24 DIAGNOSIS — Z833 Family history of diabetes mellitus: Secondary | ICD-10-CM | POA: Diagnosis not present

## 2018-04-24 DIAGNOSIS — Z885 Allergy status to narcotic agent status: Secondary | ICD-10-CM | POA: Diagnosis not present

## 2018-04-24 DIAGNOSIS — I2510 Atherosclerotic heart disease of native coronary artery without angina pectoris: Secondary | ICD-10-CM | POA: Diagnosis present

## 2018-04-24 DIAGNOSIS — K559 Vascular disorder of intestine, unspecified: Secondary | ICD-10-CM

## 2018-04-24 DIAGNOSIS — R0789 Other chest pain: Secondary | ICD-10-CM | POA: Diagnosis not present

## 2018-04-24 DIAGNOSIS — E785 Hyperlipidemia, unspecified: Secondary | ICD-10-CM | POA: Diagnosis present

## 2018-04-24 DIAGNOSIS — Z794 Long term (current) use of insulin: Secondary | ICD-10-CM | POA: Diagnosis not present

## 2018-04-24 DIAGNOSIS — J45909 Unspecified asthma, uncomplicated: Secondary | ICD-10-CM | POA: Diagnosis present

## 2018-04-24 DIAGNOSIS — K219 Gastro-esophageal reflux disease without esophagitis: Secondary | ICD-10-CM | POA: Diagnosis present

## 2018-04-24 DIAGNOSIS — R1013 Epigastric pain: Secondary | ICD-10-CM | POA: Diagnosis present

## 2018-04-24 DIAGNOSIS — Z880 Allergy status to penicillin: Secondary | ICD-10-CM | POA: Diagnosis not present

## 2018-04-24 DIAGNOSIS — Z7901 Long term (current) use of anticoagulants: Secondary | ICD-10-CM | POA: Diagnosis not present

## 2018-04-24 DIAGNOSIS — Z9049 Acquired absence of other specified parts of digestive tract: Secondary | ICD-10-CM | POA: Diagnosis not present

## 2018-04-24 DIAGNOSIS — Z881 Allergy status to other antibiotic agents status: Secondary | ICD-10-CM | POA: Diagnosis not present

## 2018-04-24 DIAGNOSIS — R1032 Left lower quadrant pain: Secondary | ICD-10-CM | POA: Diagnosis not present

## 2018-04-24 DIAGNOSIS — M797 Fibromyalgia: Secondary | ICD-10-CM | POA: Diagnosis present

## 2018-04-24 DIAGNOSIS — M1990 Unspecified osteoarthritis, unspecified site: Secondary | ICD-10-CM | POA: Diagnosis present

## 2018-04-24 DIAGNOSIS — I130 Hypertensive heart and chronic kidney disease with heart failure and stage 1 through stage 4 chronic kidney disease, or unspecified chronic kidney disease: Secondary | ICD-10-CM | POA: Diagnosis present

## 2018-04-24 LAB — APTT
aPTT: 125 seconds — ABNORMAL HIGH (ref 24–36)
aPTT: 108 seconds — ABNORMAL HIGH (ref 24–36)

## 2018-04-24 LAB — HEPARIN LEVEL (UNFRACTIONATED): Heparin Unfractionated: 1.18 IU/mL — ABNORMAL HIGH (ref 0.30–0.70)

## 2018-04-24 LAB — GLUCOSE, CAPILLARY
Glucose-Capillary: 146 mg/dL — ABNORMAL HIGH (ref 70–99)
Glucose-Capillary: 139 mg/dL — ABNORMAL HIGH (ref 70–99)
Glucose-Capillary: 112 mg/dL — ABNORMAL HIGH (ref 70–99)
Glucose-Capillary: 142 mg/dL — ABNORMAL HIGH (ref 70–99)

## 2018-04-24 LAB — TROPONIN I
Troponin I: 0.06 ng/mL (ref <0.03)
Troponin I: 0.04 ng/mL (ref <0.03)

## 2018-04-24 LAB — LACTIC ACID, PLASMA
Lactic Acid, Venous: 2.3 mmol/L (ref 0.5–1.9)
Lactic Acid, Venous: 1.9 mmol/L (ref 0.5–1.9)
Lactic Acid, Venous: 1.8 mmol/L (ref 0.5–1.9)

## 2018-04-24 MED ORDER — DILTIAZEM HCL ER 60 MG PO CP12
60.0000 mg | ORAL_CAPSULE | Freq: Every day | ORAL | Status: DC
  Administered 2018-04-24: 60 mg via ORAL
  Filled 2018-04-24 (×2): qty 1

## 2018-04-24 MED ORDER — SUCRALFATE 1 GM/10ML PO SUSP
1.0000 g | Freq: Three times a day (TID) | ORAL | Status: DC
  Administered 2018-04-24 – 2018-04-26 (×8): 1 g via ORAL
  Filled 2018-04-24 (×9): qty 10

## 2018-04-24 MED ORDER — SODIUM CHLORIDE 0.9% FLUSH
3.0000 mL | Freq: Two times a day (BID) | INTRAVENOUS | Status: DC
  Administered 2018-04-24 – 2018-04-26 (×4): 3 mL via INTRAVENOUS

## 2018-04-24 MED ORDER — SODIUM CHLORIDE 0.9 % IV SOLN
250.0000 mL | INTRAVENOUS | Status: DC
  Administered 2018-04-25: 13:00:00 via INTRAVENOUS

## 2018-04-24 MED ORDER — NITROGLYCERIN 0.4 MG SL SUBL
SUBLINGUAL_TABLET | SUBLINGUAL | Status: AC
  Administered 2018-04-24: 0.4 mg
  Filled 2018-04-24: qty 3

## 2018-04-24 MED ORDER — SODIUM CHLORIDE 0.9% FLUSH: 3.0000 mL | INTRAVENOUS | Status: DC | PRN

## 2018-04-24 MED ORDER — PANTOPRAZOLE SODIUM 40 MG PO TBEC
40.0000 mg | DELAYED_RELEASE_TABLET | Freq: Two times a day (BID) | ORAL | Status: DC
  Administered 2018-04-24 – 2018-04-26 (×5): 40 mg via ORAL
  Filled 2018-04-24 (×5): qty 1

## 2018-04-24 MED ORDER — LEVALBUTEROL HCL 0.63 MG/3ML IN NEBU: 0.6300 mg | INHALATION_SOLUTION | Freq: Four times a day (QID) | RESPIRATORY_TRACT | Status: DC | PRN

## 2018-04-24 NOTE — H&P (View-Only) (Signed)
CARDIOLOGY CONSULT NOTE  Patient ID: Kerry Barnes MRN: [id number] DOB/AGE: 1934/03/05 84 y.o.  Admit date: 04/23/2018 Referring Physician  Gilles Chiquito, MD Primary Physician:  Aletha Halim., PA-C Reason for Consultation  Abnormal EKG  HPI: Kerry Barnes  is a 84 y.o. female  With history of atypical atrial flutter, has had direct-current cardioversion in August 2017, and again in November 2017, who was admitted to the hospital with abdominal and epigastric discomfort, on admission to the hospital, she also had complained of chest pain which was not nitrate responsive.   She also felt palpitations and states that all the symptoms started last Wednesday and this was similar when she had prior A. Fib. Today she is feeling fine but still feels palpitations.   Due to abnormal EKG with new ST depression in the lateral leads, I was consulted.  I reviewed the EKG yesterday, she had atypical atrial flutter with rate related ST-T wave changes.  I do not suspect myocardial ischemia or subendocardial infarction.  We had switched her to IV heparin thinking she may need cardiac catheterization.  She has been on chronically on Eliquis.  She has not had any bleeding diathesis.  Heart last name is different in our practice and hence I initially did not recognize her.  She is presently on IV diltiazem and amiodarone dose was increased to 200 mg p.o. twice daily from 200 mg once a day.  Past Medical History:  Diagnosis Date  . Anxiety   . Arthritis   . Asthma   . Atypical atrial flutter ([place])   . Cataract   . Chronic diastolic CHF (congestive heart failure) ([place])   . Fibromyalgia   . GERD (gastroesophageal reflux disease)   . History of blood transfusion   . Hyperlipemia   . Hypertension   . IBS (irritable bowel syndrome)   . Seasonal allergies   . Stroke [facility]) 2013   TIA  . Tubular adenoma of colon   . Type 2 diabetes mellitus ([place])      Past Surgical History:  Procedure  Laterality Date  . ANKLE FRACTURE SURGERY    . CARDIOVERSION N/A 02/04/2016   Procedure: CARDIOVERSION;  Surgeon: Sueanne Margarita, MD;  Location: [facility] ENDOSCOPY;  Service: Cardiovascular;  Laterality: N/A;  . CARDIOVERSION N/A 05/01/2016   Procedure: CARDIOVERSION;  Surgeon: Fay Records, MD;  Location: [place];  Service: Cardiovascular;  Laterality: N/A;  . CHOLECYSTECTOMY    . TEE WITHOUT CARDIOVERSION N/A 02/04/2016   Procedure: TRANSESOPHAGEAL ECHOCARDIOGRAM (TEE);  Surgeon: Sueanne Margarita, MD;  Location: [facility] ENDOSCOPY;  Service: Cardiovascular;  Laterality: N/A;  . TUBAL LIGATION    . WRIST FRACTURE SURGERY       Family History  Problem Relation Age of Onset  . Diabetes Mother   . Diabetes Father   . Diabetes Sister   . Diabetes Brother   . Colon cancer Neg Hx   . Stomach cancer Neg Hx      Social History: Social History   Socioeconomic History  . Marital status: Married    Spouse name: Not on file  . Number of children: 8  . Years of education: Not on file  . Highest education level: Not on file  Occupational History  . Occupation: Arboriculturist: RETIRED  Social Needs  . Financial resource strain: Not on file  . Food insecurity:    Worry: Not on file    Inability: Not on file  . Transportation needs:  Medical: Not on file    Non-medical: Not on file  Tobacco Use  . Smoking status: Never Smoker  . Smokeless tobacco: Never Used  Substance and Sexual Activity  . Alcohol use: No    Alcohol/week: 0.0 standard drinks  . Drug use: No  . Sexual activity: Never    Partners: Male  Lifestyle  . Physical activity:    Days per week: Not on file    Minutes per session: Not on file  . Stress: Not on file  Relationships  . Social connections:    Talks on phone: Not on file    Gets together: Not on file    Attends religious service: Not on file    Active member of club or organization: Not on file    Attends meetings of clubs or organizations: Not on file     Relationship status: Not on file  . Intimate partner violence:    Fear of current or ex partner: Not on file    Emotionally abused: Not on file    Physically abused: Not on file    Forced sexual activity: Not on file  Other Topics Concern  . Not on file  Social History Narrative   Grandchildren [address] Grandchildren 13     Medications Prior to Admission  Medication Sig Dispense Refill Last Dose  . acetaminophen (TYLENOL) 325 MG tablet Take 2 tablets (650 mg total) by mouth every 4 (four) hours as needed for headache or mild pain.   04/21/2018  . albuterol (PROVENTIL HFA;VENTOLIN HFA) 108 (90 BASE) MCG/ACT inhaler Inhale into the lungs every 6 (six) hours as needed for wheezing or shortness of breath.   04/22/2018 at Unknown time  . ALPRAZolam (XANAX) 0.5 MG tablet Take 0.5 mg by mouth 2 (two) times daily as needed for anxiety.   04/22/2018 at Unknown time  . amiodarone (PACERONE) 200 MG tablet Take 1 tablet (200 mg total) by mouth daily. 90 tablet 0 04/22/2018 at am  . apixaban (ELIQUIS) 2.5 MG TABS tablet Take 1 tablet by mouth twice daily, need to schedule MD appt for further refills 60 tablet 0 04/22/2018 at 2100  . atorvastatin (LIPITOR) 40 MG tablet Take 40 mg by mouth daily.   04/22/2018 at Unknown time  . cetirizine (ZYRTEC) 10 MG tablet Take 10 mg by mouth daily as needed for allergies.    04/21/2018  . diltiazem (CARDIZEM SR) 60 MG 12 hr capsule Take 60 mg daily by mouth.   04/22/2018 at Unknown time  . doxycycline (VIBRAMYCIN) 100 MG capsule Take 100 mg by mouth 2 (two) times daily. Started on 04-18-18  DS 10   04/22/2018 at Unknown time  . EPINEPHrine (EPI-PEN) 0.3 mg/0.3 mL SOAJ injection Inject 0.3 mg once as needed into the muscle (severe allergic reaction).    unk at prn  . Fluticasone-Salmeterol (ADVAIR) 100-50 MCG/DOSE AEPB Inhale 1 puff into the lungs every 12 (twelve) hours.   04/22/2018 at Unknown time  . ibandronate (BONIVA) 150 MG tablet Take 150 mg by mouth every 30  (thirty) days.   04/15/2018  . lubiprostone (AMITIZA) 24 MCG capsule Take 1 capsule (24 mcg total) by mouth 2 (two) times daily with a meal. 60 capsule 3 04/22/2018 at Unknown time  . meclizine (ANTIVERT) 25 MG tablet Take 25 mg by mouth 3 (three) times daily as needed for dizziness.    unk at prn  . methocarbamol (ROBAXIN) 500 MG tablet Take 1 tablet (500  mg total) every 6 (six) hours as needed by mouth for muscle spasms. 15 tablet 0 04/22/2018 at Unknown time  . mometasone (NASONEX) 50 MCG/ACT nasal spray Place 2 sprays into the nose daily as needed (nasal congestion).    unk at prn  . montelukast (SINGULAIR) 10 MG tablet Take 10 mg at bedtime by mouth.    04/22/2018 at Unknown time  . olopatadine (PATANOL) 0.1 % ophthalmic solution Place 1 drop into both eyes 2 (two) times daily.    04/22/2018 at Unknown time  . pregabalin (LYRICA) 75 MG capsule Take 75 mg by mouth 2 (two) times daily.   04/22/2018 at Unknown time  . Propylene Glycol (SYSTANE BALANCE OP) Place 1 drop daily as needed into both eyes (dryness).    Past Week at Unknown time  . ranitidine (ZANTAC) 150 MG capsule TAKE 1 CAPSULE(150 MG) BY MOUTH EVERY EVENING (Patient taking differently: Take 150 mg by mouth daily. ) 90 capsule 1 04/22/2018 at Unknown time  . solifenacin (VESICARE) 5 MG tablet Take 5 mg by mouth daily.   04/22/2018 at Unknown time  . traMADol (ULTRAM) 50 MG tablet Take 50 mg every 12 (twelve) hours as needed by mouth (pain).    unk at prn  . pantoprazole (PROTONIX) 40 MG tablet Take 1 tablet (40 mg total) by mouth 2 (two) times daily. Take before breakfast and dinner (Patient not taking: Reported on 04/23/2018) 60 tablet 5 Not Taking at Unknown time   Review of Systems  Constitutional: Negative.   HENT: Negative.   Eyes: Negative.   Respiratory: Negative.   Cardiovascular: Positive for chest pain and palpitations.  Gastrointestinal: Positive for nausea and vomiting.  Genitourinary: Negative.   Musculoskeletal: Negative.    Skin: Negative.   Neurological: Negative.   Psychiatric/Behavioral: Negative.   All other systems reviewed and are negative.  Physical Exam: Blood pressure (!) 110/57, pulse (!) 112, temperature 98 F (36.7 C), temperature source Oral, resp. rate 10, height 5\' 6"  (1.676 m), weight 69.2 kg, SpO2 98 %.  Physical Exam  Constitutional: She is oriented to person, place, and time. She appears well-developed and well-nourished. No distress.  HENT:  Head: Atraumatic.  Eyes: Conjunctivae are normal.  Neck: Neck supple. No JVD present. No tracheal deviation present. No thyromegaly present.  Cardiovascular: Intact distal pulses. An irregular rhythm present. Tachycardia present. Exam reveals distant heart sounds.  No murmur heard. Pulses:      Carotid pulses are 3+ on the right side, and 3+ on the left side. Pulmonary/Chest: Effort normal and breath sounds normal.  Abdominal: Soft. Bowel sounds are normal.  Musculoskeletal: Normal range of motion. She exhibits no edema.  Neurological: She is alert and oriented to person, place, and time.  Skin: Skin is warm.  Psychiatric: She has a normal mood and affect.    Labs:   Lab Results  Component Value Date   WBC 7.0 04/23/2018   HGB 14.7 04/23/2018   HCT 46.9 (H) 04/23/2018   MCV 85.1 04/23/2018   PLT 316 04/23/2018    Recent Labs  Lab 04/23/18 0629  NA 138  K 3.5  CL 105  CO2 21*  BUN 17  CREATININE 1.67*  CALCIUM 9.6  PROT 7.6  BILITOT 1.2  ALKPHOS 88  ALT 16  AST 32  GLUCOSE 127*    Lipid Panel     Component Value Date/Time   CHOL (H) 09/09/2010 0110    206        ATP III  CLASSIFICATION:  <200     mg/dL   Desirable  200-239  mg/dL   Borderline High  >=240    mg/dL   High          TRIG 129 09/09/2010 0110   HDL 51 09/09/2010 0110   CHOLHDL 4.0 09/09/2010 0110   VLDL 26 09/09/2010 0110   LDLCALC (H) 09/09/2010 0110    129        Total Cholesterol/HDL:CHD Risk Coronary Heart Disease Risk Table                      Men   Women  1/2 Average Risk   3.4   3.3  Average Risk       5.0   4.4  2 X Average Risk   9.6   7.1  3 X Average Risk  23.4   11.0        Use the calculated Patient Ratio above and the CHD Risk Table to determine the patient's CHD Risk.        ATP III CLASSIFICATION (LDL):  <100     mg/dL   Optimal  100-129  mg/dL   Near or Above                    Optimal  130-159  mg/dL   Borderline  160-189  mg/dL   High  >190     mg/dL   Very High     BNP (last 3 results) Recent Labs    04/23/18 0634  BNP 285.0*    HEMOGLOBIN A1C Lab Results  Component Value Date   HGBA1C (H) 09/08/2010    6.3 (NOTE)                                                                       According to the ADA Clinical Practice Recommendations for 2011, when HbA1c is used as a screening test:   >=6.5%   Diagnostic of Diabetes Mellitus           (if abnormal result  is confirmed)  5.7-6.4%   Increased risk of developing Diabetes Mellitus  References:Diagnosis and Classification of Diabetes Mellitus,Diabetes AYTK,1601,09(NATFT 1):S62-S69 and Standards of Medical Care in         Diabetes - 2011,Diabetes Care,2011,34  (Suppl 1):S11-S61.   MPG 134 (H) 09/08/2010    Cardiac Panel (last 3 results) Recent Labs    04/23/18 1521 04/24/18 0244 04/24/18 1115  TROPONINI 0.05* 0.06* 0.04*    Lab Results  Component Value Date   CKTOTAL 127 09/09/2010   CKMB 1.8 09/09/2010   TROPONINI 0.04 ([place]) 04/24/2018   Radiology: Dg Chest 2 View  Result Date: 04/23/2018 CLINICAL DATA:  Chest pressure and pain starting at 5 a.m. EXAM: CHEST - 2 VIEW COMPARISON:  Chest CT 04/23/2018 FINDINGS: Mild enlargement of the cardiopericardial silhouette without edema. Linear subsegmental atelectasis at the left lung base. Atherosclerotic calcification of the aortic arch. Mild dextroconvex lower thoracic scoliosis. No pleural effusion. IMPRESSION: 1. Mild enlargement of the cardiopericardial silhouette, without edema. 2. Mild left  basilar subsegmental atelectasis or scarring. 3. Dextroconvex lower thoracic scoliosis. 4.  Aortic Atherosclerosis (ICD10-I70.0). Electronically Signed   By: Cindra Eves.D.  On: 04/23/2018 07:48   Ct Angio Chest/abd/pel For Dissection W And/or Wo Contrast  Addendum Date: 04/23/2018   ADDENDUM REPORT: 04/23/2018 08:02 ADDENDUM: There is also 2.4 cm hypoattenuating left thyroid nodule, an incidental finding of potential clinical significance. Consider further evaluation with thyroid ultrasound. If patient is clinically hyperthyroid, consider nuclear medicine thyroid uptake and scan. Electronically Signed   By: Lajean Manes [name]   On: 04/23/2018 08:02   Result Date: 04/23/2018 CLINICAL DATA:  Chest pressure and pain beginning early this morning. Dyspnea on exertion. Some nausea and vomiting. EXAM: CT ANGIOGRAPHY CHEST, ABDOMEN AND PELVIS TECHNIQUE: Multidetector CT imaging through the chest, abdomen and pelvis was performed using the standard protocol during bolus administration of intravenous contrast. Multiplanar reconstructed images and MIPs were obtained and reviewed to evaluate the vascular anatomy. CONTRAST:  145mL ISOVUE-370 IOPAMIDOL (ISOVUE-370) INJECTION 76% COMPARISON:  Chest radiograph 01/31/2016. Abdomen and pelvis CT, 08/11/2017. FINDINGS: CTA CHEST FINDINGS Cardiovascular: Heart is mildly enlarged. No pericardial effusion. Mild left coronary artery calcifications. Great vessels normal in caliber. No aortic dissection. No atherosclerosis. Aortic arch branch vessels are widely patent. No evidence of a pulmonary embolism. Mediastinum/Nodes: 2.4 cm hypoattenuating left thyroid nodule. No neck base or axillary masses or enlarged lymph nodes. No mediastinal or hilar masses or adenopathy. Trachea and esophagus are unremarkable. Lungs/Pleura: Mild bilateral dependent reticular and hazy peribronchovascular opacities, most likely atelectasis. Study obtained with low lung volumes/expiration. No  convincing pneumonia or pulmonary edema. No lung mass or nodule. No pleural effusion or pneumothorax. Musculoskeletal: No fracture or acute finding. No osteoblastic or osteolytic lesions. Review of the MIP images confirms the above findings. CTA ABDOMEN AND PELVIS FINDINGS VASCULAR Aorta: Aorta is normal in caliber. No dissection. Mild atherosclerosis. No significant stenosis. Celiac: Mild plaque at the origin. No significant stenosis. No dissection, aneurysm or vasculitis. SMA: Mild plaque at the origin. No significant stenosis. No dissection, aneurysm or vasculitis. Renals: Single right and 2 left renal arteries. Mild plaque at the origin of the right renal artery without significant stenosis. No dissection, vasculitis, aneurysm or fibromuscular dysplasia. IMA: Minor plaque at the origin without significant stenosis. Inflow: Bilateral common iliac atherosclerotic plaque without significant stenosis. External iliac arteries are widely patent. Veins: No obvious venous abnormality within the limitations of this arterial phase study. Review of the MIP images confirms the above findings. NON-VASCULAR Hepatobiliary: Liver normal in overall size. There is central volume loss with some anterior surface nodularity, findings raising the possibility of cirrhosis. No liver mass or focal lesion. Gallbladder surgically absent. Common bile duct dilated to 1 cm proximally with normal distal tapering. This is presumed chronic. Pancreas: Unremarkable. No pancreatic ductal dilatation or surrounding inflammatory changes. Spleen: Normal in size without focal abnormality. Adrenals/Urinary Tract: No adrenal masses. Bilateral renal cortical thinning with reduced renal sizes, right 8.4 cm in length, left 8.8 cm in length. 8 mm low-attenuation lesion in the posterior midpole, left kidney, likely a cyst. No other renal masses or lesions, no stones and no hydronephrosis. Ureters are normal in course and in caliber. Bladder is unremarkable.  Stomach/Bowel: Stomach is within normal limits. Appendix appears normal. No evidence of bowel wall thickening, distention, or inflammatory changes. Lymphatic: No adenopathy. Reproductive: Uterus and bilateral adnexa are unremarkable. Other: No abdominal wall hernia or abnormality. No abdominopelvic ascites. Musculoskeletal: No fracture or acute finding. No osteoblastic or osteolytic lesions. There are degenerative changes noted throughout the thoracolumbar spine with an S-shaped scoliosis. Review of the MIP images confirms the above findings. IMPRESSION: CTA  FINDINGS 1. No aortic aneurysm or dissection.  No acute findings. 2. Atherosclerotic changes along the abdominal aorta. No significant stenosis. Branch vessels are widely patent. NON CTA FINDINGS 1. No acute findings in the chest, abdomen or pelvis. 2. Mild cardiomegaly.  Mild left coronary artery calcifications. 3. Lungs show mild dependent opacity that is most likely atelectasis. 4. Morphologic changes of the liver raise the possibility of cirrhosis. 5. Bilateral renal cortical thinning with read Tues renal sizes consistent with medical renal disease. Electronically Signed: By: Lajean Manes [name] On: 04/23/2018 07:52    Scheduled Meds: . amiodarone  200 mg Oral BID  . aspirin EC  325 mg Oral Daily  . atorvastatin  40 mg Oral Daily  . darifenacin  7.5 mg Oral Daily  . diltiazem  60 mg Oral Daily  . fluticasone  1 spray Each Nare Daily  . insulin aspart  0-5 Units Subcutaneous QHS  . insulin aspart  0-9 Units Subcutaneous TID WC  . loratadine  10 mg Oral Daily  . lubiprostone  24 mcg Oral BID WC  . mometasone-formoterol  2 puff Inhalation BID  . montelukast  10 mg Oral QHS  . olopatadine  1 drop Both Eyes BID  . pantoprazole  40 mg Oral BID  . pregabalin  75 mg Oral BID  . sucralfate  1 g Oral TID WC & HS   Continuous Infusions: . diltiazem (CARDIZEM) infusion Stopped (04/24/18 0216)  . heparin 650 Units/hr (04/24/18 1252)   PRN  Meds:.acetaminophen, ALPRAZolam, levalbuterol, meclizine, methocarbamol, morphine injection, ondansetron (ZOFRAN) IV, polyvinyl alcohol  CARDIAC STUDIES:  EKG 04/23/2018: Atypical atrial flutter with 2: 1 conduction with RVR, ventricular rate 110 bpm, borderline criteria for LVH, ST-T abnormality, consider anterolateral subendocardial ischemia versus infarct. EKG 04/24/2018: Atypical atrial flutter with variable AV conduction, controlled ventricular rate at 63 bpm, nonspecific T abnormality.  Borderline criteria for LVH.  Echocardiogram 02/01/2016: Normal LVEF, mild calcification of aortic annulus, moderate to severely enlarged left atrium, mild MR and mild pulmonary hypertension.  Lexiscan nuclear stress test 2015: Negative for ischemia.  ASSESSMENT AND PLAN:  Kerry Barnes, in our records is Kerry Barnes)  1.  Atypical atrial flutter with rapid ventricular response. CHA2DS2-VASCScore: Risk Score  7,  Yearly risk of stroke  9.6. Recommendation: ASA No/Anticoagulation Yes  2.  Hypertension 3.  History of TIA 4.  Diabetes mellitus type 2 controlled with stage III chronic kidney disease without hyperglycemia 5.  Essential hypertension  Recommendation: Patient's heart rate is now well controlled, presently on increased dose of amiodarone.  I will try to set her up for direct-current cardioversion if she does not convert to sinus rhythm by tomorrow.  Will restart Eliquis back.  She is willing to proceed with cardioversion and states that the symptoms of chest pain and palpitations are similar when she had A. Fib previously.  Blood pressure is well controlled.  Diabetes is being managed by insulin sliding scale.  Otherwise she remains stable.  I do not suspect ACS.  [name], MD 04/24/2018, 2:50 PM [facility]. [place] Pager: [phone] Office: [phone] If no answer Cell [phone]

## 2018-04-24 NOTE — Progress Notes (Signed)
Pt transferred from 3East to 4East Room 18. Pt received CHG bath, placed on tele, CCMD notified. VSS. Pt complains of chest pain, morphine given. Call bell within reach. Will continue to monitor.   Fransico Michael, RN

## 2018-04-24 NOTE — Progress Notes (Signed)
[facility] for heparin Indication: chest pain/ACS  Allergies  Allergen Reactions  . Codeine Nausea Only  . Metformin Diarrhea  . Amoxicillin Rash  . Biaxin [Clarithromycin] Rash  . Ciprofloxacin Rash  . Penicillins Rash    Childhood allergic reaction Has patient had a PCN reaction causing immediate rash, facial/tongue/throat swelling, SOB or lightheadedness with hypotension: Yes Has patient had a PCN reaction causing severe rash involving mucus membranes or skin necrosis: No Has patient had a PCN reaction that required hospitalization: Yes Has patient had a PCN reaction occurring within the last 10 years: No If all of the above answers are "NO", then may proceed with Cephalosporin use.    Patient Measurements: Height: 5\' 6"  (167.6 cm) Weight: 152 lb 8.9 oz (69.2 kg) IBW/kg (Calculated) : 59.3 Heparin Dosing Weight: 69 kg  Vital Signs: Temp: 99.3 F (37.4 C) (11/10 0400) Temp Source: Oral (11/10 0400) BP: 115/64 (11/10 0400) Pulse Rate: 56 (11/10 0400)  Labs: Recent Labs    04/23/18 0629 04/23/18 1242 04/23/18 1521 04/24/18 0244  HGB 14.7  --   --   --   HCT 46.9*  --   --   --   PLT 316  --   --   --   APTT  --   --   --  125*  HEPARINUNFRC  --   --   --  1.18*  CREATININE 1.67*  --   --   --   TROPONINI  --  0.06* 0.05* 0.06*    Estimated Creatinine Clearance: 24.3 mL/min (A) (by C-G formula based on SCr of 1.67 mg/dL (H)).  Assessment: 84 yo Female with EKG changes and h/o Afib, Eliquis on hold, for heparin  Goal of Therapy:  APTT 66-102 Heparin level 0.3-0.7 units/ml aPTT 66-102 seconds seconds Monitor platelets by anticoagulation protocol: Yes   Plan:  Decrease Heparin 750 units/hr Check heparin level in 8 hours.   Phillis Knack, PharmD, BCPS  04/24/2018

## 2018-04-24 NOTE — Progress Notes (Signed)
ANTICOAGULATION CONSULT NOTE - Initial Consult  Pharmacy Consult for heparin Indication: chest pain/ACS  Allergies  Allergen Reactions  . Codeine Nausea Only  . Metformin Diarrhea  . Amoxicillin Rash  . Biaxin [Clarithromycin] Rash  . Ciprofloxacin Rash  . Penicillins Rash    Childhood allergic reaction Has patient had a PCN reaction causing immediate rash, facial/tongue/throat swelling, SOB or lightheadedness with hypotension: Yes Has patient had a PCN reaction causing severe rash involving mucus membranes or skin necrosis: No Has patient had a PCN reaction that required hospitalization: Yes Has patient had a PCN reaction occurring within the last 10 years: No If all of the above answers are "NO", then may proceed with Cephalosporin use.    Patient Measurements: Height: 5\' 6"  (167.6 cm) Weight: 152 lb 8.9 oz (69.2 kg) IBW/kg (Calculated) : 59.3 Heparin Dosing Weight: 69 kg  Vital Signs: Temp: 98 F (36.7 C) (11/10 0800) Temp Source: Oral (11/10 0800) BP: 101/71 (11/10 0800) Pulse Rate: 110 (11/10 0905)  Labs: Recent Labs    04/23/18 0629 04/23/18 1242 04/23/18 1521 04/24/18 0244 04/24/18 1115  HGB 14.7  --   --   --   --   HCT 46.9*  --   --   --   --   PLT 316  --   --   --   --   APTT  --   --   --  125* 108*  HEPARINUNFRC  --   --   --  1.18*  --   CREATININE 1.67*  --   --   --   --   TROPONINI  --  0.06* 0.05* 0.06*  --     Estimated Creatinine Clearance: 24.3 mL/min (A) (by C-G formula based on SCr of 1.67 mg/dL (H)).   Medical History: Past Medical History:  Diagnosis Date  . Allergy   . Anxiety   . Arthritis   . Asthma   . Atrial fibrillation ([place])   . Atrial flutter ([place])   . Blood transfusion without reported diagnosis   . Cataract   . Chronic diastolic CHF (congestive heart failure) ([place])   . Diabetes mellitus   . Fibromyalgia   . GERD (gastroesophageal reflux disease)   . Hyperlipemia   . Hypertension   . IBS (irritable bowel  syndrome)   . Stroke [facility]) 2013   TIA  . Tubular adenoma of colon      Assessment: 37 yoF on Eliquis PTA for PAF now with EKG changes. Pt to start on IV heparin, last dose of apixaban was 11/8 prior to admission.  Repeat aPTT remains slightly above goal at 108 seconds.   Goal of Therapy:  Heparin level 0.3-0.7 units/ml aPTT 66-102 seconds seconds Monitor platelets by anticoagulation protocol: Yes   Plan:  -Reduce heparin to 650 units/hr -Recheck aPTT with morning labs   Arrie Senate, PharmD, BCPS Clinical Pharmacist [phone] Please check AMION for all [place] numbers 04/24/2018

## 2018-04-24 NOTE — Consult Note (Signed)
CARDIOLOGY CONSULT NOTE  Patient ID: Kerry Barnes MRN: [id number] DOB/AGE: 08/04/1933 84 y.o.  Admit date: 04/23/2018 Referring Physician  Gilles Chiquito, MD Primary Physician:  Aletha Halim., PA-C Reason for Consultation  Abnormal EKG  HPI: Kerry Barnes  is a 84 y.o. female  With history of atypical atrial flutter, has had direct-current cardioversion in August 2017, and again in November 2017, who was admitted to the hospital with abdominal and epigastric discomfort, on admission to the hospital, she also had complained of chest pain which was not nitrate responsive.   She also felt palpitations and states that all the symptoms started last Wednesday and this was similar when she had prior A. Fib. Today she is feeling fine but still feels palpitations.   Due to abnormal EKG with new ST depression in the lateral leads, I was consulted.  I reviewed the EKG yesterday, she had atypical atrial flutter with rate related ST-T wave changes.  I do not suspect myocardial ischemia or subendocardial infarction.  We had switched her to IV heparin thinking she may need cardiac catheterization.  She has been on chronically on Eliquis.  She has not had any bleeding diathesis.  Heart last name is different in our practice and hence I initially did not recognize her.  She is presently on IV diltiazem and amiodarone dose was increased to 200 mg p.o. twice daily from 200 mg once a day.  Past Medical History:  Diagnosis Date  . Anxiety   . Arthritis   . Asthma   . Atypical atrial flutter ([place])   . Cataract   . Chronic diastolic CHF (congestive heart failure) ([place])   . Fibromyalgia   . GERD (gastroesophageal reflux disease)   . History of blood transfusion   . Hyperlipemia   . Hypertension   . IBS (irritable bowel syndrome)   . Seasonal allergies   . Stroke [facility]) 2013   TIA  . Tubular adenoma of colon   . Type 2 diabetes mellitus ([place])      Past Surgical History:  Procedure  Laterality Date  . ANKLE FRACTURE SURGERY    . CARDIOVERSION N/A 02/04/2016   Procedure: CARDIOVERSION;  Surgeon: Sueanne Margarita, MD;  Location: [facility] ENDOSCOPY;  Service: Cardiovascular;  Laterality: N/A;  . CARDIOVERSION N/A 05/01/2016   Procedure: CARDIOVERSION;  Surgeon: Fay Records, MD;  Location: [place];  Service: Cardiovascular;  Laterality: N/A;  . CHOLECYSTECTOMY    . TEE WITHOUT CARDIOVERSION N/A 02/04/2016   Procedure: TRANSESOPHAGEAL ECHOCARDIOGRAM (TEE);  Surgeon: Sueanne Margarita, MD;  Location: [facility] ENDOSCOPY;  Service: Cardiovascular;  Laterality: N/A;  . TUBAL LIGATION    . WRIST FRACTURE SURGERY       Family History  Problem Relation Age of Onset  . Diabetes Mother   . Diabetes Father   . Diabetes Sister   . Diabetes Brother   . Colon cancer Neg Hx   . Stomach cancer Neg Hx      Social History: Social History   Socioeconomic History  . Marital status: Married    Spouse name: Not on file  . Number of children: 8  . Years of education: Not on file  . Highest education level: Not on file  Occupational History  . Occupation: Arboriculturist: RETIRED  Social Needs  . Financial resource strain: Not on file  . Food insecurity:    Worry: Not on file    Inability: Not on file  . Transportation needs:  Medical: Not on file    Non-medical: Not on file  Tobacco Use  . Smoking status: Never Smoker  . Smokeless tobacco: Never Used  Substance and Sexual Activity  . Alcohol use: No    Alcohol/week: 0.0 standard drinks  . Drug use: No  . Sexual activity: Never    Partners: Male  Lifestyle  . Physical activity:    Days per week: Not on file    Minutes per session: Not on file  . Stress: Not on file  Relationships  . Social connections:    Talks on phone: Not on file    Gets together: Not on file    Attends religious service: Not on file    Active member of club or organization: Not on file    Attends meetings of clubs or organizations: Not on file     Relationship status: Not on file  . Intimate partner violence:    Fear of current or ex partner: Not on file    Emotionally abused: Not on file    Physically abused: Not on file    Forced sexual activity: Not on file  Other Topics Concern  . Not on file  Social History Narrative   Grandchildren [address] Grandchildren 13     Medications Prior to Admission  Medication Sig Dispense Refill Last Dose  . acetaminophen (TYLENOL) 325 MG tablet Take 2 tablets (650 mg total) by mouth every 4 (four) hours as needed for headache or mild pain.   04/21/2018  . albuterol (PROVENTIL HFA;VENTOLIN HFA) 108 (90 BASE) MCG/ACT inhaler Inhale into the lungs every 6 (six) hours as needed for wheezing or shortness of breath.   04/22/2018 at Unknown time  . ALPRAZolam (XANAX) 0.5 MG tablet Take 0.5 mg by mouth 2 (two) times daily as needed for anxiety.   04/22/2018 at Unknown time  . amiodarone (PACERONE) 200 MG tablet Take 1 tablet (200 mg total) by mouth daily. 90 tablet 0 04/22/2018 at am  . apixaban (ELIQUIS) 2.5 MG TABS tablet Take 1 tablet by mouth twice daily, need to schedule MD appt for further refills 60 tablet 0 04/22/2018 at 2100  . atorvastatin (LIPITOR) 40 MG tablet Take 40 mg by mouth daily.   04/22/2018 at Unknown time  . cetirizine (ZYRTEC) 10 MG tablet Take 10 mg by mouth daily as needed for allergies.    04/21/2018  . diltiazem (CARDIZEM SR) 60 MG 12 hr capsule Take 60 mg daily by mouth.   04/22/2018 at Unknown time  . doxycycline (VIBRAMYCIN) 100 MG capsule Take 100 mg by mouth 2 (two) times daily. Started on 04-18-18  DS 10   04/22/2018 at Unknown time  . EPINEPHrine (EPI-PEN) 0.3 mg/0.3 mL SOAJ injection Inject 0.3 mg once as needed into the muscle (severe allergic reaction).    unk at prn  . Fluticasone-Salmeterol (ADVAIR) 100-50 MCG/DOSE AEPB Inhale 1 puff into the lungs every 12 (twelve) hours.   04/22/2018 at Unknown time  . ibandronate (BONIVA) 150 MG tablet Take 150 mg by mouth every 30  (thirty) days.   04/15/2018  . lubiprostone (AMITIZA) 24 MCG capsule Take 1 capsule (24 mcg total) by mouth 2 (two) times daily with a meal. 60 capsule 3 04/22/2018 at Unknown time  . meclizine (ANTIVERT) 25 MG tablet Take 25 mg by mouth 3 (three) times daily as needed for dizziness.    unk at prn  . methocarbamol (ROBAXIN) 500 MG tablet Take 1 tablet (500  mg total) every 6 (six) hours as needed by mouth for muscle spasms. 15 tablet 0 04/22/2018 at Unknown time  . mometasone (NASONEX) 50 MCG/ACT nasal spray Place 2 sprays into the nose daily as needed (nasal congestion).    unk at prn  . montelukast (SINGULAIR) 10 MG tablet Take 10 mg at bedtime by mouth.    04/22/2018 at Unknown time  . olopatadine (PATANOL) 0.1 % ophthalmic solution Place 1 drop into both eyes 2 (two) times daily.    04/22/2018 at Unknown time  . pregabalin (LYRICA) 75 MG capsule Take 75 mg by mouth 2 (two) times daily.   04/22/2018 at Unknown time  . Propylene Glycol (SYSTANE BALANCE OP) Place 1 drop daily as needed into both eyes (dryness).    Past Week at Unknown time  . ranitidine (ZANTAC) 150 MG capsule TAKE 1 CAPSULE(150 MG) BY MOUTH EVERY EVENING (Patient taking differently: Take 150 mg by mouth daily. ) 90 capsule 1 04/22/2018 at Unknown time  . solifenacin (VESICARE) 5 MG tablet Take 5 mg by mouth daily.   04/22/2018 at Unknown time  . traMADol (ULTRAM) 50 MG tablet Take 50 mg every 12 (twelve) hours as needed by mouth (pain).    unk at prn  . pantoprazole (PROTONIX) 40 MG tablet Take 1 tablet (40 mg total) by mouth 2 (two) times daily. Take before breakfast and dinner (Patient not taking: Reported on 04/23/2018) 60 tablet 5 Not Taking at Unknown time   Review of Systems  Constitutional: Negative.   HENT: Negative.   Eyes: Negative.   Respiratory: Negative.   Cardiovascular: Positive for chest pain and palpitations.  Gastrointestinal: Positive for nausea and vomiting.  Genitourinary: Negative.   Musculoskeletal: Negative.    Skin: Negative.   Neurological: Negative.   Psychiatric/Behavioral: Negative.   All other systems reviewed and are negative.  Physical Exam: Blood pressure (!) 110/57, pulse (!) 112, temperature 98 F (36.7 C), temperature source Oral, resp. rate 10, height 5\' 6"  (1.676 m), weight 69.2 kg, SpO2 98 %.  Physical Exam  Constitutional: She is oriented to person, place, and time. She appears well-developed and well-nourished. No distress.  HENT:  Head: Atraumatic.  Eyes: Conjunctivae are normal.  Neck: Neck supple. No JVD present. No tracheal deviation present. No thyromegaly present.  Cardiovascular: Intact distal pulses. An irregular rhythm present. Tachycardia present. Exam reveals distant heart sounds.  No murmur heard. Pulses:      Carotid pulses are 3+ on the right side, and 3+ on the left side. Pulmonary/Chest: Effort normal and breath sounds normal.  Abdominal: Soft. Bowel sounds are normal.  Musculoskeletal: Normal range of motion. She exhibits no edema.  Neurological: She is alert and oriented to person, place, and time.  Skin: Skin is warm.  Psychiatric: She has a normal mood and affect.    Labs:   Lab Results  Component Value Date   WBC 7.0 04/23/2018   HGB 14.7 04/23/2018   HCT 46.9 (H) 04/23/2018   MCV 85.1 04/23/2018   PLT 316 04/23/2018    Recent Labs  Lab 04/23/18 0629  NA 138  K 3.5  CL 105  CO2 21*  BUN 17  CREATININE 1.67*  CALCIUM 9.6  PROT 7.6  BILITOT 1.2  ALKPHOS 88  ALT 16  AST 32  GLUCOSE 127*    Lipid Panel     Component Value Date/Time   CHOL (H) 09/09/2010 0110    206        ATP III  CLASSIFICATION:  <200     mg/dL   Desirable  200-239  mg/dL   Borderline High  >=240    mg/dL   High          TRIG 129 09/09/2010 0110   HDL 51 09/09/2010 0110   CHOLHDL 4.0 09/09/2010 0110   VLDL 26 09/09/2010 0110   LDLCALC (H) 09/09/2010 0110    129        Total Cholesterol/HDL:CHD Risk Coronary Heart Disease Risk Table                      Men   Women  1/2 Average Risk   3.4   3.3  Average Risk       5.0   4.4  2 X Average Risk   9.6   7.1  3 X Average Risk  23.4   11.0        Use the calculated Patient Ratio above and the CHD Risk Table to determine the patient's CHD Risk.        ATP III CLASSIFICATION (LDL):  <100     mg/dL   Optimal  100-129  mg/dL   Near or Above                    Optimal  130-159  mg/dL   Borderline  160-189  mg/dL   High  >190     mg/dL   Very High     BNP (last 3 results) Recent Labs    04/23/18 0634  BNP 285.0*    HEMOGLOBIN A1C Lab Results  Component Value Date   HGBA1C (H) 09/08/2010    6.3 (NOTE)                                                                       According to the ADA Clinical Practice Recommendations for 2011, when HbA1c is used as a screening test:   >=6.5%   Diagnostic of Diabetes Mellitus           (if abnormal result  is confirmed)  5.7-6.4%   Increased risk of developing Diabetes Mellitus  References:Diagnosis and Classification of Diabetes Mellitus,Diabetes UJWJ,1914,78(GNFAO 1):S62-S69 and Standards of Medical Care in         Diabetes - 2011,Diabetes Care,2011,34  (Suppl 1):S11-S61.   MPG 134 (H) 09/08/2010    Cardiac Panel (last 3 results) Recent Labs    04/23/18 1521 04/24/18 0244 04/24/18 1115  TROPONINI 0.05* 0.06* 0.04*    Lab Results  Component Value Date   CKTOTAL 127 09/09/2010   CKMB 1.8 09/09/2010   TROPONINI 0.04 ([place]) 04/24/2018   Radiology: Dg Chest 2 View  Result Date: 04/23/2018 CLINICAL DATA:  Chest pressure and pain starting at 5 a.m. EXAM: CHEST - 2 VIEW COMPARISON:  Chest CT 04/23/2018 FINDINGS: Mild enlargement of the cardiopericardial silhouette without edema. Linear subsegmental atelectasis at the left lung base. Atherosclerotic calcification of the aortic arch. Mild dextroconvex lower thoracic scoliosis. No pleural effusion. IMPRESSION: 1. Mild enlargement of the cardiopericardial silhouette, without edema. 2. Mild left  basilar subsegmental atelectasis or scarring. 3. Dextroconvex lower thoracic scoliosis. 4.  Aortic Atherosclerosis (ICD10-I70.0). Electronically Signed   By: Cindra Eves.D.  On: 04/23/2018 07:48   Ct Angio Chest/abd/pel For Dissection W And/or Wo Contrast  Addendum Date: 04/23/2018   ADDENDUM REPORT: 04/23/2018 08:02 ADDENDUM: There is also 2.4 cm hypoattenuating left thyroid nodule, an incidental finding of potential clinical significance. Consider further evaluation with thyroid ultrasound. If patient is clinically hyperthyroid, consider nuclear medicine thyroid uptake and scan. Electronically Signed   By: Lajean Manes [name]   On: 04/23/2018 08:02   Result Date: 04/23/2018 CLINICAL DATA:  Chest pressure and pain beginning early this morning. Dyspnea on exertion. Some nausea and vomiting. EXAM: CT ANGIOGRAPHY CHEST, ABDOMEN AND PELVIS TECHNIQUE: Multidetector CT imaging through the chest, abdomen and pelvis was performed using the standard protocol during bolus administration of intravenous contrast. Multiplanar reconstructed images and MIPs were obtained and reviewed to evaluate the vascular anatomy. CONTRAST:  133mL ISOVUE-370 IOPAMIDOL (ISOVUE-370) INJECTION 76% COMPARISON:  Chest radiograph 01/31/2016. Abdomen and pelvis CT, 08/11/2017. FINDINGS: CTA CHEST FINDINGS Cardiovascular: Heart is mildly enlarged. No pericardial effusion. Mild left coronary artery calcifications. Great vessels normal in caliber. No aortic dissection. No atherosclerosis. Aortic arch branch vessels are widely patent. No evidence of a pulmonary embolism. Mediastinum/Nodes: 2.4 cm hypoattenuating left thyroid nodule. No neck base or axillary masses or enlarged lymph nodes. No mediastinal or hilar masses or adenopathy. Trachea and esophagus are unremarkable. Lungs/Pleura: Mild bilateral dependent reticular and hazy peribronchovascular opacities, most likely atelectasis. Study obtained with low lung volumes/expiration. No  convincing pneumonia or pulmonary edema. No lung mass or nodule. No pleural effusion or pneumothorax. Musculoskeletal: No fracture or acute finding. No osteoblastic or osteolytic lesions. Review of the MIP images confirms the above findings. CTA ABDOMEN AND PELVIS FINDINGS VASCULAR Aorta: Aorta is normal in caliber. No dissection. Mild atherosclerosis. No significant stenosis. Celiac: Mild plaque at the origin. No significant stenosis. No dissection, aneurysm or vasculitis. SMA: Mild plaque at the origin. No significant stenosis. No dissection, aneurysm or vasculitis. Renals: Single right and 2 left renal arteries. Mild plaque at the origin of the right renal artery without significant stenosis. No dissection, vasculitis, aneurysm or fibromuscular dysplasia. IMA: Minor plaque at the origin without significant stenosis. Inflow: Bilateral common iliac atherosclerotic plaque without significant stenosis. External iliac arteries are widely patent. Veins: No obvious venous abnormality within the limitations of this arterial phase study. Review of the MIP images confirms the above findings. NON-VASCULAR Hepatobiliary: Liver normal in overall size. There is central volume loss with some anterior surface nodularity, findings raising the possibility of cirrhosis. No liver mass or focal lesion. Gallbladder surgically absent. Common bile duct dilated to 1 cm proximally with normal distal tapering. This is presumed chronic. Pancreas: Unremarkable. No pancreatic ductal dilatation or surrounding inflammatory changes. Spleen: Normal in size without focal abnormality. Adrenals/Urinary Tract: No adrenal masses. Bilateral renal cortical thinning with reduced renal sizes, right 8.4 cm in length, left 8.8 cm in length. 8 mm low-attenuation lesion in the posterior midpole, left kidney, likely a cyst. No other renal masses or lesions, no stones and no hydronephrosis. Ureters are normal in course and in caliber. Bladder is unremarkable.  Stomach/Bowel: Stomach is within normal limits. Appendix appears normal. No evidence of bowel wall thickening, distention, or inflammatory changes. Lymphatic: No adenopathy. Reproductive: Uterus and bilateral adnexa are unremarkable. Other: No abdominal wall hernia or abnormality. No abdominopelvic ascites. Musculoskeletal: No fracture or acute finding. No osteoblastic or osteolytic lesions. There are degenerative changes noted throughout the thoracolumbar spine with an S-shaped scoliosis. Review of the MIP images confirms the above findings. IMPRESSION: CTA  FINDINGS 1. No aortic aneurysm or dissection.  No acute findings. 2. Atherosclerotic changes along the abdominal aorta. No significant stenosis. Branch vessels are widely patent. NON CTA FINDINGS 1. No acute findings in the chest, abdomen or pelvis. 2. Mild cardiomegaly.  Mild left coronary artery calcifications. 3. Lungs show mild dependent opacity that is most likely atelectasis. 4. Morphologic changes of the liver raise the possibility of cirrhosis. 5. Bilateral renal cortical thinning with read Tues renal sizes consistent with medical renal disease. Electronically Signed: By: Lajean Manes [name] On: 04/23/2018 07:52    Scheduled Meds: . amiodarone  200 mg Oral BID  . aspirin EC  325 mg Oral Daily  . atorvastatin  40 mg Oral Daily  . darifenacin  7.5 mg Oral Daily  . diltiazem  60 mg Oral Daily  . fluticasone  1 spray Each Nare Daily  . insulin aspart  0-5 Units Subcutaneous QHS  . insulin aspart  0-9 Units Subcutaneous TID WC  . loratadine  10 mg Oral Daily  . lubiprostone  24 mcg Oral BID WC  . mometasone-formoterol  2 puff Inhalation BID  . montelukast  10 mg Oral QHS  . olopatadine  1 drop Both Eyes BID  . pantoprazole  40 mg Oral BID  . pregabalin  75 mg Oral BID  . sucralfate  1 g Oral TID WC & HS   Continuous Infusions: . diltiazem (CARDIZEM) infusion Stopped (04/24/18 0216)  . heparin 650 Units/hr (04/24/18 1252)   PRN  Meds:.acetaminophen, ALPRAZolam, levalbuterol, meclizine, methocarbamol, morphine injection, ondansetron (ZOFRAN) IV, polyvinyl alcohol  CARDIAC STUDIES:  EKG 04/23/2018: Atypical atrial flutter with 2: 1 conduction with RVR, ventricular rate 110 bpm, borderline criteria for LVH, ST-T abnormality, consider anterolateral subendocardial ischemia versus infarct. EKG 04/24/2018: Atypical atrial flutter with variable AV conduction, controlled ventricular rate at 63 bpm, nonspecific T abnormality.  Borderline criteria for LVH.  Echocardiogram 02/01/2016: Normal LVEF, mild calcification of aortic annulus, moderate to severely enlarged left atrium, mild MR and mild pulmonary hypertension.  Lexiscan nuclear stress test 2015: Negative for ischemia.  ASSESSMENT AND PLAN:  Mrs. Konni Kesinger, in our records is [place])  1.  Atypical atrial flutter with rapid ventricular response. CHA2DS2-VASCScore: Risk Score  7,  Yearly risk of stroke  9.6. Recommendation: ASA No/Anticoagulation Yes  2.  Hypertension 3.  History of TIA 4.  Diabetes mellitus type 2 controlled with stage III chronic kidney disease without hyperglycemia 5.  Essential hypertension  Recommendation: Patient's heart rate is now well controlled, presently on increased dose of amiodarone.  I will try to set her up for direct-current cardioversion if she does not convert to sinus rhythm by tomorrow.  Will restart Eliquis back.  She is willing to proceed with cardioversion and states that the symptoms of chest pain and palpitations are similar when she had A. Fib previously.  Blood pressure is well controlled.  Diabetes is being managed by insulin sliding scale.  Otherwise she remains stable.  I do not suspect ACS.  [name], MD 04/24/2018, 2:50 PM [facility] [place] Pager: [phone] Office: [phone] If no answer Cell [phone]

## 2018-04-24 NOTE — Progress Notes (Signed)
CRITICAL VALUE ALERT  Critical Value: Lactic Acid 2.3  Date & Time Notied:  11/10 1215  Provider Notified: [place]  Orders Received/Actions taken: Repeat Lactic Acid in 4hrs. Change CT Abd to STAT

## 2018-04-24 NOTE — Progress Notes (Signed)
PROGRESS NOTE    Kerry Barnes  GBT:[id number] DOB: September 03, 1933 DOA: 04/23/2018 PCP: Aletha Halim., PA-C  Outpatient Specialists:   Brief Narrative:  Kerry Barnes is a 84 y.o. female with medical history significant of TIA, IBS, HTN, HLD, GERD, FM, DM2, chronic diastolic CHF, Afib, Anxiety, arthritis, asthma and pulmonary hypertension.  Patient was admitted with producible lower sternal chest pain, epigastric pain and left lower abdominal quadrant pain.  Patient denied any use of NSAIDs.  Last EGD was a long time ago according to the patient, but patient could remember the exact year.  Awaiting a cardiology input.  Still on the heparin drip.  Patient is on amiodarone 200 mg p.o. once daily.  Will restart oral Cardizem, at the dose of 60 mg p.o. daily (this was patient's home dose).  Current heart rate is 112 bpm.  Will also start Protonix 40 mg p.o. twice daily, sucralfate, check lactic acid, and proceed with CAT scan of the abdomen and pelvis with oral contrast only.  Patient has chronic kidney disease.  Further management will depend on hospital course.     Assessment & Plan:   Active Problems:   Type 2 diabetes with stage 3 chronic kidney disease GFR 30-59 (HCC)   Hyperlipidemia   Essential hypertension   Chronic diastolic heart failure (HCC)   IBS   Chronic renal insufficiency, stage III (moderate) (HCC)   Asthma   Chest pressure   Abdominal pain   Chest pressure, nausea, vomiting - DDX includes GI pathology and cardiac pathology.  She has had nausea/vomiting for 1 day and inability to take her pills including reflux medication and others.  She is now tachycardic.  However, EKG is changed from previous.  Despite report of 8/10 chest pain, she is very comfortable on exam and able to ambulate easily.  - Repeat EKG, if changes persistent or worsened, will call cardiology - Trend Troponin - Telemetry - GI cocktail and nitroglycerin PRN - Morphine PRN for severe pain - IV  protonix X 1 - Restart amiodarone at previous dose - Will change medications to IV if unable to tolerate PO - Nausea control with zofran -04/24/2018: Negative troponins.  Atypical chest pain.  Await cardiology input.    Type 2 diabetes with stage 3 chronic kidney disease GFR 30-59 -Optimize.      Hyperlipidemia - Home atorvastatin    Essential hypertension - Monitor BP and pulse closely -Resume diltiazem (heart rate is currently at 112 bpm, in atrial flutter)    Chronic diastolic heart failure -Stable.   - CTA chest without pathology - Related to Afib from review of chart      Chronic renal insufficiency, stage III (moderate) - Renal function is stable, monitor closely     Asthma - Continue home zyrtec, advair, singulair  Arthritis - Continue home pregabalin and tramadol - Morphine for breakthrough pain  Anxiety - Continue home xanax  Atrial fibrillation/flutter - Restart home amiodarone and diltiazem, -Continue heparin drip. -Further management will depend on hospital course. - Work up chest pressure, pain as above.   Thyroid nodule - Found on CTA of chest/abdomen/pelvis - Follow up outpatient with TFTs and thyroid ultrasound.   DVT prophylaxis: Heparin drip Code Status: Full code Family Communication:  Disposition Plan: Home eventually  Consultants:   Cardiology  Procedures:   None  Antimicrobials:   None    Subjective: Reports epigastric pain, lower sternal pain that is easily reproducible and left lower abdominal quadrant pain.  Objective:  Vitals:   04/24/18 0400 04/24/18 0420 04/24/18 0800 04/24/18 0905  BP: 115/64  101/71   Pulse: (!) 56  (!) 114 (!) 110  Resp: (!) 23  15 18   Temp: 99.3 F (37.4 C)  98 F (36.7 C)   TempSrc: Oral  Oral   SpO2: 99%  96% 97%  Weight:  69.2 kg    Height:  5\' 6"  (1.676 m)      Intake/Output Summary (Last 24 hours) at 04/24/2018 0953 Last data filed at 04/23/2018 2200 Gross per 24 hour  Intake  2269.7 ml  Output -  Net 2269.7 ml   Filed Weights   04/23/18 0625 04/23/18 1217 04/24/18 0420  Weight: 70.8 kg 68.9 kg 69.2 kg    Examination:  General exam: Appears calm and comfortable  Respiratory system: Clear to auscultation. Respiratory effort normal. Cardiovascular system: S1 & S2, and atrial flutter at the rate of 112 bpm.  No pedal edema. Gastrointestinal system: Abdomen is soft with left lower abdominal quadrant tenderness.  Bowel sounds are present.  No organomegaly or masses felt.  Central nervous system: Alert and oriented. No focal neurological deficits. Extremities: Symmetric 5 x 5 power. Psychiatry: Judgement and insight appear normal. Mood & affect appropriate.   Data Reviewed: I have personally reviewed following labs and imaging studies  CBC: Recent Labs  Lab 04/23/18 0629  WBC 7.0  HGB 14.7  HCT 46.9*  MCV 85.1  PLT 742   Basic Metabolic Panel: Recent Labs  Lab 04/23/18 0629  NA 138  K 3.5  CL 105  CO2 21*  GLUCOSE 127*  BUN 17  CREATININE 1.67*  CALCIUM 9.6   GFR: Estimated Creatinine Clearance: 24.3 mL/min (A) (by C-G formula based on SCr of 1.67 mg/dL (H)). Liver Function Tests: Recent Labs  Lab 04/23/18 0629  AST 32  ALT 16  ALKPHOS 88  BILITOT 1.2  PROT 7.6  ALBUMIN 3.9   Recent Labs  Lab 04/23/18 0629  LIPASE 90*   No results for input(s): AMMONIA in the last 168 hours. Coagulation Profile: No results for input(s): INR, PROTIME in the last 168 hours. Cardiac Enzymes: Recent Labs  Lab 04/23/18 1242 04/23/18 1521 04/24/18 0244  TROPONINI 0.06* 0.05* 0.06*   BNP (last 3 results) No results for input(s): PROBNP in the last 8760 hours. HbA1C: No results for input(s): HGBA1C in the last 72 hours. CBG: Recent Labs  Lab 04/23/18 1619 04/23/18 2150 04/24/18 0914  GLUCAP 97 122* 146*   Lipid Profile: No results for input(s): CHOL, HDL, LDLCALC, TRIG, CHOLHDL, LDLDIRECT in the last 72 hours. Thyroid Function  Tests: No results for input(s): TSH, T4TOTAL, FREET4, T3FREE, THYROIDAB in the last 72 hours. Anemia Panel: No results for input(s): VITAMINB12, FOLATE, FERRITIN, TIBC, IRON, RETICCTPCT in the last 72 hours. Urine analysis:    Component Value Date/Time   COLORURINE YELLOW 04/26/2017 [place] 04/26/2017 1524   LABSPEC 1.010 04/26/2017 1524   PHURINE 6.0 04/26/2017 1524   GLUCOSEU NEGATIVE 04/26/2017 1524   HGBUR NEGATIVE 04/26/2017 [place] 04/26/2017 1524   KETONESUR NEGATIVE 04/26/2017 1524   PROTEINUR NEGATIVE 04/26/2017 1524   UROBILINOGEN 0.2 09/08/2010 1330   NITRITE NEGATIVE 04/26/2017 1524   LEUKOCYTESUR MODERATE (A) 04/26/2017 1524   Sepsis Labs: @LABRCNTIP (procalcitonin:4,lacticidven:4)  )No results found for this or any previous visit (from the past 240 hour(s)).       Radiology Studies: Dg Chest 2 View  Result Date: 04/23/2018 CLINICAL DATA:  Chest pressure and pain starting at 5 a.m. EXAM: CHEST - 2 VIEW COMPARISON:  Chest CT 04/23/2018 FINDINGS: Mild enlargement of the cardiopericardial silhouette without edema. Linear subsegmental atelectasis at the left lung base. Atherosclerotic calcification of the aortic arch. Mild dextroconvex lower thoracic scoliosis. No pleural effusion. IMPRESSION: 1. Mild enlargement of the cardiopericardial silhouette, without edema. 2. Mild left basilar subsegmental atelectasis or scarring. 3. Dextroconvex lower thoracic scoliosis. 4.  Aortic Atherosclerosis (ICD10-I70.0). Electronically Signed   By: Van Clines [name]   On: 04/23/2018 07:48   Ct Angio Chest/abd/pel For Dissection W And/or Wo Contrast  Addendum Date: 04/23/2018   ADDENDUM REPORT: 04/23/2018 08:02 ADDENDUM: There is also 2.4 cm hypoattenuating left thyroid nodule, an incidental finding of potential clinical significance. Consider further evaluation with thyroid ultrasound. If patient is clinically hyperthyroid, consider nuclear  medicine thyroid uptake and scan. Electronically Signed   By: Lajean Manes [name]   On: 04/23/2018 08:02   Result Date: 04/23/2018 CLINICAL DATA:  Chest pressure and pain beginning early this morning. Dyspnea on exertion. Some nausea and vomiting. EXAM: CT ANGIOGRAPHY CHEST, ABDOMEN AND PELVIS TECHNIQUE: Multidetector CT imaging through the chest, abdomen and pelvis was performed using the standard protocol during bolus administration of intravenous contrast. Multiplanar reconstructed images and MIPs were obtained and reviewed to evaluate the vascular anatomy. CONTRAST:  145mL ISOVUE-370 IOPAMIDOL (ISOVUE-370) INJECTION 76% COMPARISON:  Chest radiograph 01/31/2016. Abdomen and pelvis CT, 08/11/2017. FINDINGS: CTA CHEST FINDINGS Cardiovascular: Heart is mildly enlarged. No pericardial effusion. Mild left coronary artery calcifications. Great vessels normal in caliber. No aortic dissection. No atherosclerosis. Aortic arch branch vessels are widely patent. No evidence of a pulmonary embolism. Mediastinum/Nodes: 2.4 cm hypoattenuating left thyroid nodule. No neck base or axillary masses or enlarged lymph nodes. No mediastinal or hilar masses or adenopathy. Trachea and esophagus are unremarkable. Lungs/Pleura: Mild bilateral dependent reticular and hazy peribronchovascular opacities, most likely atelectasis. Study obtained with low lung volumes/expiration. No convincing pneumonia or pulmonary edema. No lung mass or nodule. No pleural effusion or pneumothorax. Musculoskeletal: No fracture or acute finding. No osteoblastic or osteolytic lesions. Review of the MIP images confirms the above findings. CTA ABDOMEN AND PELVIS FINDINGS VASCULAR Aorta: Aorta is normal in caliber. No dissection. Mild atherosclerosis. No significant stenosis. Celiac: Mild plaque at the origin. No significant stenosis. No dissection, aneurysm or vasculitis. SMA: Mild plaque at the origin. No significant stenosis. No dissection, aneurysm or  vasculitis. Renals: Single right and 2 left renal arteries. Mild plaque at the origin of the right renal artery without significant stenosis. No dissection, vasculitis, aneurysm or fibromuscular dysplasia. IMA: Minor plaque at the origin without significant stenosis. Inflow: Bilateral common iliac atherosclerotic plaque without significant stenosis. External iliac arteries are widely patent. Veins: No obvious venous abnormality within the limitations of this arterial phase study. Review of the MIP images confirms the above findings. NON-VASCULAR Hepatobiliary: Liver normal in overall size. There is central volume loss with some anterior surface nodularity, findings raising the possibility of cirrhosis. No liver mass or focal lesion. Gallbladder surgically absent. Common bile duct dilated to 1 cm proximally with normal distal tapering. This is presumed chronic. Pancreas: Unremarkable. No pancreatic ductal dilatation or surrounding inflammatory changes. Spleen: Normal in size without focal abnormality. Adrenals/Urinary Tract: No adrenal masses. Bilateral renal cortical thinning with reduced renal sizes, right 8.4 cm in length, left 8.8 cm in length. 8 mm low-attenuation lesion in the posterior midpole, left kidney, likely a cyst. No other renal masses or lesions, no stones  and no hydronephrosis. Ureters are normal in course and in caliber. Bladder is unremarkable. Stomach/Bowel: Stomach is within normal limits. Appendix appears normal. No evidence of bowel wall thickening, distention, or inflammatory changes. Lymphatic: No adenopathy. Reproductive: Uterus and bilateral adnexa are unremarkable. Other: No abdominal wall hernia or abnormality. No abdominopelvic ascites. Musculoskeletal: No fracture or acute finding. No osteoblastic or osteolytic lesions. There are degenerative changes noted throughout the thoracolumbar spine with an S-shaped scoliosis. Review of the MIP images confirms the above findings. IMPRESSION: CTA  FINDINGS 1. No aortic aneurysm or dissection.  No acute findings. 2. Atherosclerotic changes along the abdominal aorta. No significant stenosis. Branch vessels are widely patent. NON CTA FINDINGS 1. No acute findings in the chest, abdomen or pelvis. 2. Mild cardiomegaly.  Mild left coronary artery calcifications. 3. Lungs show mild dependent opacity that is most likely atelectasis. 4. Morphologic changes of the liver raise the possibility of cirrhosis. 5. Bilateral renal cortical thinning with read Tues renal sizes consistent with medical renal disease. Electronically Signed: By: Lajean Manes [name] On: 04/23/2018 07:52        Scheduled Meds: . amiodarone  200 mg Oral BID  . aspirin EC  325 mg Oral Daily  . atorvastatin  40 mg Oral Daily  . darifenacin  7.5 mg Oral Daily  . fluticasone  1 spray Each Nare Daily  . insulin aspart  0-5 Units Subcutaneous QHS  . insulin aspart  0-9 Units Subcutaneous TID WC  . loratadine  10 mg Oral Daily  . lubiprostone  24 mcg Oral BID WC  . mometasone-formoterol  2 puff Inhalation BID  . montelukast  10 mg Oral QHS  . olopatadine  1 drop Both Eyes BID  . pregabalin  75 mg Oral BID   Continuous Infusions: . diltiazem (CARDIZEM) infusion Stopped (04/24/18 0216)  . heparin 750 Units/hr (04/24/18 0528)     LOS: 0 days    Time spent: 35 minutes.   Dana Allan, MD  [facility] Pager #: [phone] 7PM-7AM contact night coverage as above

## 2018-04-24 NOTE — Care Management Obs Status (Signed)
[place] NOTIFICATION   Patient Details  Name: Kerry Barnes MRN: [id number] Date of Birth: 1934/04/30   Medicare Observation Status Notification Given:  Yes    Carles Collet, RN 04/24/2018, 10:12 AM

## 2018-04-24 NOTE — Progress Notes (Addendum)
I have called [facility] on call. Patient is stable. She has established relation with them.   Kerry Barnes

## 2018-04-24 NOTE — Progress Notes (Signed)
CRITICAL VALUE ALERT  Critical Value:  Troponin 0.06  Date & Time Notied:  04/24/18 0500  Provider Notified: Opyd MD  Orders Received/Actions taken: Continue to monitor

## 2018-04-24 NOTE — Progress Notes (Signed)
Pt drinking 1st bottle of contrast for CT of Abdomen.  Will continue to monitor.  Chauncey Reading

## 2018-04-25 ENCOUNTER — Inpatient Hospital Stay (HOSPITAL_COMMUNITY): Payer: PPO | Admitting: Anesthesiology

## 2018-04-25 ENCOUNTER — Encounter (HOSPITAL_COMMUNITY): Payer: Self-pay

## 2018-04-25 ENCOUNTER — Encounter (HOSPITAL_COMMUNITY)
Admission: EM | Disposition: A | Discharge status: Home / Self Care | Payer: Self-pay | Source: Home / Self Care | Attending: Internal Medicine

## 2018-04-25 HISTORY — PX: CARDIOVERSION: SHX1299

## 2018-04-25 LAB — GLUCOSE, CAPILLARY
Glucose-Capillary: 97 mg/dL (ref 70–99)
Glucose-Capillary: 94 mg/dL (ref 70–99)
Glucose-Capillary: 160 mg/dL — ABNORMAL HIGH (ref 70–99)
Glucose-Capillary: 142 mg/dL — ABNORMAL HIGH (ref 70–99)

## 2018-04-25 LAB — CBC
HCT: 42.1 % (ref 36.0–46.0)
Hemoglobin: 12.6 g/dL (ref 12.0–15.0)
MCH: 25.7 pg — ABNORMAL LOW (ref 26.0–34.0)
MCHC: 29.9 g/dL — ABNORMAL LOW (ref 30.0–36.0)
MCV: 85.7 fL (ref 80.0–100.0)
Platelets: 269 10*3/uL (ref 150–400)
RBC: 4.91 MIL/uL (ref 3.87–5.11)
RDW: 15.3 % (ref 11.5–15.5)
WBC: 7.4 10*3/uL (ref 4.0–10.5)
nRBC: 0 % (ref 0.0–0.2)

## 2018-04-25 LAB — APTT: aPTT: 71 seconds — ABNORMAL HIGH (ref 24–36)

## 2018-04-25 LAB — HEPARIN LEVEL (UNFRACTIONATED): Heparin Unfractionated: 0.73 IU/mL — ABNORMAL HIGH (ref 0.30–0.70)

## 2018-04-25 SURGERY — CARDIOVERSION
Anesthesia: General

## 2018-04-25 MED ORDER — LIDOCAINE HCL (CARDIAC) PF 100 MG/5ML IV SOSY
PREFILLED_SYRINGE | INTRAVENOUS | Status: DC | PRN
  Administered 2018-04-25: 80 mg via INTRAVENOUS

## 2018-04-25 MED ORDER — PROPOFOL 10 MG/ML IV BOLUS
INTRAVENOUS | Status: DC | PRN
  Administered 2018-04-25: 50 mg via INTRAVENOUS

## 2018-04-25 MED ORDER — APIXABAN 2.5 MG PO TABS
2.5000 mg | ORAL_TABLET | Freq: Two times a day (BID) | ORAL | Status: DC
  Administered 2018-04-26: 2.5 mg via ORAL
  Filled 2018-04-25: qty 1

## 2018-04-25 MED ORDER — DOCUSATE SODIUM 100 MG PO CAPS: 100.0000 mg | ORAL_CAPSULE | Freq: Two times a day (BID) | ORAL | 2 refills | Status: DC

## 2018-04-25 MED ORDER — DILTIAZEM HCL ER 120 MG PO CP12: 120.0000 mg | ORAL_CAPSULE | Freq: Every day | ORAL | 0 refills | Status: DC

## 2018-04-25 MED ORDER — HEPARIN (PORCINE) 25000 UT/250ML-% IV SOLN: 650.0000 [IU]/h | INTRAVENOUS | Status: DC

## 2018-04-25 MED ORDER — AMIODARONE HCL 200 MG PO TABS: 200.0000 mg | ORAL_TABLET | Freq: Two times a day (BID) | ORAL | 0 refills | Status: DC

## 2018-04-25 MED ORDER — DILTIAZEM HCL ER 60 MG PO CP12
120.0000 mg | ORAL_CAPSULE | Freq: Every day | ORAL | Status: DC
  Administered 2018-04-25 – 2018-04-26 (×2): 120 mg via ORAL
  Filled 2018-04-25 (×2): qty 2

## 2018-04-25 MED ORDER — APIXABAN 2.5 MG PO TABS
2.5000 mg | ORAL_TABLET | Freq: Two times a day (BID) | ORAL | Status: DC
  Filled 2018-04-25: qty 1

## 2018-04-25 MED ORDER — POLYETHYLENE GLYCOL 3350 17 G PO PACK: 17.0000 g | PACK | Freq: Every day | ORAL | 0 refills | Status: DC

## 2018-04-25 MED ORDER — SUCRALFATE 1 GM/10ML PO SUSP: 1.0000 g | Freq: Three times a day (TID) | ORAL | 0 refills | Status: DC

## 2018-04-25 MED ORDER — APIXABAN 5 MG PO TABS: 5.0000 mg | ORAL_TABLET | Freq: Two times a day (BID) | ORAL | 0 refills | Status: DC

## 2018-04-25 MED ORDER — ASPIRIN 325 MG PO TBEC: 325.0000 mg | DELAYED_RELEASE_TABLET | Freq: Every day | ORAL | 0 refills | Status: DC

## 2018-04-25 MED ORDER — APIXABAN 5 MG PO TABS
5.0000 mg | ORAL_TABLET | Freq: Two times a day (BID) | ORAL | Status: AC
  Administered 2018-04-25 (×2): 5 mg via ORAL
  Filled 2018-04-25 (×2): qty 1

## 2018-04-25 NOTE — Transfer of Care (Signed)
Immediate Anesthesia Transfer of Care Note  Patient: Kerry Barnes  Procedure(s) Performed: CARDIOVERSION (N/A )  Patient Location: Endoscopy Unit  Anesthesia Type:General  Level of Consciousness: awake, sedated and patient cooperative  Airway & Oxygen Therapy: Patient Spontanous Breathing and Patient connected to nasal cannula oxygen  Post-op Assessment: Report given to RN and Post -op Vital signs reviewed and stable  Post vital signs: Reviewed and stable  Last Vitals:  Vitals Value Taken Time  BP 119/59 04/25/2018  1:01 PM  Temp    Pulse 65 04/25/2018  1:01 PM  Resp 22 04/25/2018  1:01 PM  SpO2 100 % 04/25/2018  1:01 PM    Last Pain:  Vitals:   04/25/18 1147  TempSrc: Oral  PainSc: 0-No pain         Complications: No apparent anesthesia complications

## 2018-04-25 NOTE — CV Procedure (Signed)
Direct current cardioversion:  Indication symptomatic A. Flutter Procedure: Using 50 mg of IV Propofol and 80 IV Lidocaine (for reducing venous pain) for achieving deep sedation, synchronized direct current cardioversion performed. Patient was delivered with 75 Joules of electricity X 1 with success to NSR. Patient tolerated the procedure well. No immediate complication noted.

## 2018-04-25 NOTE — Progress Notes (Signed)
Patient returned from cardioversion, vitals stable and NSR on monitor. She denies symptoms other than constipation and husband is at bedside. Lunch tray ordered.

## 2018-04-25 NOTE — Anesthesia Preprocedure Evaluation (Addendum)
Anesthesia Evaluation  Patient identified by MRN, date of birth, ID band Patient awake    Reviewed: Allergy & Precautions, NPO status , Patient's Chart, lab work & pertinent test results  Airway Mallampati: II  TM Distance: >3 FB Neck ROM: Full    Dental no notable dental hx. (+) Teeth Intact, Dental Advisory Given   Pulmonary asthma ,    Pulmonary exam normal breath sounds clear to auscultation       Cardiovascular hypertension, Pt. on medications +CHF  + dysrhythmias (on eliquis, amiodarone) Atrial Fibrillation  Rhythm:Irregular Rate:Normal  TEE 2017 - Left ventricle: Systolic function was normal. The estimated ejection fraction was in the range of 55% to 60%. Wall motion was normal; there were no regional wall motion abnormalities. - Aortic valve: There was trivial regurgitation. - Mitral valve: There was mild regurgitation. - Left atrium: The atrium was moderately to severely dilated. No evidence of thrombus in the atrial cavity or appendage. - Right atrium: No evidence of thrombus in the atrial cavity or appendage. - Tricuspid valve: There was mild-moderate regurgitation. - Impressions: No evidence of thrombus.    Neuro/Psych PSYCHIATRIC DISORDERS Anxiety CVA    GI/Hepatic Neg liver ROS, GERD  Medicated,  Endo/Other  negative endocrine ROSdiabetes, Type 2  Renal/GU negative Renal ROS  negative genitourinary   Musculoskeletal  (+) Arthritis , Fibromyalgia -  Abdominal   Peds  Hematology negative hematology ROS (+)   Anesthesia Other Findings   Reproductive/Obstetrics                            Anesthesia Physical Anesthesia Plan  ASA: III  Anesthesia Plan: General   Post-op Pain Management:    Induction: Intravenous  PONV Risk Score and Plan: 3 and Treatment may vary due to age or medical condition and Propofol infusion  Airway Management Planned: Natural Airway and  Mask  Additional Equipment:   Intra-op Plan:   Post-operative Plan:   Informed Consent: I have reviewed the patients History and Physical, chart, labs and discussed the procedure including the risks, benefits and alternatives for the proposed anesthesia with the patient or authorized representative who has indicated his/her understanding and acceptance.   Dental advisory given  Plan Discussed with: CRNA  Anesthesia Plan Comments:        Anesthesia Quick Evaluation

## 2018-04-25 NOTE — Anesthesia Postprocedure Evaluation (Signed)
Anesthesia Post Note  Patient: Kerry Barnes  Procedure(s) Performed: CARDIOVERSION (N/A )     Patient location during evaluation: PACU Anesthesia Type: General Level of consciousness: awake and alert Pain management: pain level controlled Vital Signs Assessment: post-procedure vital signs reviewed and stable Respiratory status: spontaneous breathing, nonlabored ventilation, respiratory function stable and patient connected to nasal cannula oxygen Cardiovascular status: blood pressure returned to baseline and stable Postop Assessment: no apparent nausea or vomiting Anesthetic complications: no    Last Vitals:  Vitals:   04/25/18 1310 04/25/18 1346  BP: (!) 109/55 136/74  Pulse: (!) 58 62  Resp: 14 (!) 25  Temp:  36.8 C  SpO2: 99% 99%    Last Pain:  Vitals:   04/25/18 1346  TempSrc: Oral  PainSc:                  Chelsey L Woodrum

## 2018-04-25 NOTE — Interval H&P Note (Signed)
History and Physical Interval Note:  04/25/2018 12:50 PM  Kerry Barnes  has presented today for surgery, with the diagnosis of atrial fibrillation  The various methods of treatment have been discussed with the patient and family. After consideration of risks, benefits and other options for treatment, the patient has consented to  Procedure(s): CARDIOVERSION (N/A) as a surgical intervention .  The patient's history has been reviewed, patient examined, no change in status, stable for surgery.  I have reviewed the patient's chart and labs.  Questions were answered to the patient's satisfaction.     [name]

## 2018-04-25 NOTE — Progress Notes (Signed)
[place] for heparin Indication: chest pain/ACS  Allergies  Allergen Reactions  . Codeine Nausea Only  . Metformin Diarrhea  . Amoxicillin Rash  . Biaxin [Clarithromycin] Rash  . Ciprofloxacin Rash  . Penicillins Rash    Childhood allergic reaction Has patient had a PCN reaction causing immediate rash, facial/tongue/throat swelling, SOB or lightheadedness with hypotension: Yes Has patient had a PCN reaction causing severe rash involving mucus membranes or skin necrosis: No Has patient had a PCN reaction that required hospitalization: Yes Has patient had a PCN reaction occurring within the last 10 years: No If all of the above answers are "NO", then may proceed with Cephalosporin use.    Patient Measurements: Height: 5\' 6"  (167.6 cm) Weight: 152 lb 8.9 oz (69.2 kg) IBW/kg (Calculated) : 59.3 Heparin Dosing Weight: 69 kg  Vital Signs: Temp: 97.4 F (36.3 C) (11/11 0755) Temp Source: Oral (11/11 0755) BP: 106/71 (11/11 0755) Pulse Rate: 96 (11/11 0755)  Labs: Recent Labs    04/23/18 0629  04/23/18 1521 04/24/18 0244 04/24/18 1115 04/25/18 0420  HGB 14.7  --   --   --   --  12.6  HCT 46.9*  --   --   --   --  42.1  PLT 316  --   --   --   --  269  APTT  --   --   --  125* 108* 71*  HEPARINUNFRC  --   --   --  1.18*  --  0.73*  CREATININE 1.67*  --   --   --   --   --   TROPONINI  --    < > 0.05* 0.06* 0.04*  --    < > = values in this interval not displayed.    Estimated Creatinine Clearance: 24.3 mL/min (A) (by C-G formula based on SCr of 1.67 mg/dL (H)).   Medical History: Past Medical History:  Diagnosis Date  . Anxiety   . Arthritis   . Asthma   . Atypical atrial flutter ([place])   . Cataract   . Chronic diastolic CHF (congestive heart failure) ([place])   . Fibromyalgia   . GERD (gastroesophageal reflux disease)   . History of blood transfusion   . Hyperlipemia   . Hypertension   . IBS (irritable bowel syndrome)   .  Seasonal allergies   . Stroke [facility]) 2013   TIA  . Tubular adenoma of colon   . Type 2 diabetes mellitus (HCC)      Assessment: 39 yoF on Eliquis PTA for PAF now with EKG changes. Pt to start on IV heparin, last dose of apixaban was 11/8 prior to admission.    PTT in goal range this morning.  Heparin level still falsely elevated from recent Eliquis.  Spoke to Dr. Einar Gip this morning, would like to restart PTA Eliquis, however, patient scheduled for DCCV at 1230 today.    Goal of Therapy:  Heparin level 0.3-0.7 units/ml aPTT 66-102 seconds seconds Monitor platelets by anticoagulation protocol: Yes   Plan:  -Will resume Eliquis this morning.  Give 5 mg BID today to boost to steady state more quickly. -Will resume PTA dose of Eliquis 2.5 mg BID tomorrow.  Marguerite Olea, [facility] Clinical Pharmacist Phone [phone]  04/25/2018 9:14 AM

## 2018-04-26 ENCOUNTER — Inpatient Hospital Stay (HOSPITAL_COMMUNITY): Payer: PPO

## 2018-04-26 DIAGNOSIS — I484 Atypical atrial flutter: Principal | ICD-10-CM

## 2018-04-26 DIAGNOSIS — R1032 Left lower quadrant pain: Secondary | ICD-10-CM

## 2018-04-26 LAB — BASIC METABOLIC PANEL
Anion gap: 6 (ref 5–15)
BUN: 11 mg/dL (ref 8–23)
CO2: 24 mmol/L (ref 22–32)
Calcium: 8.9 mg/dL (ref 8.9–10.3)
Chloride: 106 mmol/L (ref 98–111)
Creatinine, Ser: 1.48 mg/dL — ABNORMAL HIGH (ref 0.44–1.00)
GFR calc Af Amer: 37 mL/min — ABNORMAL LOW (ref >60)
GFR calc non Af Amer: 32 mL/min — ABNORMAL LOW (ref >60)
Glucose, Bld: 104 mg/dL — ABNORMAL HIGH (ref 70–99)
Potassium: 4.6 mmol/L (ref 3.5–5.1)
Sodium: 136 mmol/L (ref 135–145)

## 2018-04-26 LAB — GLUCOSE, CAPILLARY
Glucose-Capillary: 127 mg/dL — ABNORMAL HIGH (ref 70–99)
Glucose-Capillary: 131 mg/dL — ABNORMAL HIGH (ref 70–99)

## 2018-04-26 LAB — CBC
HCT: 38.7 % (ref 36.0–46.0)
Hemoglobin: 12.1 g/dL (ref 12.0–15.0)
MCH: 26.9 pg (ref 26.0–34.0)
MCHC: 31.3 g/dL (ref 30.0–36.0)
MCV: 86.2 fL (ref 80.0–100.0)
Platelets: 237 10*3/uL (ref 150–400)
RBC: 4.49 MIL/uL (ref 3.87–5.11)
RDW: 15.6 % — ABNORMAL HIGH (ref 11.5–15.5)
WBC: 7 10*3/uL (ref 4.0–10.5)
nRBC: 0 % (ref 0.0–0.2)

## 2018-04-26 MED ORDER — FLEET ENEMA 7-19 GM/118ML RE ENEM: 1.0000 | ENEMA | Freq: Every day | RECTAL | Status: DC | PRN

## 2018-04-26 MED ORDER — BISACODYL 10 MG RE SUPP: 10.0000 mg | Freq: Once | RECTAL | Status: DC

## 2018-04-26 MED ORDER — AMIODARONE HCL 200 MG PO TABS: 200.0000 mg | ORAL_TABLET | Freq: Two times a day (BID) | ORAL | 0 refills | Status: DC

## 2018-04-26 MED ORDER — POLYETHYLENE GLYCOL 3350 17 G PO PACK
34.0000 g | PACK | Freq: Once | ORAL | Status: AC
  Administered 2018-04-26: 34 g via ORAL
  Filled 2018-04-26: qty 2

## 2018-04-26 MED ORDER — LACTULOSE 10 GM/15ML PO SOLN
30.0000 g | Freq: Once | ORAL | Status: AC
  Administered 2018-04-26: 30 g via ORAL
  Filled 2018-04-26: qty 45

## 2018-04-26 MED ORDER — PSYLLIUM 95 % PO PACK: 1.0000 | PACK | Freq: Every day | ORAL | Status: DC

## 2018-04-26 MED ORDER — BISACODYL 10 MG RE SUPP: 10.0000 mg | Freq: Every day | RECTAL | Status: DC | PRN

## 2018-04-26 MED ORDER — DILTIAZEM HCL ER 120 MG PO CP12: 120.0000 mg | ORAL_CAPSULE | Freq: Every day | ORAL | 0 refills | Status: DC

## 2018-04-26 NOTE — Care Management Note (Signed)
Case Management Note Marvetta Gibbons RN, BSN Transitions of Care Unit 4E- RN Case Manager [phone]  Patient Details  Name: ALVENIA TREESE MRN: [id number] Date of Birth: July 17, 1933  Subjective/Objective:     Pt admitted with chest pressure, afib s/p cardioversion               Action/Plan: PTA pt lived at home with spouse- recommendation for outpt PT- spoke with pt at bedside who is agreeable to outpt PT and would like referral to [facility] location. Verbal order given by MD for outpt PT- referral placed in epic to [address] location. No further CM needs noted- spouse to come transport pt home.   Expected Discharge Date:  04/26/18               Expected Discharge Plan:  OP Rehab  In-House Referral:     Discharge planning Services  CM Consult  Post Acute Care Choice:  NA Choice offered to:  NA  DME Arranged:    DME Agency:     HH Arranged:    HH Agency:     Status of Service:  Completed, signed off  If discussed at [place] of Stay Meetings, dates discussed:    Discharge Disposition: home/self care   Additional Comments:  Dawayne Patricia, RN 04/26/2018, 4:11 PM

## 2018-04-26 NOTE — Evaluation (Signed)
Physical Therapy Evaluation Patient Details Name: Kerry Barnes MRN: [id number] DOB: January 02, 1934 Today's Date: 04/26/2018   History of Present Illness  84yo female admitted to hospital with abdominal pain and epigastric discomfort, also with chest pain. EKG shows acute ST depression, found to have atypical A-flutter with RVR related to ST-T wave changes. Received cardioversion 04/25/18. PMH atypical A-flutter, CHF, fibromyalgia, HTN, IBS, TIA, DM, cardioversion, ankle fracture, wrist fracture   Clinical Impression   Patient received in bed, pleasant but reporting high pain levels in stomach due to not having had BM for some time; RN aware and pre-medicated patient. Both RN and husband very encouraging of patient participating in PT today. She was able to perform bed mobility with Mod(I) and extended time, and required only S for transfers with RW and gait approximately 154f with RW today. Gait distance limited by fatigue, patient reporting difficulty of gait distance as being 9/10 due to general weakness. She was left up in the chair with all needs met, all questions/concerns addressed. She will continue to benefit from skilled PT services in the acute setting as well as skilled OP PT services moving forward to address functional strength and activity tolerance deficits.     Follow Up Recommendations Outpatient PT    Equipment Recommendations  None recommended by PT(has all necessary DME )    Recommendations for Other Services       Precautions / Restrictions Precautions Precautions: Fall Restrictions Weight Bearing Restrictions: No      Mobility  Bed Mobility Overal bed mobility: Modified Independent             General bed mobility comments: extended time and effort   Transfers Overall transfer level: Needs assistance Equipment used: Rolling walker (2 wheeled) Transfers: Sit to/from Stand Sit to Stand: Supervision         General transfer comment: S for safety,  VC for sequencing, no physical assist given   Ambulation/Gait Ambulation/Gait assistance: Supervision Gait Distance (Feet): 150 Feet Assistive device: Rolling walker (2 wheeled) Gait Pattern/deviations: Step-through pattern;Decreased step length - right;Decreased step length - left;Decreased stride length;Trunk flexed Gait velocity: decreased    General Gait Details: reduced gait speed due to pain but gait mechanics generally WNL  Stairs            Wheelchair Mobility    Modified Rankin (Stroke Patients Only)       Balance Overall balance assessment: No apparent balance deficits (not formally assessed)                                           Pertinent Vitals/Pain Pain Assessment: 0-10 Pain Score: 9  Pain Location: stomach pain  Pain Descriptors / Indicators: Aching;Sharp Pain Intervention(s): Limited activity within patient's tolerance;Monitored during session;Premedicated before session    Home Living Family/patient expects to be discharged to:: Private residence Living Arrangements: Spouse/significant other Available Help at Discharge: Family;Available PRN/intermittently Type of Home: House Home Access: Level entry     Home Layout: One level Home Equipment: Walker - 2 wheels;Walker - 4 wheels;Cane - single point;Grab bars - tub/shower;Shower seat      Prior Function Level of Independence: Independent with assistive device(s)         Comments: uses cane independently, drives; husband has lawn service and she takes care of the home and shopping      Hand Dominance   Dominant Hand:  Right    Extremity/Trunk Assessment   Upper Extremity Assessment Upper Extremity Assessment: Overall WFL for tasks assessed    Lower Extremity Assessment Lower Extremity Assessment: Generalized weakness    Cervical / Trunk Assessment Cervical / Trunk Assessment: Normal  Communication   Communication: No difficulties  Cognition Arousal/Alertness:  Awake/alert Behavior During Therapy: WFL for tasks assessed/performed Overall Cognitive Status: Within Functional Limits for tasks assessed                                        General Comments      Exercises     Assessment/Plan    PT Assessment Patient needs continued PT services  PT Problem List Decreased strength;Decreased coordination;Decreased activity tolerance       PT Treatment Interventions DME instruction;Therapeutic activities;Gait training;Therapeutic exercise;Patient/family education;Stair training;Balance training;Functional mobility training;Neuromuscular re-education    PT Goals (Current goals can be found in the Care Plan section)  Acute Rehab PT Goals Patient Stated Goal: get stronger, have BM  PT Goal Formulation: With patient Time For Goal Achievement: 05/10/18 Potential to Achieve Goals: Good    Frequency Min 3X/week   Barriers to discharge        Co-evaluation               AM-PAC PT "6 Clicks" Daily Activity  Outcome Measure Difficulty turning over in bed (including adjusting bedclothes, sheets and blankets)?: None Difficulty moving from lying on back to sitting on the side of the bed? : None Difficulty sitting down on and standing up from a chair with arms (e.g., wheelchair, bedside commode, etc,.)?: None Help needed moving to and from a bed to chair (including a wheelchair)?: A Little Help needed walking in hospital room?: A Little Help needed climbing 3-5 steps with a railing? : A Little 6 Click Score: 21    End of Session   Activity Tolerance: Patient tolerated treatment well Patient left: in chair;with call bell/phone within reach Nurse Communication: Mobility status PT Visit Diagnosis: Muscle weakness (generalized) (M62.81);Pain Pain - Right/Left: (abdominal ) Pain - part of body: (abdominal pain )    Time: 1041-1101 PT Time Calculation (min) (ACUTE ONLY): 20 min   Charges:   PT Evaluation $PT Eval Low  Complexity: 1 Low          Deniece Ree PT, DPT, CBIS  Supplemental Physical Therapist [place]    Pager [phone] Acute Rehab Office [phone]

## 2018-04-26 NOTE — Progress Notes (Addendum)
Subjective:  Doing well, asymptomatic.  Has been weaned off of diltiazem drip.  No palpitations or chest pain.  Objective:  Vital Signs in the last 24 hours: Temp:  [97.4 F (36.3 C)-98.5 F (36.9 C)] 98.2 F (36.8 C) (11/12 1110) Pulse Rate:  [57-112] 63 (11/12 1110) Resp:  [11-25] 13 (11/12 1110) BP: (107-143)/(46-81) 131/53 (11/12 1110) SpO2:  [96 %-100 %] 98 % (11/12 1110)  Intake/Output from previous day: 11/11 0701 - 11/12 0700 In: 683 [P.O.:480; I.V.:203] Out: -   Physical Exam:  General appearance: alert, cooperative, appears stated age and no distress Eyes: negative findings: lids and lashes normal Neck: no adenopathy, no carotid bruit, no JVD, supple, symmetrical, trachea midline and thyroid not enlarged, symmetric, no tenderness/mass/nodules Neck: JVP - normal, carotids 2+= without bruits Resp: clear to auscultation bilaterally Chest wall: no tenderness Cardio: regular rate and rhythm, S1, S2 normal, no murmur, click, rub or gallop GI: soft, non-tender; bowel sounds normal; no masses,  no organomegaly Extremities: extremities normal, atraumatic, no cyanosis or edema  Lab Results: BMP Recent Labs    04/27/17 0234 04/23/18 0629 04/26/18 0737  NA 138 138 136  K 4.1 3.5 4.6  CL 112* 105 106  CO2 22 21* 24  GLUCOSE 71 127* 104*  BUN 16 17 11   CREATININE 1.85* 1.67* 1.48*  CALCIUM 8.4* 9.6 8.9  GFRNONAA 24* 27* 32*  GFRAA 28* 32* 37*    CBC Recent Labs  Lab 04/26/18 0737  WBC 7.0  RBC 4.49  HGB 12.1  HCT 38.7  PLT 237  MCV 86.2  MCH 26.9  MCHC 31.3  RDW 15.6*    HEMOGLOBIN A1C Lab Results  Component Value Date   HGBA1C (H) 09/08/2010    6.3 (NOTE)                                                                       According to the ADA Clinical Practice Recommendations for 2011, when HbA1c is used as a screening test:   >=6.5%   Diagnostic of Diabetes Mellitus           (if abnormal result  is confirmed)  5.7-6.4%   Increased risk of  developing Diabetes Mellitus  References:Diagnosis and Classification of Diabetes Mellitus,Diabetes Care,2011,34(Suppl 1):S62-S69 and Standards of Medical Care in         Diabetes - 2011,Diabetes Care,2011,34  (Suppl 1):S11-S61.   MPG 134 (H) 09/08/2010    Cardiac Panel (last 3 results) Recent Labs    04/23/18 1521 04/24/18 0244 04/24/18 1115  TROPONINI 0.05* 0.06* 0.04*    BNP (last 3 results) No results for input(s): PROBNP in the last 8760 hours.  TSH No results for input(s): TSH in the last 8760 hours.  Lipid Panel     Component Value Date/Time   CHOL (H) 09/09/2010 0110    206        ATP III CLASSIFICATION:  <200     mg/dL   Desirable  200-239  mg/dL   Borderline High  >=240    mg/dL   High          TRIG 129 09/09/2010 0110   HDL 51 09/09/2010 0110   CHOLHDL 4.0 09/09/2010 0110   VLDL 26 09/09/2010  0110   LDLCALC (H) 09/09/2010 0110    129        Total Cholesterol/HDL:CHD Risk Coronary Heart Disease Risk Table                     Men   Women  1/2 Average Risk   3.4   3.3  Average Risk       5.0   4.4  2 X Average Risk   9.6   7.1  3 X Average Risk  23.4   11.0        Use the calculated Patient Ratio above and the CHD Risk Table to determine the patient's CHD Risk.        ATP III CLASSIFICATION (LDL):  <100     mg/dL   Optimal  100-129  mg/dL   Near or Above                    Optimal  130-159  mg/dL   Borderline  160-189  mg/dL   High  >190     mg/dL   Very High     Hepatic Function Panel Recent Labs    04/23/18 0629  PROT 7.6  ALBUMIN 3.9  AST 32  ALT 16  ALKPHOS 88  BILITOT 1.2  BILIDIR 0.2  IBILI 1.0*   Imaging: Ct Abdomen Pelvis Wo Contrast  Result Date: 04/24/2018 CLINICAL DATA:  84 year old female with history of diabetes and chronic kidney disease. Pulmonary hypertension. History of chest and epigastric abdominal pain. EXAM: CT ABDOMEN AND PELVIS WITHOUT CONTRAST TECHNIQUE: Multidetector CT imaging of the abdomen and pelvis was  performed following the standard protocol without IV contrast. COMPARISON:  CT the abdomen and pelvis 08/11/2017. FINDINGS: Lower chest: Mild scarring in the lung bases bilaterally. Hepatobiliary: No definite cystic or solid hepatic lesions are noted on today's noncontrast CT examination. Status post cholecystectomy. Pancreas: No definite pancreatic mass or peripancreatic fluid or inflammatory changes are noted on today's noncontrast CT examination. Spleen: Unremarkable. Adrenals/Urinary Tract: Unenhanced appearance of the kidneys and bilateral adrenal glands is normal. No hydroureteronephrosis. Urinary bladder is filled with iodinated contrast material (presumably from today's earlier CTA examination). Stomach/Bowel: Unenhanced appearance of the stomach is normal. No pathologic dilatation of small bowel or colon. Normal appendix. Vascular/Lymphatic: Aortic atherosclerosis. No lymphadenopathy noted in the abdomen or pelvis. Reproductive: Unenhanced appearance of the uterus and ovaries is unremarkable. Other: No significant volume of ascites.  No pneumoperitoneum. Musculoskeletal: There are no aggressive appearing lytic or blastic lesions noted in the visualized portions of the skeleton. IMPRESSION: 1. No acute findings are noted in the abdomen or pelvis to account for the patient's symptoms. 2. Normal appendix. 3. Aortic atherosclerosis. Electronically Signed   By: Vinnie Langton [name]   On: 04/24/2018 18:46   Dg Abd Portable 1v  Result Date: 04/26/2018 CLINICAL DATA:  Abdominal pain EXAM: PORTABLE ABDOMEN - 1 VIEW COMPARISON:  04/24/2018 FINDINGS: Contrast material from previous CT is noted throughout the colon. No obstructive changes are noted. No free air is seen. No abnormal mass or abnormal calcifications are noted. IMPRESSION: No acute abnormality noted. Electronically Signed   By: Inez Catalina [name]   On: 04/26/2018 09:48    Scheduled Meds: . amiodarone  200 mg Oral BID  . apixaban  2.5 mg Oral BID   . aspirin EC  325 mg Oral Daily  . atorvastatin  40 mg Oral Daily  . darifenacin  7.5 mg Oral Daily  .  diltiazem  120 mg Oral Daily  . fluticasone  1 spray Each Nare Daily  . insulin aspart  0-5 Units Subcutaneous QHS  . insulin aspart  0-9 Units Subcutaneous TID WC  . loratadine  10 mg Oral Daily  . lubiprostone  24 mcg Oral BID WC  . mometasone-formoterol  2 puff Inhalation BID  . montelukast  10 mg Oral QHS  . olopatadine  1 drop Both Eyes BID  . pantoprazole  40 mg Oral BID  . polyethylene glycol  34 g Oral Once  . pregabalin  75 mg Oral BID  . sodium chloride flush  3 mL Intravenous Q12H  . sucralfate  1 g Oral TID WC & HS   Continuous Infusions: . sodium chloride Stopped (04/25/18 1307)   PRN Meds:.acetaminophen, ALPRAZolam, bisacodyl, levalbuterol, meclizine, methocarbamol, morphine injection, ondansetron (ZOFRAN) IV, polyvinyl alcohol, sodium chloride flush, sodium phosphate   Cardiac Studies:  Telemetry 04/26/2018: Normal sinus rhythm last 18 hours.  EKG 04/25/2018: Sinus bradycardia at the rate of 58 bpm, normal axis, nonspecific T abnormality.  EKG 04/23/2018: Atypical atrial flutter with 2: 1 conduction with RVR, ventricular rate 110 bpm, borderline criteria for LVH, ST-T abnormality, consider anterolateral subendocardial ischemia versus infarct. EKG 04/24/2018: Atypical atrial flutter with variable AV conduction, controlled ventricular rate at 63 bpm, nonspecific T abnormality.  Borderline criteria for LVH.  Echocardiogram 02/01/2016: Normal LVEF, mild calcification of aortic annulus, moderate to severely enlarged left atrium, mild MR and mild pulmonary hypertension.  Lexiscan nuclear stress test 2015: Negative for ischemia. Assessment/Plan:  1.  Atypical atrial flutter with rapid ventricular response. CHA2DS2-VASCScore: Risk Score  7,  Yearly risk of stroke  9.6. Recommendation: ASA No/Anticoagulation Yes  2.  Hypertension 3.  History of TIA 4.  Diabetes  mellitus type 2 controlled with stage III chronic kidney disease without hyperglycemia 5.  Essential hypertension 6. Constipation: Will give Dulcolax suppository. Advised her to take metamucil daily before bedtime.  Recommendation:  Patient can be discharged home, would increase diltiazem dose to 120 mg daily, continue amiodarone 200 mg p.o. twice daily until seen by [place] in 10 days, appointment already scheduled.  Continue Eliquis at 2.5 mg p.o. twice daily.  Blood pressure is well controlled.  Discontinue aspirin as it increases the risk of bleed especially in view of being on amiodarone as well.  Renal function has remained stable.   [name], [name] 04/26/2018, 11:46 AM [facility] Pager: [phone] Office: [phone] If no answer: [phone]

## 2018-04-26 NOTE — Discharge Instructions (Signed)
Follow with Primary MD Aletha Halim., PA-C in 7 days   Get CBC, CMP,checked  by Primary MD next visit.    Activity: As tolerated with Full fall precautions use walker/cane & assistance as needed   Disposition Home    Diet: Heart Healthy  , with feeding assistance and aspiration precautions.  For Heart failure patients - Check your Weight same time everyday, if you gain over 2 pounds, or you develop in leg swelling, experience more shortness of breath or chest pain, call your Primary MD immediately. Follow Cardiac Low Salt Diet and 1.5 lit/day fluid restriction.   On your next visit with your primary care physician please Get Medicines reviewed and adjusted.   Please request your Prim.MD to go over all Hospital Tests and Procedure/Radiological results at the follow up, please get all Hospital records sent to your Prim MD by signing hospital release before you go home.   If you experience worsening of your admission symptoms, develop shortness of breath, life threatening emergency, suicidal or homicidal thoughts you must seek medical attention immediately by calling 911 or calling your MD immediately  if symptoms less severe.  You Must read complete instructions/literature along with all the possible adverse reactions/side effects for all the Medicines you take and that have been prescribed to you. Take any new Medicines after you have completely understood and accpet all the possible adverse reactions/side effects.   Do not drive, operating heavy machinery, perform activities at heights, swimming or participation in water activities or provide baby sitting services if your were admitted for syncope or siezures until you have seen by Primary MD or a Neurologist and advised to do so again.  Do not drive when taking Pain medications.    Do not take more than prescribed Pain, Sleep and Anxiety Medications  Special Instructions: If you have smoked or chewed Tobacco  in the last 2 yrs  please stop smoking, stop any regular Alcohol  and or any Recreational drug use.  Wear Seat belts while driving.   Please note  You were cared for by a hospitalist during your hospital stay. If you have any questions about your discharge medications or the care you received while you were in the hospital after you are discharged, you can call the unit and asked to speak with the hospitalist on call if the hospitalist that took care of you is not available. Once you are discharged, your primary care physician will handle any further medical issues. Please note that NO REFILLS for any discharge medications will be authorized once you are discharged, as it is imperative that you return to your primary care physician (or establish a relationship with a primary care physician if you do not have one) for your aftercare needs so that they can reassess your need for medications and monitor your lab values.

## 2018-04-26 NOTE — Progress Notes (Signed)
Patient is ready for discharge. She has all of her belongings with her. All questions regarding her discharge have been answered. Patient did have a large bowel movement prior to her discharge. She is alert and oriented. Patient has been removed from telemetry and [facility] notified. Both IV's have been removed without complication, catheter intact. Patient will be transported home by her husband. She will leave unit by wheelchair and meet her husband at the ED entrance.

## 2018-04-26 NOTE — Discharge Summary (Signed)
Kerry Barnes, is a 84 y.o. female  DOB 23-Jun-1933  MRN [id number].  Admission date:  04/23/2018  Admitting Physician  Sid Falcon, MD  Discharge Date:  04/26/2018   Primary MD  Aletha Halim., PA-C  Recommendations for primary care physician for things to follow:  -Please check CBC, BMP during next visit -Patient to continue amiodarone 200 mg oral twice daily till she seen by cardiology in 10 days, then further dosing up to cardiology -Outpatient work-up for incidental finding of thyroid nodule on imaging, with a thyroid function test and thyroid ultrasound   Admission Diagnosis  Epigastric abdominal pain [R10.13] Atypical chest pain [R07.89] Abdominal pain [R10.9]   Discharge Diagnosis  Epigastric abdominal pain [R10.13] Atypical chest pain [R07.89] Abdominal pain [R10.9]    Active Problems:   Type 2 diabetes with stage 3 chronic kidney disease GFR 30-59 (HCC)   Hyperlipidemia   Essential hypertension   Chronic diastolic heart failure (HCC)   IBS   Chronic renal insufficiency, stage III (moderate) (HCC)   Asthma   Chest pressure   Abdominal pain      Past Medical History:  Diagnosis Date  . Anxiety   . Arthritis   . Asthma   . Atypical atrial flutter ([place])   . Cataract   . Chronic diastolic CHF (congestive heart failure) ([place])   . Fibromyalgia   . GERD (gastroesophageal reflux disease)   . History of blood transfusion   . Hyperlipemia   . Hypertension   . IBS (irritable bowel syndrome)   . Seasonal allergies   . Stroke [facility]) 2013   TIA  . Tubular adenoma of colon   . Type 2 diabetes mellitus ([place])     Past Surgical History:  Procedure Laterality Date  . ANKLE FRACTURE SURGERY    . CARDIOVERSION N/A 02/04/2016   Procedure: CARDIOVERSION;  Surgeon: Sueanne Margarita, MD;  Location: [facility] ENDOSCOPY;  Service: Cardiovascular;  Laterality: N/A;  . CARDIOVERSION N/A  05/01/2016   Procedure: CARDIOVERSION;  Surgeon: Fay Records, MD;  Location: [facility] ENDOSCOPY;  Service: Cardiovascular;  Laterality: N/A;  . CARDIOVERSION N/A 04/25/2018   Procedure: CARDIOVERSION;  Surgeon: [name], MD;  Location: [place];  Service: Cardiovascular;  Laterality: N/A;  . CHOLECYSTECTOMY    . TEE WITHOUT CARDIOVERSION N/A 02/04/2016   Procedure: TRANSESOPHAGEAL ECHOCARDIOGRAM (TEE);  Surgeon: Sueanne Margarita, MD;  Location: [facility] ENDOSCOPY;  Service: Cardiovascular;  Laterality: N/A;  . TUBAL LIGATION    . WRIST FRACTURE SURGERY         History of present illness and  Hospital Course:     Kindly see H&P for history of present illness and admission details, please review complete Labs, Consult reports and Test reports for all details in brief  HPI  from the history and physical done on the day of admission 04/23/2018  HPI: Kerry Barnes is a 84 y.o. female with medical history significant of TIA, IBS, HTN, HLD, GERD, FM, DM2, chronic diastolic CHF, Afib, Anxiety, arthritis,  asthma, pulmonary HTN who presents for chest pressure.  Kerry Barnes reports starting yesterday morning, upon eating a banana, she developed nausea and vomiting.  She tried to take her pills, but then vomited them up.  She is not sure how many of them stayed down.  Even water was coming back up.  She went to bed and woke up this morning with severe chest pressure and abdominal pain.  She is not sure which started first.  The pain was in her middle chest and down to her epigastrium.  She also had pain in her LLQ.  The pain radiated to her neck, was associated with nausea and tachycardia.  The pain was a 9/10 and improved with rest and medications in the ED, but only down to about an 8/10.  She has had comcomitant headache, dizziness and blurry vision.  She denies diaphoresis, diarrhea, abdominal swelling, blood in the emesis or stool, change in bowel habits.  She does report feeling cold this morning.  She has  no sick contacts.  Given concern for abdominal and chest pain, CT of the chest abdomen and pelvis was done and showed no acute pathology, but atherosclerosis in the aorta and coronary arteries and some other chronic findings.    ED Course: In the ED, she was noted to have a troponin of 0.07, EKG showed downward sloping of in the inferolateral leads which were new from last in 2017 and concerning for repolarization vs. Global ischemia.    Hospital Course   Atypical a flutter with RVR -Treatment per cardiology, chads 2 Vascor of 7, she is on Eliquis, for cardioversion, her amiodarone has been increased to 200 twice daily, as well her Cardizem has been increased as well, she is to follow with cardiology in 10 days regarding further recommendations.  Chest pain - likely due to to a flutter with RVR, cardiology input appreciated  Abdominal pain/constipation -CT abdomen pelvis on admission with no acute findings, as well KUB at day of discharge, patient to continue stool softener and laxatives as an outpatient -Patient with known history of IBS, chronic issue  CKD stage III -At baseline  Anxiety -Continue with home meds   Discharge Condition:  stable   Follow UP  Follow-up Information    Nigel Mormon, MD Follow up on 05/06/2018.   Specialty:  Cardiology Why:  12 noon appointment. Bring all medications 15 min early. Contact information: [address] [place] [address] [phone]             Discharge Instructions  and  Discharge Medications    Discharge Instructions    Diet - low sodium heart healthy   Complete by:  As directed    Discharge instructions   Complete by:  As directed    Follow with Primary MD Aletha Halim., PA-C in 7 days   Get CBC, CMP,checked  by Primary MD next visit.    Activity: As tolerated with Full fall precautions use walker/cane & assistance as needed   Disposition Home    Diet: Heart Healthy  , with feeding  assistance and aspiration precautions.  For Heart failure patients - Check your Weight same time everyday, if you gain over 2 pounds, or you develop in leg swelling, experience more shortness of breath or chest pain, call your Primary MD immediately. Follow Cardiac Low Salt Diet and 1.5 lit/day fluid restriction.   On your next visit with your primary care physician please Get Medicines reviewed and adjusted.  Please request your Prim.MD to go over all Hospital Tests and Procedure/Radiological results at the follow up, please get all Hospital records sent to your Prim MD by signing hospital release before you go home.   If you experience worsening of your admission symptoms, develop shortness of breath, life threatening emergency, suicidal or homicidal thoughts you must seek medical attention immediately by calling 911 or calling your MD immediately  if symptoms less severe.  You Must read complete instructions/literature along with all the possible adverse reactions/side effects for all the Medicines you take and that have been prescribed to you. Take any new Medicines after you have completely understood and accpet all the possible adverse reactions/side effects.   Do not drive, operating heavy machinery, perform activities at heights, swimming or participation in water activities or provide baby sitting services if your were admitted for syncope or siezures until you have seen by Primary MD or a Neurologist and advised to do so again.  Do not drive when taking Pain medications.    Do not take more than prescribed Pain, Sleep and Anxiety Medications  Special Instructions: If you have smoked or chewed Tobacco  in the last 2 yrs please stop smoking, stop any regular Alcohol  and or any Recreational drug use.  Wear Seat belts while driving.   Please note  You were cared for by a hospitalist during your hospital stay. If you have any questions about your discharge medications or the care  you received while you were in the hospital after you are discharged, you can call the unit and asked to speak with the hospitalist on call if the hospitalist that took care of you is not available. Once you are discharged, your primary care physician will handle any further medical issues. Please note that NO REFILLS for any discharge medications will be authorized once you are discharged, as it is imperative that you return to your primary care physician (or establish a relationship with a primary care physician if you do not have one) for your aftercare needs so that they can reassess your need for medications and monitor your lab values.   Increase activity slowly   Complete by:  As directed    Increase activity slowly   Complete by:  As directed      Allergies as of 04/26/2018      Reactions   Codeine Nausea Only   Metformin Diarrhea   Amoxicillin Rash   Biaxin [clarithromycin] Rash   Ciprofloxacin Rash   Penicillins Rash   Childhood allergic reaction Has patient had a PCN reaction causing immediate rash, facial/tongue/throat swelling, SOB or lightheadedness with hypotension: Yes Has patient had a PCN reaction causing severe rash involving mucus membranes or skin necrosis: No Has patient had a PCN reaction that required hospitalization: Yes Has patient had a PCN reaction occurring within the last 10 years: No If all of the above answers are "NO", then may proceed with Cephalosporin use.      Medication List    STOP taking these medications   acetaminophen 325 MG tablet Commonly known as:  TYLENOL   doxycycline 100 MG capsule Commonly known as:  VIBRAMYCIN   ranitidine 150 MG capsule Commonly known as:  ZANTAC   traMADol 50 MG tablet Commonly known as:  ULTRAM     TAKE these medications   albuterol 108 (90 Base) MCG/ACT inhaler Commonly known as:  PROVENTIL HFA;VENTOLIN HFA Inhale into the lungs every 6 (six) hours as needed for wheezing or shortness  of breath.     ALPRAZolam 0.5 MG tablet Commonly known as:  XANAX Take 0.5 mg by mouth 2 (two) times daily as needed for anxiety.   amiodarone 200 MG tablet Commonly known as:  PACERONE Take 1 tablet (200 mg total) by mouth 2 (two) times daily. What changed:  when to take this   apixaban 2.5 MG Tabs tablet Commonly known as:  ELIQUIS Take 1 tablet by mouth twice daily, need to schedule MD appt for further refills   atorvastatin 40 MG tablet Commonly known as:  LIPITOR Take 40 mg by mouth daily.   cetirizine 10 MG tablet Commonly known as:  ZYRTEC Take 10 mg by mouth daily as needed for allergies.   diltiazem 120 MG 12 hr capsule Commonly known as:  CARDIZEM SR Take 1 capsule (120 mg total) by mouth daily. What changed:    medication strength  how much to take   docusate sodium 100 MG capsule Commonly known as:  COLACE Take 1 capsule (100 mg total) by mouth 2 (two) times daily.   EPINEPHrine 0.3 mg/0.3 mL Soaj injection Commonly known as:  EPI-PEN Inject 0.3 mg once as needed into the muscle (severe allergic reaction).   Fluticasone-Salmeterol 100-50 MCG/DOSE Aepb Commonly known as:  ADVAIR Inhale 1 puff into the lungs every 12 (twelve) hours.   ibandronate 150 MG tablet Commonly known as:  BONIVA Take 150 mg by mouth every 30 (thirty) days.   lubiprostone 24 MCG capsule Commonly known as:  AMITIZA Take 1 capsule (24 mcg total) by mouth 2 (two) times daily with a meal.   meclizine 25 MG tablet Commonly known as:  ANTIVERT Take 25 mg by mouth 3 (three) times daily as needed for dizziness.   methocarbamol 500 MG tablet Commonly known as:  ROBAXIN Take 1 tablet (500 mg total) every 6 (six) hours as needed by mouth for muscle spasms.   mometasone 50 MCG/ACT nasal spray Commonly known as:  NASONEX Place 2 sprays into the nose daily as needed (nasal congestion).   montelukast 10 MG tablet Commonly known as:  SINGULAIR Take 10 mg at bedtime by mouth.   olopatadine 0.1 %  ophthalmic solution Commonly known as:  PATANOL Place 1 drop into both eyes 2 (two) times daily.   pantoprazole 40 MG tablet Commonly known as:  PROTONIX Take 1 tablet (40 mg total) by mouth 2 (two) times daily. Take before breakfast and dinner   polyethylene glycol packet Commonly known as:  MIRALAX / GLYCOLAX Take 17 g by mouth daily.   pregabalin 75 MG capsule Commonly known as:  LYRICA Take 75 mg by mouth 2 (two) times daily.   solifenacin 5 MG tablet Commonly known as:  VESICARE Take 5 mg by mouth daily.   sucralfate 1 GM/10ML suspension Commonly known as:  CARAFATE Take 10 mLs (1 g total) by mouth 4 (four) times daily -  with meals and at bedtime.   SYSTANE BALANCE OP Place 1 drop daily as needed into both eyes (dryness).         Diet and Activity recommendation: See Discharge Instructions above   Consults obtained -  Cardiology Dr. Einar Gip   Major procedures and Radiology Reports - PLEASE review detailed and final reports for all details, in brief -   Diversion   Ct Abdomen Pelvis Wo Contrast  Result Date: 04/24/2018 CLINICAL DATA:  84 year old female with history of diabetes and chronic kidney disease. Pulmonary hypertension. History of chest and epigastric abdominal pain. EXAM:  CT ABDOMEN AND PELVIS WITHOUT CONTRAST TECHNIQUE: Multidetector CT imaging of the abdomen and pelvis was performed following the standard protocol without IV contrast. COMPARISON:  CT the abdomen and pelvis 08/11/2017. FINDINGS: Lower chest: Mild scarring in the lung bases bilaterally. Hepatobiliary: No definite cystic or solid hepatic lesions are noted on today's noncontrast CT examination. Status post cholecystectomy. Pancreas: No definite pancreatic mass or peripancreatic fluid or inflammatory changes are noted on today's noncontrast CT examination. Spleen: Unremarkable. Adrenals/Urinary Tract: Unenhanced appearance of the kidneys and bilateral adrenal glands is normal. No  hydroureteronephrosis. Urinary bladder is filled with iodinated contrast material (presumably from today's earlier CTA examination). Stomach/Bowel: Unenhanced appearance of the stomach is normal. No pathologic dilatation of small bowel or colon. Normal appendix. Vascular/Lymphatic: Aortic atherosclerosis. No lymphadenopathy noted in the abdomen or pelvis. Reproductive: Unenhanced appearance of the uterus and ovaries is unremarkable. Other: No significant volume of ascites.  No pneumoperitoneum. Musculoskeletal: There are no aggressive appearing lytic or blastic lesions noted in the visualized portions of the skeleton. IMPRESSION: 1. No acute findings are noted in the abdomen or pelvis to account for the patient's symptoms. 2. Normal appendix. 3. Aortic atherosclerosis. Electronically Signed   By: Vinnie Langton [name]   On: 04/24/2018 18:46   Dg Chest 2 View  Result Date: 04/23/2018 CLINICAL DATA:  Chest pressure and pain starting at 5 a.m. EXAM: CHEST - 2 VIEW COMPARISON:  Chest CT 04/23/2018 FINDINGS: Mild enlargement of the cardiopericardial silhouette without edema. Linear subsegmental atelectasis at the left lung base. Atherosclerotic calcification of the aortic arch. Mild dextroconvex lower thoracic scoliosis. No pleural effusion. IMPRESSION: 1. Mild enlargement of the cardiopericardial silhouette, without edema. 2. Mild left basilar subsegmental atelectasis or scarring. 3. Dextroconvex lower thoracic scoliosis. 4.  Aortic Atherosclerosis (ICD10-I70.0). Electronically Signed   By: Van Clines [name]   On: 04/23/2018 07:48   Dg Esophagus  Result Date: 04/01/2018 CLINICAL DATA:  Dysphagia EXAM: ESOPHOGRAM / BARIUM SWALLOW / BARIUM TABLET STUDY TECHNIQUE: Combined double contrast and single contrast examination performed using effervescent crystals, thick barium liquid, and thin barium liquid. The patient was observed with fluoroscopy swallowing a 13 mm barium sulphate tablet. FLUOROSCOPY TIME:   Fluoroscopy Time:  1 minutes 30 second Radiation Exposure Index (if provided by the fluoroscopic device): Number of Acquired Spot Images: 0 COMPARISON:  Esophagram 07/30/2016 FINDINGS: Moderate esophageal dysmotility with mild esophageal dilatation and tertiary contractions. Patient head mild aspiration with coughing during the study. Negative for stricture or mass lesion in the esophagus. Small hiatal hernia. Barium tablet passed readily into the stomach without delay. No reflux was identified with water siphon maneuver. IMPRESSION: Moderate esophageal dysmotility. Small hiatal hernia without reflux or stricture Mild aspiration with coughing Electronically Signed   By: Franchot Gallo [name]   On: 04/01/2018 11:08   Dg Abd Portable 1v  Result Date: 04/26/2018 CLINICAL DATA:  Abdominal pain EXAM: PORTABLE ABDOMEN - 1 VIEW COMPARISON:  04/24/2018 FINDINGS: Contrast material from previous CT is noted throughout the colon. No obstructive changes are noted. No free air is seen. No abnormal mass or abnormal calcifications are noted. IMPRESSION: No acute abnormality noted. Electronically Signed   By: Inez Catalina [name]   On: 04/26/2018 09:48   Ct Angio Chest/abd/pel For Dissection W And/or Wo Contrast  Addendum Date: 04/23/2018   ADDENDUM REPORT: 04/23/2018 08:02 ADDENDUM: There is also 2.4 cm hypoattenuating left thyroid nodule, an incidental finding of potential clinical significance. Consider further evaluation with thyroid ultrasound. If patient is clinically hyperthyroid,  consider nuclear medicine thyroid uptake and scan. Electronically Signed   By: Lajean Manes [name]   On: 04/23/2018 08:02   Result Date: 04/23/2018 CLINICAL DATA:  Chest pressure and pain beginning early this morning. Dyspnea on exertion. Some nausea and vomiting. EXAM: CT ANGIOGRAPHY CHEST, ABDOMEN AND PELVIS TECHNIQUE: Multidetector CT imaging through the chest, abdomen and pelvis was performed using the standard protocol during bolus  administration of intravenous contrast. Multiplanar reconstructed images and MIPs were obtained and reviewed to evaluate the vascular anatomy. CONTRAST:  133mL ISOVUE-370 IOPAMIDOL (ISOVUE-370) INJECTION 76% COMPARISON:  Chest radiograph 01/31/2016. Abdomen and pelvis CT, 08/11/2017. FINDINGS: CTA CHEST FINDINGS Cardiovascular: Heart is mildly enlarged. No pericardial effusion. Mild left coronary artery calcifications. Great vessels normal in caliber. No aortic dissection. No atherosclerosis. Aortic arch branch vessels are widely patent. No evidence of a pulmonary embolism. Mediastinum/Nodes: 2.4 cm hypoattenuating left thyroid nodule. No neck base or axillary masses or enlarged lymph nodes. No mediastinal or hilar masses or adenopathy. Trachea and esophagus are unremarkable. Lungs/Pleura: Mild bilateral dependent reticular and hazy peribronchovascular opacities, most likely atelectasis. Study obtained with low lung volumes/expiration. No convincing pneumonia or pulmonary edema. No lung mass or nodule. No pleural effusion or pneumothorax. Musculoskeletal: No fracture or acute finding. No osteoblastic or osteolytic lesions. Review of the MIP images confirms the above findings. CTA ABDOMEN AND PELVIS FINDINGS VASCULAR Aorta: Aorta is normal in caliber. No dissection. Mild atherosclerosis. No significant stenosis. Celiac: Mild plaque at the origin. No significant stenosis. No dissection, aneurysm or vasculitis. SMA: Mild plaque at the origin. No significant stenosis. No dissection, aneurysm or vasculitis. Renals: Single right and 2 left renal arteries. Mild plaque at the origin of the right renal artery without significant stenosis. No dissection, vasculitis, aneurysm or fibromuscular dysplasia. IMA: Minor plaque at the origin without significant stenosis. Inflow: Bilateral common iliac atherosclerotic plaque without significant stenosis. External iliac arteries are widely patent. Veins: No obvious venous abnormality  within the limitations of this arterial phase study. Review of the MIP images confirms the above findings. NON-VASCULAR Hepatobiliary: Liver normal in overall size. There is central volume loss with some anterior surface nodularity, findings raising the possibility of cirrhosis. No liver mass or focal lesion. Gallbladder surgically absent. Common bile duct dilated to 1 cm proximally with normal distal tapering. This is presumed chronic. Pancreas: Unremarkable. No pancreatic ductal dilatation or surrounding inflammatory changes. Spleen: Normal in size without focal abnormality. Adrenals/Urinary Tract: No adrenal masses. Bilateral renal cortical thinning with reduced renal sizes, right 8.4 cm in length, left 8.8 cm in length. 8 mm low-attenuation lesion in the posterior midpole, left kidney, likely a cyst. No other renal masses or lesions, no stones and no hydronephrosis. Ureters are normal in course and in caliber. Bladder is unremarkable. Stomach/Bowel: Stomach is within normal limits. Appendix appears normal. No evidence of bowel wall thickening, distention, or inflammatory changes. Lymphatic: No adenopathy. Reproductive: Uterus and bilateral adnexa are unremarkable. Other: No abdominal wall hernia or abnormality. No abdominopelvic ascites. Musculoskeletal: No fracture or acute finding. No osteoblastic or osteolytic lesions. There are degenerative changes noted throughout the thoracolumbar spine with an S-shaped scoliosis. Review of the MIP images confirms the above findings. IMPRESSION: CTA FINDINGS 1. No aortic aneurysm or dissection.  No acute findings. 2. Atherosclerotic changes along the abdominal aorta. No significant stenosis. Branch vessels are widely patent. NON CTA FINDINGS 1. No acute findings in the chest, abdomen or pelvis. 2. Mild cardiomegaly.  Mild left coronary artery calcifications. 3. Lungs show mild  dependent opacity that is most likely atelectasis. 4. Morphologic changes of the liver raise the  possibility of cirrhosis. 5. Bilateral renal cortical thinning with read Tues renal sizes consistent with medical renal disease. Electronically Signed: By: Lajean Manes [name] On: 04/23/2018 07:52    Micro Results    No results found for this or any previous visit (from the past 240 hour(s)).     Today   Subjective:   Kerry Barnes today has no headache,no chest pain,no new weakness tingling or numbness, mouth pain  Objective:   Blood pressure (!) 131/53, pulse 63, temperature 98.2 F (36.8 C), temperature source Oral, resp. rate 13, height 5\' 6"  (1.676 m), weight 69.2 kg, SpO2 98 %.   Intake/Output Summary (Last 24 hours) at 04/26/2018 1350 Last data filed at 04/26/2018 0955 Gross per 24 hour  Intake 680 ml  Output -  Net 680 ml    Exam Awake Alert, Oriented x 3, No new F.N deficits, Normal affect, sitting in recliner in no apparent distress Symmetrical Chest wall movement, Good air movement bilaterally, CTAB RRR,No Gallops,Rubs or new Murmurs, No Parasternal Heave +ve B.Sounds, Abd Soft, Non tender, No organomegaly appriciated, No rebound -guarding or rigidity. No Cyanosis, Clubbing or edema, No new Rash or bruise  Data Review   CBC w Diff:  Lab Results  Component Value Date   WBC 7.0 04/26/2018   HGB 12.1 04/26/2018   HCT 38.7 04/26/2018   PLT 237 04/26/2018   LYMPHOPCT 35 04/26/2017   MONOPCT 8 04/26/2017   EOSPCT 3 04/26/2017   BASOPCT 0 04/26/2017    CMP:  Lab Results  Component Value Date   NA 136 04/26/2018   K 4.6 04/26/2018   CL 106 04/26/2018   CO2 24 04/26/2018   BUN 11 04/26/2018   CREATININE 1.48 (H) 04/26/2018   CREATININE 1.32 (H) 08/18/2016   PROT 7.6 04/23/2018   ALBUMIN 3.9 04/23/2018   BILITOT 1.2 04/23/2018   ALKPHOS 88 04/23/2018   AST 32 04/23/2018   ALT 16 04/23/2018  .   Total Time in preparing paper work, data evaluation and todays exam - 30 minutes  Phillips Climes M.D on 04/26/2018 at 1:50 PM  [facility]   Office  [phone]

## 2018-04-28 ENCOUNTER — Other Ambulatory Visit: Payer: Self-pay

## 2018-04-28 NOTE — Patient Outreach (Signed)
[facility]) Care Management  [place]  04/28/2018  Kerry Barnes Apr 20, 1934 [id number]   84 year old female outreached by [place] services for a 30 day post discharge medication review.  PMHx includes, but not limited to, mitral regurgitation, hypertension, pulmonary hypertension, chronic diastolic heart failure, atrial flutter, stroke, asthma, GERD, diverticulosis, irritable bowel, type 2 diabetes mellitus, CKD Stage III, and hyperlipidemia.   Successful telephone call attempt # 1 to [name].  She reports that she has visitors and requests that I call her back another time.    Plan:  Outreach attempt Monday, 11/18.  Joetta Manners, PharmD Clinical Pharmacist [facility] [phone]

## 2018-05-02 ENCOUNTER — Other Ambulatory Visit: Payer: Self-pay

## 2018-05-02 ENCOUNTER — Ambulatory Visit: Payer: Self-pay

## 2018-05-02 NOTE — Patient Outreach (Signed)
[facility]) Care Management  Kerry Barnes   05/02/2018  Kerry Barnes [birth year] [id number]  Reason for referral: 30 day post discharge medication review  Current insurance: [employer]  Successful outreach attempt to Kerry Barnes.  HIPAA identifiers verified.   PMHx:  84 year old female outreached by [place] services for a 30 day post discharge medication review.  PMHx includes, but not limited to, mitral regurgitation, hypertension, pulmonary hypertension, chronic diastolic heart failure, atrial flutter, stroke, asthma, GERD, diverticulosis, irritable bowel, type 2 diabetes mellitus, CKD Stage III, and hyperlipidemia.  Cardioversion on 04/25/18  HPI:  Kerry Barnes reports that she is not feeling well.  She states she is very tired and feels "give out."  She reports that she laid down most of the day yesterday.  She also reports that she is short of breath and that it is worse than yesterday.   She states that she is not in pain.  She has a cardiology appointment on Friday and has not made an appointment with her PCP.     Objective: Lab Results  Component Value Date   CREATININE 1.48 (H) 04/26/2018   CREATININE 1.67 (H) 04/23/2018   CREATININE 1.85 (H) 04/27/2017    Lab Results  Component Value Date   HGBA1C (H) 09/08/2010    6.3 (NOTE)                                                                       According to the ADA Clinical Practice Recommendations for 2011, when HbA1c is used as a screening test:   >=6.5%   Diagnostic of Diabetes Mellitus           (if abnormal result  is confirmed)  5.7-6.4%   Increased risk of developing Diabetes Mellitus  References:Diagnosis and Classification of Diabetes Mellitus,Diabetes MPNT,6144,31(VQMGQ 1):S62-S69 and Standards of Medical Care in         Diabetes - 2011,Diabetes Care,2011,34  (Suppl 1):S11-S61.    Lipid Panel     Component Value Date/Time   CHOL (H) 09/09/2010 0110    206        ATP III  CLASSIFICATION:  <200     mg/dL   Desirable  200-239  mg/dL   Borderline High  >=240    mg/dL   High          TRIG 129 09/09/2010 0110   HDL 51 09/09/2010 0110   CHOLHDL 4.0 09/09/2010 0110   VLDL 26 09/09/2010 0110   LDLCALC (H) 09/09/2010 0110    129        Total Cholesterol/HDL:CHD Risk Coronary Heart Disease Risk Table                     Men   Women  1/2 Average Risk   3.4   3.3  Average Risk       5.0   4.4  2 X Average Risk   9.6   7.1  3 X Average Risk  23.4   11.0        Use the calculated Patient Ratio above and the CHD Risk Table to determine the patient's CHD Risk.        ATP III CLASSIFICATION (LDL):  <  100     mg/dL   Optimal  100-129  mg/dL   Near or Above                    Optimal  130-159  mg/dL   Borderline  160-189  mg/dL   High  >190     mg/dL   Very High    BP Readings from Last 3 Encounters:  04/26/18 (!) 131/53  03/18/18 (!) 150/60  04/27/17 (!) 113/48    Allergies  Allergen Reactions  . Codeine Nausea Only  . Metformin Diarrhea  . Amoxicillin Rash  . Biaxin [Clarithromycin] Rash  . Ciprofloxacin Rash  . Penicillins Rash    Childhood allergic reaction Has patient had a PCN reaction causing immediate rash, facial/tongue/throat swelling, SOB or lightheadedness with hypotension: Yes Has patient had a PCN reaction causing severe rash involving mucus membranes or skin necrosis: No Has patient had a PCN reaction that required hospitalization: Yes Has patient had a PCN reaction occurring within the last 10 years: No If all of the above answers are "NO", then may proceed with Cephalosporin use.    Medications Reviewed Today    Reviewed by [name], MD (Physician) on 04/25/18 at 1250  Med List Status: Complete  Medication Order Taking? Sig Documenting Provider Last Dose Status Informant  acetaminophen (TYLENOL) 325 MG tablet [id number] Yes Take 2 tablets (650 mg total) by mouth every 4 (four) hours as needed for headache or mild pain. Erlene Quan, PA-C 04/21/2018 Active Self  albuterol (PROVENTIL HFA;VENTOLIN HFA) 108 (90 BASE) MCG/ACT inhaler [id number] Yes Inhale into the lungs every 6 (six) hours as needed for wheezing or shortness of breath. [provider] 04/22/2018 Unknown time Active Self  ALPRAZolam (XANAX) 0.5 MG tablet [id number] Yes Take 0.5 mg by mouth 2 (two) times daily as needed for anxiety. [provider] 04/22/2018 Unknown time Active Self           Med Note Rosine Beat Apr 26, 2017  5:24 PM) #60 filled 04/01/17 [facility] per external pharmacy records  amiodarone (PACERONE) 200 MG tablet [id number] Yes Take 1 tablet (200 mg total) by mouth daily. Wellington Hampshire, MD 04/22/2018 am Active Self  apixaban (ELIQUIS) 2.5 MG TABS tablet [id number] Yes Take 1 tablet by mouth twice daily, need to schedule MD appt for further refills Wellington Hampshire, MD 04/22/2018 2100 Active Self  atorvastatin (LIPITOR) 40 MG tablet [id number] Yes Take 40 mg by mouth daily. [provider] 04/22/2018 Unknown time Active Self  cetirizine (ZYRTEC) 10 MG tablet [id number] Yes Take 10 mg by mouth daily as needed for allergies.  [provider] 04/21/2018 Active Self  diltiazem (CARDIZEM SR) 60 MG 12 hr capsule [id number] Yes Take 60 mg daily by mouth. [provider] 04/22/2018 Unknown time Active Self           Med Note Orvan Seen, HEATHER L   Mon Apr 26, 2017  5:15 PM) Per pharmacy records pt has been taking 60 mg since at least 02/12/17  doxycycline (VIBRAMYCIN) 100 MG capsule [id number] Yes Take 100 mg by mouth 2 (two) times daily. Started on 04-18-18  DS 10 [provider] 04/22/2018 Unknown time Active Self  EPINEPHrine (EPI-PEN) 0.3 mg/0.3 mL SOAJ injection [id number] Yes Inject 0.3 mg once as needed into the muscle (severe allergic reaction).  [provider] unk prn Active Self  Fluticasone-Salmeterol (ADVAIR) 100-50 MCG/DOSE AEPB [id number] Yes Inhale 1 puff into the  lungs every 12  (twelve) hours. [provider] 04/22/2018 Unknown time Active Self           Med Note Orvan Seen, HEATHER L   Mon Apr 26, 2017  7:40 PM) Last filled 08/19/16  ibandronate (BONIVA) 150 MG tablet [id number] Yes Take 150 mg by mouth every 30 (thirty) days. [provider] 04/15/2018 Active Self  lubiprostone (AMITIZA) 24 MCG capsule [id number] Yes Take 1 capsule (24 mcg total) by mouth 2 (two) times daily with a meal. Pyrtle, Lajuan Lines, MD 04/22/2018 Unknown time Active Self  meclizine (ANTIVERT) 25 MG tablet [id number] Yes Take 25 mg by mouth 3 (three) times daily as needed for dizziness.  [provider] unk prn Active Self  methocarbamol (ROBAXIN) 500 MG tablet [id number] Yes Take 1 tablet (500 mg total) every 6 (six) hours as needed by mouth for muscle spasms. Geradine Girt, DO 04/22/2018 Unknown time Active Self  mometasone (NASONEX) 50 MCG/ACT nasal spray [id number] Yes Place 2 sprays into the nose daily as needed (nasal congestion).  [provider] unk prn Active Self  montelukast (SINGULAIR) 10 MG tablet [id number] Yes Take 10 mg at bedtime by mouth.  [provider] 04/22/2018 Unknown time Active Self           Med Note (ATKINS, HEATHER L   Mon Apr 26, 2017  7:54 PM)    olopatadine (PATANOL) 0.1 % ophthalmic solution [id number] Yes Place 1 drop into both eyes 2 (two) times daily.  [provider] 04/22/2018 Unknown time Active Self  pantoprazole (PROTONIX) 40 MG tablet [id number] No Take 1 tablet (40 mg total) by mouth 2 (two) times daily. Take before breakfast and dinner  Patient not taking:  Reported on 04/23/2018   Levin Erp, PA Not Taking Unknown time Consider Medication Status and Discontinue Self  pregabalin (LYRICA) 75 MG capsule [id number] Yes Take 75 mg by mouth 2 (two) times daily. [provider] 04/22/2018 Unknown time Active Self           Med Note Orvan Seen, Tally Joe Apr 26, 2017  5:26 PM) #60 filled 04/13/17 [facility] per  external pharmacy records  Propylene Glycol (SYSTANE BALANCE OP) [id number] Yes Place 1 drop daily as needed into both eyes (dryness).  [provider] Past Week Unknown time Active Self  ranitidine (ZANTAC) 150 MG capsule [id number] Yes TAKE 1 CAPSULE(150 MG) BY MOUTH EVERY EVENING  Patient taking differently:  Take 150 mg by mouth daily.    Jerene Bears, MD 04/22/2018 Unknown time Active Self  solifenacin (VESICARE) 5 MG tablet [id number] Yes Take 5 mg by mouth daily. [provider] 04/22/2018 Unknown time Active Self  traMADol (ULTRAM) 50 MG tablet [id number] Yes Take 50 mg every 12 (twelve) hours as needed by mouth (pain).  [provider] unk prn Active Self           Med Note Rosine Beat Apr 26, 2017  5:24 PM) #60 filled 03/18/17 [facility] per external pharmacy records         ASSESSMENT: Date Discharged from Hospital: 04/26/18 Date Medication Reconciliation Performed: 05/02/2018  Medications Discontinued at Discharge:   acetaminophen  Doxycycline  Ranitidine  Tramadol   Medications New at Discharge: . Sucralfate  Medications with Dose Adjustments at Discharge: . Diltiazem  Patient was recently discharged from hospital and all medications have been reviewed.  Drugs sorted by system:  Neurologic/Psychologic: alprazolam  Cardiovascular: amiodarone, apixaban, atorvastatin, diltiazem,  epinephrine,  pregabalin   Pulmonary/Allergy: albuterol MDI, certirizine, mometasone, montelukast,   Gastrointestinal: docusate sodium, pantoprazole, polyethylene glycol, sucralfate, lubiprostone  Topical: propylene glycol  Pain: methocarbamol  Genitourinary: solifenacin  Miscellaneous: ibandronate, meclizine   Medication Review Findings:  . Patient reports that she has not gotten her diltiazem 120 mg filled since discharge.  She had been finishing off her diltiazem 60 mg capsules.  She states that she will have her husband get it filled today.     Call placed to her cardiologist, Dr. Bonney Roussel office to see if he wants to see the patient earlier than Friday given her fatigue and shortness of breath.  Nurse states will follow up with patient after she speaks to the physician.  Patient also needs to schedule a PCP appointment post discharge.   Plan: Route note to PCP, Percival Spanish, PA-C.  Joetta Manners, PharmD Clinical Pharmacist [place] [phone]  PLAN:

## 2018-05-03 ENCOUNTER — Other Ambulatory Visit: Payer: Self-pay

## 2018-05-03 ENCOUNTER — Ambulatory Visit: Payer: PPO

## 2018-05-03 DIAGNOSIS — R001 Bradycardia, unspecified: Secondary | ICD-10-CM | POA: Diagnosis not present

## 2018-05-03 DIAGNOSIS — I10 Essential (primary) hypertension: Secondary | ICD-10-CM | POA: Diagnosis not present

## 2018-05-03 DIAGNOSIS — Z9889 Other specified postprocedural states: Secondary | ICD-10-CM | POA: Diagnosis not present

## 2018-05-03 DIAGNOSIS — I4892 Unspecified atrial flutter: Secondary | ICD-10-CM | POA: Diagnosis not present

## 2018-05-03 NOTE — Patient Outreach (Signed)
[facility]) Care Management  05/03/2018  Kerry Barnes 1934/03/07 [id number]  44year oldfemaleoutreached by [place] services for a 30 day post discharge medication review. PMHx includes, but not limited to, mitral regurgitation, hypertension, pulmonary hypertension, chronic diastolic heart failure, atrial flutter, stroke, asthma, GERD, diverticulosis, irritable bowel, type 2 diabetes mellitus, CKD Stage III, and hyperlipidemia.  Cardioversion on 04/25/18.  Successful outreach to Kerry Barnes.  HIPAA identifiers verified.   Kerry Barnes states that she has a cardiologist's appointment  today at 10 am.  She reports that she feels a little better today, but states she has been using her albuterol for SOB.   Prior to her cardioversion, she states she could "hear my heartbeat," but she denies experiencing that now.   Incoming call received from Kerry Barnes.  She states that her cardiologist told her that her heart was beating slow and he discontinued her diltiazem.  She has a follow up appointment in one week.   Plan: Route note to PCP, Bing Matter,  PA-C  Joetta Manners, [place] [phone]

## 2018-05-04 ENCOUNTER — Encounter (INDEPENDENT_AMBULATORY_CARE_PROVIDER_SITE_OTHER): Payer: PPO | Admitting: Ophthalmology

## 2018-05-05 ENCOUNTER — Emergency Department (HOSPITAL_COMMUNITY)
Admission: EM | Admit: 2018-05-05 | Discharge: 2018-05-05 | Disposition: A | Discharge status: Home / Self Care | Payer: PPO | Attending: Emergency Medicine | Admitting: Emergency Medicine

## 2018-05-05 ENCOUNTER — Encounter (HOSPITAL_COMMUNITY): Payer: Self-pay | Admitting: Emergency Medicine

## 2018-05-05 ENCOUNTER — Encounter: Payer: Self-pay | Admitting: Physical Therapy

## 2018-05-05 ENCOUNTER — Other Ambulatory Visit: Payer: Self-pay

## 2018-05-05 ENCOUNTER — Ambulatory Visit: Payer: PPO | Attending: Internal Medicine | Admitting: Physical Therapy

## 2018-05-05 VITALS — BP 160/52 | HR 55

## 2018-05-05 DIAGNOSIS — M25571 Pain in right ankle and joints of right foot: Secondary | ICD-10-CM | POA: Diagnosis present

## 2018-05-05 DIAGNOSIS — J45901 Unspecified asthma with (acute) exacerbation: Secondary | ICD-10-CM | POA: Insufficient documentation

## 2018-05-05 DIAGNOSIS — Z79899 Other long term (current) drug therapy: Secondary | ICD-10-CM | POA: Insufficient documentation

## 2018-05-05 DIAGNOSIS — I130 Hypertensive heart and chronic kidney disease with heart failure and stage 1 through stage 4 chronic kidney disease, or unspecified chronic kidney disease: Secondary | ICD-10-CM | POA: Diagnosis not present

## 2018-05-05 DIAGNOSIS — R296 Repeated falls: Secondary | ICD-10-CM | POA: Diagnosis present

## 2018-05-05 DIAGNOSIS — R531 Weakness: Secondary | ICD-10-CM | POA: Insufficient documentation

## 2018-05-05 DIAGNOSIS — N289 Disorder of kidney and ureter, unspecified: Secondary | ICD-10-CM | POA: Diagnosis not present

## 2018-05-05 DIAGNOSIS — N189 Chronic kidney disease, unspecified: Secondary | ICD-10-CM | POA: Diagnosis not present

## 2018-05-05 DIAGNOSIS — I5032 Chronic diastolic (congestive) heart failure: Secondary | ICD-10-CM | POA: Insufficient documentation

## 2018-05-05 LAB — CBC WITH DIFFERENTIAL/PLATELET
Abs Immature Granulocytes: 0.03 10*3/uL (ref 0.00–0.07)
Basophils Absolute: 0 10*3/uL (ref 0.0–0.1)
Basophils Relative: 0 %
Eosinophils Absolute: 0.1 10*3/uL (ref 0.0–0.5)
Eosinophils Relative: 1 %
HCT: 43.4 % (ref 36.0–46.0)
Hemoglobin: 13.3 g/dL (ref 12.0–15.0)
Immature Granulocytes: 0 %
Lymphocytes Relative: 25 %
Lymphs Abs: 1.9 10*3/uL (ref 0.7–4.0)
MCH: 26.5 pg (ref 26.0–34.0)
MCHC: 30.6 g/dL (ref 30.0–36.0)
MCV: 86.5 fL (ref 80.0–100.0)
Monocytes Absolute: 0.5 10*3/uL (ref 0.1–1.0)
Monocytes Relative: 7 %
Neutro Abs: 4.9 10*3/uL (ref 1.7–7.7)
Neutrophils Relative %: 67 %
Platelets: 275 10*3/uL (ref 150–400)
RBC: 5.02 MIL/uL (ref 3.87–5.11)
RDW: 14.9 % (ref 11.5–15.5)
WBC: 7.4 10*3/uL (ref 4.0–10.5)
nRBC: 0 % (ref 0.0–0.2)

## 2018-05-05 LAB — I-STAT TROPONIN, ED: Troponin i, poc: 0.03 ng/mL (ref 0.00–0.08)

## 2018-05-05 LAB — COMPREHENSIVE METABOLIC PANEL
ALT: 12 U/L (ref 0–44)
AST: 23 U/L (ref 15–41)
Albumin: 3.7 g/dL (ref 3.5–5.0)
Alkaline Phosphatase: 89 U/L (ref 38–126)
Anion gap: 10 (ref 5–15)
BUN: 14 mg/dL (ref 8–23)
CO2: 21 mmol/L — ABNORMAL LOW (ref 22–32)
Calcium: 9.2 mg/dL (ref 8.9–10.3)
Chloride: 106 mmol/L (ref 98–111)
Creatinine, Ser: 1.37 mg/dL — ABNORMAL HIGH (ref 0.44–1.00)
GFR calc Af Amer: 40 mL/min — ABNORMAL LOW (ref >60)
GFR calc non Af Amer: 35 mL/min — ABNORMAL LOW (ref >60)
Glucose, Bld: 88 mg/dL (ref 70–99)
Potassium: 4 mmol/L (ref 3.5–5.1)
Sodium: 137 mmol/L (ref 135–145)
Total Bilirubin: 0.9 mg/dL (ref 0.3–1.2)
Total Protein: 7.2 g/dL (ref 6.5–8.1)

## 2018-05-05 MED ORDER — ALPRAZOLAM 0.25 MG PO TABS
0.2500 mg | ORAL_TABLET | Freq: Once | ORAL | Status: AC
  Administered 2018-05-05: 0.25 mg via ORAL
  Filled 2018-05-05: qty 1

## 2018-05-05 NOTE — ED Provider Notes (Signed)
[facility] EMERGENCY DEPARTMENT Provider Note   CSN: [id number] Arrival date & time: 05/05/18  9323     History   Chief Complaint Chief Complaint  Patient presents with  . Hypertension    HPI Kerry Barnes is a 84 y.o. female.  HPI Complains of generalized weakness and "feeling bad" 2 AM today upon awakening.  She denies any chest pain or chest discomfort denies shortness of breath she complains of vague abdominal discomfort but is presently hungry.  Her last bowel movement was yesterday, normal.  Nothing makes symptoms better or worse.  No other associated symptoms.  She went to physical therapy earlier today and was sent here for further evaluation. Past Medical History:  Diagnosis Date  . Anxiety   . Arthritis   . Asthma   . Atypical atrial flutter ([place])   . Cataract   . Chronic diastolic CHF (congestive heart failure) ([place])   . Fibromyalgia   . GERD (gastroesophageal reflux disease)   . History of blood transfusion   . Hyperlipemia   . Hypertension   . IBS (irritable bowel syndrome)   . Seasonal allergies   . Stroke [facility]) 2013   TIA  . Tubular adenoma of colon   . Type 2 diabetes mellitus [facility])     Patient Active Problem List   Diagnosis Date Noted  . Chest pressure 04/23/2018  . Abdominal pain 04/23/2018  . Atypical chest pain   . Epigastric abdominal pain   . AKI (acute kidney injury) ([place]) 04/26/2017  . Chronic renal insufficiency, stage III (moderate) ([place]) 04/17/2016  . Chronic anticoagulation 02/05/2016  . Chest pain   . Acute kidney injury superimposed on CKD ([place])   . Atrial flutter with rapid ventricular response ([place]) 01/31/2016  . Asthma with acute exacerbation 09/10/2015  . Difficulty swallowing 09/10/2015  . History of acute bronchitis 09/10/2015  . Moderate persistent asthma 09/10/2015  . Sore throat 09/10/2015  . Left carotid bruit 04/23/2015  . Leg pain 10/10/2013  . Stroke ([place]) 06/16/2011  . Chronic diastolic  heart failure ([place]) 11/06/2009  . PULMONARY HYPERTENSION, SECONDARY 07/04/2009  . ARM NUMBNESS 07/04/2009  . Type 2 diabetes with stage 3 chronic kidney disease GFR 30-59 ([place]) 07/03/2009  . Hyperlipidemia 07/03/2009  . MITRAL REGURGITATION, MILD 07/03/2009  . Essential hypertension 07/03/2009  . Gastroparesis 07/03/2009  . DIVERTICULOSIS, COLON, WITH HEMORRHAGE 07/03/2009  . CONSTIPATION 07/03/2009  . IBS 07/03/2009  . ARTHRITIS 07/03/2009  . MIGRAINES, HX OF 07/03/2009  . History of disease 07/03/2009  . EUSTACHIAN TUBE DYSFUNCTION 04/04/2007  . ALLERGIC RHINITIS 04/04/2007  . ASTHMA 04/04/2007  . ESOPHAGITIS, REFLUX 04/04/2007  . Asthma 04/04/2007    Past Surgical History:  Procedure Laterality Date  . ANKLE FRACTURE SURGERY    . CARDIOVERSION N/A 02/04/2016   Procedure: CARDIOVERSION;  Surgeon: Sueanne Margarita, MD;  Location: [facility] ENDOSCOPY;  Service: Cardiovascular;  Laterality: N/A;  . CARDIOVERSION N/A 05/01/2016   Procedure: CARDIOVERSION;  Surgeon: Fay Records, MD;  Location: [facility] ENDOSCOPY;  Service: Cardiovascular;  Laterality: N/A;  . CARDIOVERSION N/A 04/25/2018   Procedure: CARDIOVERSION;  Surgeon: [name], MD;  Location: [facility];  Service: Cardiovascular;  Laterality: N/A;  . CHOLECYSTECTOMY    . TEE WITHOUT CARDIOVERSION N/A 02/04/2016   Procedure: TRANSESOPHAGEAL ECHOCARDIOGRAM (TEE);  Surgeon: Sueanne Margarita, MD;  Location: [facility] ENDOSCOPY;  Service: Cardiovascular;  Laterality: N/A;  . TUBAL LIGATION    . WRIST FRACTURE SURGERY  OB History   None      Home Medications    Prior to Admission medications   Medication Sig Start Date End Date Taking? Authorizing Provider  albuterol (PROVENTIL HFA;VENTOLIN HFA) 108 (90 BASE) MCG/ACT inhaler Inhale into the lungs every 6 (six) hours as needed for wheezing or shortness of breath.    [provider]  ALPRAZolam Duanne Moron) 0.5 MG tablet Take 0.5 mg by mouth 2 (two) times daily as needed for anxiety.     [provider]  amiodarone (PACERONE) 200 MG tablet Take 1 tablet (200 mg total) by mouth 2 (two) times daily. 04/26/18   Elgergawy, Silver Huguenin, MD  apixaban (ELIQUIS) 2.5 MG TABS tablet Take 1 tablet by mouth twice daily, need to schedule MD appt for further refills 07/22/17   Wellington Hampshire, MD  atorvastatin (LIPITOR) 40 MG tablet Take 40 mg by mouth daily.    [provider]  cetirizine (ZYRTEC) 10 MG tablet Take 10 mg by mouth daily as needed for allergies.     [provider]  docusate sodium (COLACE) 100 MG capsule Take 1 capsule (100 mg total) by mouth 2 (two) times daily. 04/25/18 04/25/19  Dana Allan I, MD  EPINEPHrine (EPI-PEN) 0.3 mg/0.3 mL SOAJ injection Inject 0.3 mg once as needed into the muscle (severe allergic reaction).     [provider]  ibandronate (BONIVA) 150 MG tablet Take 150 mg by mouth every 30 (thirty) days. 02/28/18   [provider]  lubiprostone (AMITIZA) 24 MCG capsule Take 1 capsule (24 mcg total) by mouth 2 (two) times daily with a meal. 12/01/17   Pyrtle, Lajuan Lines, MD  meclizine (ANTIVERT) 25 MG tablet Take 25 mg by mouth 3 (three) times daily as needed for dizziness.     [provider]  methocarbamol (ROBAXIN) 500 MG tablet Take 1 tablet (500 mg total) every 6 (six) hours as needed by mouth for muscle spasms. 04/27/17   Geradine Girt, DO  mometasone (NASONEX) 50 MCG/ACT nasal spray Place 2 sprays into the nose daily as needed (nasal congestion).     [provider]  montelukast (SINGULAIR) 10 MG tablet Take 10 mg at bedtime by mouth.  04/14/16   [provider]  olopatadine (PATANOL) 0.1 % ophthalmic solution Place 1 drop into both eyes 2 (two) times daily.     [provider]  pantoprazole (PROTONIX) 40 MG tablet Take 1 tablet (40 mg total) by mouth 2 (two) times daily. Take before breakfast and dinner 03/18/18   Levin Erp, PA  polyethylene glycol [facility]) packet  Take 17 g by mouth daily. 04/25/18   Bonnell Public, MD  pregabalin (LYRICA) 75 MG capsule Take 75 mg by mouth 2 (two) times daily.    [provider]  Propylene Glycol (SYSTANE BALANCE OP) Place 1 drop daily as needed into both eyes (dryness).     [provider]  solifenacin (VESICARE) 5 MG tablet Take 5 mg by mouth daily. 09/02/17   [provider]  sucralfate (CARAFATE) 1 GM/10ML suspension Take 10 mLs (1 g total) by mouth 4 (four) times daily -  with meals and at bedtime. 04/25/18   Bonnell Public, MD    Family History Family History  Problem Relation Age of Onset  . Diabetes Mother   . Diabetes Father   . Diabetes Sister   . Diabetes Brother   . Colon cancer Neg Hx   . Stomach cancer Neg Hx  Social History Social History   Tobacco Use  . Smoking status: Never Smoker  . Smokeless tobacco: Never Used  Substance Use Topics  . Alcohol use: No    Alcohol/week: 0.0 standard drinks  . Drug use: No     Allergies   Codeine; Metformin; Amoxicillin; Biaxin [clarithromycin]; Ciprofloxacin; and Penicillins   Review of Systems Review of Systems  Constitutional:       Generalized malaise  Gastrointestinal:       Vague diffuse abdominal discomfort  Musculoskeletal: Positive for gait problem.       Walks with cane or walker  Neurological: Positive for weakness.       Generalized weakness  Psychiatric/Behavioral: The patient is nervous/anxious.   All other systems reviewed and are negative.    Physical Exam Updated Vital Signs BP (!) 169/66   Pulse (!) 53   Temp 97.8 F (36.6 C) (Oral)   Resp (!) 21   Ht 5\' 1"  (1.549 m)   Wt 62.1 kg   SpO2 100%   BMI 25.89 kg/m   Physical Exam  Constitutional: She is oriented to person, place, and time. She appears well-developed and well-nourished.  Anxious appearing  HENT:  Head: Normocephalic and atraumatic.  Eyes: Pupils are equal, round, and reactive to light. Conjunctivae are normal.   Neck: Neck supple. No tracheal deviation present. No thyromegaly present.  Cardiovascular: Regular rhythm.  No murmur heard. Mildly bradycardic  Pulmonary/Chest: Effort normal and breath sounds normal.  Abdominal: Soft. Bowel sounds are normal. She exhibits no distension. There is no tenderness.  Musculoskeletal: Normal range of motion. She exhibits no edema or tenderness.  Neurological: She is alert and oriented to person, place, and time. Coordination normal.  Walks with cane without difficulty  Skin: Skin is warm and dry. No rash noted.  Psychiatric: She has a normal mood and affect.  Nursing note and vitals reviewed.    ED Treatments / Results  Labs (all labs ordered are listed, but only abnormal results are displayed) Labs Reviewed  COMPREHENSIVE METABOLIC PANEL  CBC WITH DIFFERENTIAL/PLATELET  I-STAT TROPONIN, ED    EKG EKG Interpretation  Date/Time:  Thursday May 05 2018 09:41:39 EST Ventricular Rate:  55 PR Interval:    QRS Duration: 96 QT Interval:  481 QTC Calculation: 461 R Axis:   73 Text Interpretation:  Normal sinus rhythm Abnormal R-wave progression, early transition Nonspecific repol abnormality, lateral leads No significant change since last tracing Confirmed by Orlie Dakin [phone]) on 05/05/2018 9:49:34 AM   Radiology No results found.  Procedures Procedures (including critical care time)  Medications Ordered in ED Medications - No data to display 1 PM patient asymptomatic and resting comfortably after treatment with Xanax.  She is alert talkative and in no distress. Results for orders placed or performed during the hospital encounter of 05/05/18  Comprehensive metabolic panel  Result Value Ref Range   Sodium 137 135 - 145 mmol/L   Potassium 4.0 3.5 - 5.1 mmol/L   Chloride 106 98 - 111 mmol/L   CO2 21 (L) 22 - 32 mmol/L   Glucose, Bld 88 70 - 99 mg/dL   BUN 14 8 - 23 mg/dL   Creatinine, Ser 1.37 (H) 0.44 - 1.00 mg/dL   Calcium 9.2  8.9 - 10.3 mg/dL   Total Protein 7.2 6.5 - 8.1 g/dL   Albumin 3.7 3.5 - 5.0 g/dL   AST 23 15 - 41 U/L   ALT 12 0 - 44 U/L   Alkaline Phosphatase  89 38 - 126 U/L   Total Bilirubin 0.9 0.3 - 1.2 mg/dL   GFR calc non Af Amer 35 (L) >60 mL/min   GFR calc Af Amer 40 (L) >60 mL/min   Anion gap 10 5 - 15  CBC with Differential/Platelet  Result Value Ref Range   WBC 7.4 4.0 - 10.5 K/uL   RBC 5.02 3.87 - 5.11 MIL/uL   Hemoglobin 13.3 12.0 - 15.0 g/dL   HCT 43.4 36.0 - 46.0 %   MCV 86.5 80.0 - 100.0 fL   MCH 26.5 26.0 - 34.0 pg   MCHC 30.6 30.0 - 36.0 g/dL   RDW 14.9 11.5 - 15.5 %   Platelets 275 150 - 400 K/uL   nRBC 0.0 0.0 - 0.2 %   Neutrophils Relative % 67 %   Neutro Abs 4.9 1.7 - 7.7 K/uL   Lymphocytes Relative 25 %   Lymphs Abs 1.9 0.7 - 4.0 K/uL   Monocytes Relative 7 %   Monocytes Absolute 0.5 0.1 - 1.0 K/uL   Eosinophils Relative 1 %   Eosinophils Absolute 0.1 0.0 - 0.5 K/uL   Basophils Relative 0 %   Basophils Absolute 0.0 0.0 - 0.1 K/uL   Immature Granulocytes 0 %   Abs Immature Granulocytes 0.03 0.00 - 0.07 K/uL  I-stat troponin, ED  Result Value Ref Range   Troponin i, poc 0.03 0.00 - 0.08 ng/mL   Comment 3           Ct Abdomen Pelvis Wo Contrast  Result Date: 04/24/2018 CLINICAL DATA:  84 year old female with history of diabetes and chronic kidney disease. Pulmonary hypertension. History of chest and epigastric abdominal pain. EXAM: CT ABDOMEN AND PELVIS WITHOUT CONTRAST TECHNIQUE: Multidetector CT imaging of the abdomen and pelvis was performed following the standard protocol without IV contrast. COMPARISON:  CT the abdomen and pelvis 08/11/2017. FINDINGS: Lower chest: Mild scarring in the lung bases bilaterally. Hepatobiliary: No definite cystic or solid hepatic lesions are noted on today's noncontrast CT examination. Status post cholecystectomy. Pancreas: No definite pancreatic mass or peripancreatic fluid or inflammatory changes are noted on today's noncontrast CT  examination. Spleen: Unremarkable. Adrenals/Urinary Tract: Unenhanced appearance of the kidneys and bilateral adrenal glands is normal. No hydroureteronephrosis. Urinary bladder is filled with iodinated contrast material (presumably from today's earlier CTA examination). Stomach/Bowel: Unenhanced appearance of the stomach is normal. No pathologic dilatation of small bowel or colon. Normal appendix. Vascular/Lymphatic: Aortic atherosclerosis. No lymphadenopathy noted in the abdomen or pelvis. Reproductive: Unenhanced appearance of the uterus and ovaries is unremarkable. Other: No significant volume of ascites.  No pneumoperitoneum. Musculoskeletal: There are no aggressive appearing lytic or blastic lesions noted in the visualized portions of the skeleton. IMPRESSION: 1. No acute findings are noted in the abdomen or pelvis to account for the patient's symptoms. 2. Normal appendix. 3. Aortic atherosclerosis. Electronically Signed   By: Vinnie Langton [name]   On: 04/24/2018 18:46   Dg Chest 2 View  Result Date: 04/23/2018 CLINICAL DATA:  Chest pressure and pain starting at 5 a.m. EXAM: CHEST - 2 VIEW COMPARISON:  Chest CT 04/23/2018 FINDINGS: Mild enlargement of the cardiopericardial silhouette without edema. Linear subsegmental atelectasis at the left lung base. Atherosclerotic calcification of the aortic arch. Mild dextroconvex lower thoracic scoliosis. No pleural effusion. IMPRESSION: 1. Mild enlargement of the cardiopericardial silhouette, without edema. 2. Mild left basilar subsegmental atelectasis or scarring. 3. Dextroconvex lower thoracic scoliosis. 4.  Aortic Atherosclerosis (ICD10-I70.0). Electronically Signed   By: Thayer Jew  Janeece Fitting [name]   On: 04/23/2018 07:48   Dg Abd Portable 1v  Result Date: 04/26/2018 CLINICAL DATA:  Abdominal pain EXAM: PORTABLE ABDOMEN - 1 VIEW COMPARISON:  04/24/2018 FINDINGS: Contrast material from previous CT is noted throughout the colon. No obstructive changes are noted.  No free air is seen. No abnormal mass or abnormal calcifications are noted. IMPRESSION: No acute abnormality noted. Electronically Signed   By: Inez Catalina [name]   On: 04/26/2018 09:48   Ct Angio Chest/abd/pel For Dissection W And/or Wo Contrast  Addendum Date: 04/23/2018   ADDENDUM REPORT: 04/23/2018 08:02 ADDENDUM: There is also 2.4 cm hypoattenuating left thyroid nodule, an incidental finding of potential clinical significance. Consider further evaluation with thyroid ultrasound. If patient is clinically hyperthyroid, consider nuclear medicine thyroid uptake and scan. Electronically Signed   By: Lajean Manes [name]   On: 04/23/2018 08:02   Result Date: 04/23/2018 CLINICAL DATA:  Chest pressure and pain beginning early this morning. Dyspnea on exertion. Some nausea and vomiting. EXAM: CT ANGIOGRAPHY CHEST, ABDOMEN AND PELVIS TECHNIQUE: Multidetector CT imaging through the chest, abdomen and pelvis was performed using the standard protocol during bolus administration of intravenous contrast. Multiplanar reconstructed images and MIPs were obtained and reviewed to evaluate the vascular anatomy. CONTRAST:  115mL ISOVUE-370 IOPAMIDOL (ISOVUE-370) INJECTION 76% COMPARISON:  Chest radiograph 01/31/2016. Abdomen and pelvis CT, 08/11/2017. FINDINGS: CTA CHEST FINDINGS Cardiovascular: Heart is mildly enlarged. No pericardial effusion. Mild left coronary artery calcifications. Great vessels normal in caliber. No aortic dissection. No atherosclerosis. Aortic arch branch vessels are widely patent. No evidence of a pulmonary embolism. Mediastinum/Nodes: 2.4 cm hypoattenuating left thyroid nodule. No neck base or axillary masses or enlarged lymph nodes. No mediastinal or hilar masses or adenopathy. Trachea and esophagus are unremarkable. Lungs/Pleura: Mild bilateral dependent reticular and hazy peribronchovascular opacities, most likely atelectasis. Study obtained with low lung volumes/expiration. No convincing pneumonia or  pulmonary edema. No lung mass or nodule. No pleural effusion or pneumothorax. Musculoskeletal: No fracture or acute finding. No osteoblastic or osteolytic lesions. Review of the MIP images confirms the above findings. CTA ABDOMEN AND PELVIS FINDINGS VASCULAR Aorta: Aorta is normal in caliber. No dissection. Mild atherosclerosis. No significant stenosis. Celiac: Mild plaque at the origin. No significant stenosis. No dissection, aneurysm or vasculitis. SMA: Mild plaque at the origin. No significant stenosis. No dissection, aneurysm or vasculitis. Renals: Single right and 2 left renal arteries. Mild plaque at the origin of the right renal artery without significant stenosis. No dissection, vasculitis, aneurysm or fibromuscular dysplasia. IMA: Minor plaque at the origin without significant stenosis. Inflow: Bilateral common iliac atherosclerotic plaque without significant stenosis. External iliac arteries are widely patent. Veins: No obvious venous abnormality within the limitations of this arterial phase study. Review of the MIP images confirms the above findings. NON-VASCULAR Hepatobiliary: Liver normal in overall size. There is central volume loss with some anterior surface nodularity, findings raising the possibility of cirrhosis. No liver mass or focal lesion. Gallbladder surgically absent. Common bile duct dilated to 1 cm proximally with normal distal tapering. This is presumed chronic. Pancreas: Unremarkable. No pancreatic ductal dilatation or surrounding inflammatory changes. Spleen: Normal in size without focal abnormality. Adrenals/Urinary Tract: No adrenal masses. Bilateral renal cortical thinning with reduced renal sizes, right 8.4 cm in length, left 8.8 cm in length. 8 mm low-attenuation lesion in the posterior midpole, left kidney, likely a cyst. No other renal masses or lesions, no stones and no hydronephrosis. Ureters are normal in course and in caliber. Bladder  is unremarkable. Stomach/Bowel: Stomach is  within normal limits. Appendix appears normal. No evidence of bowel wall thickening, distention, or inflammatory changes. Lymphatic: No adenopathy. Reproductive: Uterus and bilateral adnexa are unremarkable. Other: No abdominal wall hernia or abnormality. No abdominopelvic ascites. Musculoskeletal: No fracture or acute finding. No osteoblastic or osteolytic lesions. There are degenerative changes noted throughout the thoracolumbar spine with an S-shaped scoliosis. Review of the MIP images confirms the above findings. IMPRESSION: CTA FINDINGS 1. No aortic aneurysm or dissection.  No acute findings. 2. Atherosclerotic changes along the abdominal aorta. No significant stenosis. Branch vessels are widely patent. NON CTA FINDINGS 1. No acute findings in the chest, abdomen or pelvis. 2. Mild cardiomegaly.  Mild left coronary artery calcifications. 3. Lungs show mild dependent opacity that is most likely atelectasis. 4. Morphologic changes of the liver raise the possibility of cirrhosis. 5. Bilateral renal cortical thinning with read Tues renal sizes consistent with medical renal disease. Electronically Signed: By: Lajean Manes [name] On: 04/23/2018 07:52   Initial Impression / Assessment and Plan / ED Course  I have reviewed the triage vital signs and the nursing notes.  Pertinent labs & imaging results that were available during my care of the patient were reviewed by me and considered in my medical decision making (see chart for details).    Feel the patient's symptoms were largely due to anxiety.  She is in agreement.  Plan follow-up with PMD.  No prescriptions written. Lab work remarkable for mild renal insufficiency, which is chronic Final Clinical Impressions(s) / ED Diagnoses  Diagnosis #1generalized weakness #2 chronic renal insufficiency Final diagnoses:  None    ED Discharge Orders    None       Orlie Dakin, MD 05/05/18 1303

## 2018-05-05 NOTE — Therapy (Signed)
[place], [place], [address] Phone: [phone]   Fax:  [phone]  Physical Therapy Evaluation  Patient Details  Name: Kerry Barnes MRN: [id number] Date of Birth: 1933/11/25 Referring Provider (PT): Elgergawy, Silver Huguenin, MD   Encounter Date: 05/05/2018  PT End of Session - 05/05/18 0915    Visit Number  1    Number of Visits  13    Date for PT Re-Evaluation  06/24/18    Authorization Type  MCR    PT Start Time  0847    PT Stop Time  0915    PT Time Calculation (min)  28 min    Activity Tolerance  Treatment limited secondary to medical complications (Comment)    Behavior During Therapy  --   fatigued      Past Medical History:  Diagnosis Date  . Anxiety   . Arthritis   . Asthma   . Atypical atrial flutter ([place])   . Cataract   . Chronic diastolic CHF (congestive heart failure) ([place])   . Fibromyalgia   . GERD (gastroesophageal reflux disease)   . History of blood transfusion   . Hyperlipemia   . Hypertension   . IBS (irritable bowel syndrome)   . Seasonal allergies   . Stroke [facility]) 2013   TIA  . Tubular adenoma of colon   . Type 2 diabetes mellitus ([place])     Past Surgical History:  Procedure Laterality Date  . ANKLE FRACTURE SURGERY    . CARDIOVERSION N/A 02/04/2016   Procedure: CARDIOVERSION;  Surgeon: Sueanne Margarita, MD;  Location: [facility] ENDOSCOPY;  Service: Cardiovascular;  Laterality: N/A;  . CARDIOVERSION N/A 05/01/2016   Procedure: CARDIOVERSION;  Surgeon: Fay Records, MD;  Location: [facility] ENDOSCOPY;  Service: Cardiovascular;  Laterality: N/A;  . CARDIOVERSION N/A 04/25/2018   Procedure: CARDIOVERSION;  Surgeon: [name], MD;  Location: [place];  Service: Cardiovascular;  Laterality: N/A;  . CHOLECYSTECTOMY    . TEE WITHOUT CARDIOVERSION N/A 02/04/2016   Procedure: TRANSESOPHAGEAL ECHOCARDIOGRAM (TEE);  Surgeon: Sueanne Margarita, MD;  Location: [facility] ENDOSCOPY;  Service:  Cardiovascular;  Laterality: N/A;  . TUBAL LIGATION    . WRIST FRACTURE SURGERY      Vitals:   05/05/18 0925  BP: (!) 160/52  Pulse: (!) 55  SpO2: 91%     Subjective Assessment - 05/05/18 0853    Subjective   Broke my ankle years ago and had some screws put in it. I feel unstable, like I am going to fall. I use a SPC or rollator when I go out. Has a RW in the closet. Ankle hurts at times, feels like it is going to give out on me, I have a brace that I wear when it hurts. My left hand is numb and I can hardly bend it. "blood spots" on Lt elbow and Rt side of chest.     Currently in Pain?  No/denies         [facility] [place] PT Assessment - 05/05/18 0001      Assessment   Medical Diagnosis  history of ankle fracture    Referring Provider (PT)  Elgergawy, Silver Huguenin, MD    Onset Date/Surgical Date  --   "years"   Next MD Visit  cardiologist next week    Prior Therapy  no      Precautions   Precautions  Fall      Restrictions   Weight Bearing Restrictions  No  Balance Screen   Has the patient fallen in the past 6 months  --   reports multiple falls but does not give timeline   Has the patient had a decrease in activity level because of a fear of falling?   Yes    Is the patient reluctant to leave their home because of a fear of falling?   No      Home Environment   Living Environment  Private residence    Living Arrangements  Spouse/significant other      Prior Function   Level of Independence  Independent with household mobility with device;Requires assistive device for independence      Cognition   Overall Cognitive Status  Difficult to assess    Difficult to assess due to  --   pt not feeling well     Sensation   Additional Comments  lack of sensation in left hand      Posture/Postural Control   Posture Comments  kyphotic                Objective measurements completed on examination: See above findings.              PT Education - 05/05/18  1028    Education Details  anatomy of condition, POC    Person(s) Educated  Patient    Methods  Explanation    Comprehension  Verbalized understanding;Need further instruction                  Plan - 05/05/18 0915    Clinical Impression Statement  Pt presents to PT with complaints of poor functional balance and history of falls. Today she reports she feels like she is going to vomit, she is taking new meds and did not eat this morning, sipped on a protein shake but said it did not make her feel well. due to neausea, fatigue and shaking today We agreed balance tests were not appropriate. Reports she has a follow up with her cardiologist and will need to be cleared for exercises- referral provided to pt for signature. Pt continued to not feel well and vitals were taken (see above) advised that she needs to be seen at hospital quickly and husband chose to drive her to ED. Will continue with testing and treatment as appropriate when patient is cleared.     History and Personal Factors relevant to plan of care:  CHF, multiple falls, fibromyalgia, h/o TIA    Clinical Presentation  Unstable    Clinical Presentation due to:  lack of balance, falls, recent cardiac changes    Clinical Decision Making  High    Rehab Potential  Good    PT Frequency  2x / week    PT Duration  6 weeks    PT Treatment/Interventions  ADLs/Self Care Home Management;Cryotherapy;Gait training;Moist Heat;Stair training;Therapeutic activities;Therapeutic exercise;Balance training;Manual techniques;Patient/family education;Passive range of motion    PT Next Visit Plan  continue evaluation as appropriate    Consulted and Agree with Plan of Care  Patient;Family member/caregiver    Family Member Consulted  husband       Patient will benefit from skilled therapeutic intervention in order to improve the following deficits and impairments:  Difficulty walking, Decreased activity tolerance, Pain, Improper body mechanics,  Decreased balance, Impaired sensation, Postural dysfunction  Visit Diagnosis: Repeated falls - Plan: PT plan of care cert/re-cert  Pain in right ankle and joints of right foot - Plan: PT plan of care cert/re-cert  Problem List Patient Active Problem List   Diagnosis Date Noted  . Chest pressure 04/23/2018  . Abdominal pain 04/23/2018  . Atypical chest pain   . Epigastric abdominal pain   . AKI (acute kidney injury) ([place]) 04/26/2017  . Chronic renal insufficiency, stage III (moderate) ([place]) 04/17/2016  . Chronic anticoagulation 02/05/2016  . Chest pain   . Acute kidney injury superimposed on CKD ([place])   . Atrial flutter with rapid ventricular response ([place]) 01/31/2016  . Asthma with acute exacerbation 09/10/2015  . Difficulty swallowing 09/10/2015  . History of acute bronchitis 09/10/2015  . Moderate persistent asthma 09/10/2015  . Sore throat 09/10/2015  . Left carotid bruit 04/23/2015  . Leg pain 10/10/2013  . Stroke ([place]) 06/16/2011  . Chronic diastolic heart failure ([place]) 11/06/2009  . PULMONARY HYPERTENSION, SECONDARY 07/04/2009  . ARM NUMBNESS 07/04/2009  . Type 2 diabetes with stage 3 chronic kidney disease GFR 30-59 ([place]) 07/03/2009  . Hyperlipidemia 07/03/2009  . MITRAL REGURGITATION, MILD 07/03/2009  . Essential hypertension 07/03/2009  . Gastroparesis 07/03/2009  . DIVERTICULOSIS, COLON, WITH HEMORRHAGE 07/03/2009  . CONSTIPATION 07/03/2009  . IBS 07/03/2009  . ARTHRITIS 07/03/2009  . MIGRAINES, HX OF 07/03/2009  . History of disease 07/03/2009  . EUSTACHIAN TUBE DYSFUNCTION 04/04/2007  . ALLERGIC RHINITIS 04/04/2007  . ASTHMA 04/04/2007  . ESOPHAGITIS, REFLUX 04/04/2007  . Asthma 04/04/2007   Zakariye Nee C. Keaundra Stehle PT, DPT 05/05/18 10:32 AM   [facility] [address] [place], [place], [address] Phone: [phone]   Fax:  [phone]  Name: Kerry Barnes MRN: [id number] Date of Birth:  Sep 24, 1933

## 2018-05-05 NOTE — Discharge Instructions (Addendum)
Call your primary care physician tomorrow to arrange to be seen in the office within the next week.  Return if concern for any reason

## 2018-05-05 NOTE — ED Notes (Signed)
Diet tray ordered 

## 2018-05-05 NOTE — ED Triage Notes (Addendum)
Pt went to physical therapy, reports they told her her blood pressure was high. Reports feeling SOB and dizzy.

## 2018-05-05 NOTE — ED Notes (Signed)
Discharge instructions discussed with Pt. Pt verbalized understanding. Pt stable and ambulatory.    

## 2018-05-10 DIAGNOSIS — I10 Essential (primary) hypertension: Secondary | ICD-10-CM | POA: Diagnosis not present

## 2018-05-10 DIAGNOSIS — I4892 Unspecified atrial flutter: Secondary | ICD-10-CM | POA: Diagnosis not present

## 2018-05-10 DIAGNOSIS — Z9889 Other specified postprocedural states: Secondary | ICD-10-CM | POA: Diagnosis not present

## 2018-05-10 DIAGNOSIS — R001 Bradycardia, unspecified: Secondary | ICD-10-CM | POA: Diagnosis not present

## 2018-05-16 ENCOUNTER — Encounter (INDEPENDENT_AMBULATORY_CARE_PROVIDER_SITE_OTHER): Payer: PPO | Admitting: Ophthalmology

## 2018-05-24 DIAGNOSIS — I4892 Unspecified atrial flutter: Secondary | ICD-10-CM | POA: Diagnosis not present

## 2018-05-24 DIAGNOSIS — I10 Essential (primary) hypertension: Secondary | ICD-10-CM | POA: Diagnosis not present

## 2018-05-27 ENCOUNTER — Ambulatory Visit: Payer: PPO | Admitting: Podiatry

## 2018-05-31 DIAGNOSIS — I4892 Unspecified atrial flutter: Secondary | ICD-10-CM | POA: Diagnosis not present

## 2018-05-31 DIAGNOSIS — Z0189 Encounter for other specified special examinations: Secondary | ICD-10-CM | POA: Diagnosis not present

## 2018-05-31 DIAGNOSIS — I340 Nonrheumatic mitral (valve) insufficiency: Secondary | ICD-10-CM | POA: Diagnosis not present

## 2018-05-31 DIAGNOSIS — Z9889 Other specified postprocedural states: Secondary | ICD-10-CM | POA: Diagnosis not present

## 2018-06-09 ENCOUNTER — Encounter: Payer: Self-pay | Admitting: Podiatry

## 2018-06-09 ENCOUNTER — Ambulatory Visit: Payer: PPO | Admitting: Podiatry

## 2018-06-09 VITALS — BP 152/71

## 2018-06-09 DIAGNOSIS — B351 Tinea unguium: Secondary | ICD-10-CM | POA: Diagnosis not present

## 2018-06-09 DIAGNOSIS — M79674 Pain in right toe(s): Secondary | ICD-10-CM | POA: Diagnosis not present

## 2018-06-09 DIAGNOSIS — L84 Corns and callosities: Secondary | ICD-10-CM | POA: Diagnosis not present

## 2018-06-09 DIAGNOSIS — N183 Chronic kidney disease, stage 3 unspecified: Secondary | ICD-10-CM

## 2018-06-09 DIAGNOSIS — M79675 Pain in left toe(s): Secondary | ICD-10-CM

## 2018-06-09 DIAGNOSIS — E0822 Diabetes mellitus due to underlying condition with diabetic chronic kidney disease: Secondary | ICD-10-CM | POA: Diagnosis not present

## 2018-06-09 DIAGNOSIS — D689 Coagulation defect, unspecified: Secondary | ICD-10-CM | POA: Diagnosis not present

## 2018-06-09 NOTE — Patient Instructions (Addendum)
Diabetes Mellitus and Foot Care Foot care is an important part of your health, especially when you have diabetes. Diabetes may cause you to have problems because of poor blood flow (circulation) to your feet and legs, which can cause your skin to:  Become thinner and drier.  Break more easily.  Heal more slowly.  Peel and crack. You may also have nerve damage (neuropathy) in your legs and feet, causing decreased feeling in them. This means that you may not notice minor injuries to your feet that could lead to more serious problems. Noticing and addressing any potential problems early is the best way to prevent future foot problems. How to care for your feet Foot hygiene  Wash your feet daily with warm water and mild soap. Do not use hot water. Then, pat your feet and the areas between your toes until they are completely dry. Do not soak your feet as this can dry your skin.  Trim your toenails straight across. Do not dig under them or around the cuticle. File the edges of your nails with an emery board or nail file.  Apply a moisturizing lotion or petroleum jelly to the skin on your feet and to dry, brittle toenails. Use lotion that does not contain alcohol and is unscented. Do not apply lotion between your toes. Shoes and socks  Wear clean socks or stockings every day. Make sure they are not too tight. Do not wear knee-high stockings since they may decrease blood flow to your legs.  Wear shoes that fit properly and have enough cushioning. Always look in your shoes before you put them on to be sure there are no objects inside.  To break in new shoes, wear them for just a few hours a day. This prevents injuries on your feet. Wounds, scrapes, corns, and calluses  Check your feet daily for blisters, cuts, bruises, sores, and redness. If you cannot see the bottom of your feet, use a mirror or ask someone for help.  Do not cut corns or calluses or try to remove them with medicine.  If you  find a minor scrape, cut, or break in the skin on your feet, keep it and the skin around it clean and dry. You may clean these areas with mild soap and water. Do not clean the area with peroxide, alcohol, or iodine.  If you have a wound, scrape, corn, or callus on your foot, look at it several times a day to make sure it is healing and not infected. Check for: ? Redness, swelling, or pain. ? Fluid or blood. ? Warmth. ? Pus or a bad smell. General instructions  Do not cross your legs. This may decrease blood flow to your feet.  Do not use heating pads or hot water bottles on your feet. They may burn your skin. If you have lost feeling in your feet or legs, you may not know this is happening until it is too late.  Protect your feet from hot and cold by wearing shoes, such as at the beach or on hot pavement.  Schedule a complete foot exam at least once a year (annually) or more often if you have foot problems. If you have foot problems, report any cuts, sores, or bruises to your health care provider immediately. Contact a health care provider if:  You have a medical condition that increases your risk of infection and you have any cuts, sores, or bruises on your feet.  You have an injury that is not   healing.  You have redness on your legs or feet.  You feel burning or tingling in your legs or feet.  You have pain or cramps in your legs and feet.  Your legs or feet are numb.  Your feet always feel cold.  You have pain around a toenail. Get help right away if:  You have a wound, scrape, corn, or callus on your foot and: ? You have pain, swelling, or redness that gets worse. ? You have fluid or blood coming from the wound, scrape, corn, or callus. ? Your wound, scrape, corn, or callus feels warm to the touch. ? You have pus or a bad smell coming from the wound, scrape, corn, or callus. ? You have a fever. ? You have a red line going up your leg. Summary  Check your feet every day  for cuts, sores, red spots, swelling, and blisters.  Moisturize feet and legs daily.  Wear shoes that fit properly and have enough cushioning.  If you have foot problems, report any cuts, sores, or bruises to your health care provider immediately.  Schedule a complete foot exam at least once a year (annually) or more often if you have foot problems. This information is not intended to replace advice given to you by your health care provider. Make sure you discuss any questions you have with your health care provider. Document Released: 05/29/2000 Document Revised: 07/14/2017 Document Reviewed: 07/03/2016 Elsevier Interactive Patient Education  2019 [employer]  Corns and Calluses Corns are small areas of thickened skin that occur on the top, sides, or tip of a toe. They contain a cone-shaped core with a point that can press on a nerve below. This causes pain.  Calluses are areas of thickened skin that can occur anywhere on the body, including the hands, fingers, palms, soles of the feet, and heels. Calluses are usually larger than corns. What are the causes? Corns and calluses are caused by rubbing (friction) or pressure, such as from shoes that are too tight or do not fit properly. What increases the risk? Corns are more likely to develop in people who have misshapen toes (toe deformities), such as hammer toes. Calluses can occur with friction to any area of the skin. They are more likely to develop in people who:  Work with their hands.  Wear shoes that fit poorly, are too tight, or are high-heeled.  Have toe deformities. What are the signs or symptoms? Symptoms of a corn or callus include:  A hard growth on the skin.  Pain or tenderness under the skin.  Redness and swelling.  Increased discomfort while wearing tight-fitting shoes, if your feet are affected. If a corn or callus becomes infected, symptoms may include:  Redness and swelling that gets worse.  Pain.   Fluid, blood, or pus draining from the corn or callus. How is this diagnosed? Corns and calluses may be diagnosed based on your symptoms, your medical history, and a physical exam. How is this treated? Treatment for corns and calluses may include:  Removing the cause of the friction or pressure. This may involve: ? Changing your shoes. ? Wearing shoe inserts (orthotics) or other protective layers in your shoes, such as a corn pad. ? Wearing gloves.  Applying medicine to the skin (topical medicine) to help soften skin in the hardened, thickened areas.  Removing layers of dead skin with a file to reduce the size of the corn or callus.  Removing the corn or callus with a scalpel   or laser.  Taking antibiotic medicines, if your corn or callus is infected.  Having surgery, if a toe deformity is the cause. Follow these instructions at home:   Take over-the-counter and prescription medicines only as told by your health care provider.  If you were prescribed an antibiotic, take it as told by your health care provider. Do not stop taking it even if your condition starts to improve.  Wear shoes that fit well. Avoid wearing high-heeled shoes and shoes that are too tight or too loose.  Wear any padding, protective layers, gloves, or orthotics as told by your health care provider.  Soak your hands or feet and then use a file or pumice stone to soften your corn or callus. Do this as told by your health care provider.  Check your corn or callus every day for symptoms of infection. Contact a health care provider if you:  Notice that your symptoms do not improve with treatment.  Have redness or swelling that gets worse.  Notice that your corn or callus becomes painful.  Have fluid, blood, or pus coming from your corn or callus.  Have new symptoms. Summary  Corns are small areas of thickened skin that occur on the top, sides, or tip of a toe.  Calluses are areas of thickened skin that  can occur anywhere on the body, including the hands, fingers, palms, and soles of the feet. Calluses are usually larger than corns.  Corns and calluses are caused by rubbing (friction) or pressure, such as from shoes that are too tight or do not fit properly.  Treatment may include wearing any padding, protective layers, gloves, or orthotics as told by your health care provider. This information is not intended to replace advice given to you by your health care provider. Make sure you discuss any questions you have with your health care provider. Document Released: 03/07/2004 Document Revised: 04/14/2017 Document Reviewed: 04/14/2017 Elsevier Interactive Patient Education  2019 [employer] Inc.  Onychomycosis/Fungal Toenails  WHAT IS IT? An infection that lies within the keratin of your nail plate that is caused by a fungus.  WHY ME? Fungal infections affect all ages, sexes, races, and creeds.  There may be many factors that predispose you to a fungal infection such as age, coexisting medical conditions such as diabetes, or an autoimmune disease; stress, medications, fatigue, genetics, etc.  Bottom line: fungus thrives in a warm, moist environment and your shoes offer such a location.  IS IT CONTAGIOUS? Theoretically, yes.  You do not want to share shoes, nail clippers or files with someone who has fungal toenails.  Walking around barefoot in the same room or sleeping in the same bed is unlikely to transfer the organism.  It is important to realize, however, that fungus can spread easily from one nail to the next on the same foot.  HOW DO WE TREAT THIS?  There are several ways to treat this condition.  Treatment may depend on many factors such as age, medications, pregnancy, liver and kidney conditions, etc.  It is best to ask your doctor which options are available to you.  1. No treatment.   Unlike many other medical concerns, you can live with this condition.  However for many people this can be  a painful condition and may lead to ingrown toenails or a bacterial infection.  It is recommended that you keep the nails cut short to help reduce the amount of fungal nail. 2. Topical treatment.  These range from herbal remedies   to prescription strength nail lacquers.  About 40-50% effective, topicals require twice daily application for approximately 9 to 12 months or until an entirely new nail has grown out.  The most effective topicals are medical grade medications available through physicians offices. 3. Oral antifungal medications.  With an 80-90% cure rate, the most common oral medication requires 3 to 4 months of therapy and stays in your system for a year as the new nail grows out.  Oral antifungal medications do require blood work to make sure it is a safe drug for you.  A liver function panel will be performed prior to starting the medication and after the first month of treatment.  It is important to have the blood work performed to avoid any harmful side effects.  In general, this medication safe but blood work is required. 4. Laser Therapy.  This treatment is performed by applying a specialized laser to the affected nail plate.  This therapy is noninvasive, fast, and non-painful.  It is not covered by insurance and is therefore, out of pocket.  The results have been very good with a 80-95% cure rate.  [facility] is the only practice in the area to offer this therapy. 5. Permanent Nail Avulsion.  Removing the entire nail so that a new nail will not grow back. 

## 2018-06-09 NOTE — Progress Notes (Signed)
Subjective: Kerry Barnes presents today with painful, thick toenails 1-5 b/l that she cannot cut and which interfere with daily activities.  Pain is aggravated when wearing enclosed shoe gear.  She is on the blood thinner, Eliquis.  Aletha Halim., PA-C is her PCP and last visit is 04/18/2018.   Current Outpatient Medications:  .  albuterol (PROVENTIL HFA;VENTOLIN HFA) 108 (90 BASE) MCG/ACT inhaler, Inhale into the lungs every 6 (six) hours as needed for wheezing or shortness of breath., Disp: , Rfl:  .  ALPRAZolam (XANAX) 0.5 MG tablet, Take 0.5 mg by mouth 2 (two) times daily as needed for anxiety., Disp: , Rfl:  .  amiodarone (PACERONE) 200 MG tablet, Take 1 tablet (200 mg total) by mouth 2 (two) times daily., Disp: 20 tablet, Rfl: 0 .  apixaban (ELIQUIS) 2.5 MG TABS tablet, Take 1 tablet by mouth twice daily, need to schedule MD appt for further refills (Patient taking differently: Take 2.5 mg by mouth 2 (two) times daily. ), Disp: 60 tablet, Rfl: 0 .  aspirin 325 MG tablet, Take 325 mg by mouth daily., Disp: , Rfl:  .  atorvastatin (LIPITOR) 40 MG tablet, Take 40 mg by mouth daily., Disp: , Rfl:  .  benzonatate (TESSALON) 100 MG capsule, Take 100 mg by mouth as needed., Disp: , Rfl: 0 .  cetirizine (ZYRTEC) 10 MG tablet, Take 10 mg by mouth daily as needed for allergies. , Disp: , Rfl:  .  diltiazem (CARDIZEM SR) 120 MG 12 hr capsule, Take 120 mg by mouth daily., Disp: , Rfl:  .  docusate sodium (COLACE) 100 MG capsule, Take 1 capsule (100 mg total) by mouth 2 (two) times daily., Disp: 60 capsule, Rfl: 2 .  EPINEPHrine (EPI-PEN) 0.3 mg/0.3 mL SOAJ injection, Inject 0.3 mg once as needed into the muscle (severe allergic reaction). , Disp: , Rfl:  .  Fluticasone-Salmeterol (ADVAIR) 100-50 MCG/DOSE AEPB, Inhale 1 puff into the lungs 2 (two) times daily., Disp: , Rfl:  .  ibandronate (BONIVA) 150 MG tablet, Take 150 mg by mouth every 30 (thirty) days., Disp: , Rfl:  .  lubiprostone  (AMITIZA) 24 MCG capsule, Take 1 capsule (24 mcg total) by mouth 2 (two) times daily with a meal., Disp: 60 capsule, Rfl: 3 .  meclizine (ANTIVERT) 25 MG tablet, Take 25 mg by mouth 3 (three) times daily as needed for dizziness. , Disp: , Rfl:  .  methocarbamol (ROBAXIN) 500 MG tablet, Take 1 tablet (500 mg total) every 6 (six) hours as needed by mouth for muscle spasms., Disp: 15 tablet, Rfl: 0 .  mometasone (NASONEX) 50 MCG/ACT nasal spray, Place 2 sprays into the nose daily as needed (nasal congestion). , Disp: , Rfl:  .  montelukast (SINGULAIR) 10 MG tablet, Take 10 mg at bedtime by mouth. , Disp: , Rfl:  .  olopatadine (PATANOL) 0.1 % ophthalmic solution, Place 1 drop into both eyes 2 (two) times daily. , Disp: , Rfl:  .  pantoprazole (PROTONIX) 40 MG tablet, Take 1 tablet (40 mg total) by mouth 2 (two) times daily. Take before breakfast and dinner, Disp: 60 tablet, Rfl: 5 .  polyethylene glycol (MIRALAX) packet, Take 17 g by mouth daily., Disp: 14 each, Rfl: 0 .  pregabalin (LYRICA) 75 MG capsule, Take 75 mg by mouth 2 (two) times daily., Disp: , Rfl:  .  Propylene Glycol (SYSTANE BALANCE OP), Place 1 drop daily as needed into both eyes (dryness). , Disp: , Rfl:  .  solifenacin (VESICARE) 5 MG tablet, Take 5 mg by mouth daily., Disp: , Rfl:  .  sucralfate (CARAFATE) 1 GM/10ML suspension, Take 10 mLs (1 g total) by mouth 4 (four) times daily -  with meals and at bedtime., Disp: 420 mL, Rfl: 0 .  traMADol (ULTRAM) 50 MG tablet, Take 50 mg by mouth every 12 (twelve) hours as needed for moderate pain., Disp: , Rfl:  .  trimethoprim-polymyxin b (POLYTRIM) ophthalmic solution, Place 1 drop into the left eye 4 (four) times daily., Disp: , Rfl: 12  Allergies  Allergen Reactions  . Codeine Nausea Only  . Metformin Diarrhea  . Amoxicillin Rash  . Biaxin [Clarithromycin] Rash  . Ciprofloxacin Rash  . Penicillins Rash    Childhood allergic reaction Has patient had a PCN reaction causing immediate  rash, facial/tongue/throat swelling, SOB or lightheadedness with hypotension: Yes Has patient had a PCN reaction causing severe rash involving mucus membranes or skin necrosis: No Has patient had a PCN reaction that required hospitalization: Yes Has patient had a PCN reaction occurring within the last 10 years: No If all of the above answers are "NO", then may proceed with Cephalosporin use.    Objective:  Vascular Examination: Capillary refill time <3 seconds x 10 digits Dorsalis pedis and Posterior tibial pulses palpable b/l Digital hair sparse x 10 digits Skin temperature gradient WNL b/l  Dermatological Examination: Skin with normal turgor, texture and tone b/l  Toenails 1-5 b/l discolored, thick, dystrophic with subungual debris and pain with palpation to nailbeds due to thickness of nails.  Hyperkeratotic lesion noted submet head 2 b/l with no edema, no flocculence, no erythema, no drainage.  Musculoskeletal: Muscle strength 5/5 to all LE muscle groups  Neurological: Sensation intact with 10 gram monofilament.  Vibratory sensation intact.  Assessment: 1. Painful onychomycosis toenails 1-5 b/l  2. Calluses b/l submet head 2 3. NIDDM with CKD stage 3 4.  Coagulation defect  Plan: 1. Toenails 1-5 b/l were debrided in length and girth without iatrogenic bleeding. 2. Calluses pared submet head 2 b/l without incident 3. Patient to continue soft, supportive shoe gear 4. Patient to report any pedal injuries to medical professional immediately. 5. Follow up 3 months. Patient/POA to call should there be a concern in the interim.

## 2018-06-13 ENCOUNTER — Encounter: Payer: Self-pay | Admitting: Podiatry

## 2018-07-05 DIAGNOSIS — F419 Anxiety disorder, unspecified: Secondary | ICD-10-CM | POA: Diagnosis not present

## 2018-07-05 DIAGNOSIS — H35352 Cystoid macular degeneration, left eye: Secondary | ICD-10-CM | POA: Diagnosis not present

## 2018-07-05 DIAGNOSIS — I10 Essential (primary) hypertension: Secondary | ICD-10-CM | POA: Diagnosis not present

## 2018-07-05 DIAGNOSIS — J4520 Mild intermittent asthma, uncomplicated: Secondary | ICD-10-CM | POA: Diagnosis not present

## 2018-07-05 DIAGNOSIS — E7800 Pure hypercholesterolemia, unspecified: Secondary | ICD-10-CM | POA: Diagnosis not present

## 2018-07-05 DIAGNOSIS — H35039 Hypertensive retinopathy, unspecified eye: Secondary | ICD-10-CM | POA: Diagnosis not present

## 2018-07-05 DIAGNOSIS — J301 Allergic rhinitis due to pollen: Secondary | ICD-10-CM | POA: Diagnosis not present

## 2018-07-05 DIAGNOSIS — H538 Other visual disturbances: Secondary | ICD-10-CM | POA: Diagnosis not present

## 2018-07-14 DIAGNOSIS — E785 Hyperlipidemia, unspecified: Secondary | ICD-10-CM | POA: Diagnosis not present

## 2018-07-14 DIAGNOSIS — M545 Low back pain: Secondary | ICD-10-CM | POA: Diagnosis not present

## 2018-07-14 DIAGNOSIS — I10 Essential (primary) hypertension: Secondary | ICD-10-CM | POA: Diagnosis not present

## 2018-07-14 DIAGNOSIS — J302 Other seasonal allergic rhinitis: Secondary | ICD-10-CM | POA: Diagnosis not present

## 2018-07-14 DIAGNOSIS — H6121 Impacted cerumen, right ear: Secondary | ICD-10-CM | POA: Diagnosis not present

## 2018-07-14 DIAGNOSIS — N3281 Overactive bladder: Secondary | ICD-10-CM | POA: Diagnosis not present

## 2018-07-19 ENCOUNTER — Encounter (INDEPENDENT_AMBULATORY_CARE_PROVIDER_SITE_OTHER): Payer: PPO | Admitting: Ophthalmology

## 2018-07-19 DIAGNOSIS — I10 Essential (primary) hypertension: Secondary | ICD-10-CM | POA: Diagnosis not present

## 2018-07-19 DIAGNOSIS — H43813 Vitreous degeneration, bilateral: Secondary | ICD-10-CM

## 2018-07-19 DIAGNOSIS — H35033 Hypertensive retinopathy, bilateral: Secondary | ICD-10-CM | POA: Diagnosis not present

## 2018-07-19 DIAGNOSIS — H34812 Central retinal vein occlusion, left eye, with macular edema: Secondary | ICD-10-CM

## 2018-07-27 ENCOUNTER — Other Ambulatory Visit (HOSPITAL_COMMUNITY): Payer: Self-pay | Admitting: Cardiology

## 2018-08-01 DIAGNOSIS — N189 Chronic kidney disease, unspecified: Secondary | ICD-10-CM | POA: Diagnosis not present

## 2018-08-01 DIAGNOSIS — E11319 Type 2 diabetes mellitus with unspecified diabetic retinopathy without macular edema: Secondary | ICD-10-CM | POA: Diagnosis not present

## 2018-08-01 DIAGNOSIS — I10 Essential (primary) hypertension: Secondary | ICD-10-CM | POA: Diagnosis not present

## 2018-08-01 DIAGNOSIS — G609 Hereditary and idiopathic neuropathy, unspecified: Secondary | ICD-10-CM | POA: Diagnosis not present

## 2018-08-01 DIAGNOSIS — E7800 Pure hypercholesterolemia, unspecified: Secondary | ICD-10-CM | POA: Diagnosis not present

## 2018-08-01 DIAGNOSIS — E1142 Type 2 diabetes mellitus with diabetic polyneuropathy: Secondary | ICD-10-CM | POA: Diagnosis not present

## 2018-08-16 ENCOUNTER — Encounter (INDEPENDENT_AMBULATORY_CARE_PROVIDER_SITE_OTHER): Payer: HMO | Admitting: Ophthalmology

## 2018-08-16 DIAGNOSIS — H43813 Vitreous degeneration, bilateral: Secondary | ICD-10-CM

## 2018-08-16 DIAGNOSIS — H34812 Central retinal vein occlusion, left eye, with macular edema: Secondary | ICD-10-CM | POA: Diagnosis not present

## 2018-08-16 DIAGNOSIS — H35033 Hypertensive retinopathy, bilateral: Secondary | ICD-10-CM | POA: Diagnosis not present

## 2018-08-16 DIAGNOSIS — I10 Essential (primary) hypertension: Secondary | ICD-10-CM | POA: Diagnosis not present

## 2018-08-26 NOTE — Progress Notes (Signed)
Subjective:  Primary Physician:  Aletha Halim., PA-C  Patient ID: Kerry Barnes, female    DOB: 1934-04-30, 85 y.o.   MRN: [id number]  Chief Complaint  Patient presents with  . Hypertension    3 month F/U    HPI: Kerry Barnes  is a 85 y.o. female . She has history of atypical paroxysmal atrial flutter with cardioversion in August 2017 and again in Nov 2017. Subsequently, she had mostly been mainitaing sinus rhythm until Nov. 2019, when she was admitted to the hospital with chest pain and found to be in atypical atrial flutter with RVR. Underwent successful cardioversion on 04/23/2018. She was seen by EP specialist Dr. Rayann Heman in past and was felt to be a poor candidate for ablation.  Patient has diabetes mellitus type 2 (diet controlled), and hypercholesterolemia. She had hypertension in the past but currently blood pressure has been staying normal without any medications. She does not smoke.  No history of thyroid problems. She had TIA approximately in 2008. Patient had transient aphasia. No history of GI bleed, hematuria or any other bleeding.  Patient is feeling better. She is less tired and has more energy. No c/o of chest pain. She is on Eliquis 2.5mg  BID and tolerating this well. No complaints of GI bleed, hematuria, excessive bruising or any other bleeding.   Past Medical History:  Diagnosis Date  . Anxiety   . Arthritis   . Asthma   . Atypical atrial flutter ([place])   . Cataract   . Chronic diastolic CHF (congestive heart failure) ([place])   . Fibromyalgia   . GERD (gastroesophageal reflux disease)   . History of blood transfusion   . Hyperlipemia   . Hypertension   . IBS (irritable bowel syndrome)   . Seasonal allergies   . Stroke [facility]) 2013   TIA  . Tubular adenoma of colon   . Type 2 diabetes mellitus ([place])     Past Surgical History:  Procedure Laterality Date  . ANKLE FRACTURE SURGERY    . CARDIOVERSION N/A 02/04/2016   Procedure:  CARDIOVERSION;  Surgeon: Sueanne Margarita, MD;  Location: [facility] ENDOSCOPY;  Service: Cardiovascular;  Laterality: N/A;  . CARDIOVERSION N/A 05/01/2016   Procedure: CARDIOVERSION;  Surgeon: Fay Records, MD;  Location: [facility] ENDOSCOPY;  Service: Cardiovascular;  Laterality: N/A;  . CARDIOVERSION N/A 04/25/2018   Procedure: CARDIOVERSION;  Surgeon: [name], MD;  Location: [place];  Service: Cardiovascular;  Laterality: N/A;  . CHOLECYSTECTOMY    . TEE WITHOUT CARDIOVERSION N/A 02/04/2016   Procedure: TRANSESOPHAGEAL ECHOCARDIOGRAM (TEE);  Surgeon: Sueanne Margarita, MD;  Location: [facility] ENDOSCOPY;  Service: Cardiovascular;  Laterality: N/A;  . TUBAL LIGATION    . WRIST FRACTURE SURGERY      Social History   Socioeconomic History  . Marital status: Married    Spouse name: Not on file  . Number of children: 8  . Years of education: Not on file  . Highest education level: Not on file  Occupational History  . Occupation: Arboriculturist: RETIRED  Social Needs  . Financial resource strain: Not on file  . Food insecurity:    Worry: Not on file    Inability: Not on file  . Transportation needs:    Medical: Not on file    Non-medical: Not on file  Tobacco Use  . Smoking status: Never Smoker  . Smokeless tobacco: Never Used  Substance and Sexual Activity  . Alcohol use: No  Alcohol/week: 0.0 standard drinks  . Drug use: No  . Sexual activity: Never    Partners: Male  Lifestyle  . Physical activity:    Days per week: Not on file    Minutes per session: Not on file  . Stress: Not on file  Relationships  . Social connections:    Talks on phone: Not on file    Gets together: Not on file    Attends religious service: Not on file    Active member of club or organization: Not on file    Attends meetings of clubs or organizations: Not on file    Relationship status: Not on file  . Intimate partner violence:    Fear of current or ex partner: Not on file    Emotionally abused: Not  on file    Physically abused: Not on file    Forced sexual activity: Not on file  Other Topics Concern  . Not on file  Social History Narrative   Grandchildren [address] Grandchildren 13    Current Outpatient Medications on File Prior to Visit  Medication Sig Dispense Refill  . albuterol (PROVENTIL HFA;VENTOLIN HFA) 108 (90 BASE) MCG/ACT inhaler Inhale into the lungs every 6 (six) hours as needed for wheezing or shortness of breath.    Marland Kitchen amiodarone (PACERONE) 200 MG tablet TAKE 1 TABLET(200 MG) BY MOUTH TWICE DAILY 60 tablet 1  . apixaban (ELIQUIS) 2.5 MG TABS tablet Take 1 tablet by mouth twice daily, need to schedule MD appt for further refills (Patient taking differently: No sig reported) 60 tablet 0  . atorvastatin (LIPITOR) 40 MG tablet Take 40 mg by mouth daily.    . benzonatate (TESSALON) 100 MG capsule Take 100 mg by mouth as needed.  0  . cetirizine (ZYRTEC) 10 MG tablet Take 10 mg by mouth daily as needed for allergies.     . cycloSPORINE (RESTASIS) 0.05 % ophthalmic emulsion Apply 1 drop to eye daily.    Marland Kitchen diltiazem (CARDIZEM SR) 120 MG 12 hr capsule Take 120 mg by mouth daily.    Marland Kitchen docusate sodium (COLACE) 100 MG capsule Take 1 capsule (100 mg total) by mouth 2 (two) times daily. 60 capsule 2  . doxycycline (VIBRAMYCIN) 100 MG capsule Take 100 mg by mouth 2 (two) times daily.    Marland Kitchen EPINEPHrine (EPI-PEN) 0.3 mg/0.3 mL SOAJ injection Inject 0.3 mg once as needed into the muscle (severe allergic reaction).     . Fluticasone-Salmeterol (ADVAIR) 100-50 MCG/DOSE AEPB Inhale 1 puff into the lungs 2 (two) times daily.    Marland Kitchen ibandronate (BONIVA) 150 MG tablet Take 150 mg by mouth every 30 (thirty) days.    . meclizine (ANTIVERT) 25 MG tablet Take 25 mg by mouth 3 (three) times daily as needed for dizziness.     . methocarbamol (ROBAXIN) 500 MG tablet Take 1 tablet (500 mg total) every 6 (six) hours as needed by mouth for muscle spasms. 15 tablet 0  . mometasone (NASONEX) 50 MCG/ACT  nasal spray Place 2 sprays into the nose daily as needed (nasal congestion).     Marland Kitchen olopatadine (PATANOL) 0.1 % ophthalmic solution Place 1 drop into both eyes 2 (two) times daily.     . polyethylene glycol (MIRALAX) packet Take 17 g by mouth daily. 14 each 0  . pregabalin (LYRICA) 75 MG capsule Take 75 mg by mouth 2 (two) times daily.    Marland Kitchen Propylene Glycol (SYSTANE BALANCE OP) Place 1 drop  daily as needed into both eyes (dryness).     . solifenacin (VESICARE) 5 MG tablet Take 5 mg by mouth daily.    . sucralfate (CARAFATE) 1 GM/10ML suspension Take 10 mLs (1 g total) by mouth 4 (four) times daily -  with meals and at bedtime. 420 mL 0  . traMADol (ULTRAM) 50 MG tablet Take 50 mg by mouth every 12 (twelve) hours as needed for moderate pain.    Marland Kitchen trimethoprim-polymyxin b (POLYTRIM) ophthalmic solution Place 1 drop into the left eye 4 (four) times daily.  12  . ALPRAZolam (XANAX) 0.5 MG tablet Take 0.5 mg by mouth 2 (two) times daily as needed for anxiety.    Marland Kitchen lubiprostone (AMITIZA) 24 MCG capsule Take 1 capsule (24 mcg total) by mouth 2 (two) times daily with a meal. (Patient not taking: Reported on 08/29/2018) 60 capsule 3  . montelukast (SINGULAIR) 10 MG tablet Take 10 mg at bedtime by mouth.     . pantoprazole (PROTONIX) 40 MG tablet Take 1 tablet (40 mg total) by mouth 2 (two) times daily. Take before breakfast and dinner (Patient not taking: Reported on 08/29/2018) 60 tablet 5   No current facility-administered medications on file prior to visit.    Review of Systems  Constitutional: Negative for fever and malaise/fatigue.  HENT: Negative for nosebleeds.   Eyes: Negative for blurred vision.  Respiratory: Positive for shortness of breath. Negative for cough and hemoptysis.   Gastrointestinal: Negative for abdominal pain, heartburn, nausea and vomiting.  Genitourinary: Negative for hematuria.  Musculoskeletal: Negative for myalgias.  Neurological: Negative for focal weakness and headaches.   Endo/Heme/Allergies: Does not bruise/bleed easily.  Psychiatric/Behavioral: Negative for depression.         Objective:  Blood pressure 132/79, pulse (!) 56, height 5\' 1"  (1.549 m), weight 142 lb 3.2 oz (64.5 kg), SpO2 99 %. Body mass index is 26.87 kg/m.  Physical Exam  Constitutional: She is oriented to person, place, and time. She appears well-developed and well-nourished.  HENT:  Head: Normocephalic and atraumatic.  Eyes: Pupils are equal, round, and reactive to light.  Neck: No edema present. No thyromegaly present.  Cardiovascular: Normal rate, regular rhythm and normal heart sounds. Exam reveals no gallop.  No murmur heard. Pulses:      Carotid pulses are 2+ on the right side and 2+ on the left side.      Femoral pulses are 2+ on the right side and 2+ on the left side.      Dorsalis pedis pulses are 2+ on the right side and 2+ on the left side.       Posterior tibial pulses are 2+ on the right side and 2+ on the left side.  Pulmonary/Chest: She has no wheezes. She has no rales.  Abdominal: She exhibits no mass. There is no abdominal tenderness.  Musculoskeletal:        General: No edema.  Lymphadenopathy:    She has no cervical adenopathy.  Neurological: She is alert and oriented to person, place, and time.  Skin: No cyanosis. Nails show no clubbing.    CARDIAC STUDIES:  Echo- 05/24/2018 1. Left ventricle cavity is normal in size. Normal global wall motion. Calculated EF 68%. 2. Left atrial cavity is moderate to severely dilated. 3. Mild calcification of the aortic valve annulus. 4. Mild to moderate mitral regurgitation. 5. Mild to moderate tricuspid regurgitation. Mild pulmonary hypertension with approx. PA syst. pressure of 33 mm of Hg.  Lexiscan Myoview scans-April 2015-negative  for ischemia.   Assessment & Recommendations: 1. Paroxysmal Atrial Flutter 2. Hypertension  3. Mild Mitral Regurgitation 4. Laboratory Examination:  Labs- 1121/2019: WBC-7.4,  hemoglobin-13.3, hematocrit-43.4, platelets 275 Glucose-88, BUN-14, creatinine-1.37, GFR-40. Sodium-137, potassium-4.0  01/11/2018- Cholesterol 204, LDL 131, HDL 67, triglycerides 116   Recommendation:   Arrhythmia remains stable.  Patient did not have any further symptoms of palpitation or rapid heartbeat. Blood pressure is stable. No clinical evidence of heart failure.  She will continue Eliquis 2.5mg  BID in view of her age. serum creatinine from hospital on 05/05/18 was 1.37 with GFR- 40. No bleeding diathesis. Side effects of Eliquis therapy were explained at length. Possible bleeding complications including serious internal bleeding including but, not limited to- GI bleed, hematuria, hemorrhagic stroke etc. and death were explained. Patient was advised that if there is any internal bleeding or excessive bruising etc., stop the medicine and call us. Patient was also advised to avoid aspirin, ibuprofen, Aleve or other NSAIDs. Patient verbalized understanding.  Primary prevention was discussed. Patient was advised to follow low-salt, low-cholesterol diet.   Return for follow-up after 3 months but call us earlier if there are any cardiac problems.  Despina Hick, MD, [facility] 08/29/2018, 7:19 PM [facility] [place] Pager: [phone] Office: [phone] If no answer Cell [phone]

## 2018-08-29 ENCOUNTER — Other Ambulatory Visit: Payer: Self-pay

## 2018-08-29 ENCOUNTER — Other Ambulatory Visit (HOSPITAL_COMMUNITY): Payer: Self-pay | Admitting: Cardiology

## 2018-08-29 ENCOUNTER — Encounter: Payer: Self-pay | Admitting: Cardiology

## 2018-08-29 ENCOUNTER — Ambulatory Visit (INDEPENDENT_AMBULATORY_CARE_PROVIDER_SITE_OTHER): Payer: HMO | Admitting: Cardiology

## 2018-08-29 VITALS — BP 132/79 | HR 56 | Ht 61.0 in | Wt 142.2 lb

## 2018-08-29 DIAGNOSIS — I080 Rheumatic disorders of both mitral and aortic valves: Secondary | ICD-10-CM | POA: Diagnosis not present

## 2018-08-29 DIAGNOSIS — Z0189 Encounter for other specified special examinations: Secondary | ICD-10-CM | POA: Diagnosis not present

## 2018-08-29 DIAGNOSIS — I10 Essential (primary) hypertension: Secondary | ICD-10-CM | POA: Diagnosis not present

## 2018-08-29 DIAGNOSIS — I4892 Unspecified atrial flutter: Secondary | ICD-10-CM

## 2018-08-29 NOTE — Telephone Encounter (Signed)
Is this ok to send?  

## 2018-08-31 ENCOUNTER — Other Ambulatory Visit: Payer: Self-pay

## 2018-08-31 NOTE — Telephone Encounter (Signed)
Yes, please. Refill 30 tabs, 6 refills.

## 2018-08-31 NOTE — Patient Outreach (Signed)
  [facility]) Care Management Chronic Special Needs Program  08/31/2018  Name: Kerry Barnes DOB: [birth year]  MRN: [id number]  [name] is enrolled in a chronic special needs plan for Heart Failure. Chronic Care Management Coordinator telephoned client to review health risk assessment and to develop individualized care plan.  Introduced the chronic care management program, importance of client participation, and taking their care plan to all provider appointments and inpatient facilities.  Reviewed the transition of care process and possible referral to community care management.  Subjective: client reports history of heart failure, diabetes, heart disease, mini stroke, hypertension, per chart atrial flutter, client reports recent to the cardiologist and her heart was in rhythm. Client manages her own medications and has not questions. No need with medication management identified. Client declines pharmacy referral for medication review. She reports she has a pharmacist friend who reviews her medications. Client is without questions or concerns at this time.  Goals Addressed            This Visit's Progress   . Client understands the importance of follow-up with providers by attending scheduled visits      . Client verbalize knowledge of Heart Failure diease self management within the next 6-9 months.      . Client will not report change from baseline and no repeated symptoms of  mini-stroke with in the next 6 months.      . Client will report no worsening of symptoms related to heart disease within the next 6 months.      . Client will verbalize knowledge of diabetes self-management as evidenced by Hgb A1C <7 or as defined by provider.       Diabetes self management actions:  Glucose monitoring per provider recommendations  Perform Quality checks on blood meter  Eat Healthy  Check feet daily  Visit provider every 3-6 months as directed  Hbg A1C  level every 3-6 months.  Eye Exam yearly    . Client will verbalize knowledge of self management of Hypertension as evidences by BP reading of 140/90 or less; or as defined by provider      . Maintain timely refills of Heart Failure medication as prescribed within the year       . Obtain annual  Lipid Profile, LDL-C      . Visit Primary Care Provider or Cardiologist at least 2 times per year         Plan:  Send successful outreach letter with a copy of their individualized care plan, Send individual care plan to provider and Send educational material Chronic care management coordination will outreach in:  6 Months    Thea Silversmith, RN, MSN, [facility] [phone]

## 2018-09-08 ENCOUNTER — Ambulatory Visit: Payer: PPO | Admitting: Podiatry

## 2018-09-09 ENCOUNTER — Ambulatory Visit: Payer: HMO | Admitting: Podiatry

## 2018-09-09 ENCOUNTER — Other Ambulatory Visit: Payer: Self-pay

## 2018-09-09 ENCOUNTER — Encounter: Payer: Self-pay | Admitting: Podiatry

## 2018-09-09 VITALS — Temp 98.4°F

## 2018-09-09 DIAGNOSIS — N183 Chronic kidney disease, stage 3 unspecified: Secondary | ICD-10-CM

## 2018-09-09 DIAGNOSIS — M79675 Pain in left toe(s): Secondary | ICD-10-CM

## 2018-09-09 DIAGNOSIS — L84 Corns and callosities: Secondary | ICD-10-CM | POA: Diagnosis not present

## 2018-09-09 DIAGNOSIS — M79674 Pain in right toe(s): Secondary | ICD-10-CM | POA: Diagnosis not present

## 2018-09-09 DIAGNOSIS — B351 Tinea unguium: Secondary | ICD-10-CM

## 2018-09-09 DIAGNOSIS — E0822 Diabetes mellitus due to underlying condition with diabetic chronic kidney disease: Secondary | ICD-10-CM

## 2018-09-12 ENCOUNTER — Encounter: Payer: Self-pay | Admitting: Podiatry

## 2018-09-12 DIAGNOSIS — N189 Chronic kidney disease, unspecified: Secondary | ICD-10-CM | POA: Diagnosis not present

## 2018-09-12 DIAGNOSIS — E1142 Type 2 diabetes mellitus with diabetic polyneuropathy: Secondary | ICD-10-CM | POA: Diagnosis not present

## 2018-09-12 NOTE — Progress Notes (Signed)
Subjective: Kerry Barnes presents for preventative diabetic foot care on today's visit. She has brought in her [employer] packet on today. She voices no new pedal concerns on today's visit.  She states her blood sugar was 124 mg/dl this morning.  She is on the blood thinner, Eliquis.  Aletha Halim., PA-C is her PCP and last visit is 04/18/2018.   Current Outpatient Medications:  .  albuterol (PROVENTIL HFA;VENTOLIN HFA) 108 (90 BASE) MCG/ACT inhaler, Inhale into the lungs every 6 (six) hours as needed for wheezing or shortness of breath., Disp: , Rfl:  .  ALPRAZolam (XANAX) 0.5 MG tablet, Take 0.5 mg by mouth 2 (two) times daily as needed for anxiety., Disp: , Rfl:  .  amiodarone (PACERONE) 200 MG tablet, TAKE 1 TABLET BY MOUTH TWICE DAILY, Disp: 60 tablet, Rfl: 2 .  apixaban (ELIQUIS) 2.5 MG TABS tablet, Take 1 tablet by mouth twice daily, need to schedule MD appt for further refills (Patient taking differently: No sig reported), Disp: 60 tablet, Rfl: 0 .  atorvastatin (LIPITOR) 40 MG tablet, Take 40 mg by mouth daily., Disp: , Rfl:  .  benzonatate (TESSALON) 100 MG capsule, Take 100 mg by mouth as needed., Disp: , Rfl: 0 .  cetirizine (ZYRTEC) 10 MG tablet, Take 10 mg by mouth daily as needed for allergies. , Disp: , Rfl:  .  cycloSPORINE (RESTASIS) 0.05 % ophthalmic emulsion, Apply 1 drop to eye daily., Disp: , Rfl:  .  diltiazem (CARDIZEM SR) 120 MG 12 hr capsule, Take 120 mg by mouth daily., Disp: , Rfl:  .  docusate sodium (COLACE) 100 MG capsule, Take 1 capsule (100 mg total) by mouth 2 (two) times daily., Disp: 60 capsule, Rfl: 2 .  doxycycline (VIBRAMYCIN) 100 MG capsule, Take 100 mg by mouth 2 (two) times daily., Disp: , Rfl:  .  EPINEPHrine (EPI-PEN) 0.3 mg/0.3 mL SOAJ injection, Inject 0.3 mg once as needed into the muscle (severe allergic reaction). , Disp: , Rfl:  .  Fluticasone-Salmeterol (ADVAIR) 100-50 MCG/DOSE AEPB, Inhale 1 puff into the lungs 2 (two)  times daily., Disp: , Rfl:  .  ibandronate (BONIVA) 150 MG tablet, Take 150 mg by mouth every 30 (thirty) days., Disp: , Rfl:  .  lubiprostone (AMITIZA) 24 MCG capsule, Take 1 capsule (24 mcg total) by mouth 2 (two) times daily with a meal., Disp: 60 capsule, Rfl: 3 .  meclizine (ANTIVERT) 25 MG tablet, Take 25 mg by mouth 3 (three) times daily as needed for dizziness. , Disp: , Rfl:  .  methocarbamol (ROBAXIN) 500 MG tablet, Take 1 tablet (500 mg total) every 6 (six) hours as needed by mouth for muscle spasms., Disp: 15 tablet, Rfl: 0 .  mometasone (NASONEX) 50 MCG/ACT nasal spray, Place 2 sprays into the nose daily as needed (nasal congestion). , Disp: , Rfl:  .  montelukast (SINGULAIR) 10 MG tablet, Take 10 mg at bedtime by mouth. , Disp: , Rfl:  .  olopatadine (PATANOL) 0.1 % ophthalmic solution, Place 1 drop into both eyes 2 (two) times daily. , Disp: , Rfl:  .  pantoprazole (PROTONIX) 40 MG tablet, Take 1 tablet (40 mg total) by mouth 2 (two) times daily. Take before breakfast and dinner, Disp: 60 tablet, Rfl: 5 .  polyethylene glycol (MIRALAX) packet, Take 17 g by mouth daily., Disp: 14 each, Rfl: 0 .  pregabalin (LYRICA) 75 MG capsule, Take 75 mg by mouth 2 (two) times daily., Disp: , Rfl:  .  Propylene Glycol (SYSTANE BALANCE OP), Place 1 drop daily as needed into both eyes (dryness). , Disp: , Rfl:  .  solifenacin (VESICARE) 5 MG tablet, Take 5 mg by mouth daily., Disp: , Rfl:  .  sucralfate (CARAFATE) 1 GM/10ML suspension, Take 10 mLs (1 g total) by mouth 4 (four) times daily -  with meals and at bedtime., Disp: 420 mL, Rfl: 0 .  traMADol (ULTRAM) 50 MG tablet, Take 50 mg by mouth every 12 (twelve) hours as needed for moderate pain., Disp: , Rfl:  .  trimethoprim-polymyxin b (POLYTRIM) ophthalmic solution, Place 1 drop into the left eye 4 (four) times daily., Disp: , Rfl: 12  Allergies  Allergen Reactions  . Codeine Nausea Only  . Metformin Diarrhea  . Amoxicillin Rash  . Biaxin  [Clarithromycin] Rash  . Ciprofloxacin Rash  . Penicillins Rash    Childhood allergic reaction Has patient had a PCN reaction causing immediate rash, facial/tongue/throat swelling, SOB or lightheadedness with hypotension: Yes Has patient had a PCN reaction causing severe rash involving mucus membranes or skin necrosis: No Has patient had a PCN reaction that required hospitalization: Yes Has patient had a PCN reaction occurring within the last 10 years: No If all of the above answers are "NO", then may proceed with Cephalosporin use.    Objective:  Vascular Examination: Capillary refill time <3 seconds x 10 digits.  Dorsalis pedis and Posterior tibial pulses palpable b/l.  Digital hair sparse x 10 digits.  Skin temperature gradient WNL b/l.  Dermatological Examination: Skin with normal turgor, texture and tone b/l.  Toenails 1-5 b/l discolored, thick, dystrophic with subungual debris and pain with palpation to nailbeds due to thickness of nails.  Hyperkeratotic lesion noted submet head 2 b/l with no edema, no flocculence, no erythema, no drainage.  Musculoskeletal: Muscle strength 5/5 to all LE muscle groups  Neurological: Sensation intact with 10 gram monofilament.  Vibratory sensation intact.  Assessment: 1. Painful onychomycosis toenails 1-5 b/l  2. Calluses b/l submet head 2 3. NIDDM with CKD stage 3 4. Long term blood thinner  Plan: 1. Toenails 1-5 b/l were debrided in length and girth without iatrogenic bleeding. 2. Calluses pared submet head 2 b/l without incident 3. Patient to continue soft, supportive shoe gear. 4. Patient to report any pedal injuries to medical professional immediately. 5. Follow up 3 months. Patient/POA to call should there be a concern in the interim.

## 2018-09-13 ENCOUNTER — Other Ambulatory Visit: Payer: Self-pay

## 2018-09-13 ENCOUNTER — Encounter (INDEPENDENT_AMBULATORY_CARE_PROVIDER_SITE_OTHER): Payer: HMO | Admitting: Ophthalmology

## 2018-09-13 DIAGNOSIS — I10 Essential (primary) hypertension: Secondary | ICD-10-CM

## 2018-09-13 DIAGNOSIS — H35033 Hypertensive retinopathy, bilateral: Secondary | ICD-10-CM

## 2018-09-13 DIAGNOSIS — H43813 Vitreous degeneration, bilateral: Secondary | ICD-10-CM

## 2018-09-13 DIAGNOSIS — H34812 Central retinal vein occlusion, left eye, with macular edema: Secondary | ICD-10-CM

## 2018-09-22 ENCOUNTER — Other Ambulatory Visit: Payer: Self-pay

## 2018-09-22 MED ORDER — APIXABAN 2.5 MG PO TABS: ORAL_TABLET | ORAL | 0 refills | Status: DC

## 2018-10-11 ENCOUNTER — Other Ambulatory Visit: Payer: Self-pay

## 2018-10-11 ENCOUNTER — Encounter (INDEPENDENT_AMBULATORY_CARE_PROVIDER_SITE_OTHER): Payer: HMO | Admitting: Ophthalmology

## 2018-10-11 DIAGNOSIS — H35033 Hypertensive retinopathy, bilateral: Secondary | ICD-10-CM | POA: Diagnosis not present

## 2018-10-11 DIAGNOSIS — H43813 Vitreous degeneration, bilateral: Secondary | ICD-10-CM

## 2018-10-11 DIAGNOSIS — I10 Essential (primary) hypertension: Secondary | ICD-10-CM | POA: Diagnosis not present

## 2018-10-11 DIAGNOSIS — H34812 Central retinal vein occlusion, left eye, with macular edema: Secondary | ICD-10-CM | POA: Diagnosis not present

## 2018-10-20 ENCOUNTER — Other Ambulatory Visit: Payer: Self-pay | Admitting: Internal Medicine

## 2018-11-08 ENCOUNTER — Other Ambulatory Visit: Payer: Self-pay

## 2018-11-08 ENCOUNTER — Encounter (INDEPENDENT_AMBULATORY_CARE_PROVIDER_SITE_OTHER): Payer: HMO | Admitting: Ophthalmology

## 2018-11-08 DIAGNOSIS — H34812 Central retinal vein occlusion, left eye, with macular edema: Secondary | ICD-10-CM | POA: Diagnosis not present

## 2018-11-08 DIAGNOSIS — H43813 Vitreous degeneration, bilateral: Secondary | ICD-10-CM | POA: Diagnosis not present

## 2018-11-08 DIAGNOSIS — H35033 Hypertensive retinopathy, bilateral: Secondary | ICD-10-CM | POA: Diagnosis not present

## 2018-11-08 DIAGNOSIS — I10 Essential (primary) hypertension: Secondary | ICD-10-CM

## 2018-11-28 ENCOUNTER — Ambulatory Visit: Payer: HMO | Admitting: Cardiology

## 2018-12-09 ENCOUNTER — Other Ambulatory Visit: Payer: Self-pay

## 2018-12-09 ENCOUNTER — Encounter: Payer: Self-pay | Admitting: Podiatry

## 2018-12-09 ENCOUNTER — Ambulatory Visit: Payer: HMO | Admitting: Podiatry

## 2018-12-09 VITALS — Temp 97.3°F

## 2018-12-09 DIAGNOSIS — L84 Corns and callosities: Secondary | ICD-10-CM

## 2018-12-09 DIAGNOSIS — N183 Chronic kidney disease, stage 3 unspecified: Secondary | ICD-10-CM

## 2018-12-09 DIAGNOSIS — M79675 Pain in left toe(s): Secondary | ICD-10-CM | POA: Diagnosis not present

## 2018-12-09 DIAGNOSIS — E0822 Diabetes mellitus due to underlying condition with diabetic chronic kidney disease: Secondary | ICD-10-CM

## 2018-12-09 DIAGNOSIS — M79674 Pain in right toe(s): Secondary | ICD-10-CM | POA: Diagnosis not present

## 2018-12-09 DIAGNOSIS — B351 Tinea unguium: Secondary | ICD-10-CM | POA: Diagnosis not present

## 2018-12-09 NOTE — Patient Instructions (Signed)
Corns and Calluses Corns are small areas of thickened skin that occur on the top, sides, or tip of a toe. They contain a cone-shaped core with a point that can press on a nerve below. This causes pain.  Calluses are areas of thickened skin that can occur anywhere on the body, including the hands, fingers, palms, soles of the feet, and heels. Calluses are usually larger than corns. What are the causes? Corns and calluses are caused by rubbing (friction) or pressure, such as from shoes that are too tight or do not fit properly. What increases the risk? Corns are more likely to develop in people who have misshapen toes (toe deformities), such as hammer toes. Calluses can occur with friction to any area of the skin. They are more likely to develop in people who:  Work with their hands.  Wear shoes that fit poorly, are too tight, or are high-heeled.  Have toe deformities. What are the signs or symptoms? Symptoms of a corn or callus include:  A hard growth on the skin.  Pain or tenderness under the skin.  Redness and swelling.  Increased discomfort while wearing tight-fitting shoes, if your feet are affected. If a corn or callus becomes infected, symptoms may include:  Redness and swelling that gets worse.  Pain.  Fluid, blood, or pus draining from the corn or callus. How is this diagnosed? Corns and calluses may be diagnosed based on your symptoms, your medical history, and a physical exam. How is this treated? Treatment for corns and calluses may include:  Removing the cause of the friction or pressure. This may involve: ? Changing your shoes. ? Wearing shoe inserts (orthotics) or other protective layers in your shoes, such as a corn pad. ? Wearing gloves.  Applying medicine to the skin (topical medicine) to help soften skin in the hardened, thickened areas.  Removing layers of dead skin with a file to reduce the size of the corn or callus.  Removing the corn or callus with a  scalpel or laser.  Taking antibiotic medicines, if your corn or callus is infected.  Having surgery, if a toe deformity is the cause. Follow these instructions at home:   Take over-the-counter and prescription medicines only as told by your health care provider.  If you were prescribed an antibiotic, take it as told by your health care provider. Do not stop taking it even if your condition starts to improve.  Wear shoes that fit well. Avoid wearing high-heeled shoes and shoes that are too tight or too loose.  Wear any padding, protective layers, gloves, or orthotics as told by your health care provider.  Soak your hands or feet and then use a file or pumice stone to soften your corn or callus. Do this as told by your health care provider.  Check your corn or callus every day for symptoms of infection. Contact a health care provider if you:  Notice that your symptoms do not improve with treatment.  Have redness or swelling that gets worse.  Notice that your corn or callus becomes painful.  Have fluid, blood, or pus coming from your corn or callus.  Have new symptoms. Summary  Corns are small areas of thickened skin that occur on the top, sides, or tip of a toe.  Calluses are areas of thickened skin that can occur anywhere on the body, including the hands, fingers, palms, and soles of the feet. Calluses are usually larger than corns.  Corns and calluses are caused by   rubbing (friction) or pressure, such as from shoes that are too tight or do not fit properly.  Treatment may include wearing any padding, protective layers, gloves, or orthotics as told by your health care provider. This information is not intended to replace advice given to you by your health care provider. Make sure you discuss any questions you have with your health care provider. Document Released: 03/07/2004 Document Revised: 04/14/2017 Document Reviewed: 04/14/2017 Elsevier Interactive Patient Education   2019 [employer]  Onychomycosis/Fungal Toenails  WHAT IS IT? An infection that lies within the keratin of your nail plate that is caused by a fungus.  WHY ME? Fungal infections affect all ages, sexes, races, and creeds.  There may be many factors that predispose you to a fungal infection such as age, coexisting medical conditions such as diabetes, or an autoimmune disease; stress, medications, fatigue, genetics, etc.  Bottom line: fungus thrives in a warm, moist environment and your shoes offer such a location.  IS IT CONTAGIOUS? Theoretically, yes.  You do not want to share shoes, nail clippers or files with someone who has fungal toenails.  Walking around barefoot in the same room or sleeping in the same bed is unlikely to transfer the organism.  It is important to realize, however, that fungus can spread easily from one nail to the next on the same foot.  HOW DO WE TREAT THIS?  There are several ways to treat this condition.  Treatment may depend on many factors such as age, medications, pregnancy, liver and kidney conditions, etc.  It is best to ask your doctor which options are available to you.  1. No treatment.   Unlike many other medical concerns, you can live with this condition.  However for many people this can be a painful condition and may lead to ingrown toenails or a bacterial infection.  It is recommended that you keep the nails cut short to help reduce the amount of fungal nail. 2. Topical treatment.  These range from herbal remedies to prescription strength nail lacquers.  About 40-50% effective, topicals require twice daily application for approximately 9 to 12 months or until an entirely new nail has grown out.  The most effective topicals are medical grade medications available through physicians offices. 3. Oral antifungal medications.  With an 80-90% cure rate, the most common oral medication requires 3 to 4 months of therapy and stays in your system for a year as the new nail  grows out.  Oral antifungal medications do require blood work to make sure it is a safe drug for you.  A liver function panel will be performed prior to starting the medication and after the first month of treatment.  It is important to have the blood work performed to avoid any harmful side effects.  In general, this medication safe but blood work is required. 4. Laser Therapy.  This treatment is performed by applying a specialized laser to the affected nail plate.  This therapy is noninvasive, fast, and non-painful.  It is not covered by insurance and is therefore, out of pocket.  The results have been very good with a 80-95% cure rate.  [facility] is the only practice in the area to offer this therapy. 5. Permanent Nail Avulsion.  Removing the entire nail so that a new nail will not grow back. 

## 2018-12-14 ENCOUNTER — Other Ambulatory Visit: Payer: Self-pay

## 2018-12-14 ENCOUNTER — Encounter (INDEPENDENT_AMBULATORY_CARE_PROVIDER_SITE_OTHER): Payer: HMO | Admitting: Ophthalmology

## 2018-12-14 DIAGNOSIS — I10 Essential (primary) hypertension: Secondary | ICD-10-CM | POA: Diagnosis not present

## 2018-12-14 DIAGNOSIS — H35033 Hypertensive retinopathy, bilateral: Secondary | ICD-10-CM | POA: Diagnosis not present

## 2018-12-14 DIAGNOSIS — H34812 Central retinal vein occlusion, left eye, with macular edema: Secondary | ICD-10-CM | POA: Diagnosis not present

## 2018-12-14 DIAGNOSIS — H43813 Vitreous degeneration, bilateral: Secondary | ICD-10-CM

## 2018-12-17 NOTE — Progress Notes (Signed)
Subjective: Kerry Barnes presents today with painful, thick toenails 1-5 b/l that she cannot cut and which interfere with daily activities.  Pain is aggravated when wearing enclosed shoe gear.  Aletha Halim., PA-C is her PCP.  Ms. Eynon relates she lost her oldest son on Mother's Day.    Current Outpatient Medications:  .  albuterol (PROVENTIL HFA;VENTOLIN HFA) 108 (90 BASE) MCG/ACT inhaler, Inhale into the lungs every 6 (six) hours as needed for wheezing or shortness of breath., Disp: , Rfl:  .  ALPRAZolam (XANAX) 0.5 MG tablet, Take 0.5 mg by mouth 2 (two) times daily as needed for anxiety., Disp: , Rfl:  .  amiodarone (PACERONE) 200 MG tablet, TAKE 1 TABLET BY MOUTH TWICE DAILY, Disp: 60 tablet, Rfl: 2 .  amLODipine (NORVASC) 5 MG tablet, , Disp: , Rfl:  .  apixaban (ELIQUIS) 2.5 MG TABS tablet, Take 1 tablet by mouth twice daily, need to schedule MD appt for further refills, Disp: 180 tablet, Rfl: 0 .  atorvastatin (LIPITOR) 40 MG tablet, Take 40 mg by mouth daily., Disp: , Rfl:  .  benzonatate (TESSALON) 100 MG capsule, Take 100 mg by mouth as needed., Disp: , Rfl: 0 .  cetirizine (ZYRTEC) 10 MG tablet, Take 10 mg by mouth daily as needed for allergies. , Disp: , Rfl:  .  cycloSPORINE (RESTASIS) 0.05 % ophthalmic emulsion, Apply 1 drop to eye daily., Disp: , Rfl:  .  diltiazem (CARDIZEM SR) 120 MG 12 hr capsule, Take 120 mg by mouth daily., Disp: , Rfl:  .  docusate sodium (COLACE) 100 MG capsule, Take 1 capsule (100 mg total) by mouth 2 (two) times daily., Disp: 60 capsule, Rfl: 2 .  doxycycline (VIBRAMYCIN) 100 MG capsule, Take 100 mg by mouth 2 (two) times daily., Disp: , Rfl:  .  EPINEPHrine (EPI-PEN) 0.3 mg/0.3 mL SOAJ injection, Inject 0.3 mg once as needed into the muscle (severe allergic reaction). , Disp: , Rfl:  .  Fluticasone-Salmeterol (ADVAIR) 100-50 MCG/DOSE AEPB, Inhale 1 puff into the lungs 2 (two) times daily., Disp: , Rfl:  .  ibandronate (BONIVA) 150 MG  tablet, Take 150 mg by mouth every 30 (thirty) days., Disp: , Rfl:  .  JANUVIA 50 MG tablet, , Disp: , Rfl:  .  LINZESS 290 MCG CAPS capsule, TAKE ONE CAPSULE BY MOUTH DAILY BEFORE BREAKFAST(NEEDS OFFICE VISIT FOR FURTHER REFILLS), Disp: 90 capsule, Rfl: 0 .  lubiprostone (AMITIZA) 24 MCG capsule, Take 1 capsule (24 mcg total) by mouth 2 (two) times daily with a meal., Disp: 60 capsule, Rfl: 3 .  meclizine (ANTIVERT) 25 MG tablet, Take 25 mg by mouth 3 (three) times daily as needed for dizziness. , Disp: , Rfl:  .  methocarbamol (ROBAXIN) 500 MG tablet, Take 1 tablet (500 mg total) every 6 (six) hours as needed by mouth for muscle spasms., Disp: 15 tablet, Rfl: 0 .  mometasone (NASONEX) 50 MCG/ACT nasal spray, Place 2 sprays into the nose daily as needed (nasal congestion). , Disp: , Rfl:  .  montelukast (SINGULAIR) 10 MG tablet, Take 10 mg at bedtime by mouth. , Disp: , Rfl:  .  olopatadine (PATANOL) 0.1 % ophthalmic solution, Place 1 drop into both eyes 2 (two) times daily. , Disp: , Rfl:  .  pantoprazole (PROTONIX) 40 MG tablet, Take 1 tablet (40 mg total) by mouth 2 (two) times daily. Take before breakfast and dinner, Disp: 60 tablet, Rfl: 5 .  polyethylene glycol (MIRALAX) packet, Take 17  g by mouth daily., Disp: 14 each, Rfl: 0 .  pregabalin (LYRICA) 75 MG capsule, Take 75 mg by mouth 2 (two) times daily., Disp: , Rfl:  .  Propylene Glycol (SYSTANE BALANCE OP), Place 1 drop daily as needed into both eyes (dryness). , Disp: , Rfl:  .  solifenacin (VESICARE) 5 MG tablet, Take 5 mg by mouth daily., Disp: , Rfl:  .  sucralfate (CARAFATE) 1 GM/10ML suspension, Take 10 mLs (1 g total) by mouth 4 (four) times daily -  with meals and at bedtime., Disp: 420 mL, Rfl: 0 .  traMADol (ULTRAM) 50 MG tablet, Take 50 mg by mouth every 12 (twelve) hours as needed for moderate pain., Disp: , Rfl:  .  trimethoprim-polymyxin b (POLYTRIM) ophthalmic solution, Place 1 drop into the left eye 4 (four) times daily.,  Disp: , Rfl: 12  Allergies  Allergen Reactions  . Codeine Nausea Only  . Metformin Diarrhea  . Amoxicillin Rash  . Biaxin [Clarithromycin] Rash  . Ciprofloxacin Rash  . Penicillins Rash    Childhood allergic reaction Has patient had a PCN reaction causing immediate rash, facial/tongue/throat swelling, SOB or lightheadedness with hypotension: Yes Has patient had a PCN reaction causing severe rash involving mucus membranes or skin necrosis: No Has patient had a PCN reaction that required hospitalization: Yes Has patient had a PCN reaction occurring within the last 10 years: No If all of the above answers are "NO", then may proceed with Cephalosporin use.    Objective: Vitals:   12/09/18 0823  Temp: (!) 97.3 F (36.3 C)    Vascular Examination: Capillary refill time <3 seconds x 10 digits.  Dorsalis pedis and Posterior tibial pulses palpable b/l.  Digital hair sparse x 10 digits.  Skin temperature gradient WNL b/l.  Dermatological Examination: Skin with normal turgor, texture and tone b/l.  Toenails 1-5 b/l discolored, thick, dystrophic with subungual debris and pain with palpation to nailbeds due to thickness of nails.  Hyperkeratotic lesion submet head 2 b/l and plantar right heel. No edema, no erythema, no drainage, no flocculence.  Musculoskeletal: Muscle strength 5/5 to all LE muscle groups  HAV with bunion b/l.  Hammertoes 2-5 b/l.  No pain, crepitus or joint limitation noted with ROM.   Neurological: Sensation intact with 10 gram monofilament.  Vibratory sensation intact.  Assessment: Painful onychomycosis toenails 1-5 b/l  Calluses submet head 2 b/l and plantar right heel  NIDDM with CKD stage 3  Plan: 1. Extended our sincerest condolences to her in the loss of her son. 2. Toenails 1-5 b/l were debrided in length and girth without iatrogenic bleeding. Calluses pared submetatarsal head(s) 2 b/l and plantar right heel utilizing sterile scalpel blade  without incident. 3. Patient to continue soft, supportive shoe gear daily. 4. Patient to report any pedal injuries to medical professional immediately. 5. Follow up 3 months.  6. Patient/POA to call should there be a concern in the interim.

## 2018-12-21 ENCOUNTER — Other Ambulatory Visit: Payer: Self-pay | Admitting: Cardiology

## 2018-12-21 NOTE — Telephone Encounter (Signed)
Please fill

## 2019-01-11 ENCOUNTER — Ambulatory Visit: Payer: HMO | Admitting: Cardiology

## 2019-01-16 DIAGNOSIS — Z0000 Encounter for general adult medical examination without abnormal findings: Secondary | ICD-10-CM | POA: Diagnosis not present

## 2019-01-16 DIAGNOSIS — N3281 Overactive bladder: Secondary | ICD-10-CM | POA: Diagnosis not present

## 2019-01-16 DIAGNOSIS — E785 Hyperlipidemia, unspecified: Secondary | ICD-10-CM | POA: Diagnosis not present

## 2019-01-16 DIAGNOSIS — K219 Gastro-esophageal reflux disease without esophagitis: Secondary | ICD-10-CM | POA: Diagnosis not present

## 2019-01-16 DIAGNOSIS — I10 Essential (primary) hypertension: Secondary | ICD-10-CM | POA: Diagnosis not present

## 2019-01-18 ENCOUNTER — Encounter (INDEPENDENT_AMBULATORY_CARE_PROVIDER_SITE_OTHER): Payer: HMO | Admitting: Ophthalmology

## 2019-01-18 ENCOUNTER — Other Ambulatory Visit: Payer: Self-pay

## 2019-01-18 DIAGNOSIS — I10 Essential (primary) hypertension: Secondary | ICD-10-CM

## 2019-01-18 DIAGNOSIS — H43813 Vitreous degeneration, bilateral: Secondary | ICD-10-CM | POA: Diagnosis not present

## 2019-01-18 DIAGNOSIS — H35033 Hypertensive retinopathy, bilateral: Secondary | ICD-10-CM | POA: Diagnosis not present

## 2019-01-18 DIAGNOSIS — H34812 Central retinal vein occlusion, left eye, with macular edema: Secondary | ICD-10-CM | POA: Diagnosis not present

## 2019-02-13 ENCOUNTER — Encounter (INDEPENDENT_AMBULATORY_CARE_PROVIDER_SITE_OTHER): Payer: HMO | Admitting: Ophthalmology

## 2019-02-13 ENCOUNTER — Other Ambulatory Visit: Payer: Self-pay

## 2019-02-13 DIAGNOSIS — H34812 Central retinal vein occlusion, left eye, with macular edema: Secondary | ICD-10-CM

## 2019-02-13 DIAGNOSIS — I10 Essential (primary) hypertension: Secondary | ICD-10-CM

## 2019-02-13 DIAGNOSIS — H43813 Vitreous degeneration, bilateral: Secondary | ICD-10-CM | POA: Diagnosis not present

## 2019-02-13 DIAGNOSIS — H35033 Hypertensive retinopathy, bilateral: Secondary | ICD-10-CM

## 2019-02-23 ENCOUNTER — Other Ambulatory Visit: Payer: Self-pay

## 2019-02-23 NOTE — Patient Outreach (Signed)
  [facility]) Care Management Chronic Special Needs Program  02/23/2019  Name: Kerry Barnes DOB: [birth year]  MRN: [id number]  Kerry Barnes is enrolled in a chronic special needs plan for Heart Failure. Reviewed and updated care plan.  Subjective: Client reports her blood sugars are doing well. She states her blood sugar this morning was 98, She states she has been taken off of insulin by her provider. She reports she has followed up with her cardiologist at least three times this year. She states she weighs and records her weight every day. She denies SOB;increased edema, increased weight gain. She reports, "I am doing good". She reports receiving a call from her pharmacist stating that her pharmacy was having an issue with her refill, but client was not clear on what the issue is.     Goals Addressed            This Visit's Progress   . COMPLETED: Client understands the importance of follow-up with providers by attending scheduled visits       Reports attends visits as scheduled.    . COMPLETED: Client verbalize knowledge of Heart Failure disease self management within the next 6-9 months.       Voiced taking medications, attending provider visits, weigh and recording weights, monitoring sodium intake.    . COMPLETED: Client will not report change from baseline and no repeated symptoms of  mini-stroke with in the next 6 months.       No reports of change from baseline.    . COMPLETED: Client will report no worsening of symptoms related to heart disease within the next 6 months.       Denies any issues or concerns.    . COMPLETED: Client will verbalize knowledge of diabetes self-management as evidenced by Hgb A1C <7 or as defined by provider.       Reports last A1C 6.1    . Client will verbalize knowledge of self management of Hypertension as evidences by BP reading of 140/90 or less; or as defined by provider   On track    Reports takes medications as  prescribed, attends follow up visits as scheduled, monitors sodium intake    . Maintain timely refills of Heart Failure medication as prescribed within the year    On track   . COMPLETED: Obtain annual  Lipid Profile, LDL-C       Done 01/16/2019    . COMPLETED: Visit Primary Care Provider or Cardiologist at least 2 times per year       Reports has seen cardiologist at least twice this year.      Covid-19 precautions discussed. Reinforced face covering when out in public; keeping at least 6' distance when out in public and handwashing.  RNCM encouraged client to call RNCM as needed. Also reinforced the availability of the 24 hour nurse advice line and the healthcare concierge for benefits questions.  RNCM called client's pharmacy who reports that they had to do a partial refill because they did not have all the medication in stock at the time, but is in-stock now and will call client and inform her.  Plan: RNCM will send updated care plan to client; send updated care plan to primary care.  Chronic care management coordinator will outreach in:  6 Months   Thea Silversmith, RN, MSN, [place] [phone]   .

## 2019-03-10 ENCOUNTER — Other Ambulatory Visit: Payer: Self-pay

## 2019-03-10 ENCOUNTER — Encounter: Payer: Self-pay | Admitting: Podiatry

## 2019-03-10 ENCOUNTER — Ambulatory Visit: Payer: HMO | Admitting: Podiatry

## 2019-03-10 DIAGNOSIS — N183 Chronic kidney disease, stage 3 unspecified: Secondary | ICD-10-CM

## 2019-03-10 DIAGNOSIS — B351 Tinea unguium: Secondary | ICD-10-CM

## 2019-03-10 DIAGNOSIS — L84 Corns and callosities: Secondary | ICD-10-CM

## 2019-03-10 DIAGNOSIS — M79675 Pain in left toe(s): Secondary | ICD-10-CM | POA: Diagnosis not present

## 2019-03-10 DIAGNOSIS — E0822 Diabetes mellitus due to underlying condition with diabetic chronic kidney disease: Secondary | ICD-10-CM

## 2019-03-10 DIAGNOSIS — M79674 Pain in right toe(s): Secondary | ICD-10-CM | POA: Diagnosis not present

## 2019-03-10 NOTE — Patient Instructions (Signed)
Diabetes Mellitus and Foot Care Foot care is an important part of your health, especially when you have diabetes. Diabetes may cause you to have problems because of poor blood flow (circulation) to your feet and legs, which can cause your skin to:  Become thinner and drier.  Break more easily.  Heal more slowly.  Peel and crack. You may also have nerve damage (neuropathy) in your legs and feet, causing decreased feeling in them. This means that you may not notice minor injuries to your feet that could lead to more serious problems. Noticing and addressing any potential problems early is the best way to prevent future foot problems. How to care for your feet Foot hygiene  Wash your feet daily with warm water and mild soap. Do not use hot water. Then, pat your feet and the areas between your toes until they are completely dry. Do not soak your feet as this can dry your skin.  Trim your toenails straight across. Do not dig under them or around the cuticle. File the edges of your nails with an emery board or nail file.  Apply a moisturizing lotion or petroleum jelly to the skin on your feet and to dry, brittle toenails. Use lotion that does not contain alcohol and is unscented. Do not apply lotion between your toes. Shoes and socks  Wear clean socks or stockings every day. Make sure they are not too tight. Do not wear knee-high stockings since they may decrease blood flow to your legs.  Wear shoes that fit properly and have enough cushioning. Always look in your shoes before you put them on to be sure there are no objects inside.  To break in new shoes, wear them for just a few hours a day. This prevents injuries on your feet. Wounds, scrapes, corns, and calluses  Check your feet daily for blisters, cuts, bruises, sores, and redness. If you cannot see the bottom of your feet, use a mirror or ask someone for help.  Do not cut corns or calluses or try to remove them with medicine.  If you  find a minor scrape, cut, or break in the skin on your feet, keep it and the skin around it clean and dry. You may clean these areas with mild soap and water. Do not clean the area with peroxide, alcohol, or iodine.  If you have a wound, scrape, corn, or callus on your foot, look at it several times a day to make sure it is healing and not infected. Check for: ? Redness, swelling, or pain. ? Fluid or blood. ? Warmth. ? Pus or a bad smell. General instructions  Do not cross your legs. This may decrease blood flow to your feet.  Do not use heating pads or hot water bottles on your feet. They may burn your skin. If you have lost feeling in your feet or legs, you may not know this is happening until it is too late.  Protect your feet from hot and cold by wearing shoes, such as at the beach or on hot pavement.  Schedule a complete foot exam at least once a year (annually) or more often if you have foot problems. If you have foot problems, report any cuts, sores, or bruises to your health care provider immediately. Contact a health care provider if:  You have a medical condition that increases your risk of infection and you have any cuts, sores, or bruises on your feet.  You have an injury that is not   healing.  You have redness on your legs or feet.  You feel burning or tingling in your legs or feet.  You have pain or cramps in your legs and feet.  Your legs or feet are numb.  Your feet always feel cold.  You have pain around a toenail. Get help right away if:  You have a wound, scrape, corn, or callus on your foot and: ? You have pain, swelling, or redness that gets worse. ? You have fluid or blood coming from the wound, scrape, corn, or callus. ? Your wound, scrape, corn, or callus feels warm to the touch. ? You have pus or a bad smell coming from the wound, scrape, corn, or callus. ? You have a fever. ? You have a red line going up your leg. Summary  Check your feet every day  for cuts, sores, red spots, swelling, and blisters.  Moisturize feet and legs daily.  Wear shoes that fit properly and have enough cushioning.  If you have foot problems, report any cuts, sores, or bruises to your health care provider immediately.  Schedule a complete foot exam at least once a year (annually) or more often if you have foot problems. This information is not intended to replace advice given to you by your health care provider. Make sure you discuss any questions you have with your health care provider. Document Released: 05/29/2000 Document Revised: 07/14/2017 Document Reviewed: 07/03/2016 Elsevier Patient Education  2020 [employer]  Corns and Calluses Corns are small areas of thickened skin that occur on the top, sides, or tip of a toe. They contain a cone-shaped core with a point that can press on a nerve below. This causes pain.  Calluses are areas of thickened skin that can occur anywhere on the body, including the hands, fingers, palms, soles of the feet, and heels. Calluses are usually larger than corns. What are the causes? Corns and calluses are caused by rubbing (friction) or pressure, such as from shoes that are too tight or do not fit properly. What increases the risk? Corns are more likely to develop in people who have misshapen toes (toe deformities), such as hammer toes. Calluses can occur with friction to any area of the skin. They are more likely to develop in people who:  Work with their hands.  Wear shoes that fit poorly, are too tight, or are high-heeled.  Have toe deformities. What are the signs or symptoms? Symptoms of a corn or callus include:  A hard growth on the skin.  Pain or tenderness under the skin.  Redness and swelling.  Increased discomfort while wearing tight-fitting shoes, if your feet are affected. If a corn or callus becomes infected, symptoms may include:  Redness and swelling that gets worse.  Pain.  Fluid, blood, or  pus draining from the corn or callus. How is this diagnosed? Corns and calluses may be diagnosed based on your symptoms, your medical history, and a physical exam. How is this treated? Treatment for corns and calluses may include:  Removing the cause of the friction or pressure. This may involve: ? Changing your shoes. ? Wearing shoe inserts (orthotics) or other protective layers in your shoes, such as a corn pad. ? Wearing gloves.  Applying medicine to the skin (topical medicine) to help soften skin in the hardened, thickened areas.  Removing layers of dead skin with a file to reduce the size of the corn or callus.  Removing the corn or callus with a scalpel or   laser.  Taking antibiotic medicines, if your corn or callus is infected.  Having surgery, if a toe deformity is the cause. Follow these instructions at home:   Take over-the-counter and prescription medicines only as told by your health care provider.  If you were prescribed an antibiotic, take it as told by your health care provider. Do not stop taking it even if your condition starts to improve.  Wear shoes that fit well. Avoid wearing high-heeled shoes and shoes that are too tight or too loose.  Wear any padding, protective layers, gloves, or orthotics as told by your health care provider.  Soak your hands or feet and then use a file or pumice stone to soften your corn or callus. Do this as told by your health care provider.  Check your corn or callus every day for symptoms of infection. Contact a health care provider if you:  Notice that your symptoms do not improve with treatment.  Have redness or swelling that gets worse.  Notice that your corn or callus becomes painful.  Have fluid, blood, or pus coming from your corn or callus.  Have new symptoms. Summary  Corns are small areas of thickened skin that occur on the top, sides, or tip of a toe.  Calluses are areas of thickened skin that can occur anywhere  on the body, including the hands, fingers, palms, and soles of the feet. Calluses are usually larger than corns.  Corns and calluses are caused by rubbing (friction) or pressure, such as from shoes that are too tight or do not fit properly.  Treatment may include wearing any padding, protective layers, gloves, or orthotics as told by your health care provider. This information is not intended to replace advice given to you by your health care provider. Make sure you discuss any questions you have with your health care provider. Document Released: 03/07/2004 Document Revised: 09/21/2018 Document Reviewed: 04/14/2017 Elsevier Patient Education  2020 [employer]  Onychomycosis/Fungal Toenails  WHAT IS IT? An infection that lies within the keratin of your nail plate that is caused by a fungus.  WHY ME? Fungal infections affect all ages, sexes, races, and creeds.  There may be many factors that predispose you to a fungal infection such as age, coexisting medical conditions such as diabetes, or an autoimmune disease; stress, medications, fatigue, genetics, etc.  Bottom line: fungus thrives in a warm, moist environment and your shoes offer such a location.  IS IT CONTAGIOUS? Theoretically, yes.  You do not want to share shoes, nail clippers or files with someone who has fungal toenails.  Walking around barefoot in the same room or sleeping in the same bed is unlikely to transfer the organism.  It is important to realize, however, that fungus can spread easily from one nail to the next on the same foot.  HOW DO WE TREAT THIS?  There are several ways to treat this condition.  Treatment may depend on many factors such as age, medications, pregnancy, liver and kidney conditions, etc.  It is best to ask your doctor which options are available to you.  1. No treatment.   Unlike many other medical concerns, you can live with this condition.  However for many people this can be a painful condition and may  lead to ingrown toenails or a bacterial infection.  It is recommended that you keep the nails cut short to help reduce the amount of fungal nail. 2. Topical treatment.  These range from herbal remedies to prescription   strength nail lacquers.  About 40-50% effective, topicals require twice daily application for approximately 9 to 12 months or until an entirely new nail has grown out.  The most effective topicals are medical grade medications available through physicians offices. 3. Oral antifungal medications.  With an 80-90% cure rate, the most common oral medication requires 3 to 4 months of therapy and stays in your system for a year as the new nail grows out.  Oral antifungal medications do require blood work to make sure it is a safe drug for you.  A liver function panel will be performed prior to starting the medication and after the first month of treatment.  It is important to have the blood work performed to avoid any harmful side effects.  In general, this medication safe but blood work is required. 4. Laser Therapy.  This treatment is performed by applying a specialized laser to the affected nail plate.  This therapy is noninvasive, fast, and non-painful.  It is not covered by insurance and is therefore, out of pocket.  The results have been very good with a 80-95% cure rate.  [facility] is the only practice in the area to offer this therapy. 5. Permanent Nail Avulsion.  Removing the entire nail so that a new nail will not grow back. 

## 2019-03-14 NOTE — Progress Notes (Signed)
Subjective: Kerry Barnes is seen today for follow up preventative diabetic foot care with calluses and painful, elongated, thickened toenails 1-5 b/l feet that she cannot cut. Pain interferes with daily activities. Aggravating factor includes wearing enclosed shoe gear and relieved with periodic debridement.  Current Outpatient Medications on File Prior to Visit  Medication Sig  . albuterol (PROVENTIL HFA;VENTOLIN HFA) 108 (90 BASE) MCG/ACT inhaler Inhale into the lungs every 6 (six) hours as needed for wheezing or shortness of breath.  . ALPRAZolam (XANAX) 0.5 MG tablet Take 0.5 mg by mouth 2 (two) times daily as needed for anxiety.  Marland Kitchen amiodarone (PACERONE) 200 MG tablet TAKE 1 TABLET BY MOUTH TWICE DAILY  . amLODipine (NORVASC) 5 MG tablet   . atorvastatin (LIPITOR) 40 MG tablet Take 40 mg by mouth daily.  . benzonatate (TESSALON) 100 MG capsule Take 100 mg by mouth as needed.  . cetirizine (ZYRTEC) 10 MG tablet Take 10 mg by mouth daily as needed for allergies.   . cycloSPORINE (RESTASIS) 0.05 % ophthalmic emulsion Apply 1 drop to eye daily.  Marland Kitchen diltiazem (CARDIZEM SR) 120 MG 12 hr capsule Take 120 mg by mouth daily.  Marland Kitchen docusate sodium (COLACE) 100 MG capsule Take 1 capsule (100 mg total) by mouth 2 (two) times daily.  Marland Kitchen doxycycline (VIBRAMYCIN) 100 MG capsule Take 100 mg by mouth 2 (two) times daily.  Marland Kitchen ELIQUIS 2.5 MG TABS tablet TAKE 1 TABLET BY MOUTH TWICE DAILY  . EPINEPHrine (EPI-PEN) 0.3 mg/0.3 mL SOAJ injection Inject 0.3 mg once as needed into the muscle (severe allergic reaction).   . Fluticasone-Salmeterol (ADVAIR) 100-50 MCG/DOSE AEPB Inhale 1 puff into the lungs 2 (two) times daily.  Marland Kitchen FLUZONE HIGH-DOSE QUADRIVALENT 0.7 ML SUSY U UTD  . ibandronate (BONIVA) 150 MG tablet Take 150 mg by mouth every 30 (thirty) days.  Marland Kitchen JANUVIA 50 MG tablet   . levalbuterol (XOPENEX HFA) 45 MCG/ACT inhaler Inhale into the lungs.  Marland Kitchen LINZESS 290 MCG CAPS capsule TAKE ONE CAPSULE BY MOUTH DAILY  BEFORE BREAKFAST(NEEDS OFFICE VISIT FOR FURTHER REFILLS)  . lubiprostone (AMITIZA) 24 MCG capsule Take 1 capsule (24 mcg total) by mouth 2 (two) times daily with a meal.  . meclizine (ANTIVERT) 25 MG tablet Take 25 mg by mouth 3 (three) times daily as needed for dizziness.   . methocarbamol (ROBAXIN) 500 MG tablet Take 1 tablet (500 mg total) every 6 (six) hours as needed by mouth for muscle spasms.  . mometasone (NASONEX) 50 MCG/ACT nasal spray Place 2 sprays into the nose daily as needed (nasal congestion).   . montelukast (SINGULAIR) 10 MG tablet Take 10 mg at bedtime by mouth.   Marland Kitchen olopatadine (PATANOL) 0.1 % ophthalmic solution Place 1 drop into both eyes 2 (two) times daily.   . pantoprazole (PROTONIX) 40 MG tablet Take 1 tablet (40 mg total) by mouth 2 (two) times daily. Take before breakfast and dinner  . polyethylene glycol (MIRALAX) packet Take 17 g by mouth daily.  . pregabalin (LYRICA) 75 MG capsule Take 75 mg by mouth 2 (two) times daily.  Marland Kitchen Propylene Glycol (SYSTANE BALANCE OP) Place 1 drop daily as needed into both eyes (dryness).   . solifenacin (VESICARE) 5 MG tablet Take 5 mg by mouth daily.  . sucralfate (CARAFATE) 1 GM/10ML suspension Take 10 mLs (1 g total) by mouth 4 (four) times daily -  with meals and at bedtime.  . traMADol (ULTRAM) 50 MG tablet Take 50 mg by mouth every 12 (  twelve) hours as needed for moderate pain.  Marland Kitchen trimethoprim-polymyxin b (POLYTRIM) ophthalmic solution Place 1 drop into the left eye 4 (four) times daily.   No current facility-administered medications on file prior to visit.      Allergies  Allergen Reactions  . Codeine Nausea Only  . Metformin Diarrhea  . Amoxicillin Rash  . Biaxin [Clarithromycin] Rash  . Ciprofloxacin Rash  . Penicillins Rash    Childhood allergic reaction Has patient had a PCN reaction causing immediate rash, facial/tongue/throat swelling, SOB or lightheadedness with hypotension: Yes Has patient had a PCN reaction causing  severe rash involving mucus membranes or skin necrosis: No Has patient had a PCN reaction that required hospitalization: Yes Has patient had a PCN reaction occurring within the last 10 years: No If all of the above answers are "NO", then may proceed with Cephalosporin use.   Objective:  Vascular Examination: Capillary refill time <3 seconds  x 10 digits.  Dorsalis pedis present b/l.  Posterior tibial pulses present b/l.  Digital hair  present x 10 digits.  Skin temperature gradient WNL b/l.   Dermatological Examination: Skin with normal turgor, texture and tone b/l.  Toenails 1-5 b/l discolored, thick, dystrophic with subungual debris and pain with palpation to nailbeds due to thickness of nails.  Hyperkeratotic lesions b/l heels with tenderness to palpation. No edema, no erythema, no drainage, no flocculence. No callus submet head 2 today.  Musculoskeletal: Muscle strength 5/5 to all LE muscle groups  HAV with bunion b/l. Hammerteos 2-5 b/l.  No pain, crepitus or joint limitation noted with ROM.   Neurological Examination: Protective sensation intact with 10 gram monofilament bilaterally.  Epicritic sensation present bilaterally.  Vibratory sensation intact bilaterally.   Assessment: Painful onychomycosis toenails 1-5 b/l  Calluses b/l heels NIDDM with CKD stage 3   Plan: 1. Toenails 1-5 b/l were debrided in length and girth without iatrogenic bleeding. 2. Hyperkeratotic lesions pared b/l heels utilizing sterile scalpel blade without incident. 3. Patient to continue soft, supportive shoe gear. 4. Patient to report any pedal injuries to medical professional immediately. 5. Follow up 3 months.  6. Patient/POA to call should there be a concern in the interim.

## 2019-03-20 ENCOUNTER — Encounter (INDEPENDENT_AMBULATORY_CARE_PROVIDER_SITE_OTHER): Payer: HMO | Admitting: Ophthalmology

## 2019-03-20 ENCOUNTER — Other Ambulatory Visit: Payer: Self-pay

## 2019-03-20 DIAGNOSIS — H35033 Hypertensive retinopathy, bilateral: Secondary | ICD-10-CM

## 2019-03-20 DIAGNOSIS — I10 Essential (primary) hypertension: Secondary | ICD-10-CM

## 2019-03-20 DIAGNOSIS — H43813 Vitreous degeneration, bilateral: Secondary | ICD-10-CM

## 2019-03-20 DIAGNOSIS — H34812 Central retinal vein occlusion, left eye, with macular edema: Secondary | ICD-10-CM

## 2019-03-22 ENCOUNTER — Other Ambulatory Visit: Payer: Self-pay

## 2019-03-22 MED ORDER — APIXABAN 2.5 MG PO TABS: 2.5000 mg | ORAL_TABLET | Freq: Two times a day (BID) | ORAL | 0 refills | Status: DC

## 2019-03-26 NOTE — Progress Notes (Signed)
Follow up visit  Subjective:   Kerry Barnes, female    DOB: 03/08/1934, 85 y.o.   MRN: [id number]    Chief Complaint  Patient presents with  . Atrial Flutter  . Follow-up    3 month     HPI  85 year old African-American female with hypertension, hyperlipidemia, paroxysmal atrial flutter, mild to moderate MR, TR, mild PH, CKD stage 3.  Patient was previously evaluated by Dr. Rayann Heman in 2017 and felt to be poor candidate for flutter ablation. She has been doing well and denies chest pain, shortness of breath, palpitations, leg edema, orthopnea, PND, TIA/syncope.  Patient lives at home home with her husband and uses cane for ambulation when she goes outside. She is able to perform all her ADL's without any difficulty.   She had blood work done with her PCP 4 months ago. Results not available to me.    Past Medical History:  Diagnosis Date  . Anxiety   . Arthritis   . Asthma   . Atypical atrial flutter ([place])   . Cataract   . Chronic diastolic CHF (congestive heart failure) ([place])   . Fibromyalgia   . GERD (gastroesophageal reflux disease)   . History of blood transfusion   . Hyperlipemia   . Hypertension   . IBS (irritable bowel syndrome)   . Seasonal allergies   . Stroke [facility]) 2013   TIA  . Tubular adenoma of colon   . Type 2 diabetes mellitus ([place])      Past Surgical History:  Procedure Laterality Date  . ANKLE FRACTURE SURGERY    . CARDIOVERSION N/A 02/04/2016   Procedure: CARDIOVERSION;  Surgeon: Sueanne Margarita, MD;  Location: [facility] [place] ENDOSCOPY;  Service: Cardiovascular;  Laterality: N/A;  . CARDIOVERSION N/A 05/01/2016   Procedure: CARDIOVERSION;  Surgeon: Fay Records, MD;  Location: [facility] ENDOSCOPY;  Service: Cardiovascular;  Laterality: N/A;  . CARDIOVERSION N/A 04/25/2018   Procedure: CARDIOVERSION;  Surgeon: [name], MD;  Location: [place];  Service: Cardiovascular;  Laterality: N/A;  . CHOLECYSTECTOMY    . TEE WITHOUT CARDIOVERSION N/A 02/04/2016    Procedure: TRANSESOPHAGEAL ECHOCARDIOGRAM (TEE);  Surgeon: Sueanne Margarita, MD;  Location: [facility] ENDOSCOPY;  Service: Cardiovascular;  Laterality: N/A;  . TUBAL LIGATION    . WRIST FRACTURE SURGERY       Social History   Socioeconomic History  . Marital status: Married    Spouse name: Not on file  . Number of children: 8  . Years of education: Not on file  . Highest education level: Not on file  Occupational History  . Occupation: Arboriculturist: RETIRED  Social Needs  . Financial resource strain: Not on file  . Food insecurity    Worry: Not on file    Inability: Not on file  . Transportation needs    Medical: Not on file    Non-medical: Not on file  Tobacco Use  . Smoking status: Never Smoker  . Smokeless tobacco: Never Used  Substance and Sexual Activity  . Alcohol use: No    Alcohol/week: 0.0 standard drinks  . Drug use: No  . Sexual activity: Never    Partners: Male  Lifestyle  . Physical activity    Days per week: Not on file    Minutes per session: Not on file  . Stress: Not on file  Relationships  . Social Herbalist on phone: Not on file    Gets together: Not on file  Attends religious service: Not on file    Active member of club or organization: Not on file    Attends meetings of clubs or organizations: Not on file    Relationship status: Not on file  . Intimate partner violence    Fear of current or ex partner: Not on file    Emotionally abused: Not on file    Physically abused: Not on file    Forced sexual activity: Not on file  Other Topics Concern  . Not on file  Social History Narrative   Grandchildren 14      Great Grandchildren 13     Family History  Problem Relation Age of Onset  . Diabetes Mother   . Hypertension Mother   . Diabetes Father   . Diabetes Sister   . Diabetes Brother   . Hypertension Brother   . Colon cancer Neg Hx   . Stomach cancer Neg Hx      Current Outpatient Medications on File Prior to  Visit  Medication Sig Dispense Refill  . acetaminophen (TYLENOL) 325 MG tablet Take 650 mg by mouth every 6 (six) hours as needed.    Marland Kitchen albuterol (PROVENTIL HFA;VENTOLIN HFA) 108 (90 BASE) MCG/ACT inhaler Inhale into the lungs every 6 (six) hours as needed for wheezing or shortness of breath.    Marland Kitchen amiodarone (PACERONE) 200 MG tablet TAKE 1 TABLET BY MOUTH TWICE DAILY 60 tablet 2  . amLODipine (NORVASC) 5 MG tablet     . apixaban (ELIQUIS) 2.5 MG TABS tablet Take 1 tablet (2.5 mg total) by mouth 2 (two) times daily. 60 tablet 0  . atorvastatin (LIPITOR) 40 MG tablet Take 40 mg by mouth daily.    . cycloSPORINE (RESTASIS) 0.05 % ophthalmic emulsion Apply 1 drop to eye daily.    Marland Kitchen EPINEPHrine (EPI-PEN) 0.3 mg/0.3 mL SOAJ injection Inject 0.3 mg once as needed into the muscle (severe allergic reaction).     Marland Kitchen JANUVIA 50 MG tablet     . levalbuterol (XOPENEX HFA) 45 MCG/ACT inhaler Inhale into the lungs.    . methocarbamol (ROBAXIN) 500 MG tablet Take 1 tablet (500 mg total) every 6 (six) hours as needed by mouth for muscle spasms. 15 tablet 0  . mometasone (NASONEX) 50 MCG/ACT nasal spray Place 2 sprays into the nose daily as needed (nasal congestion).     Marland Kitchen olopatadine (PATANOL) 0.1 % ophthalmic solution Place 1 drop into both eyes 2 (two) times daily.     Marland Kitchen Propylene Glycol (SYSTANE BALANCE OP) Place 1 drop daily as needed into both eyes (dryness).     . ranitidine (ZANTAC) 150 MG capsule Take 150 mg by mouth every evening.    . solifenacin (VESICARE) 5 MG tablet Take 5 mg by mouth daily.    Marland Kitchen trimethoprim-polymyxin b (POLYTRIM) ophthalmic solution Place 1 drop into the left eye 4 (four) times daily.  12   No current facility-administered medications on file prior to visit.     Cardiovascular studies:  EKG 03/27/2019: Sinus rhythm 51 bpm. Diffuse nonspecific T-abnormality.    Echocardiogram 05/24/2018: 1. Left ventricle cavity is normal in size. Normal global wall motion. Calculated EF  68%. 2. Left atrial cavity is moderate to severely dilated. 3. Mild calcification of the aortic valve annulus. 4. Mild to moderate mitral regurgitation. 5. Mild to moderate tricuspid regurgitation. Mild pulmonary hypertension with approx. PA syst. pressure of 33 mm of Hg.  Lexiscan stress test 2015: No ischemia  Recent labs: Results for Gelber,  LAURIEL ORRIS (MRN [id number]) as of 03/27/2019 08:18  Ref. Range 05/05/2018 10:34  Sodium Latest Ref Range: 135 - 145 mmol/L 137  Potassium Latest Ref Range: 3.5 - 5.1 mmol/L 4.0  Chloride Latest Ref Range: 98 - 111 mmol/L 106  CO2 Latest Ref Range: 22 - 32 mmol/L 21 (L)  Glucose Latest Ref Range: 70 - 99 mg/dL 88  BUN Latest Ref Range: 8 - 23 mg/dL 14  Creatinine Latest Ref Range: 0.44 - 1.00 mg/dL 1.37 (H)  Calcium Latest Ref Range: 8.9 - 10.3 mg/dL 9.2  Anion gap Latest Ref Range: 5 - 15  10  Alkaline Phosphatase Latest Ref Range: 38 - 126 U/L 89  Albumin Latest Ref Range: 3.5 - 5.0 g/dL 3.7  AST Latest Ref Range: 15 - 41 U/L 23  ALT Latest Ref Range: 0 - 44 U/L 12  Total Protein Latest Ref Range: 6.5 - 8.1 g/dL 7.2  Total Bilirubin Latest Ref Range: 0.3 - 1.2 mg/dL 0.9  GFR, Est Non African American Latest Ref Range: >60 mL/min 35 (L)  GFR, Est African American Latest Ref Range: >60 mL/min 40 (L)   Results for OYINDAMOLA, MUNTER (MRN [id number]) as of 03/27/2019 08:18  Ref. Range 05/05/2018 10:34  WBC Latest Ref Range: 4.0 - 10.5 K/uL 7.4  RBC Latest Ref Range: 3.87 - 5.11 MIL/uL 5.02  Hemoglobin Latest Ref Range: 12.0 - 15.0 g/dL 13.3  HCT Latest Ref Range: 36.0 - 46.0 % 43.4  MCV Latest Ref Range: 80.0 - 100.0 fL 86.5  MCH Latest Ref Range: 26.0 - 34.0 pg 26.5  MCHC Latest Ref Range: 30.0 - 36.0 g/dL 30.6  RDW Latest Ref Range: 11.5 - 15.5 % 14.9  Platelets Latest Ref Range: 150 - 400 K/uL 275  nRBC Latest Ref Range: 0.0 - 0.2 % 0.0   Review of Systems  Constitution: Negative for decreased appetite, malaise/fatigue, weight  gain and weight loss.  HENT: Negative for congestion.   Eyes: Negative for visual disturbance.  Cardiovascular: Negative for chest pain, dyspnea on exertion, leg swelling, palpitations and syncope.  Respiratory: Negative for cough.   Endocrine: Negative for cold intolerance.  Hematologic/Lymphatic: Does not bruise/bleed easily.  Skin: Negative for itching and rash.  Musculoskeletal: Negative for myalgias.  Gastrointestinal: Negative for abdominal pain, nausea and vomiting.  Genitourinary: Negative for dysuria.  Neurological: Negative for dizziness and weakness.  Psychiatric/Behavioral: The patient is not nervous/anxious.   All other systems reviewed and are negative.        Vitals:   03/27/19 1007 03/27/19 1018  BP: (!) 156/58 136/62  Pulse: (!) 56 (!) 52  Temp: (!) 97.3 F (36.3 C)   SpO2: 99%      Body mass index is 25.13 kg/m. Filed Weights   03/27/19 1007  Weight: 133 lb (60.3 kg)     Objective:   Physical Exam  Constitutional: She is oriented to person, place, and time. She appears well-developed and well-nourished. No distress.  HENT:  Head: Normocephalic and atraumatic.  Eyes: Pupils are equal, round, and reactive to light. Conjunctivae are normal.  Neck: No JVD present.  Cardiovascular: Normal rate, regular rhythm and intact distal pulses.  Murmur heard. High-pitched blowing holosystolic murmur is present with a grade of 2/6 at the apex. Pulmonary/Chest: Effort normal and breath sounds normal. She has no wheezes. She has no rales.  Abdominal: Soft. Bowel sounds are normal. There is no rebound.  Musculoskeletal:        General: No edema.  Lymphadenopathy:  She has no cervical adenopathy.  Neurological: She is alert and oriented to person, place, and time. No cranial nerve deficit.  Skin: Skin is warm and dry.  Psychiatric: She has a normal mood and affect.  Nursing note and vitals reviewed.         Assessment & Recommendations:   85 year old  African-American female with hypertension, hyperlipidemia, paroxysmal atrial flutter, mild to moderate MR, TR, mild PH, CKD stage 3.  Atrial flutter: Paroxysmal. In sinus rhythm today. CHA2DS2VAsc score 4, annual stroke risk 5%. She is currently on eliquis 2.5 mg bid. With her age >72, weight just over 60 Kg, and Cr just under 1.5, I think this is reasonable to avoid bleeding risk. She is on low dose amiodarone. Will obtain recent blood work from PCP and check CMP and TSH-if not checked by them.   Mild to moderate MR, TR, mild PH: Clinically asymptomatic.  Repeat echocardiogram in 02/2020  Hypertension: Controlled.  Hyperlipidemia: Conitnue Lipitor 40 mg daily. Will check lipid panel, if not checked by PCP.     Nigel Mormon, MD [facility] [place] Pager: [phone] Office: [phone] If no answer Cell [phone]

## 2019-03-27 ENCOUNTER — Ambulatory Visit (INDEPENDENT_AMBULATORY_CARE_PROVIDER_SITE_OTHER): Payer: HMO | Admitting: Cardiology

## 2019-03-27 ENCOUNTER — Other Ambulatory Visit: Payer: Self-pay

## 2019-03-27 ENCOUNTER — Encounter: Payer: Self-pay | Admitting: Cardiology

## 2019-03-27 VITALS — BP 136/62 | HR 52 | Temp 97.3°F | Ht 61.0 in | Wt 133.0 lb

## 2019-03-27 DIAGNOSIS — I340 Nonrheumatic mitral (valve) insufficiency: Secondary | ICD-10-CM

## 2019-03-27 DIAGNOSIS — I2729 Other secondary pulmonary hypertension: Secondary | ICD-10-CM | POA: Diagnosis not present

## 2019-03-27 DIAGNOSIS — I4892 Unspecified atrial flutter: Secondary | ICD-10-CM | POA: Diagnosis not present

## 2019-03-27 DIAGNOSIS — E782 Mixed hyperlipidemia: Secondary | ICD-10-CM | POA: Diagnosis not present

## 2019-03-27 DIAGNOSIS — I10 Essential (primary) hypertension: Secondary | ICD-10-CM | POA: Diagnosis not present

## 2019-03-27 DIAGNOSIS — I361 Nonrheumatic tricuspid (valve) insufficiency: Secondary | ICD-10-CM | POA: Diagnosis not present

## 2019-04-06 DIAGNOSIS — R202 Paresthesia of skin: Secondary | ICD-10-CM | POA: Diagnosis not present

## 2019-04-06 DIAGNOSIS — R200 Anesthesia of skin: Secondary | ICD-10-CM | POA: Diagnosis not present

## 2019-04-06 DIAGNOSIS — M792 Neuralgia and neuritis, unspecified: Secondary | ICD-10-CM | POA: Diagnosis not present

## 2019-04-17 ENCOUNTER — Other Ambulatory Visit: Payer: Self-pay

## 2019-04-17 ENCOUNTER — Encounter (INDEPENDENT_AMBULATORY_CARE_PROVIDER_SITE_OTHER): Payer: HMO | Admitting: Ophthalmology

## 2019-04-17 DIAGNOSIS — H34812 Central retinal vein occlusion, left eye, with macular edema: Secondary | ICD-10-CM

## 2019-04-17 DIAGNOSIS — H35033 Hypertensive retinopathy, bilateral: Secondary | ICD-10-CM

## 2019-04-17 DIAGNOSIS — H43813 Vitreous degeneration, bilateral: Secondary | ICD-10-CM

## 2019-04-17 DIAGNOSIS — I10 Essential (primary) hypertension: Secondary | ICD-10-CM | POA: Diagnosis not present

## 2019-04-26 DIAGNOSIS — H35033 Hypertensive retinopathy, bilateral: Secondary | ICD-10-CM | POA: Diagnosis not present

## 2019-04-26 DIAGNOSIS — Z961 Presence of intraocular lens: Secondary | ICD-10-CM | POA: Diagnosis not present

## 2019-04-26 DIAGNOSIS — H3589 Other specified retinal disorders: Secondary | ICD-10-CM | POA: Diagnosis not present

## 2019-04-26 DIAGNOSIS — H538 Other visual disturbances: Secondary | ICD-10-CM | POA: Diagnosis not present

## 2019-04-26 DIAGNOSIS — H35352 Cystoid macular degeneration, left eye: Secondary | ICD-10-CM | POA: Diagnosis not present

## 2019-05-04 ENCOUNTER — Other Ambulatory Visit: Payer: Self-pay | Admitting: Cardiology

## 2019-05-15 ENCOUNTER — Encounter (INDEPENDENT_AMBULATORY_CARE_PROVIDER_SITE_OTHER): Payer: HMO | Admitting: Ophthalmology

## 2019-05-19 ENCOUNTER — Other Ambulatory Visit: Payer: Self-pay | Admitting: Cardiology

## 2019-05-22 NOTE — Telephone Encounter (Signed)
Please fill was a CV patient

## 2019-05-25 ENCOUNTER — Encounter (INDEPENDENT_AMBULATORY_CARE_PROVIDER_SITE_OTHER): Payer: HMO | Admitting: Ophthalmology

## 2019-05-25 ENCOUNTER — Other Ambulatory Visit: Payer: Self-pay

## 2019-05-25 DIAGNOSIS — H43813 Vitreous degeneration, bilateral: Secondary | ICD-10-CM | POA: Diagnosis not present

## 2019-05-25 DIAGNOSIS — H35033 Hypertensive retinopathy, bilateral: Secondary | ICD-10-CM | POA: Diagnosis not present

## 2019-05-25 DIAGNOSIS — I10 Essential (primary) hypertension: Secondary | ICD-10-CM | POA: Diagnosis not present

## 2019-05-25 DIAGNOSIS — H34812 Central retinal vein occlusion, left eye, with macular edema: Secondary | ICD-10-CM | POA: Diagnosis not present

## 2019-06-02 ENCOUNTER — Ambulatory Visit: Payer: HMO | Admitting: Podiatry

## 2019-06-14 ENCOUNTER — Other Ambulatory Visit: Payer: Self-pay | Admitting: Cardiology

## 2019-06-20 DIAGNOSIS — Z961 Presence of intraocular lens: Secondary | ICD-10-CM | POA: Diagnosis not present

## 2019-06-20 DIAGNOSIS — H3589 Other specified retinal disorders: Secondary | ICD-10-CM | POA: Diagnosis not present

## 2019-06-20 DIAGNOSIS — H35033 Hypertensive retinopathy, bilateral: Secondary | ICD-10-CM | POA: Diagnosis not present

## 2019-06-20 DIAGNOSIS — H16223 Keratoconjunctivitis sicca, not specified as Sjogren's, bilateral: Secondary | ICD-10-CM | POA: Diagnosis not present

## 2019-06-20 DIAGNOSIS — H35352 Cystoid macular degeneration, left eye: Secondary | ICD-10-CM | POA: Diagnosis not present

## 2019-06-22 ENCOUNTER — Encounter (INDEPENDENT_AMBULATORY_CARE_PROVIDER_SITE_OTHER): Payer: HMO | Admitting: Ophthalmology

## 2019-06-22 DIAGNOSIS — H43813 Vitreous degeneration, bilateral: Secondary | ICD-10-CM | POA: Diagnosis not present

## 2019-06-22 DIAGNOSIS — H34812 Central retinal vein occlusion, left eye, with macular edema: Secondary | ICD-10-CM | POA: Diagnosis not present

## 2019-06-22 DIAGNOSIS — H35033 Hypertensive retinopathy, bilateral: Secondary | ICD-10-CM | POA: Diagnosis not present

## 2019-06-22 DIAGNOSIS — I10 Essential (primary) hypertension: Secondary | ICD-10-CM

## 2019-07-14 ENCOUNTER — Other Ambulatory Visit: Payer: Self-pay | Admitting: Cardiology

## 2019-07-17 ENCOUNTER — Ambulatory Visit: Payer: Self-pay

## 2019-07-19 DIAGNOSIS — K2100 Gastro-esophageal reflux disease with esophagitis, without bleeding: Secondary | ICD-10-CM | POA: Diagnosis not present

## 2019-07-19 DIAGNOSIS — E7800 Pure hypercholesterolemia, unspecified: Secondary | ICD-10-CM | POA: Diagnosis not present

## 2019-07-19 DIAGNOSIS — Z1231 Encounter for screening mammogram for malignant neoplasm of breast: Secondary | ICD-10-CM | POA: Diagnosis not present

## 2019-07-19 DIAGNOSIS — M810 Age-related osteoporosis without current pathological fracture: Secondary | ICD-10-CM | POA: Diagnosis not present

## 2019-07-20 ENCOUNTER — Encounter (INDEPENDENT_AMBULATORY_CARE_PROVIDER_SITE_OTHER): Payer: HMO | Admitting: Ophthalmology

## 2019-07-20 DIAGNOSIS — H35033 Hypertensive retinopathy, bilateral: Secondary | ICD-10-CM | POA: Diagnosis not present

## 2019-07-20 DIAGNOSIS — I10 Essential (primary) hypertension: Secondary | ICD-10-CM | POA: Diagnosis not present

## 2019-07-20 DIAGNOSIS — H43813 Vitreous degeneration, bilateral: Secondary | ICD-10-CM

## 2019-07-20 DIAGNOSIS — H34812 Central retinal vein occlusion, left eye, with macular edema: Secondary | ICD-10-CM | POA: Diagnosis not present

## 2019-07-26 DIAGNOSIS — M85851 Other specified disorders of bone density and structure, right thigh: Secondary | ICD-10-CM | POA: Diagnosis not present

## 2019-07-26 DIAGNOSIS — M810 Age-related osteoporosis without current pathological fracture: Secondary | ICD-10-CM | POA: Diagnosis not present

## 2019-07-26 DIAGNOSIS — M85852 Other specified disorders of bone density and structure, left thigh: Secondary | ICD-10-CM | POA: Diagnosis not present

## 2019-07-26 DIAGNOSIS — Z1231 Encounter for screening mammogram for malignant neoplasm of breast: Secondary | ICD-10-CM | POA: Diagnosis not present

## 2019-07-27 ENCOUNTER — Other Ambulatory Visit: Payer: Self-pay

## 2019-07-27 ENCOUNTER — Other Ambulatory Visit: Payer: Self-pay | Admitting: Internal Medicine

## 2019-07-27 NOTE — Patient Outreach (Signed)
  [place] [facility]) Care Management Chronic Special Needs Program    07/27/2019  Name: Kerry Barnes, DOB: 08/28/1933  MRN: [id number]   [name] is enrolled in a chronic special needs plan for Heart Failure. RNCM called to complete health risk assessment and update individualized care plan. No answer. Unable to leave message.    Plan: Chronic care management coordinator will attempt outreach within 1-2 weeks.  Thea Silversmith, RN, MSN, [place] [phone]

## 2019-08-04 ENCOUNTER — Other Ambulatory Visit: Payer: Self-pay

## 2019-08-04 NOTE — Patient Outreach (Signed)
  [place] [facility]) Care Management Chronic Special Needs Program    08/04/2019  Name: Kerry Barnes, DOB: 02-07-1934  MRN: [id number]   [name] is enrolled in a chronic special needs plan for Heart Failure. RNCM called to follow up, complete health risk assessment and update individualized care plan. No answer. Unable to leave message.  Plan: Chronic care management coordinator will attempt outreach within 2-3 weeks.  Thea Silversmith, RN, MSN, [place] [phone]

## 2019-08-10 ENCOUNTER — Ambulatory Visit: Payer: Self-pay

## 2019-08-13 ENCOUNTER — Other Ambulatory Visit: Payer: Self-pay | Admitting: Cardiology

## 2019-08-13 DIAGNOSIS — I4892 Unspecified atrial flutter: Secondary | ICD-10-CM

## 2019-08-14 NOTE — Telephone Encounter (Signed)
Refill request

## 2019-08-14 NOTE — Telephone Encounter (Signed)
MP patient. Can you guys check first before sending messages

## 2019-08-15 NOTE — Telephone Encounter (Signed)
Please order CMP, TSH. Okay to refill.

## 2019-08-15 NOTE — Telephone Encounter (Signed)
Refill request

## 2019-08-17 ENCOUNTER — Other Ambulatory Visit: Payer: Self-pay

## 2019-08-17 ENCOUNTER — Encounter (INDEPENDENT_AMBULATORY_CARE_PROVIDER_SITE_OTHER): Payer: HMO | Admitting: Ophthalmology

## 2019-08-17 DIAGNOSIS — I10 Essential (primary) hypertension: Secondary | ICD-10-CM | POA: Diagnosis not present

## 2019-08-17 DIAGNOSIS — H34812 Central retinal vein occlusion, left eye, with macular edema: Secondary | ICD-10-CM | POA: Diagnosis not present

## 2019-08-17 DIAGNOSIS — H43813 Vitreous degeneration, bilateral: Secondary | ICD-10-CM

## 2019-08-17 DIAGNOSIS — H35033 Hypertensive retinopathy, bilateral: Secondary | ICD-10-CM | POA: Diagnosis not present

## 2019-08-17 DIAGNOSIS — I4892 Unspecified atrial flutter: Secondary | ICD-10-CM

## 2019-08-17 NOTE — Telephone Encounter (Signed)
Ok done

## 2019-08-18 ENCOUNTER — Other Ambulatory Visit: Payer: Self-pay

## 2019-08-18 NOTE — Patient Outreach (Signed)
[place] [facility]) Care Management Chronic Special Needs Program  08/18/2019  Name: Kerry Barnes DOB: 1933/11/02  MRN: [id number]  [name] is enrolled in a chronic special needs plan for  Heart Failure. A completed health risk assessment has not been received from the client. RNCM called to assist client with completing health risk assessment and updated individualized care plan.   The client's individualized care plan was developed based on available data.   Goals Addressed            This Visit's Progress   . Client understands the importance of follow-up with providers by attending scheduled visits   On track    Unable to speak with client: Care plan updated based on available data: No evidence of missed appointments noted.  Goal renewed: It is important to follow up with your providers for recommended exams, procedures and prescription refills.    . Client verbalize knowledge of Heart Failure disease self management within the next 6-9 months.       Heart failure self-management actions: Know signs and symptoms of congestive heart failure exacerbation. Know when to call the doctor to who to call. Verbalize how fluid intake can affect congestive heart failure. Eat healthy and monitor salt intake. Visit your primary care or cardiologist as scheduled. Verbalize the how daily weights can help with management of heart failure.     . Client will not report change from baseline and no repeated symptoms of  mini-stroke with in the next 6 months.        Know the signs/symptoms of a stroke: F.A.S.T. FACE DROOPING Does one side of the face droop or is it numb? Ask the person to smile. ARM WEAKNESS Is one arm weak or numb? Ask the person to raise both arms. Does one arm drift downward? SPEECH DIFFICULTY Is speech slurred, are they unable to speak, or are they hard to understand? Ask the person to repeat a simple sentence, like "the sky is blue." Is the  sentence repeated correctly? TIME TO CALL 9-1-1 If the person shows any of these symptoms, even if the symptoms go away, call 9-1-1 and get them to the hospital immediately.    . Client will report no worsening of symptoms of Atrial Fibrillation within the next 6 months       Please follow up with your provider as scheduled. Please take medications as prescribed. Signs/symptoms of worsening: racing or irregular heart beat that may be uncomfortable shortness of breath with our without chest pain weakness; dizziness. Review the atrial fibrillation action plan located in your [employer] Calendar. Call your CSNP care management coordinator if you have any questions. [phone]. Mailed education: what is atrial fibrillation. Please review and call if you have any questions.    . Client will report no worsening of symptoms related to heart disease within the next 6 months.       Please attend your provider visits as scheduled. Please take your medications as prescribed. Please review educational material "Heart disease in diabetics" and call if you have any questions.    . Client will verbalize knowledge of diabetes self-management as evidenced by Hgb A1C <7 or as defined by provider.       Upcoming appointment with Dr. [name] March 2021.  Diabetes self management actions:  Glucose monitoring per provider recommendations  Eat Healthy  Check feet daily  Visit provider every 3-6 months as directed  Hbg A1C level every 3-6 months.  Eye Exam  yearly  Ask your doctor, "What is my Target A1C goal?"  Ask your doctor, "What is my Target blood sugar range?"    . Client will verbalize knowledge of self management of Hypertension as evidences by BP reading of 140/90 or less; or as defined by provider   No change    Per chart: Blood pressure 01/16/2019 160/70; 04/06/2019 152/64  Goal Renewed:  Per chart: Blood pressure 07/19/2019 148/72. Client to follow up with cardiologist.  Please  follow up with your doctor as scheduled. Take your medications as prescribed by your doctor. Ask your doctor "what is my target blood pressure range". Monitor your blood pressure and take results to your doctor's appointment. If you do not have a blood pressure monitor, please call your CSNP nurse care management coordinator at [phone], for assistance.  Monitor the amount of salt you are eating. Continue to exercise as tolerated and remain active. Please call your cardiologist with any questions concerns regarding your home blood pressure readings.     . COMPLETED: Decrease inpatient Heart Failure admissions/ readmissions with in the year       No admissions for heart failure year 2020 noted.    . COMPLETED: Decrease the use of hospital emergency department related to heart failure within the next year       No ED visits for heart failure 2020 noted.    . Maintain timely refills of Heart Failure medication as prescribed within the year    On track    Unable to speak with client- no evidence that client is having difficulty with obtaining medications noted.  Goal renewed 2021 Please call your doctor and/or your CSNP care management coordinator if you have any difficulties obtaining your medications [phone]    . Obtain annual  Lipid Profile, LDL-C   On track    Done 07/14/2018; 01/16/2019  Goal renewed 2021 It is important to follow up with your provider as scheduled for recommended labs. Done 07/19/2019.    Marland Kitchen Visit Primary Care Provider or Cardiologist at least 2 times per year   On track    It is important to follow up with your provider for recommended labs, procedures. Please schedule an annual wellness visit with your doctor if you do not already have one scheduled.    . Visit Primary Care Provider or Cardiologist at least 2 times per year   On track    Done: Primary care provider seen 04/06/2019; 01/16/2019; 07/14/2018. Annual wellness exam completed 01/14/3029.  Goal renewed  2021: Last office visit with Primary care provider 07/19/2019 It is important to follow up with your provider for your provider visit as scheduled for recommended, labs, procedures.       Plan: Send unsuccessful outreach letter with a copy of individualized care plan to client; Send individualized care plan to provider; send educational material. Chronic care management coordinator will attempt outreach per tier level within the next 6 months.   Thea Silversmith, RN, MSN, [place] [phone]

## 2019-09-21 ENCOUNTER — Encounter (INDEPENDENT_AMBULATORY_CARE_PROVIDER_SITE_OTHER): Payer: HMO | Admitting: Ophthalmology

## 2019-09-21 DIAGNOSIS — H35033 Hypertensive retinopathy, bilateral: Secondary | ICD-10-CM

## 2019-09-21 DIAGNOSIS — H43813 Vitreous degeneration, bilateral: Secondary | ICD-10-CM

## 2019-09-21 DIAGNOSIS — H34812 Central retinal vein occlusion, left eye, with macular edema: Secondary | ICD-10-CM | POA: Diagnosis not present

## 2019-09-21 DIAGNOSIS — I10 Essential (primary) hypertension: Secondary | ICD-10-CM

## 2019-10-26 ENCOUNTER — Other Ambulatory Visit: Payer: Self-pay

## 2019-10-26 ENCOUNTER — Encounter (INDEPENDENT_AMBULATORY_CARE_PROVIDER_SITE_OTHER): Payer: HMO | Admitting: Ophthalmology

## 2019-10-26 DIAGNOSIS — I10 Essential (primary) hypertension: Secondary | ICD-10-CM

## 2019-10-26 DIAGNOSIS — H34812 Central retinal vein occlusion, left eye, with macular edema: Secondary | ICD-10-CM | POA: Diagnosis not present

## 2019-10-26 DIAGNOSIS — H43813 Vitreous degeneration, bilateral: Secondary | ICD-10-CM | POA: Diagnosis not present

## 2019-10-26 DIAGNOSIS — H35033 Hypertensive retinopathy, bilateral: Secondary | ICD-10-CM

## 2019-11-29 ENCOUNTER — Encounter (INDEPENDENT_AMBULATORY_CARE_PROVIDER_SITE_OTHER): Payer: HMO | Admitting: Ophthalmology

## 2019-11-29 ENCOUNTER — Other Ambulatory Visit: Payer: Self-pay

## 2019-11-29 DIAGNOSIS — H35033 Hypertensive retinopathy, bilateral: Secondary | ICD-10-CM

## 2019-11-29 DIAGNOSIS — H43813 Vitreous degeneration, bilateral: Secondary | ICD-10-CM | POA: Diagnosis not present

## 2019-11-29 DIAGNOSIS — H34813 Central retinal vein occlusion, bilateral, with macular edema: Secondary | ICD-10-CM | POA: Diagnosis not present

## 2019-11-29 DIAGNOSIS — I10 Essential (primary) hypertension: Secondary | ICD-10-CM | POA: Diagnosis not present

## 2020-01-03 ENCOUNTER — Encounter (INDEPENDENT_AMBULATORY_CARE_PROVIDER_SITE_OTHER): Payer: HMO | Admitting: Ophthalmology

## 2020-01-10 ENCOUNTER — Other Ambulatory Visit: Payer: Self-pay | Admitting: Cardiology

## 2020-01-12 ENCOUNTER — Emergency Department (HOSPITAL_BASED_OUTPATIENT_CLINIC_OR_DEPARTMENT_OTHER): Payer: HMO

## 2020-01-12 ENCOUNTER — Encounter (HOSPITAL_COMMUNITY): Payer: Self-pay

## 2020-01-12 ENCOUNTER — Emergency Department (HOSPITAL_COMMUNITY)
Admission: EM | Admit: 2020-01-12 | Discharge: 2020-01-12 | Disposition: A | Discharge status: Home / Self Care | Payer: HMO | Attending: Emergency Medicine | Admitting: Emergency Medicine

## 2020-01-12 DIAGNOSIS — M79605 Pain in left leg: Secondary | ICD-10-CM | POA: Insufficient documentation

## 2020-01-12 DIAGNOSIS — E1122 Type 2 diabetes mellitus with diabetic chronic kidney disease: Secondary | ICD-10-CM | POA: Insufficient documentation

## 2020-01-12 DIAGNOSIS — I130 Hypertensive heart and chronic kidney disease with heart failure and stage 1 through stage 4 chronic kidney disease, or unspecified chronic kidney disease: Secondary | ICD-10-CM | POA: Insufficient documentation

## 2020-01-12 DIAGNOSIS — I959 Hypotension, unspecified: Secondary | ICD-10-CM | POA: Diagnosis not present

## 2020-01-12 DIAGNOSIS — J45901 Unspecified asthma with (acute) exacerbation: Secondary | ICD-10-CM | POA: Insufficient documentation

## 2020-01-12 DIAGNOSIS — M79604 Pain in right leg: Secondary | ICD-10-CM | POA: Diagnosis not present

## 2020-01-12 DIAGNOSIS — R5381 Other malaise: Secondary | ICD-10-CM | POA: Diagnosis not present

## 2020-01-12 DIAGNOSIS — Z79899 Other long term (current) drug therapy: Secondary | ICD-10-CM | POA: Diagnosis not present

## 2020-01-12 DIAGNOSIS — R52 Pain, unspecified: Secondary | ICD-10-CM | POA: Diagnosis not present

## 2020-01-12 DIAGNOSIS — R609 Edema, unspecified: Secondary | ICD-10-CM | POA: Diagnosis not present

## 2020-01-12 DIAGNOSIS — N183 Chronic kidney disease, stage 3 unspecified: Secondary | ICD-10-CM | POA: Insufficient documentation

## 2020-01-12 DIAGNOSIS — I5032 Chronic diastolic (congestive) heart failure: Secondary | ICD-10-CM | POA: Insufficient documentation

## 2020-01-12 DIAGNOSIS — R262 Difficulty in walking, not elsewhere classified: Secondary | ICD-10-CM | POA: Diagnosis not present

## 2020-01-12 LAB — BASIC METABOLIC PANEL
Anion gap: 12 (ref 5–15)
BUN: 17 mg/dL (ref 8–23)
CO2: 22 mmol/L (ref 22–32)
Calcium: 8.4 mg/dL — ABNORMAL LOW (ref 8.9–10.3)
Chloride: 101 mmol/L (ref 98–111)
Creatinine, Ser: 0.95 mg/dL (ref 0.44–1.00)
GFR calc Af Amer: >60 mL/min (ref >60)
GFR calc non Af Amer: 55 mL/min — ABNORMAL LOW (ref >60)
Glucose, Bld: 97 mg/dL (ref 70–99)
Potassium: 4 mmol/L (ref 3.5–5.1)
Sodium: 135 mmol/L (ref 135–145)

## 2020-01-12 LAB — CBC
HCT: 35.1 % — ABNORMAL LOW (ref 36.0–46.0)
Hemoglobin: 11.5 g/dL — ABNORMAL LOW (ref 12.0–15.0)
MCH: 26.6 pg (ref 26.0–34.0)
MCHC: 32.8 g/dL (ref 30.0–36.0)
MCV: 81.1 fL (ref 80.0–100.0)
Platelets: 373 10*3/uL (ref 150–400)
RBC: 4.33 MIL/uL (ref 3.87–5.11)
RDW: 19.4 % — ABNORMAL HIGH (ref 11.5–15.5)
WBC: 13.1 10*3/uL — ABNORMAL HIGH (ref 4.0–10.5)
nRBC: 0 % (ref 0.0–0.2)

## 2020-01-12 MED ORDER — GABAPENTIN 100 MG PO CAPS: 100.0000 mg | ORAL_CAPSULE | Freq: Three times a day (TID) | ORAL | 0 refills | Status: AC

## 2020-01-12 MED ORDER — ACETAMINOPHEN 500 MG PO TABS
1000.0000 mg | ORAL_TABLET | Freq: Once | ORAL | Status: AC
  Administered 2020-01-12: 1000 mg via ORAL
  Filled 2020-01-12: qty 2

## 2020-01-12 NOTE — ED Triage Notes (Signed)
Pt BIBA from home. Pt was dx with neuopathy, has pain in legs. Pt states she has had difficulty walking for several months. Pt has had a decreased appetite as well. Pt denies N/V/D.  VSS with EMS 102/56 62 96% RA 110 FSBG 98.6

## 2020-01-12 NOTE — Progress Notes (Signed)
Lower extremity venous has been completed.   Preliminary results in CV Proc.   Kerry Barnes 01/12/2020 4:26 PM

## 2020-01-12 NOTE — ED Provider Notes (Signed)
[place] DEPT Provider Note   CSN: [id number] Arrival date & time: 01/12/20  1401     History Chief Complaint  Patient presents with  . Leg Pain    Kerry Barnes is a 86 y.o. female.  86 yo F with a chief complaints of bilateral leg pain.  This is been an issue going on for at least 3 years she thinks.  Worse with ambulation hurts on both legs all the way down.  She has been seen by her family doctor for this and told that it is neuropathy.  She is on medication for it but she is not sure which one.  Feels it is gotten worse over the past few weeks.  She has some left lower extremity swelling that she thinks been going on for about 3 months.  Has follow-up with her family doctor in a couple months.  Denies chest pain or shortness of breath.  Denies redness or fever.  Denies trauma.  The history is provided by the patient.  Leg Pain Location:  Leg Time since incident:  36 months Injury: no   Pain details:    Quality:  Aching   Radiates to:  Does not radiate   Severity:  Moderate   Onset quality:  Gradual   Duration:  36 months   Timing:  Constant   Progression:  Worsening Chronicity:  New Dislocation: no   Prior injury to area:  No Relieved by:  Nothing Worsened by:  Bearing weight Ineffective treatments:  None tried Associated symptoms: no fever        Past Medical History:  Diagnosis Date  . Anxiety   . Arthritis   . Asthma   . Atypical atrial flutter ([place])   . Cataract   . Chronic diastolic CHF (congestive heart failure) ([place])   . Fibromyalgia   . GERD (gastroesophageal reflux disease)   . History of blood transfusion   . Hyperlipemia   . Hypertension   . IBS (irritable bowel syndrome)   . Seasonal allergies   . Stroke [facility]) 2013   TIA  . Tubular adenoma of colon   . Type 2 diabetes mellitus [facility])     Patient Active Problem List   Diagnosis Date Noted  . Nonrheumatic tricuspid valve regurgitation 03/27/2019  .  Other secondary pulmonary hypertension ([place]) 03/27/2019  . Laboratory examination 08/29/2018  . Chest pressure 04/23/2018  . Abdominal pain 04/23/2018  . Atypical chest pain   . Epigastric abdominal pain   . AKI (acute kidney injury) ([place]) 04/26/2017  . Chronic renal insufficiency, stage III (moderate) ([place]) 04/17/2016  . Chronic anticoagulation 02/05/2016  . Chest pain   . Acute kidney injury superimposed on CKD ([place])   . Atrial flutter, paroxysmal ([place]) 01/31/2016  . Asthma with acute exacerbation 09/10/2015  . Difficulty swallowing 09/10/2015  . History of acute bronchitis 09/10/2015  . Moderate persistent asthma 09/10/2015  . Sore throat 09/10/2015  . Left carotid bruit 04/23/2015  . Leg pain 10/10/2013  . Stroke ([place]) 06/16/2011  . Chronic diastolic heart failure ([place]) 11/06/2009  . PULMONARY HYPERTENSION, SECONDARY 07/04/2009  . ARM NUMBNESS 07/04/2009  . Type 2 diabetes with stage 3 chronic kidney disease GFR 30-59 ([place]) 07/03/2009  . Mixed hyperlipidemia 07/03/2009  . MITRAL REGURGITATION, MILD 07/03/2009  . Essential hypertension 07/03/2009  . Gastroparesis 07/03/2009  . DIVERTICULOSIS, COLON, WITH HEMORRHAGE 07/03/2009  . CONSTIPATION 07/03/2009  . IBS 07/03/2009  . ARTHRITIS 07/03/2009  . MIGRAINES, HX OF 07/03/2009  .  History of disease 07/03/2009  . EUSTACHIAN TUBE DYSFUNCTION 04/04/2007  . ALLERGIC RHINITIS 04/04/2007  . ASTHMA 04/04/2007  . ESOPHAGITIS, REFLUX 04/04/2007  . Asthma 04/04/2007    Past Surgical History:  Procedure Laterality Date  . ANKLE FRACTURE SURGERY    . CARDIOVERSION N/A 02/04/2016   Procedure: CARDIOVERSION;  Surgeon: Sueanne Margarita, MD;  Location: [facility] ENDOSCOPY;  Service: Cardiovascular;  Laterality: N/A;  . CARDIOVERSION N/A 05/01/2016   Procedure: CARDIOVERSION;  Surgeon: Fay Records, MD;  Location: [facility] ENDOSCOPY;  Service: Cardiovascular;  Laterality: N/A;  . CARDIOVERSION N/A 04/25/2018   Procedure: CARDIOVERSION;  Surgeon:  [name], MD;  Location: [place];  Service: Cardiovascular;  Laterality: N/A;  . CHOLECYSTECTOMY    . TEE WITHOUT CARDIOVERSION N/A 02/04/2016   Procedure: TRANSESOPHAGEAL ECHOCARDIOGRAM (TEE);  Surgeon: Sueanne Margarita, MD;  Location: [facility] ENDOSCOPY;  Service: Cardiovascular;  Laterality: N/A;  . TUBAL LIGATION    . WRIST FRACTURE SURGERY       OB History   No obstetric history on file.     Family History  Problem Relation Age of Onset  . Diabetes Mother   . Hypertension Mother   . Diabetes Father   . Diabetes Sister   . Diabetes Brother   . Hypertension Brother   . Colon cancer Neg Hx   . Stomach cancer Neg Hx     Social History   Tobacco Use  . Smoking status: Never Smoker  . Smokeless tobacco: Never Used  Vaping Use  . Vaping Use: Never used  Substance Use Topics  . Alcohol use: No    Alcohol/week: 0.0 standard drinks  . Drug use: No    Home Medications Prior to Admission medications   Medication Sig Start Date End Date Taking? Authorizing Provider  acetaminophen (TYLENOL) 325 MG tablet Take 650 mg by mouth every 6 (six) hours as needed.    [provider]  albuterol (PROVENTIL HFA;VENTOLIN HFA) 108 (90 BASE) MCG/ACT inhaler Inhale into the lungs every 6 (six) hours as needed for wheezing or shortness of breath.    [provider]  amiodarone (PACERONE) 200 MG tablet TAKE 1 TABLET BY MOUTH TWICE DAILY 08/15/19   Patwardhan, Reynold Bowen, MD  amLODipine (NORVASC) 5 MG tablet  10/17/18   [provider]  atorvastatin (LIPITOR) 40 MG tablet Take 40 mg by mouth daily.    [provider]  cycloSPORINE (RESTASIS) 0.05 % ophthalmic emulsion Apply 1 drop to eye daily. 07/07/18   [provider]  ELIQUIS 2.5 MG TABS tablet TAKE 1 TABLET(2.5 MG) BY MOUTH TWICE DAILY 01/10/20   Patwardhan, Manish J, MD  EPINEPHrine (EPI-PEN) 0.3 mg/0.3 mL SOAJ injection Inject 0.3 mg once as needed into the muscle (severe allergic reaction).     [provider]  gabapentin (NEURONTIN) 100 MG capsule Take 1 capsule (100 mg total) by mouth 3 (three) times daily. 01/12/20   Deno Etienne, DO  JANUVIA 50 MG tablet  10/13/18   [provider]  levalbuterol Penne Lash HFA) 45 MCG/ACT inhaler Inhale into the lungs.    [provider]  LINZESS 290 MCG CAPS capsule TAKE 1 CAPSULE BY MOUTH DAILY BEFORE BREAKFAST 07/27/19   Pyrtle, Lajuan Lines, MD  methocarbamol (ROBAXIN) 500 MG tablet Take 1 tablet (500 mg total) every 6 (six) hours as needed by mouth for muscle spasms. 04/27/17   Geradine Girt, DO  mometasone (NASONEX) 50 MCG/ACT nasal spray Place 2 sprays into the nose daily as  needed (nasal congestion).     [provider]  olopatadine (PATANOL) 0.1 % ophthalmic solution Place 1 drop into both eyes 2 (two) times daily.     [provider]  Propylene Glycol (SYSTANE BALANCE OP) Place 1 drop daily as needed into both eyes (dryness).     [provider]  ranitidine (ZANTAC) 150 MG capsule Take 150 mg by mouth every evening.    [provider]  solifenacin (VESICARE) 5 MG tablet Take 5 mg by mouth daily. 09/02/17   [provider]  trimethoprim-polymyxin b (POLYTRIM) ophthalmic solution Place 1 drop into the left eye 4 (four) times daily. 02/03/18   [provider]    Allergies    Codeine, Metformin, Amoxicillin, Biaxin [clarithromycin], Ciprofloxacin, and Penicillins  Review of Systems   Review of Systems  Constitutional: Negative for chills and fever.  HENT: Negative for congestion and rhinorrhea.   Eyes: Negative for redness and visual disturbance.  Respiratory: Negative for shortness of breath and wheezing.   Cardiovascular: Negative for chest pain and palpitations.  Gastrointestinal: Negative for nausea and vomiting.  Genitourinary: Negative for dysuria and urgency.  Musculoskeletal: Positive for myalgias. Negative for arthralgias.  Skin: Negative for pallor and wound.    Neurological: Negative for dizziness and headaches.    Physical Exam Updated Vital Signs BP 124/73 (BP Location: Left Arm)   Pulse 63   Temp 98.5 F (36.9 C) (Oral)   Resp 19   SpO2 95%   Physical Exam Vitals and nursing note reviewed.  Constitutional:      General: She is not in acute distress.    Appearance: She is well-developed. She is not diaphoretic.  HENT:     Head: Normocephalic and atraumatic.  Eyes:     Pupils: Pupils are equal, round, and reactive to light.  Cardiovascular:     Rate and Rhythm: Normal rate and regular rhythm.     Heart sounds: No murmur heard.  No friction rub. No gallop.   Pulmonary:     Effort: Pulmonary effort is normal.     Breath sounds: No wheezing or rales.  Abdominal:     General: There is no distension.     Palpations: Abdomen is soft.     Tenderness: There is no abdominal tenderness.  Musculoskeletal:        General: No tenderness.     Cervical back: Normal range of motion and neck supple.     Right lower leg: No edema.     Left lower leg: Edema (2+ to the thigh) present.     Comments: PMS intact distally  Skin:    General: Skin is warm and dry.  Neurological:     Mental Status: She is alert and oriented to person, place, and time.  Psychiatric:        Behavior: Behavior normal.     ED Results / Procedures / Treatments   Labs (all labs ordered are listed, but only abnormal results are displayed) Labs Reviewed  BASIC METABOLIC PANEL - Abnormal; Notable for the following components:      Result Value   Calcium 8.4 (*)    GFR calc non Af Amer 55 (*)    All other components within normal limits  CBC - Abnormal; Notable for the following components:   WBC 13.1 (*)    Hemoglobin 11.5 (*)    HCT 35.1 (*)    RDW 19.4 (*)    All other components within normal limits  EKG None  Radiology VAS [place] LOWER EXTREMITY VENOUS (DVT) (ONLY [facility] & [facility])  Result Date: 01/12/2020  Lower Venous DVTStudy Indications: Edema.  Comparison  Study: no prior Performing Technologist: Abram Sander RVS  Examination Guidelines: A complete evaluation includes B-mode imaging, spectral Doppler, color Doppler, and power Doppler as needed of all accessible portions of each vessel. Bilateral testing is considered an integral part of a complete examination. Limited examinations for reoccurring indications may be performed as noted. The reflux portion of the exam is performed with the patient in reverse Trendelenburg.  +-----+---------------+---------+-----------+----------+--------------+ RIGHTCompressibilityPhasicitySpontaneityPropertiesThrombus Aging +-----+---------------+---------+-----------+----------+--------------+ CFV  Full           Yes      Yes                                 +-----+---------------+---------+-----------+----------+--------------+   +---------+---------------+---------+-----------+----------+--------------+ LEFT     CompressibilityPhasicitySpontaneityPropertiesThrombus Aging +---------+---------------+---------+-----------+----------+--------------+ CFV      Full           Yes      Yes                                 +---------+---------------+---------+-----------+----------+--------------+ SFJ      Full                                                        +---------+---------------+---------+-----------+----------+--------------+ FV Prox  Full                                                        +---------+---------------+---------+-----------+----------+--------------+ FV Mid   Full                                                        +---------+---------------+---------+-----------+----------+--------------+ FV DistalFull                                                        +---------+---------------+---------+-----------+----------+--------------+ PFV      Full                                                         +---------+---------------+---------+-----------+----------+--------------+ POP      Full           Yes      Yes                                 +---------+---------------+---------+-----------+----------+--------------+ PTV      Full                                                        +---------+---------------+---------+-----------+----------+--------------+  PERO     Full                                                        +---------+---------------+---------+-----------+----------+--------------+     Summary: RIGHT: - No evidence of common femoral vein obstruction.  LEFT: - There is no evidence of deep vein thrombosis in the lower extremity.  - No cystic structure found in the popliteal fossa.  *See table(s) above for measurements and observations.    Preliminary     Procedures Procedures (including critical care time)  Medications Ordered in ED Medications  acetaminophen (TYLENOL) tablet 1,000 mg (1,000 mg Oral Given 01/12/20 1614)    ED Course  I have reviewed the triage vital signs and the nursing notes.  Pertinent labs & imaging results that were available during my care of the patient were reviewed by me and considered in my medical decision making (see chart for details).    MDM Rules/Calculators/A&P                          86 yo F with a chief complaint of bilateral leg pain.  Going on for about 3 years now.  Patient has been seen by her family doctor for this and told that she has peripheral neuropathy.  She takes a medicine for but not sure which one.  I reviewed the patient's records.  It appears that they have noted that she is on tramadol for pain.  Patient is unsure of this or not.  She does have unilateral edema that is unexplained.  Will obtain a DVT study though seems less likely to be DVT as she is listed to be on Eliquis.  [place] negative.  Trial of gabapentin.  PCP follow up.   5:45 PM:  I have discussed the diagnosis/risks/treatment options with  the patient and believe the pt to be eligible for discharge home to follow-up with PCP. We also discussed returning to the ED immediately if new or worsening sx occur. We discussed the sx which are most concerning (e.g., sudden worsening pain, fever, inability to tolerate by mouth) that necessitate immediate return. Medications administered to the patient during their visit and any new prescriptions provided to the patient are listed below.  Medications given during this visit Medications  acetaminophen (TYLENOL) tablet 1,000 mg (1,000 mg Oral Given 01/12/20 1614)     The patient appears reasonably screen and/or stabilized for discharge and I doubt any other medical condition or other [facility] requiring further screening, evaluation, or treatment in the ED at this time prior to discharge.   Final Clinical Impression(s) / ED Diagnoses Final diagnoses:  Bilateral leg pain    Rx / DC Orders ED Discharge Orders         Ordered    gabapentin (NEURONTIN) 100 MG capsule  3 times daily     Discontinue  Reprint     01/12/20 [place], [place], DO 01/12/20 1745

## 2020-01-12 NOTE — Discharge Instructions (Addendum)
Follow up with your family doc.  I have prescribed you a medication called gabapentin which typically is used to try and treat symptoms of peripheral neuropathy.  Based on my chart review I do not see that you have ever been on this before.  Please discuss with your family doctor because there may be some reason they did not want to start you on it.  Your ultrasound was negative for blood clot in the leg.

## 2020-01-15 ENCOUNTER — Inpatient Hospital Stay (HOSPITAL_COMMUNITY)
Admission: EM | Admit: 2020-01-15 | Discharge: 2020-01-25 | DRG: 314 | Disposition: A | Discharge status: Skilled Nursing Facility | Payer: HMO | Attending: Internal Medicine | Admitting: Internal Medicine

## 2020-01-15 ENCOUNTER — Observation Stay (HOSPITAL_COMMUNITY): Payer: HMO

## 2020-01-15 ENCOUNTER — Emergency Department (HOSPITAL_COMMUNITY): Payer: HMO

## 2020-01-15 ENCOUNTER — Encounter (HOSPITAL_COMMUNITY): Payer: Self-pay | Admitting: *Deleted

## 2020-01-15 DIAGNOSIS — D689 Coagulation defect, unspecified: Secondary | ICD-10-CM | POA: Diagnosis present

## 2020-01-15 DIAGNOSIS — R54 Age-related physical debility: Secondary | ICD-10-CM | POA: Diagnosis present

## 2020-01-15 DIAGNOSIS — R945 Abnormal results of liver function studies: Secondary | ICD-10-CM | POA: Diagnosis not present

## 2020-01-15 DIAGNOSIS — I5032 Chronic diastolic (congestive) heart failure: Secondary | ICD-10-CM | POA: Diagnosis present

## 2020-01-15 DIAGNOSIS — N2889 Other specified disorders of kidney and ureter: Secondary | ICD-10-CM | POA: Diagnosis present

## 2020-01-15 DIAGNOSIS — J90 Pleural effusion, not elsewhere classified: Secondary | ICD-10-CM | POA: Diagnosis not present

## 2020-01-15 DIAGNOSIS — I130 Hypertensive heart and chronic kidney disease with heart failure and stage 1 through stage 4 chronic kidney disease, or unspecified chronic kidney disease: Secondary | ICD-10-CM | POA: Diagnosis present

## 2020-01-15 DIAGNOSIS — L89153 Pressure ulcer of sacral region, stage 3: Secondary | ICD-10-CM | POA: Diagnosis not present

## 2020-01-15 DIAGNOSIS — L89159 Pressure ulcer of sacral region, unspecified stage: Secondary | ICD-10-CM | POA: Diagnosis present

## 2020-01-15 DIAGNOSIS — D72829 Elevated white blood cell count, unspecified: Secondary | ICD-10-CM | POA: Diagnosis not present

## 2020-01-15 DIAGNOSIS — Z7401 Bed confinement status: Secondary | ICD-10-CM

## 2020-01-15 DIAGNOSIS — N183 Chronic kidney disease, stage 3 unspecified: Secondary | ICD-10-CM | POA: Diagnosis present

## 2020-01-15 DIAGNOSIS — E1122 Type 2 diabetes mellitus with diabetic chronic kidney disease: Secondary | ICD-10-CM | POA: Diagnosis present

## 2020-01-15 DIAGNOSIS — I10 Essential (primary) hypertension: Secondary | ICD-10-CM | POA: Diagnosis not present

## 2020-01-15 DIAGNOSIS — E872 Acidosis: Secondary | ICD-10-CM | POA: Diagnosis present

## 2020-01-15 DIAGNOSIS — J189 Pneumonia, unspecified organism: Secondary | ICD-10-CM | POA: Diagnosis present

## 2020-01-15 DIAGNOSIS — E119 Type 2 diabetes mellitus without complications: Secondary | ICD-10-CM | POA: Diagnosis not present

## 2020-01-15 DIAGNOSIS — I5033 Acute on chronic diastolic (congestive) heart failure: Secondary | ICD-10-CM | POA: Diagnosis present

## 2020-01-15 DIAGNOSIS — G92 Toxic encephalopathy: Secondary | ICD-10-CM | POA: Diagnosis present

## 2020-01-15 DIAGNOSIS — R627 Adult failure to thrive: Secondary | ICD-10-CM

## 2020-01-15 DIAGNOSIS — K760 Fatty (change of) liver, not elsewhere classified: Secondary | ICD-10-CM | POA: Diagnosis present

## 2020-01-15 DIAGNOSIS — Z6821 Body mass index (BMI) 21.0-21.9, adult: Secondary | ICD-10-CM

## 2020-01-15 DIAGNOSIS — Z8249 Family history of ischemic heart disease and other diseases of the circulatory system: Secondary | ICD-10-CM

## 2020-01-15 DIAGNOSIS — A419 Sepsis, unspecified organism: Secondary | ICD-10-CM | POA: Diagnosis present

## 2020-01-15 DIAGNOSIS — R933 Abnormal findings on diagnostic imaging of other parts of digestive tract: Secondary | ICD-10-CM

## 2020-01-15 DIAGNOSIS — J9811 Atelectasis: Secondary | ICD-10-CM | POA: Diagnosis not present

## 2020-01-15 DIAGNOSIS — I480 Paroxysmal atrial fibrillation: Secondary | ICD-10-CM | POA: Diagnosis present

## 2020-01-15 DIAGNOSIS — J4540 Moderate persistent asthma, uncomplicated: Secondary | ICD-10-CM | POA: Diagnosis not present

## 2020-01-15 DIAGNOSIS — L89309 Pressure ulcer of unspecified buttock, unspecified stage: Secondary | ICD-10-CM | POA: Diagnosis present

## 2020-01-15 DIAGNOSIS — Z7901 Long term (current) use of anticoagulants: Secondary | ICD-10-CM

## 2020-01-15 DIAGNOSIS — E861 Hypovolemia: Secondary | ICD-10-CM | POA: Diagnosis present

## 2020-01-15 DIAGNOSIS — R52 Pain, unspecified: Secondary | ICD-10-CM | POA: Diagnosis not present

## 2020-01-15 DIAGNOSIS — B961 Klebsiella pneumoniae [K. pneumoniae] as the cause of diseases classified elsewhere: Secondary | ICD-10-CM | POA: Diagnosis present

## 2020-01-15 DIAGNOSIS — Z66 Do not resuscitate: Secondary | ICD-10-CM

## 2020-01-15 DIAGNOSIS — R109 Unspecified abdominal pain: Secondary | ICD-10-CM

## 2020-01-15 DIAGNOSIS — Z20822 Contact with and (suspected) exposure to covid-19: Secondary | ICD-10-CM | POA: Diagnosis present

## 2020-01-15 DIAGNOSIS — Z538 Procedure and treatment not carried out for other reasons: Secondary | ICD-10-CM | POA: Diagnosis not present

## 2020-01-15 DIAGNOSIS — R404 Transient alteration of awareness: Secondary | ICD-10-CM | POA: Diagnosis not present

## 2020-01-15 DIAGNOSIS — K639 Disease of intestine, unspecified: Secondary | ICD-10-CM | POA: Diagnosis present

## 2020-01-15 DIAGNOSIS — K529 Noninfective gastroenteritis and colitis, unspecified: Secondary | ICD-10-CM | POA: Diagnosis present

## 2020-01-15 DIAGNOSIS — K7689 Other specified diseases of liver: Secondary | ICD-10-CM | POA: Diagnosis not present

## 2020-01-15 DIAGNOSIS — I340 Nonrheumatic mitral (valve) insufficiency: Secondary | ICD-10-CM | POA: Diagnosis not present

## 2020-01-15 DIAGNOSIS — E871 Hypo-osmolality and hyponatremia: Secondary | ICD-10-CM | POA: Diagnosis present

## 2020-01-15 DIAGNOSIS — R531 Weakness: Secondary | ICD-10-CM | POA: Diagnosis present

## 2020-01-15 DIAGNOSIS — N200 Calculus of kidney: Secondary | ICD-10-CM | POA: Diagnosis not present

## 2020-01-15 DIAGNOSIS — L89150 Pressure ulcer of sacral region, unstageable: Secondary | ICD-10-CM | POA: Diagnosis present

## 2020-01-15 DIAGNOSIS — D631 Anemia in chronic kidney disease: Secondary | ICD-10-CM | POA: Diagnosis present

## 2020-01-15 DIAGNOSIS — K219 Gastro-esophageal reflux disease without esophagitis: Secondary | ICD-10-CM | POA: Diagnosis present

## 2020-01-15 DIAGNOSIS — K3184 Gastroparesis: Secondary | ICD-10-CM | POA: Diagnosis present

## 2020-01-15 DIAGNOSIS — E43 Unspecified severe protein-calorie malnutrition: Secondary | ICD-10-CM | POA: Diagnosis present

## 2020-01-15 DIAGNOSIS — E782 Mixed hyperlipidemia: Secondary | ICD-10-CM | POA: Diagnosis not present

## 2020-01-15 DIAGNOSIS — D509 Iron deficiency anemia, unspecified: Secondary | ICD-10-CM | POA: Diagnosis present

## 2020-01-15 DIAGNOSIS — I517 Cardiomegaly: Secondary | ICD-10-CM | POA: Diagnosis not present

## 2020-01-15 DIAGNOSIS — R5381 Other malaise: Secondary | ICD-10-CM | POA: Diagnosis not present

## 2020-01-15 DIAGNOSIS — I361 Nonrheumatic tricuspid (valve) insufficiency: Secondary | ICD-10-CM | POA: Diagnosis not present

## 2020-01-15 DIAGNOSIS — R634 Abnormal weight loss: Secondary | ICD-10-CM | POA: Diagnosis not present

## 2020-01-15 DIAGNOSIS — R7989 Other specified abnormal findings of blood chemistry: Secondary | ICD-10-CM | POA: Diagnosis present

## 2020-01-15 DIAGNOSIS — E1136 Type 2 diabetes mellitus with diabetic cataract: Secondary | ICD-10-CM | POA: Diagnosis present

## 2020-01-15 DIAGNOSIS — Z7984 Long term (current) use of oral hypoglycemic drugs: Secondary | ICD-10-CM

## 2020-01-15 DIAGNOSIS — D649 Anemia, unspecified: Secondary | ICD-10-CM | POA: Diagnosis present

## 2020-01-15 DIAGNOSIS — K6389 Other specified diseases of intestine: Secondary | ICD-10-CM | POA: Diagnosis not present

## 2020-01-15 DIAGNOSIS — H269 Unspecified cataract: Secondary | ICD-10-CM | POA: Diagnosis present

## 2020-01-15 DIAGNOSIS — E1143 Type 2 diabetes mellitus with diabetic autonomic (poly)neuropathy: Secondary | ICD-10-CM | POA: Diagnosis present

## 2020-01-15 DIAGNOSIS — I342 Nonrheumatic mitral (valve) stenosis: Secondary | ICD-10-CM | POA: Diagnosis not present

## 2020-01-15 DIAGNOSIS — E785 Hyperlipidemia, unspecified: Secondary | ICD-10-CM | POA: Diagnosis present

## 2020-01-15 DIAGNOSIS — I959 Hypotension, unspecified: Secondary | ICD-10-CM | POA: Diagnosis present

## 2020-01-15 DIAGNOSIS — N390 Urinary tract infection, site not specified: Secondary | ICD-10-CM | POA: Diagnosis present

## 2020-01-15 DIAGNOSIS — N1831 Chronic kidney disease, stage 3a: Secondary | ICD-10-CM | POA: Diagnosis present

## 2020-01-15 DIAGNOSIS — N179 Acute kidney failure, unspecified: Secondary | ICD-10-CM | POA: Diagnosis present

## 2020-01-15 DIAGNOSIS — Z79899 Other long term (current) drug therapy: Secondary | ICD-10-CM

## 2020-01-15 DIAGNOSIS — J811 Chronic pulmonary edema: Secondary | ICD-10-CM | POA: Diagnosis not present

## 2020-01-15 DIAGNOSIS — I4892 Unspecified atrial flutter: Secondary | ICD-10-CM | POA: Diagnosis present

## 2020-01-15 DIAGNOSIS — F419 Anxiety disorder, unspecified: Secondary | ICD-10-CM | POA: Diagnosis present

## 2020-01-15 DIAGNOSIS — E1142 Type 2 diabetes mellitus with diabetic polyneuropathy: Secondary | ICD-10-CM | POA: Diagnosis present

## 2020-01-15 DIAGNOSIS — I700 Atherosclerosis of aorta: Secondary | ICD-10-CM | POA: Diagnosis not present

## 2020-01-15 DIAGNOSIS — R1310 Dysphagia, unspecified: Secondary | ICD-10-CM

## 2020-01-15 DIAGNOSIS — Z8673 Personal history of transient ischemic attack (TIA), and cerebral infarction without residual deficits: Secondary | ICD-10-CM

## 2020-01-15 DIAGNOSIS — Z515 Encounter for palliative care: Secondary | ICD-10-CM

## 2020-01-15 DIAGNOSIS — M2550 Pain in unspecified joint: Secondary | ICD-10-CM | POA: Diagnosis not present

## 2020-01-15 DIAGNOSIS — R579 Shock, unspecified: Secondary | ICD-10-CM | POA: Diagnosis not present

## 2020-01-15 DIAGNOSIS — Z888 Allergy status to other drugs, medicaments and biological substances status: Secondary | ICD-10-CM

## 2020-01-15 DIAGNOSIS — J45909 Unspecified asthma, uncomplicated: Secondary | ICD-10-CM | POA: Diagnosis not present

## 2020-01-15 DIAGNOSIS — Z9049 Acquired absence of other specified parts of digestive tract: Secondary | ICD-10-CM

## 2020-01-15 DIAGNOSIS — I272 Pulmonary hypertension, unspecified: Secondary | ICD-10-CM | POA: Diagnosis present

## 2020-01-15 DIAGNOSIS — R0602 Shortness of breath: Secondary | ICD-10-CM | POA: Diagnosis not present

## 2020-01-15 DIAGNOSIS — Z833 Family history of diabetes mellitus: Secondary | ICD-10-CM

## 2020-01-15 DIAGNOSIS — M797 Fibromyalgia: Secondary | ICD-10-CM | POA: Diagnosis present

## 2020-01-15 DIAGNOSIS — R0902 Hypoxemia: Secondary | ICD-10-CM | POA: Diagnosis not present

## 2020-01-15 DIAGNOSIS — R911 Solitary pulmonary nodule: Secondary | ICD-10-CM | POA: Diagnosis present

## 2020-01-15 DIAGNOSIS — Z86010 Personal history of colonic polyps: Secondary | ICD-10-CM

## 2020-01-15 DIAGNOSIS — Z881 Allergy status to other antibiotic agents status: Secondary | ICD-10-CM

## 2020-01-15 DIAGNOSIS — E876 Hypokalemia: Secondary | ICD-10-CM | POA: Diagnosis not present

## 2020-01-15 DIAGNOSIS — Z880 Allergy status to penicillin: Secondary | ICD-10-CM

## 2020-01-15 DIAGNOSIS — R6520 Severe sepsis without septic shock: Secondary | ICD-10-CM

## 2020-01-15 LAB — URINALYSIS, ROUTINE W REFLEX MICROSCOPIC
Glucose, UA: NEGATIVE mg/dL
Ketones, ur: NEGATIVE mg/dL
Leukocytes,Ua: NEGATIVE
Nitrite: NEGATIVE
Protein, ur: 30 mg/dL — AB
Specific Gravity, Urine: 1.024 (ref 1.005–1.030)
pH: 5 (ref 5.0–8.0)

## 2020-01-15 LAB — COMPREHENSIVE METABOLIC PANEL
ALT: 92 U/L — ABNORMAL HIGH (ref 0–44)
AST: 125 U/L — ABNORMAL HIGH (ref 15–41)
Albumin: 1.9 g/dL — ABNORMAL LOW (ref 3.5–5.0)
Alkaline Phosphatase: 101 U/L (ref 38–126)
Anion gap: 9 (ref 5–15)
BUN: 22 mg/dL (ref 8–23)
CO2: 23 mmol/L (ref 22–32)
Calcium: 8.3 mg/dL — ABNORMAL LOW (ref 8.9–10.3)
Chloride: 101 mmol/L (ref 98–111)
Creatinine, Ser: 1.18 mg/dL — ABNORMAL HIGH (ref 0.44–1.00)
GFR calc Af Amer: 49 mL/min — ABNORMAL LOW (ref >60)
GFR calc non Af Amer: 43 mL/min — ABNORMAL LOW (ref >60)
Glucose, Bld: 101 mg/dL — ABNORMAL HIGH (ref 70–99)
Potassium: 4.2 mmol/L (ref 3.5–5.1)
Sodium: 133 mmol/L — ABNORMAL LOW (ref 135–145)
Total Bilirubin: 2.5 mg/dL — ABNORMAL HIGH (ref 0.3–1.2)
Total Protein: 7 g/dL (ref 6.5–8.1)

## 2020-01-15 LAB — CBC WITH DIFFERENTIAL/PLATELET
Abs Immature Granulocytes: 0.2 10*3/uL — ABNORMAL HIGH (ref 0.00–0.07)
Basophils Absolute: 0 10*3/uL (ref 0.0–0.1)
Basophils Relative: 0 %
Eosinophils Absolute: 0 10*3/uL (ref 0.0–0.5)
Eosinophils Relative: 0 %
HCT: 32.2 % — ABNORMAL LOW (ref 36.0–46.0)
Hemoglobin: 10.5 g/dL — ABNORMAL LOW (ref 12.0–15.0)
Immature Granulocytes: 1 %
Lymphocytes Relative: 8 %
Lymphs Abs: 1.7 10*3/uL (ref 0.7–4.0)
MCH: 26.3 pg (ref 26.0–34.0)
MCHC: 32.6 g/dL (ref 30.0–36.0)
MCV: 80.7 fL (ref 80.0–100.0)
Monocytes Absolute: 0.8 10*3/uL (ref 0.1–1.0)
Monocytes Relative: 4 %
Neutro Abs: 17.6 10*3/uL — ABNORMAL HIGH (ref 1.7–7.7)
Neutrophils Relative %: 87 %
Platelets: 334 10*3/uL (ref 150–400)
RBC: 3.99 MIL/uL (ref 3.87–5.11)
RDW: 19.8 % — ABNORMAL HIGH (ref 11.5–15.5)
WBC: 20.3 10*3/uL — ABNORMAL HIGH (ref 4.0–10.5)
nRBC: 0 % (ref 0.0–0.2)

## 2020-01-15 LAB — SARS CORONAVIRUS 2 BY RT PCR (HOSPITAL ORDER, PERFORMED IN ~~LOC~~ HOSPITAL LAB): SARS Coronavirus 2: NEGATIVE

## 2020-01-15 LAB — LACTIC ACID, PLASMA
Lactic Acid, Venous: 2.6 mmol/L (ref 0.5–1.9)
Lactic Acid, Venous: 1.6 mmol/L (ref 0.5–1.9)

## 2020-01-15 LAB — PHOSPHORUS: Phosphorus: 2.7 mg/dL (ref 2.5–4.6)

## 2020-01-15 LAB — MAGNESIUM: Magnesium: 1.9 mg/dL (ref 1.7–2.4)

## 2020-01-15 MED ORDER — ONDANSETRON HCL 4 MG/2ML IJ SOLN
4.0000 mg | Freq: Four times a day (QID) | INTRAMUSCULAR | Status: DC | PRN
  Administered 2020-01-18 – 2020-01-20 (×3): 4 mg via INTRAVENOUS
  Filled 2020-01-15 (×4): qty 2

## 2020-01-15 MED ORDER — LACTATED RINGERS IV BOLUS
1000.0000 mL | Freq: Once | INTRAVENOUS | Status: AC
  Administered 2020-01-15: 1000 mL via INTRAVENOUS

## 2020-01-15 MED ORDER — GABAPENTIN 100 MG PO CAPS
100.0000 mg | ORAL_CAPSULE | Freq: Three times a day (TID) | ORAL | Status: DC
  Administered 2020-01-15: 100 mg via ORAL
  Filled 2020-01-15: qty 1

## 2020-01-15 MED ORDER — SODIUM CHLORIDE 0.9% FLUSH
3.0000 mL | Freq: Two times a day (BID) | INTRAVENOUS | Status: DC
  Administered 2020-01-16 – 2020-01-24 (×12): 3 mL via INTRAVENOUS

## 2020-01-15 MED ORDER — SODIUM CHLORIDE 0.9 % IV SOLN
2.0000 g | INTRAVENOUS | Status: DC
  Administered 2020-01-15: 2 g via INTRAVENOUS
  Filled 2020-01-15: qty 2

## 2020-01-15 MED ORDER — VANCOMYCIN HCL IN DEXTROSE 1-5 GM/200ML-% IV SOLN
1000.0000 mg | Freq: Once | INTRAVENOUS | Status: AC
  Administered 2020-01-16: 1000 mg via INTRAVENOUS
  Filled 2020-01-15: qty 200

## 2020-01-15 MED ORDER — VANCOMYCIN HCL 500 MG/100ML IV SOLN: 500.0000 mg | INTRAVENOUS | Status: DC

## 2020-01-15 MED ORDER — AMIODARONE HCL 200 MG PO TABS
200.0000 mg | ORAL_TABLET | Freq: Two times a day (BID) | ORAL | Status: DC
  Administered 2020-01-15 – 2020-01-25 (×16): 200 mg via ORAL
  Filled 2020-01-15 (×18): qty 1

## 2020-01-15 MED ORDER — ALBUTEROL SULFATE HFA 108 (90 BASE) MCG/ACT IN AERS
2.0000 | INHALATION_SPRAY | Freq: Four times a day (QID) | RESPIRATORY_TRACT | Status: DC | PRN
  Filled 2020-01-15: qty 6.7

## 2020-01-15 MED ORDER — SODIUM CHLORIDE 0.9 % IV SOLN
1.0000 g | Freq: Once | INTRAVENOUS | Status: AC
  Administered 2020-01-15: 1 g via INTRAVENOUS
  Filled 2020-01-15: qty 10

## 2020-01-15 MED ORDER — ATORVASTATIN CALCIUM 40 MG PO TABS
40.0000 mg | ORAL_TABLET | Freq: Every day | ORAL | Status: DC
  Administered 2020-01-16 – 2020-01-22 (×7): 40 mg via ORAL
  Filled 2020-01-15 (×7): qty 1

## 2020-01-15 MED ORDER — INSULIN ASPART 100 UNIT/ML ~~LOC~~ SOLN
0.0000 [IU] | SUBCUTANEOUS | Status: DC
  Administered 2020-01-18 – 2020-01-20 (×8): 1 [IU] via SUBCUTANEOUS
  Administered 2020-01-21: 2 [IU] via SUBCUTANEOUS
  Administered 2020-01-21: 1 [IU] via SUBCUTANEOUS
  Administered 2020-01-21: 2 [IU] via SUBCUTANEOUS
  Administered 2020-01-22 (×2): 1 [IU] via SUBCUTANEOUS

## 2020-01-15 MED ORDER — SODIUM CHLORIDE 0.9 % IV SOLN: INTRAVENOUS | Status: AC

## 2020-01-15 MED ORDER — IOHEXOL 300 MG/ML  SOLN
75.0000 mL | Freq: Once | INTRAMUSCULAR | Status: AC | PRN
  Administered 2020-01-15: 75 mL via INTRAVENOUS

## 2020-01-15 MED ORDER — ONDANSETRON HCL 4 MG PO TABS
4.0000 mg | ORAL_TABLET | Freq: Four times a day (QID) | ORAL | Status: DC | PRN
  Filled 2020-01-15: qty 1

## 2020-01-15 MED ORDER — DOCUSATE SODIUM 100 MG PO CAPS
100.0000 mg | ORAL_CAPSULE | Freq: Two times a day (BID) | ORAL | Status: DC
  Administered 2020-01-15 – 2020-01-24 (×14): 100 mg via ORAL
  Filled 2020-01-15 (×15): qty 1

## 2020-01-15 MED ORDER — METRONIDAZOLE IN NACL 5-0.79 MG/ML-% IV SOLN
500.0000 mg | Freq: Three times a day (TID) | INTRAVENOUS | Status: DC
  Administered 2020-01-15 – 2020-01-16 (×2): 500 mg via INTRAVENOUS
  Filled 2020-01-15 (×2): qty 100

## 2020-01-15 MED ORDER — SODIUM CHLORIDE 0.9 % IV SOLN: 2.0000 g | Freq: Once | INTRAVENOUS | Status: DC

## 2020-01-15 NOTE — ED Notes (Signed)
Lab to add on culture to previously sent UA

## 2020-01-15 NOTE — ED Notes (Signed)
Lab tech at bedside to draw blood

## 2020-01-15 NOTE — H&P (Signed)
Kerry Barnes EFE:[id number] DOB: [birth year] DOA: 01/15/2020     PCP: Aletha Halim., PA-C   Outpatient Specialists:   NONE    Patient arrived to ER on 01/15/20 at 1727 Referred by Attending Mesner, Corene Cornea, MD   Patient coming from: home Lives With family    Chief Complaint:   Chief Complaint  Patient presents with  . Hypotension    HPI: Kerry Barnes is a 86 y.o. female with medical history significant of CKD stage III, atrial fibrillation on anticoagulation, asthma, pulmonary hypertension, chronic diastolic hypertension, diabetes mellitus type 2, HLD, HTN, gastroparesis IBS, asthma,    Presented with   generalized fatigue and diarrhea. In emergency department patient noted to have significant loose stool nursing staff noted that there was been an open area on her sacrum which was treated. She has been progressively declining and unable to ambulate very well.  Resulting in pressure ulcer.  She has not had any fevers or chills no nausea no vomiting but has had decreased p.o. intake. Was seen in emergency department last week for bilateral leg pain thought to be secondary to neuropathy  Infectious risk factors:  Reports  fatigue   Has  been vaccinated against COVID    Initial COVID TEST  NEGATIVE   Lab Results  Component Value Date   [place] NEGATIVE 01/15/2020    Regarding pertinent Chronic problems:     Hyperlipidemia -  on statins Lipitor Lipid Panel     Component Value Date/Time   CHOL (H) 09/09/2010 0110    206        ATP III CLASSIFICATION:  <200     mg/dL   Desirable  200-239  mg/dL   Borderline High  >=240    mg/dL   High          TRIG 129 09/09/2010 0110   HDL 51 09/09/2010 0110   CHOLHDL 4.0 09/09/2010 0110   VLDL 26 09/09/2010 0110   LDLCALC (H) 09/09/2010 0110    129        Total Cholesterol/HDL:CHD Risk Coronary Heart Disease Risk Table                     Men   Women  1/2 Average Risk   3.4   3.3  Average Risk       5.0    4.4  2 X Average Risk   9.6   7.1  3 X Average Risk  23.4   11.0        Use the calculated Patient Ratio above and the CHD Risk Table to determine the patient's CHD Risk.        ATP III CLASSIFICATION (LDL):  <100     mg/dL   Optimal  100-129  mg/dL   Near or Above                    Optimal  130-159  mg/dL   Borderline  160-189  mg/dL   High  >190     mg/dL   Very High     HTN on Norvasc   chronic CHF diastolic  - last echo 8325 EF 55% to 60%.    DM 2 -  Lab Results  Component Value Date   HGBA1C (H) 09/08/2010    6.3 (NOTE)  According to the ADA Clinical Practice Recommendations for 2011, when HbA1c is used as a screening test:   >=6.5%   Diagnostic of Diabetes Mellitus           (if abnormal result  is confirmed)  5.7-6.4%   Increased risk of developing Diabetes Mellitus  References:Diagnosis and Classification of Diabetes Mellitus,Diabetes HKVQ,2595,63(OVFIE 1):S62-S69 and Standards of Medical Care in         Diabetes - 2011,Diabetes PPIR,5188,41  (Suppl 1):S11-S61.   on Januvia      Asthma -well  controlled on home inhalers        A. Fib -  - CHA2DS2 vas score      8        current  on anticoagulation with   Eliquis,           -  Rate control:  Currently controlled  spontaneous        - Rhythm control:   Amiodarone,    CKD stage III - baseline Cr 1. 30    Lab Results  Component Value Date   CREATININE 1.18 (H) 01/15/2020   CREATININE 0.95 01/12/2020   CREATININE 1.37 (H) 05/05/2018       Chronic anemia - baseline hg Hemoglobin & Hematocrit  Recent Labs    01/12/20 1619 01/15/20 1901  HGB 11.5* 10.5*     While in ER:  Initially hypotensive got IV fluids   Hospitalist was called for admission for sepsis, elevated LFt's  The following Work up has been ordered so far:  Orders Placed This Encounter  Procedures  . Culture, blood (Routine x 2)  . SARS Coronavirus 2 by RT PCR  (hospital order, performed in [facility] hospital lab) Nasopharyngeal Nasopharyngeal Swab  . Urine culture  . DG Chest 2 View  . Comprehensive metabolic panel  . Lactic acid, plasma  . CBC with Differential  . Urinalysis, Routine w reflex microscopic  . Magnesium  . Phosphorus  . Notify Physician if pt is possible Sepsis patient  . Document height and weight  . Insert / maintain saline lock  . In and Out Cath  . Consult for [place] Admission  ALL PATIENTS BEING ADMITTED/HAVING PROCEDURES NEED COVID-19 SCREENING    Following Medications were ordered in ER: Medications  cefTRIAXone (ROCEPHIN) 1 g in sodium chloride 0.9 % 100 mL IVPB (1 g Intravenous New Bag/Given 01/15/20 2149)  lactated ringers bolus 1,000 mL (0 mLs Intravenous Stopped 01/15/20 2020)      Significant initial  Findings: Abnormal Labs Reviewed  COMPREHENSIVE METABOLIC PANEL - Abnormal; Notable for the following components:      Result Value   Sodium 133 (*)    Glucose, Bld 101 (*)    Creatinine, Ser 1.18 (*)    Calcium 8.3 (*)    Albumin 1.9 (*)    AST 125 (*)    ALT 92 (*)    Total Bilirubin 2.5 (*)    GFR calc non Af Amer 43 (*)    GFR calc Af Amer 49 (*)    All other components within normal limits  LACTIC ACID, PLASMA - Abnormal; Notable for the following components:   Lactic Acid, Venous 2.6 (*)    All other components within normal limits  CBC WITH DIFFERENTIAL/PLATELET - Abnormal; Notable for the following components:   WBC 20.3 (*)    Hemoglobin 10.5 (*)    HCT 32.2 (*)    RDW 19.8 (*)    Neutro Abs 17.6 (*)  Abs Immature Granulocytes 0.20 (*)    All other components within normal limits  URINALYSIS, ROUTINE W REFLEX MICROSCOPIC - Abnormal; Notable for the following components:   Color, Urine AMBER (*)    APPearance CLOUDY (*)    Hgb urine dipstick MODERATE (*)    Bilirubin Urine SMALL (*)    Protein, ur 30 (*)    Bacteria, UA MANY (*)    All other components within normal limits      Otherwise labs showing:    Recent Labs  Lab 01/12/20 1534 01/15/20 1901  NA 135 133*  K 4.0 4.2  CO2 22 23  GLUCOSE 97 101*  BUN 17 22  CREATININE 0.95 1.18*  CALCIUM 8.4* 8.3*  MG  --  1.9  PHOS  --  2.7    Cr    Up from last week  see below Lab Results  Component Value Date   CREATININE 1.18 (H) 01/15/2020   CREATININE 0.95 01/12/2020   CREATININE 1.37 (H) 05/05/2018    Recent Labs  Lab 01/15/20 1901  AST 125*  ALT 92*  ALKPHOS 101  BILITOT 2.5*  PROT 7.0  ALBUMIN 1.9*   Lab Results  Component Value Date   CALCIUM 8.3 (L) 01/15/2020   PHOS 2.7 01/15/2020  WBC      Component Value Date/Time   WBC 20.3 (H) 01/15/2020 1901   ANC    Component Value Date/Time   NEUTROABS 17.6 (H) 01/15/2020 1901   ALC No components found for: LYMPHAB    Plt: Lab Results  Component Value Date   PLT 334 01/15/2020   Lactic Acid, Venous    Component Value Date/Time   LATICACIDVEN 2.6 (HH) 01/15/2020 1901     Procalcitonin   Ordered   COVID-19 Labs  No results for input(s): DDIMER, FERRITIN, LDH, CRP in the last 72 hours.  Lab Results  Component Value Date   SARSCOV2NAA NEGATIVE 01/15/2020    HG/HCT  Down      Component Value Date/Time   HGB 10.5 (L) 01/15/2020 1901   HCT 32.2 (L) 01/15/2020 1901    No results for input(s): LIPASE, AMYLASE in the last 168 hours. No results for input(s): AMMONIA in the last 168 hours.     Cardiac Panel (last 3 results) Recent Labs    01/15/20 2337  CKTOTAL 534*       ECG: Ordered Personally reviewed by me showing: HR : 58 Rhythm:  SR,   nonspecific changes,   QTC 498    UA   evidence of UTI      Urine analysis:    Component Value Date/Time   COLORURINE AMBER (A) 01/15/2020 1955   APPEARANCEUR CLOUDY (A) 01/15/2020 1955   LABSPEC 1.024 01/15/2020 1955   PHURINE 5.0 01/15/2020 1955   GLUCOSEU NEGATIVE 01/15/2020 1955   HGBUR MODERATE (A) 01/15/2020 1955   BILIRUBINUR SMALL (A) 01/15/2020 1955    KETONESUR NEGATIVE 01/15/2020 1955   PROTEINUR 30 (A) 01/15/2020 1955   UROBILINOGEN 0.2 09/08/2010 1330   NITRITE NEGATIVE 01/15/2020 1955   LEUKOCYTESUR NEGATIVE 01/15/2020 1955       Ordered  CXR - mild infiltrate Left >Right   ED Triage Vitals  Enc Vitals Group     BP 01/15/20 1731 (!) 89/46     Pulse Rate 01/15/20 1745 60     Resp 01/15/20 1731 17     Temp 01/15/20 1731 99.1 F (37.3 C)     Temp src --  SpO2 01/15/20 1731 100 %     Weight --      Height --      Head Circumference --      Peak Flow --      Pain Score 01/15/20 1732 0     Pain Loc --      Pain Edu? --      Excl. in [place]? --   TMAX(24)@       Latest  Blood pressure (!) 120/47, pulse (!) 59, temperature 99.1 F (37.3 C), resp. rate 18, SpO2 100 %.  Review of Systems:    Pertinent positives include:  fatigue,  Constitutional:  No weight loss, night sweats, Fevers, chills, weight loss  HEENT:  No headaches, Difficulty swallowing,Tooth/dental problems,Sore throat,  No sneezing, itching, ear ache, nasal congestion, post nasal drip,  Cardio-vascular:  No chest pain, Orthopnea, PND, anasarca, dizziness, palpitations.no Bilateral lower extremity swelling  GI:  No heartburn, indigestion, abdominal pain, nausea, vomiting, diarrhea, change in bowel habits, loss of appetite, melena, blood in stool, hematemesis Resp:  no shortness of breath at rest. No dyspnea on exertion, No excess mucus, no productive cough, No non-productive cough, No coughing up of blood.No change in color of mucus.No wheezing. Skin:  no rash or lesions. No jaundice GU:  no dysuria, change in color of urine, no urgency or frequency. No straining to urinate.  No flank pain.  Musculoskeletal:  No joint pain or no joint swelling. No decreased range of motion. No back pain.  Psych:  No change in mood or affect. No depression or anxiety. No memory loss.  Neuro: no localizing neurological complaints, no tingling, no weakness, no double  vision, no gait abnormality, no slurred speech, no confusion  All systems reviewed and apart from [place] all are negative  Past Medical History:   Past Medical History:  Diagnosis Date  . Anxiety   . Arthritis   . Asthma   . Atypical atrial flutter ([place])   . Cataract   . Chronic diastolic CHF (congestive heart failure) ([place])   . Fibromyalgia   . GERD (gastroesophageal reflux disease)   . History of blood transfusion   . Hyperlipemia   . Hypertension   . IBS (irritable bowel syndrome)   . Seasonal allergies   . Stroke [facility]) 2013   TIA  . Tubular adenoma of colon   . Type 2 diabetes mellitus ([place])      Past Surgical History:  Procedure Laterality Date  . ANKLE FRACTURE SURGERY    . CARDIOVERSION N/A 02/04/2016   Procedure: CARDIOVERSION;  Surgeon: Sueanne Margarita, MD;  Location: [facility] ENDOSCOPY;  Service: Cardiovascular;  Laterality: N/A;  . CARDIOVERSION N/A 05/01/2016   Procedure: CARDIOVERSION;  Surgeon: Fay Records, MD;  Location[facility] ENDOSCOPY;  Service: Cardiovascular;  Laterality: N/A;  . CARDIOVERSION N/A 04/25/2018   Procedure: CARDIOVERSION;  Surgeon: [name], MD;  Location: [place];  Service: Cardiovascular;  Laterality: N/A;  . CHOLECYSTECTOMY    . TEE WITHOUT CARDIOVERSION N/A 02/04/2016   Procedure: TRANSESOPHAGEAL ECHOCARDIOGRAM (TEE);  Surgeon: Sueanne Margarita, MD;  Location: [facility] ENDOSCOPY;  Service: Cardiovascular;  Laterality: N/A;  . TUBAL LIGATION    . WRIST FRACTURE SURGERY      Social History:  Ambulatory  bed bound     reports that she has never smoked. She has never used smokeless tobacco. She reports that she does not drink alcohol and does not use drugs.   Family History:   Family History  Problem Relation Age of Onset  . Diabetes Mother   . Hypertension Mother   . Diabetes Father   . Diabetes Sister   . Diabetes Brother   . Hypertension Brother   . Colon cancer Neg Hx   . Stomach cancer Neg Hx     Allergies: Allergies  Allergen  Reactions  . Codeine Nausea Only  . Metformin Diarrhea  . Amoxicillin Rash  . Biaxin [Clarithromycin] Rash  . Ciprofloxacin Rash  . Penicillins Rash    Childhood allergic reaction Has patient had a PCN reaction causing immediate rash, facial/tongue/throat swelling, SOB or lightheadedness with hypotension: Yes Has patient had a PCN reaction causing severe rash involving mucus membranes or skin necrosis: No Has patient had a PCN reaction that required hospitalization: Yes Has patient had a PCN reaction occurring within the last 10 years: No If all of the above answers are "NO", then may proceed with Cephalosporin use.     Prior to Admission medications   Medication Sig Start Date End Date Taking? Authorizing Provider  acetaminophen (TYLENOL) 325 MG tablet Take 650 mg by mouth every 6 (six) hours as needed.    [provider]  albuterol (PROVENTIL HFA;VENTOLIN HFA) 108 (90 BASE) MCG/ACT inhaler Inhale into the lungs every 6 (six) hours as needed for wheezing or shortness of breath.    [provider]  amiodarone (PACERONE) 200 MG tablet TAKE 1 TABLET BY MOUTH TWICE DAILY 08/15/19   Patwardhan, Reynold Bowen, MD  amLODipine (NORVASC) 5 MG tablet  10/17/18   [provider]  atorvastatin (LIPITOR) 40 MG tablet Take 40 mg by mouth daily.    [provider]  cycloSPORINE (RESTASIS) 0.05 % ophthalmic emulsion Apply 1 drop to eye daily. 07/07/18   [provider]  ELIQUIS 2.5 MG TABS tablet TAKE 1 TABLET(2.5 MG) BY MOUTH TWICE DAILY 01/10/20   Patwardhan, Manish J, MD  EPINEPHrine (EPI-PEN) 0.3 mg/0.3 mL SOAJ injection Inject 0.3 mg once as needed into the muscle (severe allergic reaction).     [provider]  gabapentin (NEURONTIN) 100 MG capsule Take 1 capsule (100 mg total) by mouth 3 (three) times daily. 01/12/20   Deno Etienne, DO  JANUVIA 50 MG tablet  10/13/18   [provider]  levalbuterol Penne Lash HFA) 45 MCG/ACT inhaler Inhale into the  lungs.    [provider]  LINZESS 290 MCG CAPS capsule TAKE 1 CAPSULE BY MOUTH DAILY BEFORE BREAKFAST 07/27/19   Pyrtle, Lajuan Lines, MD  methocarbamol (ROBAXIN) 500 MG tablet Take 1 tablet (500 mg total) every 6 (six) hours as needed by mouth for muscle spasms. 04/27/17   Geradine Girt, DO  mometasone (NASONEX) 50 MCG/ACT nasal spray Place 2 sprays into the nose daily as needed (nasal congestion).     [provider]  olopatadine (PATANOL) 0.1 % ophthalmic solution Place 1 drop into both eyes 2 (two) times daily.     [provider]  Propylene Glycol (SYSTANE BALANCE OP) Place 1 drop daily as needed into both eyes (dryness).     [provider]  ranitidine (ZANTAC) 150 MG capsule Take 150 mg by mouth every evening.    [provider]  solifenacin (VESICARE) 5 MG tablet Take 5 mg by mouth daily. 09/02/17   [provider]  trimethoprim-polymyxin b (POLYTRIM) ophthalmic solution Place 1 drop into the left eye 4 (four) times daily. 02/03/18   [provider]   Physical Exam: Vitals with BMI 01/15/2020 01/15/2020 01/15/2020  Height - - -  Weight - - -  BMI - - -  Systolic 700 91 95  Diastolic 47 51 54  Pulse 59 64 65    1. General:  in No  Acute distress   Chronically ill  -appearing 2. Psychological: Alert and  Oriented 3. Head/ENT:     Dry Mucous Membranes                          Head Non traumatic, neck supple                            Poor Dentition 4. SKIN:  decreased Skin turgor,  Skin clean Dry and intact no rash, sacral breask down 5. Heart: Regular rate and rhythm no Murmur, no Rub or gallop 6. Lungs: no wheezes or crackles   7. Abdomen: Soft,  non-tender, Non distended  bowel sounds present 8. Lower extremities: no clubbing, cyanosis, no  edema 9. Neurologically Grossly intact, moving all 4 extremities equally   10. MSK: Normal range of motion   All other LABS:     Recent Labs  Lab 01/12/20 1619 01/15/20 1901  WBC  13.1* 20.3*  NEUTROABS  --  17.6*  HGB 11.5* 10.5*  HCT 35.1* 32.2*  MCV 81.1 80.7  PLT 373 334     Recent Labs  Lab 01/12/20 1534 01/15/20 1901  NA 135 133*  K 4.0 4.2  CL 101 101  CO2 22 23  GLUCOSE 97 101*  BUN 17 22  CREATININE 0.95 1.18*  CALCIUM 8.4* 8.3*  MG  --  1.9  PHOS  --  2.7     Recent Labs  Lab 01/15/20 1901  AST 125*  ALT 92*  ALKPHOS 101  BILITOT 2.5*  PROT 7.0  ALBUMIN 1.9*       Cultures:    Component Value Date/Time   SDES URINE, RANDOM 06/04/2007 1223   SPECREQUEST NONE 06/04/2007 1223   CULT NO GROWTH 06/04/2007 1223   REPTSTATUS 06/06/2007 FINAL 06/04/2007 1223     Radiological Exams on Admission: DG Chest 2 View  Result Date: 01/15/2020 CLINICAL DATA:  Ongoing weakness. EXAM: CHEST - 2 VIEW COMPARISON:  April 23, 2018 FINDINGS: Mild areas of atelectasis and/or infiltrate are seen within the bilateral lung bases, left greater than right. Very small bilateral pleural effusions are seen. No pneumothorax is identified. The cardiac silhouette is mildly enlarged. Mild calcification of the aortic arch is noted. Multilevel degenerative changes seen throughout the thoracic spine. Radiopaque surgical clips are seen within the right upper quadrant. IMPRESSION: 1. Mild bibasilar atelectasis and/or infiltrate, left greater than right. 2. Very small bilateral pleural effusions. Electronically Signed   By: Virgina Norfolk [name]   On: 01/15/2020 18:58    Chart has been reviewed    Assessment/Plan  86 y.o. female with medical history significant of CKD stage III, atrial fibrillation on anticoagulation, asthma, pulmonary hypertension, chronic diastolic hypertension, diabetes mellitus type 2, HLD, HTN, gastroparesis IBS, asthma,  Admitted for Sepsis  Present on Admission: . Sepsis ([place]) -   -Patient meets sepsis criteria with   leukocytosis     Initial lactic acid Lactic Acid, Venous    Component Value Date/Time   LATICACIDVEN 2.6 (HH)  01/15/2020 1901   Lactic Acid, Venous    Component Value Date/Time   LATICACIDVEN 1.6 01/15/2020 2208    Source most likely:  UTI, vs pneumonia,  vs enteritis vs decubitus ulcer    -We will rehydrate, treat with IV antibiotics, follow lactic acid - Await results of blood and urine culture and adjust antibiotics as needed - Obtain MRSA serologies   BP soft giving IV fluids and IV albumin with some good response for now,  Pt is Full code Please consult PCCM if does not respond to fluids  . Elevated LFTs -unclear etiology order additional imaging to further evaluate liver.  Hepatitis panel ordered.  Check ammonia level and INR. May need right upper quad ultrasound if CT nondiagnostic   . AKI (acute kidney injury) ([place]) -gently rehydrate and follow   . Asthma -chronic stable continue home medications   . Atrial flutter, paroxysmal (HCC) -  on Eliquis will continue Currently rate controlled continue to follow   . Chronic diastolic heart failure (HCC) -current appears to be in the dry side gently rehydrate   . Chronic renal insufficiency, stage III (moderate) (HCC) -chronic avoid nephrotoxic medication   . Essential hypertension -initially hypotensive will hold home medication   . Gastroparesis -supportive management   . Type 2 diabetes with stage 3 chronic kidney disease GFR 30-59 (HCC) -  - Order Sensitive SSI     -  check TSH and HgA1C  - Hold by mouth medications    . Acute lower UTI -obtain urine culture broad-spectrum antibiotics cover for sepsis at this time unclear etiology but urine is one of the possibility   Possible pneumonia.  Initial chest x-ray showing possible infiltrate.  Will cover broadly for now await results of sputum cultures And blood cultures CT abd confirmed PNA, COVID neg   . Sacral decubitus ulcer -order wound care consult   Anemia - obtain anemia panel  CT abd showing possible enteritis - had diarrhea in Er X1 Obtain gastr panel Other plan as  per orders.  DVT prophylaxis:      Eliquis    Code Status:    Code Status: Prior FULL CODE  as per patient    Family Communication:   Family not at  Bedside   Disposition Plan:   likely will need placement for rehabilitation                             Following barriers for discharge:                            Electrolytes corrected                                                           Will need to be able to tolerate PO                                               Would benefit from PT/OT eval prior to DC  Ordered                   Swallow eval - SLP ordered                   Diabetes care coordinator  Transition of care consulted                   Nutrition    consulted                                      Consults called: none  Admission status:  ED Disposition    ED Disposition Condition Comment   Admit  The patient appears reasonably stabilized for admission considering the current resources, flow, and capabilities available in the ED at this time, and I doubt any other [facility] requiring further screening and/or treatment in the ED prior to admission is  present.       Obs      Level of care       SDU tele indefinitely please discontinue once patient no longer qualifies COVID-19 Labs    Lab Results  Component Value Date   [place] NEGATIVE 01/15/2020     Precautions: admitted as   Covid Negative     PPE: Used by the provider:   N95  eye Goggles,  Gloves     Maison Agrusa 01/16/2020, 1:17 AM    [facility]     after 2 AM please page floor coverage PA If 7AM-7PM, please contact the day team taking care of the patient using [email or web address]   Patient was evaluated in the context of the global COVID-19 pandemic, which necessitated consideration that the patient might be at risk for infection with the SARS-CoV-2 virus that causes COVID-19. Institutional protocols and algorithms that pertain to the evaluation of patients at  risk for COVID-19 are in a state of rapid change based on information released by regulatory bodies including the CDC and federal and state organizations. These policies and algorithms were followed during the patient's care.

## 2020-01-15 NOTE — Progress Notes (Signed)
Pharmacy Antibiotic Note  Kerry Barnes is a 86 y.o. female admitted on 01/15/2020 with sepsis.  Pharmacy has been consulted for vancomycin and cefepime dosing.  Plan: Vancomycin 1000 mg IV x 1, then 500 mg IV every 24 hours (target vancomycin trough 15-20) Cefepime 2g IV every 24 hours Monitor renal function, Cx and clinical progression to narrow Vancomycin trough at steady state     Temp (24hrs), Avg:99.1 F (37.3 C), Min:99.1 F (37.3 C), Max:99.1 F (37.3 C)  Recent Labs  Lab 01/12/20 1534 01/12/20 1619 01/15/20 1901  WBC  --  13.1* 20.3*  CREATININE 0.95  --  1.18*  LATICACIDVEN  --   --  2.6*    CrCl cannot be calculated (Unknown ideal weight.).    Allergies  Allergen Reactions  . Codeine Nausea Only  . Metformin Diarrhea  . Amoxicillin Rash  . Biaxin [Clarithromycin] Rash  . Ciprofloxacin Rash  . Penicillins Rash    Childhood allergic reaction Has patient had a PCN reaction causing immediate rash, facial/tongue/throat swelling, SOB or lightheadedness with hypotension: Yes Has patient had a PCN reaction causing severe rash involving mucus membranes or skin necrosis: No Has patient had a PCN reaction that required hospitalization: Yes Has patient had a PCN reaction occurring within the last 10 years: No If all of the above answers are "NO", then may proceed with Cephalosporin use.    Antimicrobials this admission: Vancomycin 8/2>> Cefepime 8/2>> Ctx x 1 8/2  Dose adjustments this admission: n/a  Microbiology results: 8/2 BCx: sent 8/2 UCx: sent  Bertis Ruddy, PharmD Clinical Pharmacist ED Pharmacist Phone # [phone] 01/15/2020 10:15 PM

## 2020-01-15 NOTE — ED Triage Notes (Signed)
INFO from EMS. Pt from home with ongoing weakness . Pt seen at [facility] on Friday for same. Pt a/O x3  . Pt had a loose stool on arrival to Ed . While cleaning skin an open area on scraum was seen mepelex applied  to area.

## 2020-01-15 NOTE — ED Provider Notes (Signed)
[facility] EMERGENCY DEPARTMENT Provider Note   CSN: [id number] Arrival date & time: 01/15/20  1727     History Chief Complaint  Patient presents with  . Hypotension    Kerry Barnes is a 86 y.o. female.  HPI  Patient presents from home via EMS for worsening weakness.  Family and patient report that the patient has had worsening weakness over the last several weeks to months.  She has been able to ambulate at home resulting in a pressure ulcer on the buttocks.  Patient has had loose stools.  Denies fever, chills, nausea or vomiting.  Decreased p.o. intake as well over the same timeframe.  Family report the patient has a history of bilateral peripheral neuropathy and her leg pain has gotten worse as well.  Denies any injury, falls, or trauma.  No treatments attempted prior to arrival.  Of note, the patient was recently seen in the emergency department for lower extremity pain and discharged at that time.      Past Medical History:  Diagnosis Date  . Anxiety   . Arthritis   . Asthma   . Atypical atrial flutter ([place])   . Cataract   . Chronic diastolic CHF (congestive heart failure) ([place])   . Fibromyalgia   . GERD (gastroesophageal reflux disease)   . History of blood transfusion   . Hyperlipemia   . Hypertension   . IBS (irritable bowel syndrome)   . Seasonal allergies   . Stroke [facility]) 2013   TIA  . Tubular adenoma of colon   . Type 2 diabetes mellitus [facility])     Patient Active Problem List   Diagnosis Date Noted  . Elevated LFTs 01/15/2020  . Acute lower UTI 01/15/2020  . Sacral decubitus ulcer 01/15/2020  . Sepsis ([place]) 01/15/2020  . Nonrheumatic tricuspid valve regurgitation 03/27/2019  . Other secondary pulmonary hypertension ([place]) 03/27/2019  . Laboratory examination 08/29/2018  . Chest pressure 04/23/2018  . Abdominal pain 04/23/2018  . Atypical chest pain   . Epigastric abdominal pain   . AKI (acute kidney injury) ([place]) 04/26/2017  .  Chronic renal insufficiency, stage III (moderate) ([place]) 04/17/2016  . Chronic anticoagulation 02/05/2016  . Chest pain   . Acute kidney injury superimposed on CKD ([place])   . Atrial flutter, paroxysmal ([place]) 01/31/2016  . Asthma with acute exacerbation 09/10/2015  . Difficulty swallowing 09/10/2015  . History of acute bronchitis 09/10/2015  . Moderate persistent asthma 09/10/2015  . Sore throat 09/10/2015  . Left carotid bruit 04/23/2015  . Leg pain 10/10/2013  . Stroke ([place]) 06/16/2011  . Chronic diastolic heart failure ([place]) 11/06/2009  . PULMONARY HYPERTENSION, SECONDARY 07/04/2009  . ARM NUMBNESS 07/04/2009  . Type 2 diabetes with stage 3 chronic kidney disease GFR 30-59 ([place]) 07/03/2009  . Mixed hyperlipidemia 07/03/2009  . MITRAL REGURGITATION, MILD 07/03/2009  . Essential hypertension 07/03/2009  . Gastroparesis 07/03/2009  . DIVERTICULOSIS, COLON, WITH HEMORRHAGE 07/03/2009  . CONSTIPATION 07/03/2009  . IBS 07/03/2009  . ARTHRITIS 07/03/2009  . MIGRAINES, HX OF 07/03/2009  . History of disease 07/03/2009  . EUSTACHIAN TUBE DYSFUNCTION 04/04/2007  . ALLERGIC RHINITIS 04/04/2007  . ASTHMA 04/04/2007  . ESOPHAGITIS, REFLUX 04/04/2007  . Asthma 04/04/2007    Past Surgical History:  Procedure Laterality Date  . ANKLE FRACTURE SURGERY    . CARDIOVERSION N/A 02/04/2016   Procedure: CARDIOVERSION;  Surgeon: Sueanne Margarita, MD;  Location: [facility] ENDOSCOPY;  Service: Cardiovascular;  Laterality: N/A;  . CARDIOVERSION N/A 05/01/2016  Procedure: CARDIOVERSION;  Surgeon: Fay Records, MD;  Location: [facility] ENDOSCOPY;  Service: Cardiovascular;  Laterality: N/A;  . CARDIOVERSION N/A 04/25/2018   Procedure: CARDIOVERSION;  Surgeon: [name], MD;  Location: [facility];  Service: Cardiovascular;  Laterality: N/A;  . CHOLECYSTECTOMY    . TEE WITHOUT CARDIOVERSION N/A 02/04/2016   Procedure: TRANSESOPHAGEAL ECHOCARDIOGRAM (TEE);  Surgeon: Sueanne Margarita, MD;  Location: [facility] ENDOSCOPY;   Service: Cardiovascular;  Laterality: N/A;  . TUBAL LIGATION    . WRIST FRACTURE SURGERY       OB History   No obstetric history on file.     Family History  Problem Relation Age of Onset  . Diabetes Mother   . Hypertension Mother   . Diabetes Father   . Diabetes Sister   . Diabetes Brother   . Hypertension Brother   . Colon cancer Neg Hx   . Stomach cancer Neg Hx     Social History   Tobacco Use  . Smoking status: Never Smoker  . Smokeless tobacco: Never Used  Vaping Use  . Vaping Use: Never used  Substance Use Topics  . Alcohol use: No    Alcohol/week: 0.0 standard drinks  . Drug use: No    Home Medications Prior to Admission medications   Medication Sig Start Date End Date Taking? Authorizing Provider  acetaminophen (TYLENOL) 325 MG tablet Take 650 mg by mouth every 6 (six) hours as needed.    [provider]  albuterol (PROVENTIL HFA;VENTOLIN HFA) 108 (90 BASE) MCG/ACT inhaler Inhale into the lungs every 6 (six) hours as needed for wheezing or shortness of breath.    [provider]  amiodarone (PACERONE) 200 MG tablet TAKE 1 TABLET BY MOUTH TWICE DAILY 08/15/19   Patwardhan, Reynold Bowen, MD  amLODipine (NORVASC) 5 MG tablet  10/17/18   [provider]  atorvastatin (LIPITOR) 40 MG tablet Take 40 mg by mouth daily.    [provider]  cycloSPORINE (RESTASIS) 0.05 % ophthalmic emulsion Apply 1 drop to eye daily. 07/07/18   [provider]  ELIQUIS 2.5 MG TABS tablet TAKE 1 TABLET(2.5 MG) BY MOUTH TWICE DAILY 01/10/20   Patwardhan, Manish J, MD  EPINEPHrine (EPI-PEN) 0.3 mg/0.3 mL SOAJ injection Inject 0.3 mg once as needed into the muscle (severe allergic reaction).     [provider]  gabapentin (NEURONTIN) 100 MG capsule Take 1 capsule (100 mg total) by mouth 3 (three) times daily. 01/12/20   Deno Etienne, DO  JANUVIA 50 MG tablet  10/13/18   [provider]  levalbuterol Penne Lash HFA) 45 MCG/ACT inhaler Inhale  into the lungs.    [provider]  LINZESS 290 MCG CAPS capsule TAKE 1 CAPSULE BY MOUTH DAILY BEFORE BREAKFAST 07/27/19   Pyrtle, Lajuan Lines, MD  methocarbamol (ROBAXIN) 500 MG tablet Take 1 tablet (500 mg total) every 6 (six) hours as needed by mouth for muscle spasms. 04/27/17   Geradine Girt, DO  mometasone (NASONEX) 50 MCG/ACT nasal spray Place 2 sprays into the nose daily as needed (nasal congestion).     [provider]  olopatadine (PATANOL) 0.1 % ophthalmic solution Place 1 drop into both eyes 2 (two) times daily.     [provider]  Propylene Glycol (SYSTANE BALANCE OP) Place 1 drop daily as needed into both eyes (dryness).     [provider]  ranitidine (ZANTAC) 150 MG capsule Take 150 mg by mouth every evening.    [provider]  solifenacin (VESICARE) 5 MG tablet Take 5 mg by mouth daily. 09/02/17   [provider]  trimethoprim-polymyxin b (POLYTRIM) ophthalmic solution Place 1 drop into the left eye 4 (four) times daily. 02/03/18   [provider]    Allergies    Codeine, Metformin, Amoxicillin, Biaxin [clarithromycin], Ciprofloxacin, and Penicillins  Review of Systems   Review of Systems  Constitutional: Positive for fever. Negative for chills.  HENT: Negative for ear pain and sore throat.   Eyes: Negative for pain and visual disturbance.  Respiratory: Negative for cough and shortness of breath.   Cardiovascular: Negative for chest pain and palpitations.  Gastrointestinal: Negative for abdominal pain and vomiting.  Genitourinary: Negative for dysuria and hematuria.  Musculoskeletal: Negative for arthralgias and back pain.  Skin: Negative for color change and rash.  Neurological: Positive for weakness. Negative for seizures and syncope.  All other systems reviewed and are negative.   Physical Exam Updated Vital Signs BP (!) 120/47 (BP Location: Right Arm)   Pulse (!) 59   Temp 99.1 F (37.3 C)   Resp 18    SpO2 100%   Physical Exam Vitals and nursing note reviewed.  Constitutional:      General: She is not in acute distress.    Appearance: She is well-developed and underweight. She is ill-appearing.  HENT:     Head: Normocephalic and atraumatic.     Right Ear: Tympanic membrane normal.     Left Ear: Tympanic membrane normal.  Eyes:     Conjunctiva/sclera: Conjunctivae normal.  Cardiovascular:     Rate and Rhythm: Normal rate and regular rhythm.     Heart sounds: No murmur heard.   Pulmonary:     Effort: Pulmonary effort is normal. No respiratory distress.     Breath sounds: Normal breath sounds.  Abdominal:     Palpations: Abdomen is soft.     Tenderness: There is no abdominal tenderness.  Musculoskeletal:     Cervical back: Normal range of motion and neck supple. No rigidity.  Skin:    General: Skin is warm and dry.  Neurological:     General: No focal deficit present.     Mental Status: She is alert and oriented to person, place, and time. Mental status is at baseline.     Cranial Nerves: No cranial nerve deficit.     Motor: Motor function is intact. No seizure activity.  Psychiatric:        Mood and Affect: Mood normal.        Behavior: Behavior normal.     ED Results / Procedures / Treatments   Labs (all labs ordered are listed, but only abnormal results are displayed) Labs Reviewed  COMPREHENSIVE METABOLIC PANEL - Abnormal; Notable for the following components:      Result Value   Sodium 133 (*)    Glucose, Bld 101 (*)    Creatinine, Ser 1.18 (*)    Calcium 8.3 (*)    Albumin 1.9 (*)    AST 125 (*)    ALT 92 (*)    Total Bilirubin 2.5 (*)    GFR calc non Af Amer 43 (*)    GFR calc Af Amer 49 (*)    All other components within normal limits  LACTIC ACID, PLASMA - Abnormal; Notable for the following components:   Lactic Acid, Venous 2.6 (*)    All other components within normal limits  CBC WITH DIFFERENTIAL/PLATELET - Abnormal; Notable for the following  components:  WBC 20.3 (*)    Hemoglobin 10.5 (*)    HCT 32.2 (*)    RDW 19.8 (*)    Neutro Abs 17.6 (*)    Abs Immature Granulocytes 0.20 (*)    All other components within normal limits  URINALYSIS, ROUTINE W REFLEX MICROSCOPIC - Abnormal; Notable for the following components:   Color, Urine AMBER (*)    APPearance CLOUDY (*)    Hgb urine dipstick MODERATE (*)    Bilirubin Urine SMALL (*)    Protein, ur 30 (*)    Bacteria, UA MANY (*)    All other components within normal limits  SARS CORONAVIRUS 2 BY RT PCR (HOSPITAL ORDER, [facility])  CULTURE, BLOOD (ROUTINE X 2)  CULTURE, BLOOD (ROUTINE X 2)  URINE CULTURE  EXPECTORATED SPUTUM ASSESSMENT W REFEX TO RESP CULTURE  LACTIC ACID, PLASMA  MAGNESIUM  PHOSPHORUS  CK  PROCALCITONIN  ETHANOL  PROTIME-INR  AMMONIA  BLOOD GAS, VENOUS  HEMOGLOBIN A1C  STREP PNEUMONIAE URINARY ANTIGEN  HEPATITIS PANEL, ACUTE  VITAMIN B12  FOLATE  IRON AND TIBC  FERRITIN  RETICULOCYTES  TROPONIN I (HIGH SENSITIVITY)    EKG None  Radiology DG Chest 2 View  Result Date: 01/15/2020 CLINICAL DATA:  Ongoing weakness. EXAM: CHEST - 2 VIEW COMPARISON:  April 23, 2018 FINDINGS: Mild areas of atelectasis and/or infiltrate are seen within the bilateral lung bases, left greater than right. Very small bilateral pleural effusions are seen. No pneumothorax is identified. The cardiac silhouette is mildly enlarged. Mild calcification of the aortic arch is noted. Multilevel degenerative changes seen throughout the thoracic spine. Radiopaque surgical clips are seen within the right upper quadrant. IMPRESSION: 1. Mild bibasilar atelectasis and/or infiltrate, left greater than right. 2. Very small bilateral pleural effusions. Electronically Signed   By: Virgina Norfolk [name]   On: 01/15/2020 18:58    Procedures Procedures (including critical care time)  Medications Ordered in ED Medications  metroNIDAZOLE (FLAGYL) IVPB 500 mg  (500 mg Intravenous New Bag/Given 01/15/20 2221)  insulin aspart (novoLOG) injection 0-9 Units (has no administration in time range)  vancomycin (VANCOCIN) IVPB 1000 mg/200 mL premix (has no administration in time range)  ceFEPIme (MAXIPIME) 2 g in sodium chloride 0.9 % 100 mL IVPB (has no administration in time range)  vancomycin (VANCOREADY) IVPB 500 mg/100 mL (has no administration in time range)  lactated ringers bolus 1,000 mL (0 mLs Intravenous Stopped 01/15/20 2020)  cefTRIAXone (ROCEPHIN) 1 g in sodium chloride 0.9 % 100 mL IVPB (0 g Intravenous Stopped 01/15/20 2219)    ED Course  Kerry Barnes is a 86 y.o. female with PMHx listed that presents to the Emergency Department complaint of Hypotension    ED Course: Initial exam completed.   Chronically ill-appearing.  Hemodynamically unstable with mild hypotension.  Potentially toxic.  Afebrile.  Physical exam significant for age-appropriate 86 year old female with no focal neurologic deficits on examination, extremities perfused, abdomen soft, nondistended, nontender, with generalized weakness.  Initial differential includes dehydration, multiple sources of sepsis including urosepsis/pneumonia, electrolyte abnormalities, UTI, failure to thrive, and metabolic/nutritional abnormalities.  Given these concerns, sepsis order set initiated.   1 L LR bolus for fluid resuscitation.   CMP with mild elevation in LFTs and mild creatinine elevation 1.18 otherwise no acute electrolyte imbalance requiring urgent intervention.  No right upper quadrant pain to palpation.  CBC with nonspecific leukocytosis to 20.3 and anemic hemoglobin 10.5.  Lactic acid 2.6.  Urine many bacteria; culture added.  Magnesium and phosphorus within normal limits.  CXR without evidence of focal consolidation. Blood cultures pending x2.   Discussed with the hospitalist team who will evaluate the patient.  Recommend obtaining CT abdomen pelvis with contrast to assess for intraabdominal  pathology/biliary obstruction.  INR added on.  Repeat lactic acid downtrending appropriately after fluid administration.  After hospitalist evaluation, agreed to admit the patient to their service for further management.  Diagnostics Vital Signs: reviewed Labs: reviewed and significant findings discussed above Imaging: personally reviewed images interpreted by radiology EKG: reviewed  Records: previous records reviewed and pertinent data discussed   Consults:  Hospitalist   Reevaluation/Disposition:  Upon reevaluation, patients symptoms stable.    All questions answered. Patient and/or family was understanding and in agreement with today's assessment and plan.   Sherolyn Buba, MD Emergency Medicine, PGY-3   Note: Dragon medical dictation software was used in the creation of this note.   Final Clinical Impression(s) / ED Diagnoses Final diagnoses:  Urinary tract infection without hematuria, site unspecified  Weakness    Rx / DC Orders ED Discharge Orders    None       Frann Rider, MD 01/15/20 2322    Merrily Pew, MD 01/15/20 7564

## 2020-01-16 ENCOUNTER — Inpatient Hospital Stay (HOSPITAL_COMMUNITY): Payer: HMO

## 2020-01-16 ENCOUNTER — Other Ambulatory Visit: Payer: Self-pay

## 2020-01-16 DIAGNOSIS — N390 Urinary tract infection, site not specified: Secondary | ICD-10-CM | POA: Diagnosis present

## 2020-01-16 DIAGNOSIS — I342 Nonrheumatic mitral (valve) stenosis: Secondary | ICD-10-CM | POA: Diagnosis not present

## 2020-01-16 DIAGNOSIS — E871 Hypo-osmolality and hyponatremia: Secondary | ICD-10-CM | POA: Diagnosis present

## 2020-01-16 DIAGNOSIS — R933 Abnormal findings on diagnostic imaging of other parts of digestive tract: Secondary | ICD-10-CM | POA: Diagnosis not present

## 2020-01-16 DIAGNOSIS — R54 Age-related physical debility: Secondary | ICD-10-CM | POA: Diagnosis present

## 2020-01-16 DIAGNOSIS — Z6821 Body mass index (BMI) 21.0-21.9, adult: Secondary | ICD-10-CM | POA: Diagnosis not present

## 2020-01-16 DIAGNOSIS — Z20822 Contact with and (suspected) exposure to covid-19: Secondary | ICD-10-CM | POA: Diagnosis present

## 2020-01-16 DIAGNOSIS — I340 Nonrheumatic mitral (valve) insufficiency: Secondary | ICD-10-CM

## 2020-01-16 DIAGNOSIS — I361 Nonrheumatic tricuspid (valve) insufficiency: Secondary | ICD-10-CM | POA: Diagnosis not present

## 2020-01-16 DIAGNOSIS — E872 Acidosis: Secondary | ICD-10-CM | POA: Diagnosis present

## 2020-01-16 DIAGNOSIS — R531 Weakness: Secondary | ICD-10-CM | POA: Diagnosis present

## 2020-01-16 DIAGNOSIS — N1831 Chronic kidney disease, stage 3a: Secondary | ICD-10-CM | POA: Diagnosis present

## 2020-01-16 DIAGNOSIS — R627 Adult failure to thrive: Secondary | ICD-10-CM | POA: Diagnosis present

## 2020-01-16 DIAGNOSIS — R579 Shock, unspecified: Secondary | ICD-10-CM

## 2020-01-16 DIAGNOSIS — J189 Pneumonia, unspecified organism: Secondary | ICD-10-CM | POA: Diagnosis present

## 2020-01-16 DIAGNOSIS — L89150 Pressure ulcer of sacral region, unstageable: Secondary | ICD-10-CM | POA: Diagnosis not present

## 2020-01-16 DIAGNOSIS — G92 Toxic encephalopathy: Secondary | ICD-10-CM | POA: Diagnosis present

## 2020-01-16 DIAGNOSIS — I4892 Unspecified atrial flutter: Secondary | ICD-10-CM | POA: Diagnosis present

## 2020-01-16 DIAGNOSIS — Z66 Do not resuscitate: Secondary | ICD-10-CM | POA: Diagnosis present

## 2020-01-16 DIAGNOSIS — I480 Paroxysmal atrial fibrillation: Secondary | ICD-10-CM | POA: Diagnosis present

## 2020-01-16 DIAGNOSIS — E1122 Type 2 diabetes mellitus with diabetic chronic kidney disease: Secondary | ICD-10-CM | POA: Diagnosis present

## 2020-01-16 DIAGNOSIS — D649 Anemia, unspecified: Secondary | ICD-10-CM | POA: Diagnosis not present

## 2020-01-16 DIAGNOSIS — I130 Hypertensive heart and chronic kidney disease with heart failure and stage 1 through stage 4 chronic kidney disease, or unspecified chronic kidney disease: Secondary | ICD-10-CM | POA: Diagnosis present

## 2020-01-16 DIAGNOSIS — R945 Abnormal results of liver function studies: Secondary | ICD-10-CM | POA: Diagnosis not present

## 2020-01-16 DIAGNOSIS — I5033 Acute on chronic diastolic (congestive) heart failure: Secondary | ICD-10-CM | POA: Diagnosis present

## 2020-01-16 DIAGNOSIS — D509 Iron deficiency anemia, unspecified: Secondary | ICD-10-CM | POA: Diagnosis present

## 2020-01-16 DIAGNOSIS — D631 Anemia in chronic kidney disease: Secondary | ICD-10-CM | POA: Diagnosis present

## 2020-01-16 DIAGNOSIS — I959 Hypotension, unspecified: Secondary | ICD-10-CM | POA: Diagnosis present

## 2020-01-16 DIAGNOSIS — D689 Coagulation defect, unspecified: Secondary | ICD-10-CM | POA: Diagnosis present

## 2020-01-16 DIAGNOSIS — E43 Unspecified severe protein-calorie malnutrition: Secondary | ICD-10-CM | POA: Diagnosis present

## 2020-01-16 DIAGNOSIS — E1142 Type 2 diabetes mellitus with diabetic polyneuropathy: Secondary | ICD-10-CM | POA: Diagnosis present

## 2020-01-16 DIAGNOSIS — D72829 Elevated white blood cell count, unspecified: Secondary | ICD-10-CM | POA: Diagnosis not present

## 2020-01-16 DIAGNOSIS — Z515 Encounter for palliative care: Secondary | ICD-10-CM | POA: Diagnosis not present

## 2020-01-16 DIAGNOSIS — N179 Acute kidney failure, unspecified: Secondary | ICD-10-CM | POA: Diagnosis present

## 2020-01-16 LAB — RETICULOCYTES
Immature Retic Fract: 20.4 % — ABNORMAL HIGH (ref 2.3–15.9)
RBC.: 3.41 MIL/uL — ABNORMAL LOW (ref 3.87–5.11)
Retic Count, Absolute: 59 10*3/uL (ref 19.0–186.0)
Retic Ct Pct: 1.7 % (ref 0.4–3.1)

## 2020-01-16 LAB — ECHOCARDIOGRAM COMPLETE
AR max vel: 1.81 cm2
AV Area VTI: 1.71 cm2
AV Area mean vel: 1.53 cm2
AV Mean grad: 5 mmHg
AV Peak grad: 8.8 mmHg
Ao pk vel: 1.48 m/s
Area-P 1/2: 1.8 cm2
Height: 61 in
MV M vel: 4.65 m/s
MV Peak grad: 86.5 mmHg
Radius: 0.4 cm
S' Lateral: 2.8 cm
Weight: 1647.28 oz

## 2020-01-16 LAB — COMPREHENSIVE METABOLIC PANEL
ALT: 53 U/L — ABNORMAL HIGH (ref 0–44)
AST: 68 U/L — ABNORMAL HIGH (ref 15–41)
Albumin: 1.4 g/dL — ABNORMAL LOW (ref 3.5–5.0)
Alkaline Phosphatase: 60 U/L (ref 38–126)
Anion gap: 71 — ABNORMAL HIGH (ref 5–15)
BUN: 12 mg/dL (ref 8–23)
CO2: 12 mmol/L — ABNORMAL LOW (ref 22–32)
Calcium: <4 mg/dL — CL (ref 8.9–10.3)
Chloride: 81 mmol/L — ABNORMAL LOW (ref 98–111)
Creatinine, Ser: 0.67 mg/dL (ref 0.44–1.00)
GFR calc Af Amer: >60 mL/min (ref >60)
GFR calc non Af Amer: >60 mL/min (ref >60)
Glucose, Bld: 72 mg/dL (ref 70–99)
Potassium: 3.2 mmol/L — ABNORMAL LOW (ref 3.5–5.1)
Sodium: 164 mmol/L (ref 135–145)
Total Bilirubin: 1.5 mg/dL — ABNORMAL HIGH (ref 0.3–1.2)
Total Protein: 4.5 g/dL — ABNORMAL LOW (ref 6.5–8.1)

## 2020-01-16 LAB — BLOOD GAS, VENOUS
Acid-base deficit: 2.1 mmol/L — ABNORMAL HIGH (ref 0.0–2.0)
Bicarbonate: 21.8 mmol/L (ref 20.0–28.0)
Drawn by: 4367
FIO2: 21
O2 Saturation: 49.5 %
Patient temperature: 37.1
pCO2, Ven: 34.4 mmHg — ABNORMAL LOW (ref 44.0–60.0)
pH, Ven: 7.418 (ref 7.250–7.430)
pO2, Ven: <31 mmHg — CL (ref 32.0–45.0)

## 2020-01-16 LAB — CBC WITH DIFFERENTIAL/PLATELET
Abs Immature Granulocytes: 0.19 10*3/uL — ABNORMAL HIGH (ref 0.00–0.07)
Basophils Absolute: 0 10*3/uL (ref 0.0–0.1)
Basophils Relative: 0 %
Eosinophils Absolute: 0 10*3/uL (ref 0.0–0.5)
Eosinophils Relative: 0 %
HCT: 24.1 % — ABNORMAL LOW (ref 36.0–46.0)
Hemoglobin: 7.7 g/dL — ABNORMAL LOW (ref 12.0–15.0)
Immature Granulocytes: 1 %
Lymphocytes Relative: 6 %
Lymphs Abs: 1 10*3/uL (ref 0.7–4.0)
MCH: 25.7 pg — ABNORMAL LOW (ref 26.0–34.0)
MCHC: 32 g/dL (ref 30.0–36.0)
MCV: 80.3 fL (ref 80.0–100.0)
Monocytes Absolute: 0.6 10*3/uL (ref 0.1–1.0)
Monocytes Relative: 3 %
Neutro Abs: 15.4 10*3/uL — ABNORMAL HIGH (ref 1.7–7.7)
Neutrophils Relative %: 90 %
Platelets: 268 10*3/uL (ref 150–400)
RBC: 3 MIL/uL — ABNORMAL LOW (ref 3.87–5.11)
RDW: 19.8 % — ABNORMAL HIGH (ref 11.5–15.5)
WBC: 17.3 10*3/uL — ABNORMAL HIGH (ref 4.0–10.5)
nRBC: 0 % (ref 0.0–0.2)

## 2020-01-16 LAB — BASIC METABOLIC PANEL
Anion gap: 8 (ref 5–15)
BUN: 14 mg/dL (ref 8–23)
CO2: 19 mmol/L — ABNORMAL LOW (ref 22–32)
Calcium: 7.3 mg/dL — ABNORMAL LOW (ref 8.9–10.3)
Chloride: 106 mmol/L (ref 98–111)
Creatinine, Ser: 0.83 mg/dL (ref 0.44–1.00)
GFR calc Af Amer: >60 mL/min (ref >60)
GFR calc non Af Amer: >60 mL/min (ref >60)
Glucose, Bld: 91 mg/dL (ref 70–99)
Potassium: 3.5 mmol/L (ref 3.5–5.1)
Sodium: 133 mmol/L — ABNORMAL LOW (ref 135–145)

## 2020-01-16 LAB — AMMONIA: Ammonia: 21 umol/L (ref 9–35)

## 2020-01-16 LAB — IRON AND TIBC: Iron: 16 ug/dL — ABNORMAL LOW (ref 28–170)

## 2020-01-16 LAB — TSH: TSH: 1.277 u[IU]/mL (ref 0.350–4.500)

## 2020-01-16 LAB — GLUCOSE, CAPILLARY
Glucose-Capillary: 102 mg/dL — ABNORMAL HIGH (ref 70–99)
Glucose-Capillary: 92 mg/dL (ref 70–99)
Glucose-Capillary: 101 mg/dL — ABNORMAL HIGH (ref 70–99)
Glucose-Capillary: 102 mg/dL — ABNORMAL HIGH (ref 70–99)
Glucose-Capillary: 111 mg/dL — ABNORMAL HIGH (ref 70–99)
Glucose-Capillary: 92 mg/dL (ref 70–99)
Glucose-Capillary: 96 mg/dL (ref 70–99)
Glucose-Capillary: 90 mg/dL (ref 70–99)

## 2020-01-16 LAB — CK: Total CK: 534 U/L — ABNORMAL HIGH (ref 38–234)

## 2020-01-16 LAB — FOLATE: Folate: 6.5 ng/mL (ref >5.9)

## 2020-01-16 LAB — CBG MONITORING, ED
Glucose-Capillary: 101 mg/dL — ABNORMAL HIGH (ref 70–99)
Glucose-Capillary: 111 mg/dL — ABNORMAL HIGH (ref 70–99)

## 2020-01-16 LAB — HEPATITIS PANEL, ACUTE
HCV Ab: NONREACTIVE
Hep A IgM: NONREACTIVE
Hep B C IgM: NONREACTIVE
Hepatitis B Surface Ag: NONREACTIVE

## 2020-01-16 LAB — ETHANOL: Alcohol, Ethyl (B): <10 mg/dL (ref <10)

## 2020-01-16 LAB — MRSA PCR SCREENING: MRSA by PCR: NEGATIVE

## 2020-01-16 LAB — PROTIME-INR
INR: 1.5 — ABNORMAL HIGH (ref 0.8–1.2)
Prothrombin Time: 17.7 seconds — ABNORMAL HIGH (ref 11.4–15.2)

## 2020-01-16 LAB — HEMOGLOBIN A1C
Hgb A1c MFr Bld: 5.1 % (ref 4.8–5.6)
Mean Plasma Glucose: 99.67 mg/dL

## 2020-01-16 LAB — PROCALCITONIN: Procalcitonin: 0.63 ng/mL

## 2020-01-16 LAB — FERRITIN: Ferritin: 2258 ng/mL — ABNORMAL HIGH (ref 11–307)

## 2020-01-16 LAB — TROPONIN I (HIGH SENSITIVITY): Troponin I (High Sensitivity): 26 ng/L — ABNORMAL HIGH (ref <18)

## 2020-01-16 LAB — VITAMIN B12: Vitamin B-12: 491 pg/mL (ref 180–914)

## 2020-01-16 MED ORDER — COLLAGENASE 250 UNIT/GM EX OINT
TOPICAL_OINTMENT | Freq: Every day | CUTANEOUS | Status: DC
  Filled 2020-01-16 (×2): qty 30

## 2020-01-16 MED ORDER — ALBUMIN HUMAN 5 % IV SOLN
12.5000 g | Freq: Once | INTRAVENOUS | Status: AC
  Administered 2020-01-16: 12.5 g via INTRAVENOUS
  Filled 2020-01-16: qty 250

## 2020-01-16 MED ORDER — POLYETHYLENE GLYCOL 3350 17 G PO PACK: 17.0000 g | PACK | Freq: Every day | ORAL | Status: DC | PRN

## 2020-01-16 MED ORDER — LEVOFLOXACIN IN D5W 500 MG/100ML IV SOLN
500.0000 mg | INTRAVENOUS | Status: DC
  Administered 2020-01-16 – 2020-01-18 (×2): 500 mg via INTRAVENOUS
  Filled 2020-01-16 (×3): qty 100

## 2020-01-16 MED ORDER — DOCUSATE SODIUM 100 MG PO CAPS
100.0000 mg | ORAL_CAPSULE | Freq: Two times a day (BID) | ORAL | Status: DC | PRN
  Administered 2020-01-19: 100 mg via ORAL

## 2020-01-16 MED ORDER — SODIUM CHLORIDE 0.9 % IV BOLUS
1000.0000 mL | Freq: Once | INTRAVENOUS | Status: AC
  Administered 2020-01-16: 1000 mL via INTRAVENOUS

## 2020-01-16 MED ORDER — ORAL CARE MOUTH RINSE
15.0000 mL | Freq: Two times a day (BID) | OROMUCOSAL | Status: DC
  Administered 2020-01-16 – 2020-01-25 (×12): 15 mL via OROMUCOSAL

## 2020-01-16 MED ORDER — APIXABAN 2.5 MG PO TABS
2.5000 mg | ORAL_TABLET | Freq: Two times a day (BID) | ORAL | Status: DC
  Filled 2020-01-16 (×2): qty 1

## 2020-01-16 NOTE — Progress Notes (Signed)
Dr. Tacy Learn informed of patient's Na+ of 164 and Ca+ of <4 and gave verbal order to repeat lab work. New order for BMP placed.

## 2020-01-16 NOTE — Evaluation (Signed)
Physical Therapy Evaluation Patient Details Name: Kerry Barnes MRN: [id number] DOB: 08/22/1933 Today's Date: 01/16/2020   History of Present Illness  86 yo female with PMH significant for CKD stage III, Afib on apixaban, chronic diastolic heart failure, HTN, HLD, DM type II admitted 8/2 with shock despite IVF resuscitation.  Dx include acute toxic metabolic encephalopathy, elevated LFTs, concern for colonic lesion.   Clinical Impression  Pt admitted with/for shock with encephalopathy.  Pt presents extremely weak needing mod to max assist to accomplish bed mobility and transfers..  Pt currently limited functionally due to the problems listed. ( See problems list.)   Pt will benefit from PT to maximize function and safety in order to get ready for next venue listed below.     Follow Up Recommendations SNF;Supervision/Assistance - 24 hour    Equipment Recommendations  Other (comment) (TBA)    Recommendations for Other Services       Precautions / Restrictions Precautions Precautions: Fall Restrictions Weight Bearing Restrictions: No      Mobility  Bed Mobility Overal bed mobility: Needs Assistance Bed Mobility: Sit to Sidelying     Supine to sit: Max assist   Sit to sidelying: Max assist General bed mobility comments: cues for technique, hand over hand  assist with UE's and assisting LE's up into bed.  Transfers Overall transfer level: Needs assistance Equipment used:  (1 person face to face transfer) Transfers: Sit to/from [facility] Sit to Stand: Mod assist Stand pivot transfers: Mod assist       General transfer comment: modA to powerup into standing with face to face transfer, modA to pivot from recliner to bed.  Ambulation/Gait                Stairs            Wheelchair Mobility    Modified Rankin (Stroke Patients Only)       Balance Overall balance assessment: Needs assistance Sitting-balance support: Bilateral upper  extremity supported;Feet supported Sitting balance-Leahy Scale: Fair Sitting balance - Comments: unable ot tolerate challenge including handling to "rake" back on the chair.  She can sit EOB without UE's   Standing balance support: Bilateral upper extremity supported;During functional activity Standing balance-Leahy Scale: Poor Standing balance comment: reliant on BUE support                             Pertinent Vitals/Pain Pain Assessment: No/denies pain    Home Living Family/patient expects to be discharged to:: Private residence Living Arrangements: Spouse/significant other Available Help at Discharge: Family;Available PRN/intermittently Type of Home: House Home Access: Level entry     Home Layout: One level Home Equipment: Grab bars - tub/shower;Walker - 2 wheels;Cane - single point      Prior Function Level of Independence: Needs assistance   Gait / Transfers Assistance Needed: uses cane for mobiltiy  ADL's / Homemaking Assistance Needed: daughter assists pt in and out of shower;otherwise pt independent;independent with medication management        Hand Dominance   Dominant Hand: Right    Extremity/Trunk Assessment   Upper Extremity Assessment Upper Extremity Assessment: Generalized weakness    Lower Extremity Assessment Lower Extremity Assessment: Generalized weakness    Cervical / Trunk Assessment Cervical / Trunk Assessment: Kyphotic  Communication   Communication: No difficulties  Cognition Arousal/Alertness: Awake/alert Behavior During Therapy: Flat affect Overall Cognitive Status: No family/caregiver present to determine baseline cognitive functioning Area  of Impairment: Orientation;Attention;Following commands;Safety/judgement                 Orientation Level: Disoriented to;Place;Time;Situation Current Attention Level: Focused   Following Commands: Follows one step commands with increased time;Follows one step commands  consistently Safety/Judgement: Decreased awareness of safety;Decreased awareness of deficits     General Comments: pt following commands consistently, slow to respond, pt oriented to place and self only, stated month was march;      General Comments General comments (skin integrity, edema, etc.): vss    Exercises Other Exercises Other Exercises: warm up ROM for stiff knees prior to mobility   Assessment/Plan    PT Assessment Patient needs continued PT services  PT Problem List Decreased strength;Decreased activity tolerance;Decreased balance;Decreased mobility;Decreased knowledge of use of DME       PT Treatment Interventions DME instruction;Gait training;Functional mobility training;Therapeutic activities;Balance training;Patient/family education    PT Goals (Current goals can be found in the Care Plan section)  Acute Rehab PT Goals Patient Stated Goal: pt did not state PT Goal Formulation: Patient unable to participate in goal setting Time For Goal Achievement: 01/30/20 Potential to Achieve Goals: Fair    Frequency Min 2X/week   Barriers to discharge        Co-evaluation               AM-PAC PT "6 Clicks" Mobility  Outcome Measure Help needed turning from your back to your side while in a flat bed without using bedrails?: Total Help needed moving from lying on your back to sitting on the side of a flat bed without using bedrails?: Total Help needed moving to and from a bed to a chair (including a wheelchair)?: A Lot Help needed standing up from a chair using your arms (e.g., wheelchair or bedside chair)?: A Lot Help needed to walk in hospital room?: A Lot Help needed climbing 3-5 steps with a railing? : A Lot 6 Click Score: 10    End of Session   Activity Tolerance: Patient tolerated treatment well;Patient limited by fatigue Patient left: in bed;with call bell/phone within reach;with nursing/sitter in room Nurse Communication: Mobility status PT Visit  Diagnosis: Unsteadiness on feet (R26.81);Muscle weakness (generalized) (M62.81);Other abnormalities of gait and mobility (R26.89)    Time: 4403-4742 PT Time Calculation (min) (ACUTE ONLY): 17 min   Charges:   PT Evaluation $PT Eval Moderate Complexity: 1 Mod          01/16/2020  Ginger Carne., PT Acute Rehabilitation Services [phone]  (pager) [phone]  (office)  Tessie Fass Roen [place]gowan 01/16/2020, 4:40 PM

## 2020-01-16 NOTE — Progress Notes (Signed)
Nurse paged re: Low Bps that have persisted after two 1 L boluses in the ED. Patient was also given albumin in the ED. The concern is that this patient may need to be on pressors. I personally called and spoke with Dr. Gillermina Phy at [place]. He plans to notify the bedside team to come see this patient, and they will be in touch.

## 2020-01-16 NOTE — Progress Notes (Signed)
Transfer Note:   Traveling Method: Bed Transferring Unit: 86M Mental Orientation: A&O X2 Telemetry: 18M04 Assessment: See Flowsheets Skin: See Flowsheets IV: WDL Pain: Denies Tubes: Purewick placed Safety Measures: Safety Fall Prevention Plan has been given, discussed and signed Admission: Completed 18M Orientation: Patient has been orientated to the room, unit and staff.   Orders have been reviewed and implemented. Will continue to monitor the patient. Call light has been placed within reach and bed alarm has been activated.   Aneta Mins BSN, RN3

## 2020-01-16 NOTE — ED Notes (Signed)
[facility] contacted regarding pt's BP, to contact PCCM for potential consult

## 2020-01-16 NOTE — Progress Notes (Signed)
Report given to [place], RN on 23M

## 2020-01-16 NOTE — Progress Notes (Signed)
ANTICOAGULATION CONSULT NOTE - Initial Consult  Pharmacy Consult for Eliquis  Indication: atrial fibrillation  Allergies  Allergen Reactions  . Codeine Nausea Only  . Metformin Diarrhea  . Amoxicillin Rash  . Biaxin [Clarithromycin] Rash  . Ciprofloxacin Rash  . Penicillins Rash    Childhood allergic reaction Has patient had a PCN reaction causing immediate rash, facial/tongue/throat swelling, SOB or lightheadedness with hypotension: Yes Has patient had a PCN reaction causing severe rash involving mucus membranes or skin necrosis: No Has patient had a PCN reaction that required hospitalization: Yes Has patient had a PCN reaction occurring within the last 10 years: No If all of the above answers are "NO", then may proceed with Cephalosporin use.    Patient Measurements:    Vital Signs: Temp: 99.1 F (37.3 C) (08/02 1731) BP: 102/45 (08/03 0030) Pulse Rate: 55 (08/03 0030)  Labs: Recent Labs    01/15/20 1901 01/15/20 2337  HGB 10.5*  --   HCT 32.2*  --   PLT 334  --   CREATININE 1.18*  --   CKTOTAL  --  534*    CrCl cannot be calculated (Unknown ideal weight.).   Medical History: Past Medical History:  Diagnosis Date  . Anxiety   . Arthritis   . Asthma   . Atypical atrial flutter ([place])   . Cataract   . Chronic diastolic CHF (congestive heart failure) ([place])   . Fibromyalgia   . GERD (gastroesophageal reflux disease)   . History of blood transfusion   . Hyperlipemia   . Hypertension   . IBS (irritable bowel syndrome)   . Seasonal allergies   . Stroke [facility]) 2013   TIA  . Tubular adenoma of colon   . Type 2 diabetes mellitus (HCC)     Medications:  No current facility-administered medications on file prior to encounter.   Current Outpatient Medications on File Prior to Encounter  Medication Sig Dispense Refill  . acetaminophen (TYLENOL) 325 MG tablet Take 650 mg by mouth every 6 (six) hours as needed.    Marland Kitchen albuterol (PROVENTIL HFA;VENTOLIN HFA) 108  (90 BASE) MCG/ACT inhaler Inhale into the lungs every 6 (six) hours as needed for wheezing or shortness of breath.    Marland Kitchen amiodarone (PACERONE) 200 MG tablet TAKE 1 TABLET BY MOUTH TWICE DAILY 180 tablet 2  . amLODipine (NORVASC) 5 MG tablet     . atorvastatin (LIPITOR) 40 MG tablet Take 40 mg by mouth daily.    . cycloSPORINE (RESTASIS) 0.05 % ophthalmic emulsion Apply 1 drop to eye daily.    Marland Kitchen ELIQUIS 2.5 MG TABS tablet TAKE 1 TABLET(2.5 MG) BY MOUTH TWICE DAILY 60 tablet 3  . EPINEPHrine (EPI-PEN) 0.3 mg/0.3 mL SOAJ injection Inject 0.3 mg once as needed into the muscle (severe allergic reaction).     . gabapentin (NEURONTIN) 100 MG capsule Take 1 capsule (100 mg total) by mouth 3 (three) times daily. 60 capsule 0  . JANUVIA 50 MG tablet     . levalbuterol (XOPENEX HFA) 45 MCG/ACT inhaler Inhale into the lungs.    Marland Kitchen LINZESS 290 MCG CAPS capsule TAKE 1 CAPSULE BY MOUTH DAILY BEFORE BREAKFAST 90 capsule 0  . methocarbamol (ROBAXIN) 500 MG tablet Take 1 tablet (500 mg total) every 6 (six) hours as needed by mouth for muscle spasms. 15 tablet 0  . mometasone (NASONEX) 50 MCG/ACT nasal spray Place 2 sprays into the nose daily as needed (nasal congestion).     Marland Kitchen olopatadine (PATANOL) 0.1 %  ophthalmic solution Place 1 drop into both eyes 2 (two) times daily.     Marland Kitchen Propylene Glycol (SYSTANE BALANCE OP) Place 1 drop daily as needed into both eyes (dryness).     . ranitidine (ZANTAC) 150 MG capsule Take 150 mg by mouth every evening.    . solifenacin (VESICARE) 5 MG tablet Take 5 mg by mouth daily.    Marland Kitchen trimethoprim-polymyxin b (POLYTRIM) ophthalmic solution Place 1 drop into the left eye 4 (four) times daily.  12     Assessment: 86 y.o. female admitted with hypotension/sepsis, h/o Afib/flutter, to continue Eliquis   Plan:  Eliquis 2.5 mg BID  Edith Groleau, [place] Curb 01/16/2020,1:18 AM

## 2020-01-16 NOTE — Patient Outreach (Signed)
  [facility]) Care Management Chronic Special Needs Program    01/16/2020  Name: Kerry Barnes, DOB: 01-Oct-1933  MRN: [id number]   Ms. [name] is enrolled in a chronic special needs plan for Heart Failure. Client was admitted to [facility] for urinary tract infection, shock, and weakness Individualized care plan sent to [facility] for admission  Assigned RNCM will follow post discharge. Client on [facility] list but is not engaged Plan to communicate with [facility] to outreach client post discharge  Peter Garter RN, [facility], [facility] [phone]

## 2020-01-16 NOTE — ED Notes (Signed)
I made Megan, RN aware that pt CBG is 111

## 2020-01-16 NOTE — H&P (Addendum)
NAME:  Kerry Barnes, MRN:  [id number], DOB:  Jun 14, 1934, LOS: 0 ADMISSION DATE:  01/15/2020, CONSULTATION DATE: 01/16/2020 REFERRING MD: Leonie Man CHIEF COMPLAINT:  Shock  Brief History   86 yo female with PMH significant for CKD stage III, Afib on apixaban, chronic diastolic heart failure, HTN, HLD, DM type II admitted with shock despite IVF resuscitation.  History of present illness   Patient seen in ED on 7/30 for complaints of BLE pain. BLE ultrasound negative for DVT. Patient was given gabapentin for neuropathy and discharged home.  Patient presented to ED today for complaints of ongoing weakness. VS on presentation: Temp 99.1, HR: 60,BP: 89/46, SpO2 100% on room air.  Labs notable for elevated lactate 2.6, leukocytosis with WBC 20, Na 133, K 4.2, BUN 22, Creatinine 1.18, AST: 125, ALT 92.  Prolactin 0.63. She was pan cultured in the ED.  She received: 250 ml 5% albumin, 2 L LR, Vanc and Cefepime. Patient evaluated in ED, she is lethargic but responsive to voice.  Moves all extremities equally, but diffusely weak.  She is confused to year, able to state she is at [facility] for "neuropathy."  Due to lethargy she has very limited participation in exam.    Past Medical History  CKD, stage II, Afib on apixaban, chronic diastolic heart failure, HTN, HLD    Significant Hospital Events   8/3: Admit   Consults:  8/3: PCCM  Procedures:    Significant Diagnostic Tests:  8/3: CT Abdomen and Pelvis: some small bowel and scattered colonic wall thickening.  ? Enteritis. Dense consolidation bilaterally.  there is a change in caliber of the colon at the level of the splenic flexure possibly indicating a focal lesion. Consider endoscopy if clinically indicated to exclude colon cancer  Micro Data:  01/15/20: Blood Cx >> 01/15/20: Urine Cx >> 01/16/20: COVID >> negative   Antimicrobials:  Rocephin 8/2 Vanc 8/2 >> Cefepime 8/2 >> Flagyl 8/2 >>  Interim history/subjective:  New  admit, see above  Objective   Blood pressure (!) 93/51, pulse (!) 52, temperature 99.1 F (37.3 C), resp. rate 17, SpO2 100 %.        Intake/Output Summary (Last 24 hours) at 01/16/2020 0441 Last data filed at 01/16/2020 0130 Gross per 24 hour  Intake 2300 ml  Output -  Net 2300 ml   There were no vitals filed for this visit.  Examination: General: elderly female, apathetic, resting in bed HENT: [place]/AT Lungs: symmetric expansion, clear bilaterally, no labored or increased work of breathing Cardiovascular: No edema, S1/S2. 2+ DP bilaterally.  Abdomen: soft, non distended,  Extremities: no deformities Neuro: arouses to voice/tactile stimulation, eyes open when requesting, moves all extremities, no drift in upper extremities. Strength 3/5 in upper and lowers. Confused to year.  Resolved Hospital Problem list     Assessment & Plan:  Shock,  Undifferentiated --+ leukocytosis, elevated lactate, and low grade temp: most concerning for sepsis of unclear source vs hypovolemia.   --Potential sources: enteritis, pneumonia  --s/p 2 L LR and 250 ml 5% albumin (40 ml/Kg IVF) --pro calcitonin 0.63 Plan: --Follow up Blood Cx, Urine, Urine legionella and Strep --Thyroid panel pending --Continue empiric abx with Vanc/Cefepime/Flagyl. MRSA nares pending --ECHO pending --Currently receiving albumin with MAP 65, if ongoing hypotension will add levophed  Acute encephalopathy, toxic metabolic --no focal deficits on exam Plan: --Discontinue gabapentin in setting of AKI --Thyroid panel pending  Elevated LFT's --likely secondary to shock state --CT Abdomen and pelvis without  focal liver abnormality --Hepatitis panel pending --Continue to trend  pAfib --Home meds: Apixban and amio --Continue home meds  HTN, h/o --Hold home Norvasc  Chronic diastolic heart failure --Repeat ECHO pending  CKD, stage III --Creatinine 0.95 on 7/30 and elevated to 1.18 on presentation. Of note, elevated to  1.3 in previous years --Continue supportive care and IVF resuscitation  DM, type II --Monitor on SSI, hold home PO agents  Concern for colonic lesion --CT Abdomen with change in caliber on  of the colon at the level of the splenic flexure possibly indicating a focal lesion. Consider GI evaluation  H/o asthma --No acute exacerbation --PRN duo nebs available   Best practice:  Diet: NPO, request SLP eval Pain/Anxiety/Delirium protocol (if indicated): Hold home gabapentin VAP protocol (if indicated): n/a DVT prophylaxis: Eliquis  GI prophylaxis: pepcid Glucose control: SSI Mobility: ambulate with assist Code Status:FULL Disposition: ICU.   Labs   CBC: Recent Labs  Lab 01/12/20 1619 01/15/20 1901  WBC 13.1* 20.3*  NEUTROABS  --  17.6*  HGB 11.5* 10.5*  HCT 35.1* 32.2*  MCV 81.1 80.7  PLT 373 818    Basic Metabolic Panel: Recent Labs  Lab 01/12/20 1534 01/15/20 1901  NA 135 133*  K 4.0 4.2  CL 101 101  CO2 22 23  GLUCOSE 97 101*  BUN 17 22  CREATININE 0.95 1.18*  CALCIUM 8.4* 8.3*  MG  --  1.9  PHOS  --  2.7   GFR: CrCl cannot be calculated (Unknown ideal weight.). Recent Labs  Lab 01/12/20 1619 01/15/20 1901 01/15/20 2208 01/15/20 2337  PROCALCITON  --   --   --  0.63  WBC 13.1* 20.3*  --   --   LATICACIDVEN  --  2.6* 1.6  --     Liver Function Tests: Recent Labs  Lab 01/15/20 1901  AST 125*  ALT 92*  ALKPHOS 101  BILITOT 2.5*  PROT 7.0  ALBUMIN 1.9*   No results for input(s): LIPASE, AMYLASE in the last 168 hours. Recent Labs  Lab 01/15/20 2237  AMMONIA 21    ABG    Component Value Date/Time   HCO3 25.6 (H) 03/10/2010 1453   TCO2 24 04/26/2017 1329   ACIDBASEDEF 1.0 03/10/2010 1446   O2SAT 95.0 03/10/2010 1453     Coagulation Profile: No results for input(s): INR, PROTIME in the last 168 hours.  Cardiac Enzymes: Recent Labs  Lab 01/15/20 2337  CKTOTAL 534*    HbA1C: Hgb A1c MFr Bld  Date/Time Value Ref Range Status   09/08/2010 03:29 PM (H) <5.7 % Final   6.3 (NOTE)                                                                       According to the ADA Clinical Practice Recommendations for 2011, when HbA1c is used as a screening test:   >=6.5%   Diagnostic of Diabetes Mellitus           (if abnormal result  is confirmed)  5.7-6.4%   Increased risk of developing Diabetes Mellitus  References:Diagnosis and Classification of Diabetes Mellitus,Diabetes HUDJ,4970,26(VZCHY 1):S62-S69 and Standards of Medical Care in         Diabetes - 2011,Diabetes IFOY,7741,28  (Suppl  1):S11-S61.    CBG: Recent Labs  Lab 01/15/20 2346 01/16/20 0402  GLUCAP 101* 111*    Review of Systems:   Unable to obtain due to patient lethargy and confusion  Past Medical History  She,  has a past medical history of Anxiety, Arthritis, Asthma, Atypical atrial flutter ([place]), Cataract, Chronic diastolic CHF (congestive heart failure) ([place]), Fibromyalgia, GERD (gastroesophageal reflux disease), History of blood transfusion, Hyperlipemia, Hypertension, IBS (irritable bowel syndrome), Seasonal allergies, Stroke ([place]) (2013), Tubular adenoma of colon, and Type 2 diabetes mellitus ([place]).   Surgical History    Past Surgical History:  Procedure Laterality Date  . ANKLE FRACTURE SURGERY    . CARDIOVERSION N/A 02/04/2016   Procedure: CARDIOVERSION;  Surgeon: Sueanne Margarita, MD;  Location[facility] ENDOSCOPY;  Service: Cardiovascular;  Laterality: N/A;  . CARDIOVERSION N/A 05/01/2016   Procedure: CARDIOVERSION;  Surgeon: Fay Records, MD;  Location: [facility] ENDOSCOPY;  Service: Cardiovascular;  Laterality: N/A;  . CARDIOVERSION N/A 04/25/2018   Procedure: CARDIOVERSION;  Surgeon: [name], MD;  Location: [place];  Service: Cardiovascular;  Laterality: N/A;  . CHOLECYSTECTOMY    . TEE WITHOUT CARDIOVERSION N/A 02/04/2016   Procedure: TRANSESOPHAGEAL ECHOCARDIOGRAM (TEE);  Surgeon: Sueanne Margarita, MD;  Location: [facility] ENDOSCOPY;  Service:  Cardiovascular;  Laterality: N/A;  . TUBAL LIGATION    . WRIST FRACTURE SURGERY       Social History   reports that she has never smoked. She has never used smokeless tobacco. She reports that she does not drink alcohol and does not use drugs.   Family History   Her family history includes Diabetes in her brother, father, mother, and sister; Hypertension in her brother and mother. There is no history of Colon cancer or Stomach cancer.   Allergies Allergies  Allergen Reactions  . Codeine Nausea Only  . Metformin Diarrhea  . Amoxicillin Rash  . Biaxin [Clarithromycin] Rash  . Ciprofloxacin Rash  . Penicillins Rash    Childhood allergic reaction Has patient had a PCN reaction causing immediate rash, facial/tongue/throat swelling, SOB or lightheadedness with hypotension: Yes Has patient had a PCN reaction causing severe rash involving mucus membranes or skin necrosis: No Has patient had a PCN reaction that required hospitalization: Yes Has patient had a PCN reaction occurring within the last 10 years: No If all of the above answers are "NO", then may proceed with Cephalosporin use.     Home Medications  Prior to Admission medications   Medication Sig Start Date End Date Taking? Authorizing Provider  acetaminophen (TYLENOL) 325 MG tablet Take 650 mg by mouth every 6 (six) hours as needed.    [provider]  albuterol (PROVENTIL HFA;VENTOLIN HFA) 108 (90 BASE) MCG/ACT inhaler Inhale into the lungs every 6 (six) hours as needed for wheezing or shortness of breath.    [provider]  amiodarone (PACERONE) 200 MG tablet TAKE 1 TABLET BY MOUTH TWICE DAILY 08/15/19   Patwardhan, Reynold Bowen, MD  amLODipine (NORVASC) 5 MG tablet  10/17/18   [provider]  atorvastatin (LIPITOR) 40 MG tablet Take 40 mg by mouth daily.    [provider]  cycloSPORINE (RESTASIS) 0.05 % ophthalmic emulsion Apply 1 drop to eye daily. 07/07/18   [provider]  ELIQUIS  2.5 MG TABS tablet TAKE 1 TABLET(2.5 MG) BY MOUTH TWICE DAILY 01/10/20   Patwardhan, Manish J, MD  EPINEPHrine (EPI-PEN) 0.3 mg/0.3 mL SOAJ injection Inject 0.3 mg once as needed into the muscle (severe allergic reaction).  [provider]  gabapentin (NEURONTIN) 100 MG capsule Take 1 capsule (100 mg total) by mouth 3 (three) times daily. 01/12/20   Deno Etienne, DO  JANUVIA 50 MG tablet  10/13/18   [provider]  levalbuterol Penne Lash HFA) 45 MCG/ACT inhaler Inhale into the lungs.    [provider]  LINZESS 290 MCG CAPS capsule TAKE 1 CAPSULE BY MOUTH DAILY BEFORE BREAKFAST 07/27/19   Pyrtle, Lajuan Lines, MD  methocarbamol (ROBAXIN) 500 MG tablet Take 1 tablet (500 mg total) every 6 (six) hours as needed by mouth for muscle spasms. 04/27/17   Geradine Girt, DO  mometasone (NASONEX) 50 MCG/ACT nasal spray Place 2 sprays into the nose daily as needed (nasal congestion).     [provider]  olopatadine (PATANOL) 0.1 % ophthalmic solution Place 1 drop into both eyes 2 (two) times daily.     [provider]  Propylene Glycol (SYSTANE BALANCE OP) Place 1 drop daily as needed into both eyes (dryness).     [provider]  ranitidine (ZANTAC) 150 MG capsule Take 150 mg by mouth every evening.    [provider]  solifenacin (VESICARE) 5 MG tablet Take 5 mg by mouth daily. 09/02/17   [provider]  trimethoprim-polymyxin b (POLYTRIM) ophthalmic solution Place 1 drop into the left eye 4 (four) times daily. 02/03/18   [provider]     Critical care time: 54      PCCM:  86 yo FM, PMH CKDIII, Afib on api, Chronic diastolic HF, HTN, HLD, DMII.   Presented to ED with undifferentiated shock. Was given IVFs and BP still soft. Decision made to bring to ICU for obs. And possible initation of pressors   BP (!) 101/46 (BP Location: Left Arm)   Pulse (!) 55   Temp 98 F (36.7 C) (Oral)   Resp 19   Ht 5\' 1"  (1.549 m)   Wt 46.7  kg   SpO2 96%   BMI 19.45 kg/m   Gen: elderly fm, resting in bed conversant  HENT: tracking appropriately, no JVD  Heart: RRR s1 s2  Lungs: CTAB   Labs: Reviewed  WBC elevated  Hgb 7.7  A:  Sepsis, possible septic shock  Anemia, however no signs of bleeding  Poor po intake per patient for days  Lactic acidosis  Elevated PCT  Hyponatremia  AKI  Possible colon lesion   P:  Fluid resuscitation given  If needed will start pressors  Follow H&H  Follow up on cultures  Continue cefepime, vanco, metro  Hold eliquis until Hgb stable to ensure no bleeding  Will need endoscopic eval of colon at somepoint   This patient is critically ill with multiple organ system failure; which, requires frequent high complexity decision making, assessment, support, evaluation, and titration of therapies. This was completed through the application of advanced monitoring technologies and extensive interpretation of multiple databases. During this encounter critical care time was devoted to patient care services described in this note for 32 minutes.  Garner Nash, DO [place] [facility] 01/16/2020 6:45 AM

## 2020-01-16 NOTE — Progress Notes (Addendum)
NAME:  Kerry Barnes, MRN:  [id number], DOB:  July 12, 1933, LOS: 0 ADMISSION DATE:  01/15/2020, CONSULTATION DATE:  01/16/2020 REFERRING MD:  Roel Cluck, CHIEF COMPLAINT:  Weakness  Brief History   86 year old female with PMHx of CKD III, atrial fibrillation on Eliquis, chronic diastolic heart failure, hypertension, hyperlipidemia, and diabetes mellitus presenting for worsening weakness noted to be hypotensive despite fluid resuscitation for which PCCM consulted.   Past Medical History   Past Medical History:  Diagnosis Date  . Anxiety   . Arthritis   . Asthma   . Atypical atrial flutter ([place])   . Cataract   . Chronic diastolic CHF (congestive heart failure) ([place])   . Fibromyalgia   . GERD (gastroesophageal reflux disease)   . History of blood transfusion   . Hyperlipemia   . Hypertension   . IBS (irritable bowel syndrome)   . Seasonal allergies   . Stroke [facility]) 2013   TIA  . Tubular adenoma of colon   . Type 2 diabetes mellitus ([place])    Significant Hospital Events   8/3 > admitted  Consults:  PCCM  Procedures:  None   Significant Diagnostic Tests:  CT Abd/Pelvis w Contrast 8/2 > Consolidation of bilateral lung bases; some small bowel and scattered colonic wall thickening which may indicate enteritis; no abscess or mesenteric infilitration; change in calibre of colon at level of splenic flexure indicated possible focal lesion concerning for colon cancer  Micro Data:  8/2 COVID neg Blood cultures 8/2 > pending Urine culture 8/2 > pending MRSA negative   Antimicrobials:  Vancomycin 8/2 Ceftriaxone 8/2 Cefepime 8/2 Flagyl 8/2- 8/3 Levofloxacin 8/3 >   Interim history/subjective:  Patient admitted to ICU for persistent hypotension which is improved with fluid resuscitation. This morning, she endorses weakness is slightly improved.   Objective   Blood pressure (!) 107/55, pulse 61, temperature 98.3 F (36.8 C), temperature source Oral, resp. rate 16, height 5\' 1"   (1.549 m), weight 46.7 kg, SpO2 100 %.        Intake/Output Summary (Last 24 hours) at 01/16/2020 0940 Last data filed at 01/16/2020 0900 Gross per 24 hour  Intake 3419.47 ml  Output --  Net 3419.47 ml   Filed Weights   01/16/20 0542  Weight: 46.7 kg    Examination: General: chronically ill appearing frail elderly female, no acute distress, resting comfortably in bed HENT: normocephalic/atraumatic, PERRL, EOMI, dry mucus membranes  Lungs: decreased lung sounds in bilateral bases Cardiovascular: RRR, S1 and S2 present, no m/r/g Abdomen: soft, nondistended, nontender, normoactive bowel sounds  Extremities: warm and dry Neuro: arouses to voice; alert and oriented to self, location, and situation but reports year as 1801; appropriately answering all other questions; no cranial nerve deficits noted; moving all extremities spontaneously; strength 4/5 in bilateral upper and lower extremities, sensation to light touch grossly intact  Resolved Hospital Problem list    Assessment & Plan:  Atypical community acquired pneumonia: Noted to have bilateral lower lobe consolidations with air bronchograms on imaging with hyponatremia and elevated LFTs concerning for atypical pneumonia. Procal wnl. Patient lives at home with husband. Denies any cough, dyspnea or fevers. Suspect atypical infection . - Levaquin 500mg  q48h x 3 doses - Urine legionalla Ag pending - Mycoplasma pneumonia IgM pending  Acute toxic metabolic encephalopathy: This is improved with discontinuation of gabapentin and improvement of AKI.  - Continue to monitor   Hyponatremia: Suspect secondary to hypovolemia. Received 2L fluids + albumin.  - Trend BMP  Elevated LFTs: Suspect secondary to acute illness as this has improved from admission. Hepatitis panel negative. Iron panel with low iron levels and elevated ferritin suggestive of anemia of chronic disease vs acute inflammation  - Continue to monitor   Paroxysmal atrial  fibrillation:  Prolonged QT:  Currently rate controlled with amiodarone. QTc 438ms - Serial EKGs in setting of levaquin and amiodarone tx - Continue amiodarone  - Continue Eliquis   Chronic diastolic heart failure: Last Echo in 2017 with EF 565-70% without RWMA and wihtout significant valvular abnormalities. Does not appear to be in heart failure exacerbation at this time. - F/u Echo   Possible colonic lesion:  Noted to have change in caliber of colon at splenic flexure with focal lesion concerning for colon cancer; Patient follows closely with [facility]. Last colonoscopy in 2016 with three sessile polyps 2-28mm in size at cecum and transverse colon. Surgical pathology with tubular adenoma without high grade dysplasia or malignancy. Most recent EGD for esophageal dysphagia in 2018 without any abnormal findings.   Best practice:  Diet: Carb modified Pain/Anxiety/Delirium protocol (if indicated): n/a VAP protocol (if indicated): n/a DVT prophylaxis: Eliquis GI prophylaxis: None Glucose control: SSI Mobility: OOB as tolerated Code Status: FULL Family Communication: patient updated at bedside and husband updated via phone Disposition: Med Tele  Patient discussed with [facility], Dr. Waldron Labs. [facility] to assume patient care on 8/4.      Critical care time: 40 minutes    Harvie Heck, MD Internal Medicine, PGY-2 01/16/20 12:41 PM Pager # [phone]

## 2020-01-16 NOTE — ED Notes (Signed)
PCCM paged regarding pt's low BP

## 2020-01-16 NOTE — Progress Notes (Signed)
  Echocardiogram 2D Echocardiogram has been performed.  Kerry Barnes 01/16/2020, 11:29 AM

## 2020-01-16 NOTE — Progress Notes (Signed)
Report attempted to 5M. 

## 2020-01-16 NOTE — Evaluation (Signed)
Clinical/Bedside Swallow Evaluation Patient Details  Name: Kerry Barnes MRN: [id number] Date of Birth: [birth year]  Today's Date: 01/16/2020 Time: SLP Start Time (ACUTE ONLY): 6629 SLP Stop Time (ACUTE ONLY): 1058 SLP Time Calculation (min) (ACUTE ONLY): 10 min  Past Medical History:  Past Medical History:  Diagnosis Date  . Anxiety   . Arthritis   . Asthma   . Atypical atrial flutter ([place])   . Cataract   . Chronic diastolic CHF (congestive heart failure) ([place])   . Fibromyalgia   . GERD (gastroesophageal reflux disease)   . History of blood transfusion   . Hyperlipemia   . Hypertension   . IBS (irritable bowel syndrome)   . Seasonal allergies   . Stroke [facility]) 2013   TIA  . Tubular adenoma of colon   . Type 2 diabetes mellitus ([place])    Past Surgical History:  Past Surgical History:  Procedure Laterality Date  . ANKLE FRACTURE SURGERY    . CARDIOVERSION N/A 02/04/2016   Procedure: CARDIOVERSION;  Surgeon: Sueanne Margarita, MD;  Location: [facility] ENDOSCOPY;  Service: Cardiovascular;  Laterality: N/A;  . CARDIOVERSION N/A 05/01/2016   Procedure: CARDIOVERSION;  Surgeon: Fay Records, MD;  Location: [facility] ENDOSCOPY;  Service: Cardiovascular;  Laterality: N/A;  . CARDIOVERSION N/A 04/25/2018   Procedure: CARDIOVERSION;  Surgeon: [name], MD;  Location: [place];  Service: Cardiovascular;  Laterality: N/A;  . CHOLECYSTECTOMY    . TEE WITHOUT CARDIOVERSION N/A 02/04/2016   Procedure: TRANSESOPHAGEAL ECHOCARDIOGRAM (TEE);  Surgeon: Sueanne Margarita, MD;  Location: [facility] ENDOSCOPY;  Service: Cardiovascular;  Laterality: N/A;  . TUBAL LIGATION    . WRIST FRACTURE SURGERY     HPI:  86 yo female with PMH significant for CKD stage III, Afib on apixaban, chronic diastolic heart failure, HTN, HLD, DM type II admitted 8/2 with shock despite IVF resuscitation.  Dx include acute toxic metabolic encephalopathy, elevated LFTs, concern for colonic lesion.   Assessment / Plan /  Recommendation Clinical Impression  Pt presents with functional swallow with normal oral mechanism exam, adequate mastication of solids despite absence of teeth, brisk swallow response, no s/s of aspiration. Passed three oz water test and mixed consistencies of water/cracker with no concerns for inadequate airway protection.  Resume a regular diet to allow range of choice from menu; thin liquids; pill whole in water when able, if she coughs, give whole in applesauce.  No SLP f/u is needed.    SLP Visit Diagnosis: Dysphagia, unspecified (R13.10)    Aspiration Risk  No limitations    Diet Recommendation   regular solids, thin liquids  Medication Administration: Whole meds with liquid    Other  Recommendations Oral Care Recommendations: Oral care BID   Follow up Recommendations None      Frequency and Duration            Prognosis        Swallow Study   General HPI: 86 yo female with PMH significant for CKD stage III, Afib on apixaban, chronic diastolic heart failure, HTN, HLD, DM type II admitted 8/2 with shock despite IVF resuscitation.  Dx include acute toxic metabolic encephalopathy, elevated LFTs, concern for colonic lesion. Type of Study: Bedside Swallow Evaluation Previous Swallow Assessment: no Diet Prior to this Study: NPO Temperature Spikes Noted: No Respiratory Status: Room air History of Recent Intubation: No Behavior/Cognition: Alert;Cooperative Oral Cavity Assessment: Within Functional Limits Oral Care Completed by SLP: Recent completion by staff Oral Cavity - Dentition: Edentulous Vision: Functional  for self-feeding Self-Feeding Abilities: Able to feed self Patient Positioning: Upright in bed Baseline Vocal Quality: Normal Volitional Cough: Strong Volitional Swallow: Able to elicit    Oral/Motor/Sensory Function Overall Oral Motor/Sensory Function: Within functional limits   Ice Chips Ice chips: Within functional limits   Thin Liquid Thin Liquid: Within  functional limits    Nectar Thick Nectar Thick Liquid: Not tested   Honey Thick Honey Thick Liquid: Not tested   Puree Puree: Within functional limits   Solid     Solid: Within functional limits      Juan Quam Laurice 01/16/2020,11:05 AM   Estill Bamberg L. Tivis Ringer, [place] Office number [phone] Pager [phone]

## 2020-01-16 NOTE — Consult Note (Signed)
WOC Nurse Consult Note: Reason for Consult:Unstageable pressure injury to sacrum, present on admission. Recent decline in mobility and loose stools. Wound type:pressure and incontinence.  Pressure Injury POA: Yes Measurement: 4 cm x 3.2 cm slough to woundbed.  Wound TGY:BWLSLH and gray slough to wound bed Drainage (amount, consistency, odor) none noted.  Periwound:erythema present circumferentially Dressing procedure/placement/frequency:Cleanse sacral wound with NS and pat dry.  Apply Santyl to wound bed.  Cover with NS moist gauze and top with foam dressing.  Peel back foam and change dressing daily and replace foam dressing every three days.  Will not follow at this time.  Please re-consult if needed.  Domenic Moras MSN, RN, FNP-BC CWON Wound, Ostomy, Continence Nurse Pager [phone]

## 2020-01-16 NOTE — Evaluation (Signed)
Occupational Therapy Evaluation Patient Details Name: JOEANN STEPPE MRN: [id number] DOB: 01/24/1934 Today's Date: 01/16/2020    History of Present Illness 86 yo female with PMH significant for CKD stage III, Afib on apixaban, chronic diastolic heart failure, HTN, HLD, DM type II admitted 8/2 with shock despite IVF resuscitation.  Dx include acute toxic metabolic encephalopathy, elevated LFTs, concern for colonic lesion.    Clinical Impression   PTA, pt was living at home with her husband, pt reports her daughter assisted her with shower transfers but otherwise pt was independent with ADL/IADL and modified independent with functional mobility. Unsure reliability of PLOF due to cognitive limitations (see cognition section). Pt with flat affect throughout the session and with minimal verbal engagement. Pt currently requires maxA to progress to EOB, she requires modA to stand and for stand-pivot transfer to recliner. Due to generalized weakness, instability and decreased activity tolerance, and cognitive limitations pt will benefit from continued OT services to maximize safety and independence with self-care and functional mobility to allow safe d/c to venue listed below. At this time, recommend SNF follow-up. Will continue to follow acutely.     Follow Up Recommendations  SNF;Supervision/Assistance - 24 hour    Equipment Recommendations  3 in 1 bedside commode    Recommendations for Other Services       Precautions / Restrictions Precautions Precautions: Fall Restrictions Weight Bearing Restrictions: No      Mobility Bed Mobility Overal bed mobility: Needs Assistance Bed Mobility: Supine to Sit     Supine to sit: Max assist     General bed mobility comments: maxA to progress BLE and progress trunk to upright position   Transfers Overall transfer level: Needs assistance Equipment used:  (1 person face to face transfer) Transfers: Sit to/from [facility] Sit  to Stand: Mod assist Stand pivot transfers: Mod assist       General transfer comment: modA to powerup into standing with face to face transfer, modA to pivot to recliner    Balance Overall balance assessment: Needs assistance Sitting-balance support: Bilateral upper extremity supported;Feet supported Sitting balance-Leahy Scale: Poor Sitting balance - Comments: unable to tolerate challenge   Standing balance support: Bilateral upper extremity supported;During functional activity Standing balance-Leahy Scale: Poor Standing balance comment: reliant on BUE support                           ADL either performed or assessed with clinical judgement   ADL Overall ADL's : Needs assistance/impaired Eating/Feeding: Set up;Sitting   Grooming: Min guard;Sitting   Upper Body Bathing: Minimal assistance;Sitting   Lower Body Bathing: Moderate assistance;Sit to/from stand   Upper Body Dressing : Minimal assistance;Sitting   Lower Body Dressing: Moderate assistance;Sit to/from stand Lower Body Dressing Details (indicate cue type and reason): assist to don socks Toilet Transfer: Moderate assistance;Stand-pivot Toilet Transfer Details (indicate cue type and reason): stand-pivot to recliner from EOB  Toileting- Clothing Manipulation and Hygiene: Moderate assistance;Sit to/from stand       Functional mobility during ADLs: Moderate assistance General ADL Comments: pt with decreased activity tolerance, limited by cognition     Vision Baseline Vision/History: Wears glasses Wears Glasses: Reading only Patient Visual Report: No change from baseline       Perception     Praxis      Pertinent Vitals/Pain Pain Assessment: No/denies pain     Hand Dominance Right   Extremity/Trunk Assessment Upper Extremity Assessment Upper Extremity Assessment: Generalized  weakness (grossly 3-/5)   Lower Extremity Assessment Lower Extremity Assessment: Defer to PT evaluation   Cervical  / Trunk Assessment Cervical / Trunk Assessment: Kyphotic   Communication Communication Communication: No difficulties   Cognition Arousal/Alertness: Awake/alert Behavior During Therapy: Flat affect Overall Cognitive Status: No family/caregiver present to determine baseline cognitive functioning Area of Impairment: Orientation;Attention;Following commands;Safety/judgement                 Orientation Level: Disoriented to;Place;Time;Situation Current Attention Level: Focused   Following Commands: Follows one step commands with increased time;Follows one step commands consistently Safety/Judgement: Decreased awareness of safety;Decreased awareness of deficits     General Comments: pt following commands consistently, slow to respond, pt oriented to place and self only, stated month was march;   General Comments  pt with brief moment of non-sustained VT at end of session, RN notified;SpO2 >96% RA throughout;HR 80s-90s    Exercises     Shoulder Instructions      Home Living Family/patient expects to be discharged to:: Private residence Living Arrangements: Spouse/significant other Available Help at Discharge: Family;Available PRN/intermittently Type of Home: House Home Access: Level entry     Home Layout: One level     Bathroom Shower/Tub: Occupational psychologist: Handicapped height Bathroom Accessibility: Yes   Home Equipment: Grab bars - tub/shower;Walker - 2 wheels;Cane - single point          Prior Functioning/Environment Level of Independence: Needs assistance  Gait / Transfers Assistance Needed: uses cane for mobiltiy ADL's / Homemaking Assistance Needed: daughter assists pt in and out of shower;otherwise pt independent;independent with medication management            OT Problem List: Decreased strength;Decreased range of motion;Decreased activity tolerance;Impaired balance (sitting and/or standing);Decreased cognition;Decreased safety  awareness;Decreased knowledge of use of DME or AE;Cardiopulmonary status limiting activity      OT Treatment/Interventions: Self-care/ADL training;Therapeutic exercise;Energy conservation;DME and/or AE instruction;Therapeutic activities;Cognitive remediation/compensation;Patient/family education;Balance training    OT Goals(Current goals can be found in the care plan section) Acute Rehab OT Goals Patient Stated Goal: pt did not state OT Goal Formulation: With patient Time For Goal Achievement: 01/30/20 Potential to Achieve Goals: Good ADL Goals Pt Will Perform Grooming: standing;with set-up Pt Will Perform Lower Body Dressing: with set-up;sit to/from stand Pt Will Transfer to Toilet: with supervision;ambulating Additional ADL Goal #1: Pt will complete multistep cognition task with <3 errors for safe engagement in ADL/IADL and functional mobiltiy.  OT Frequency: Min 2X/week   Barriers to D/C: Decreased caregiver support  pt reports husband is not able to provide 24/7 assist       Co-evaluation              AM-PAC OT "6 Clicks" Daily Activity     Outcome Measure Help from another person eating meals?: A Little Help from another person taking care of personal grooming?: A Little Help from another person toileting, which includes using toliet, bedpan, or urinal?: A Lot Help from another person bathing (including washing, rinsing, drying)?: A Lot Help from another person to put on and taking off regular upper body clothing?: A Little Help from another person to put on and taking off regular lower body clothing?: A Lot 6 Click Score: 15   End of Session Equipment Utilized During Treatment: Gait belt Nurse Communication: Mobility status (non-sustained VT episode)  Activity Tolerance: Patient tolerated treatment well Patient left: in chair;with call bell/phone within reach;with chair alarm set  OT Visit Diagnosis: Unsteadiness on  feet (R26.81);Other abnormalities of gait and  mobility (R26.89);Muscle weakness (generalized) (M62.81)                Time: 3958-4417 OT Time Calculation (min): 34 min Charges:  OT General Charges $OT Visit: 1 Visit OT Evaluation $OT Eval Moderate Complexity: 1 Mod OT Treatments $Self Care/Home Management : 8-22 mins  Helene Kelp OTR/L Acute Rehabilitation Services Office: [phone]   Wyn Forster 01/16/2020, 3:55 PM

## 2020-01-17 ENCOUNTER — Inpatient Hospital Stay (HOSPITAL_COMMUNITY): Payer: HMO

## 2020-01-17 DIAGNOSIS — D649 Anemia, unspecified: Secondary | ICD-10-CM

## 2020-01-17 DIAGNOSIS — R933 Abnormal findings on diagnostic imaging of other parts of digestive tract: Secondary | ICD-10-CM

## 2020-01-17 DIAGNOSIS — R945 Abnormal results of liver function studies: Secondary | ICD-10-CM

## 2020-01-17 LAB — COMPREHENSIVE METABOLIC PANEL
ALT: 55 U/L — ABNORMAL HIGH (ref 0–44)
AST: 73 U/L — ABNORMAL HIGH (ref 15–41)
Albumin: 1.4 g/dL — ABNORMAL LOW (ref 3.5–5.0)
Alkaline Phosphatase: 73 U/L (ref 38–126)
Anion gap: 8 (ref 5–15)
BUN: 11 mg/dL (ref 8–23)
CO2: 19 mmol/L — ABNORMAL LOW (ref 22–32)
Calcium: 7.2 mg/dL — ABNORMAL LOW (ref 8.9–10.3)
Chloride: 108 mmol/L (ref 98–111)
Creatinine, Ser: 0.82 mg/dL (ref 0.44–1.00)
GFR calc Af Amer: >60 mL/min (ref >60)
GFR calc non Af Amer: >60 mL/min (ref >60)
Glucose, Bld: 86 mg/dL (ref 70–99)
Potassium: 3.6 mmol/L (ref 3.5–5.1)
Sodium: 135 mmol/L (ref 135–145)
Total Bilirubin: 1.8 mg/dL — ABNORMAL HIGH (ref 0.3–1.2)
Total Protein: 4.7 g/dL — ABNORMAL LOW (ref 6.5–8.1)

## 2020-01-17 LAB — CBC
HCT: 24 % — ABNORMAL LOW (ref 36.0–46.0)
Hemoglobin: 7.8 g/dL — ABNORMAL LOW (ref 12.0–15.0)
MCH: 25.7 pg — ABNORMAL LOW (ref 26.0–34.0)
MCHC: 32.5 g/dL (ref 30.0–36.0)
MCV: 79.2 fL — ABNORMAL LOW (ref 80.0–100.0)
Platelets: 250 10*3/uL (ref 150–400)
RBC: 3.03 MIL/uL — ABNORMAL LOW (ref 3.87–5.11)
RDW: 19.6 % — ABNORMAL HIGH (ref 11.5–15.5)
WBC: 18.4 10*3/uL — ABNORMAL HIGH (ref 4.0–10.5)
nRBC: 0 % (ref 0.0–0.2)

## 2020-01-17 LAB — MYCOPLASMA PNEUMONIAE ANTIBODY, IGM: Mycoplasma pneumo IgM: <770 U/mL (ref 0–769)

## 2020-01-17 LAB — GLUCOSE, CAPILLARY
Glucose-Capillary: 105 mg/dL — ABNORMAL HIGH (ref 70–99)
Glucose-Capillary: 76 mg/dL (ref 70–99)
Glucose-Capillary: 87 mg/dL (ref 70–99)
Glucose-Capillary: 87 mg/dL (ref 70–99)
Glucose-Capillary: 87 mg/dL (ref 70–99)
Glucose-Capillary: 90 mg/dL (ref 70–99)
Glucose-Capillary: 94 mg/dL (ref 70–99)

## 2020-01-17 LAB — STREP PNEUMONIAE URINARY ANTIGEN: Strep Pneumo Urinary Antigen: NEGATIVE

## 2020-01-17 LAB — LACTATE DEHYDROGENASE: LDH: 272 U/L — ABNORMAL HIGH (ref 98–192)

## 2020-01-17 MED ORDER — ADULT MULTIVITAMIN W/MINERALS CH
1.0000 | ORAL_TABLET | Freq: Every day | ORAL | Status: DC
  Administered 2020-01-18 – 2020-01-22 (×5): 1 via ORAL
  Filled 2020-01-17 (×5): qty 1

## 2020-01-17 MED ORDER — ENSURE ENLIVE PO LIQD
237.0000 mL | Freq: Two times a day (BID) | ORAL | Status: DC
  Administered 2020-01-18: 237 mL via ORAL

## 2020-01-17 MED ORDER — SODIUM CHLORIDE 0.9 % IV SOLN
510.0000 mg | Freq: Once | INTRAVENOUS | Status: AC
  Administered 2020-01-17: 510 mg via INTRAVENOUS
  Filled 2020-01-17: qty 17

## 2020-01-17 MED ORDER — BISACODYL 5 MG PO TBEC
20.0000 mg | DELAYED_RELEASE_TABLET | Freq: Once | ORAL | Status: AC
  Administered 2020-01-18: 20 mg via ORAL
  Filled 2020-01-17: qty 4

## 2020-01-17 MED ORDER — MIDODRINE HCL 5 MG PO TABS: 5.0000 mg | ORAL_TABLET | Freq: Two times a day (BID) | ORAL | Status: DC

## 2020-01-17 MED ORDER — MIDODRINE HCL 5 MG PO TABS
5.0000 mg | ORAL_TABLET | Freq: Two times a day (BID) | ORAL | Status: DC
  Administered 2020-01-17: 5 mg via ORAL
  Filled 2020-01-17: qty 1

## 2020-01-17 NOTE — H&P (View-Only) (Signed)
                                                                           [facility] Consult: 12:51 PM 01/17/2020  LOS: 1 day    Referring Provider: Dr Preetha Joseph  Primary Care Physician:  Kaplan, Kristen W., PA-C Primary Gastroenterologist:  Dr Pyrtle    Reason for Consultation:  Evaluate for ? lesion at splenic flexure.     HPI: Kerry Barnes is a 85 y.o. female.  PMH A. fib, on Eliquis.  IBS/chronic constipation, on Linzess in past. GERD.  Dysphagia due to esophageal dysmotility previous positive responses to empiric  esophageal dilatation.  Colon polyps.  CKD 3.  Fibromyalgia.  TIA.  DM2, not on insulin.  Multiple other medical issues outlined below.  10/2013 EGD.  For recurrent solid dysphagia.  Esophagus, stomach, examined duodenum normal.  Empiric balloon dilation to 18 mm in lower third of esophagus across GE junction.  01/2015 colonoscopy.  Follow-up of positive Cologuard, normal colonoscopy 10 years prior.  Three, 2 to 4 mm sized, sessile polyps at cecum and transverse colon. Path:  1 lymphoid aggregate, 2 TAs with no HGD etc.  No recall colonoscopy due to advanced pt age.   07/2016 Ba esophagram: Mild to moderate esophageal dysmotility.  Small HH.  Mild GE reflux.  No esophageal stricture 03/16/2017 EGD.  For recurrent dysphagia.  Normal esophagus, stomach, examined duodenum..  Due to prior positive response to esophageal dilatation, Dr. Pyrtle elected to perform balloon dilation to 18 mm in lower esophagus/GE junction.  Presented to ED 7/30 w acute on long-term chronic bil leg pain.  PMD has diagnosed this as peripheral neuropathy.  Unilateral edema on exam, LE dopplers unremarkable.  Discharged home, advised to take tylenol prn.   Returned to ED 8/2 and then admitted w progressive weakness present for months.  Mostly sedentary, not able to ambulate and developed ulcer on  buttocks.  Loose stools, no blood. Pt says stools loose x 3 years, 2 x daily, color brown to yellow.   No abd or rectal pain.  Pt denies fecal incontinence.  + Anorexia, decreased po intake.  Denies dysphagia, choking w po.  No n/v, fever or chills.   BMI is 19.     BPs as low as 89/46 with ongoing soft blood pressures.  No tachycardia, no fever. Na 164 >> 135.  Renal fx preserved.   Hgb 7.7, MCV 79 WBCs 17.3 >> 18.4.  Platelets normal.   INR 1.5.  T bili 1.8.  Alk phos 73.  AST/ALT 73/55.     CTAP w contrast: bil basilar lung consolidation.  Bowel decompressed which limits evaluation but some suggestion of SB and scattered colonic wall thickening possibly indicating enteritis.  Change in caliber at: At level of splenic flexure might indicate focal lesion.  Aortic atherosclerosis.  S/p cholecystectomy.  No liver, pancreatic, splenic, biliary tree abnormalities. SLP BS swallow eval: normal.  Cleared for solid food, thin liquids.         Past Medical History:  Diagnosis Date  . Anxiety   . Arthritis   . Asthma   . Atypical atrial flutter (HCC)   . Cataract   . Chronic   diastolic CHF (congestive heart failure) ([place])   . Fibromyalgia   . GERD (gastroesophageal reflux disease)   . History of blood transfusion   . Hyperlipemia   . Hypertension   . IBS (irritable bowel syndrome)   . Seasonal allergies   . Stroke [facility]) 2013   TIA  . Tubular adenoma of colon   . Type 2 diabetes mellitus ([place])     Past Surgical History:  Procedure Laterality Date  . ANKLE FRACTURE SURGERY    . CARDIOVERSION N/A 02/04/2016   Procedure: CARDIOVERSION;  Surgeon: Sueanne Margarita, MD;  Location: [facility] ENDOSCOPY;  Service: Cardiovascular;  Laterality: N/A;  . CARDIOVERSION N/A 05/01/2016   Procedure: CARDIOVERSION;  Surgeon: Fay Records, MD;  Location: [facility] ENDOSCOPY;  Service: Cardiovascular;  Laterality: N/A;  . CARDIOVERSION N/A 04/25/2018   Procedure: CARDIOVERSION;  Surgeon: [name], MD;  Location: [place];  Service: Cardiovascular;  Laterality: N/A;  . CHOLECYSTECTOMY    . TEE WITHOUT CARDIOVERSION N/A 02/04/2016   Procedure: TRANSESOPHAGEAL ECHOCARDIOGRAM (TEE);  Surgeon: Sueanne Margarita, MD;  Location: [facility] ENDOSCOPY;  Service: Cardiovascular;  Laterality: N/A;  . TUBAL LIGATION    . WRIST FRACTURE SURGERY      Prior to Admission medications   Medication Sig Start Date End Date Taking? Authorizing Provider  acetaminophen (TYLENOL) 325 MG tablet Take 650 mg by mouth every 6 (six) hours as needed for moderate pain.    Yes [provider]  albuterol (PROVENTIL HFA;VENTOLIN HFA) 108 (90 BASE) MCG/ACT inhaler Inhale 2 puffs into the lungs every 6 (six) hours as needed for wheezing or shortness of breath.    Yes [provider]  amiodarone (PACERONE) 200 MG tablet TAKE 1 TABLET BY MOUTH TWICE DAILY Patient taking differently: Take 200 mg by mouth 2 (two) times daily.  08/15/19  Yes Patwardhan, Manish J, MD  amLODipine (NORVASC) 5 MG tablet Take 5 mg by mouth daily.  10/17/18  Yes [provider]  atorvastatin (LIPITOR) 40 MG tablet Take 40 mg by mouth daily.   Yes [provider]  ELIQUIS 2.5 MG TABS tablet TAKE 1 TABLET(2.5 MG) BY MOUTH TWICE DAILY Patient taking differently: Take 2.5 mg by mouth 2 (two) times daily.  01/10/20  Yes Patwardhan, Manish J, MD  EPINEPHrine (EPI-PEN) 0.3 mg/0.3 mL SOAJ injection Inject 0.3 mg once as needed into the muscle (severe allergic reaction).    Yes [provider]  gabapentin (NEURONTIN) 100 MG capsule Take 1 capsule (100 mg total) by mouth 3 (three) times daily. 01/12/20  Yes Deno Etienne, DO  ibandronate (BONIVA) 150 MG tablet Take 150 mg by mouth every 30 (thirty) days. 10/25/19  Yes [provider]  mometasone (NASONEX) 50 MCG/ACT nasal spray Place 2 sprays into the nose daily as needed (nasal congestion).    Yes [provider]  Propylene Glycol (SYSTANE BALANCE OP) Place 1 drop daily as needed  into both eyes (dryness).    Yes [provider]  solifenacin (VESICARE) 5 MG tablet Take 5 mg by mouth daily. 09/02/17  Yes [provider]  trimethoprim-polymyxin b (POLYTRIM) ophthalmic solution Place 1 drop into the left eye 4 (four) times daily. 02/03/18  Yes [provider]  LINZESS 290 MCG CAPS capsule TAKE 1 CAPSULE BY MOUTH DAILY BEFORE BREAKFAST Patient not taking: Reported on 01/16/2020 07/27/19   Pyrtle, Lajuan Lines, MD  methocarbamol (ROBAXIN) 500 MG tablet Take 1 tablet (500 mg total) every 6 (six) hours as needed by mouth  for muscle spasms. Patient not taking: Reported on 01/16/2020 04/27/17   Vann, Jessica U, DO    Scheduled Meds: . amiodarone  200 mg Oral BID  . atorvastatin  40 mg Oral Daily  . collagenase   Topical Daily  . docusate sodium  100 mg Oral BID  . insulin aspart  0-9 Units Subcutaneous Q4H  . mouth rinse  15 mL Mouth Rinse BID  . midodrine  5 mg Oral BID WC  . sodium chloride flush  3 mL Intravenous Q12H   Infusions: . levofloxacin (LEVAQUIN) IV 500 mg (01/16/20 1213)   PRN Meds: albuterol, docusate sodium, ondansetron **OR** ondansetron (ZOFRAN) IV, polyethylene glycol   Allergies as of 01/15/2020 - Review Complete 01/15/2020  Allergen Reaction Noted  . Codeine Nausea Only 10/10/2013  . Metformin Diarrhea 07/12/2015  . Amoxicillin Rash 11/01/2006  . Biaxin [clarithromycin] Rash 10/10/2013  . Ciprofloxacin Rash 04/04/2007  . Penicillins Rash 04/04/2007    Family History  Problem Relation Age of Onset  . Diabetes Mother   . Hypertension Mother   . Diabetes Father   . Diabetes Sister   . Diabetes Brother   . Hypertension Brother   . Colon cancer Neg Hx   . Stomach cancer Neg Hx     Social History   Socioeconomic History  . Marital status: Married    Spouse name: Not on file  . Number of children: 8  . Years of education: Not on file  . Highest education level: Not on file  Occupational History  . Occupation: Housewife     Employer: RETIRED  Tobacco Use  . Smoking status: Never Smoker  . Smokeless tobacco: Never Used  Vaping Use  . Vaping Use: Never used  Substance and Sexual Activity  . Alcohol use: No    Alcohol/week: 0.0 standard drinks  . Drug use: No  . Sexual activity: Never    Partners: Male  Other Topics Concern  . Not on file  Social History Narrative   Grandchildren 14      Great Grandchildren 13   Social Determinants of Health   Financial Resource Strain:   . Difficulty of Paying Living Expenses:   Food Insecurity:   . Worried About Running Out of Food in the Last Year:   . Ran Out of Food in the Last Year:   Transportation Needs:   . Lack of Transportation (Medical):   . Lack of Transportation (Non-Medical):   Physical Activity:   . Days of Exercise per Week:   . Minutes of Exercise per Session:   Stress:   . Feeling of Stress :   Social Connections:   . Frequency of Communication with Friends and Family:   . Frequency of Social Gatherings with Friends and Family:   . Attends Religious Services:   . Active Member of Clubs or Organizations:   . Attends Club or Organization Meetings:   . Marital Status:   Intimate Partner Violence:   . Fear of Current or Ex-Partner:   . Emotionally Abused:   . Physically Abused:   . Sexually Abused:     REVIEW OF SYSTEMS: Constitutional: Weakness, fatigue ENT:  No nose bleeds Pulm: No shortness of breath.  No cough. CV:  No palpitations, no LE edema.  GU:  No hematuria, no frequency GI: See HPI. Heme: Denies unusual bleeding or bruising. Transfusions: None. Neuro: Pain in lower extremities.  No headaches, no dizziness, no syncope, no seizures. Derm:  No itching, no rash or sores.    Endocrine:  No sweats or chills.  No polyuria or dysuria Immunization: Not queried Travel:  None beyond local counties in last few months.    PHYSICAL EXAM: Vital signs in last 24 hours: Vitals:   01/17/20 0436 01/17/20 1027  BP: (!) 93/48 (!)  110/49  Pulse: (!) 58 62  Resp: 18 18  Temp: 97.8 F (36.6 C) 98.8 F (37.1 C)  SpO2: 95% 98%   Wt Readings from Last 3 Encounters:  01/16/20 46.7 kg  03/27/19 60.3 kg  08/29/18 64.5 kg   General: Frail, aged, looks unwell but not critically ill Head: No facial asymmetry or swelling.  No signs of head trauma Eyes: No scleral icterus.  No conjunctival pallor. Ears: Not hard of hearing Nose: No congestion or discharge. Mouth: Tongue midline.  Mucosa slightly dry but clear. Neck: No JVD, no masses, no thyromegaly Lungs: Diminished sounds in bases but clear.  No labored breathing, no cough Heart: RRR.  NSR in 60s on telemetry monitor.  S1, S2 audible.  No MRG. Abdomen: Soft, nontender.  Bowel sounds normal quality but hypoactive.  Fullness in mid lower abdomen/pelvis without discrete mass.  No HSM, bruits, hernias.   Rectal: Deferred Musc/Skeltl: No joint redness, swelling or gross deformity Extremities: No CCE. Neurologic: Oriented to self, her birthdate, [place].  Could not tell me what year it was or name the [place] president Skin: No obvious rash or suspicious lesions. Tattoos: None Nodes: No cervical adenopathy Psych: Flat affect, cooperative, paucity of speech.  Intake/Output from previous day: 08/03 0701 - 08/04 0700 In: 695.3 [P.O.:60; I.V.:316.9; IV Piggyback:318.4] Out: 0  Intake/Output this shift: No intake/output data recorded.  LAB RESULTS: Recent Labs    01/15/20 1901 01/16/20 0517 01/17/20 0456  WBC 20.3* 17.3* 18.4*  HGB 10.5* 7.7* 7.8*  HCT 32.2* 24.1* 24.0*  PLT 334 268 250   BMET Lab Results  Component Value Date   NA 135 01/17/2020   NA 133 (L) 01/16/2020   NA 164 (HH) 01/16/2020   K 3.6 01/17/2020   K 3.5 01/16/2020   K 3.2 (L) 01/16/2020   CL 108 01/17/2020   CL 106 01/16/2020   CL 81 (L) 01/16/2020   CO2 19 (L) 01/17/2020   CO2 19 (L) 01/16/2020   CO2 12 (L) 01/16/2020   GLUCOSE 86 01/17/2020   GLUCOSE 91 01/16/2020   GLUCOSE 72  01/16/2020   BUN 11 01/17/2020   BUN 14 01/16/2020   BUN 12 01/16/2020   CREATININE 0.82 01/17/2020   CREATININE 0.83 01/16/2020   CREATININE 0.67 01/16/2020   CALCIUM 7.2 (L) 01/17/2020   CALCIUM 7.3 (L) 01/16/2020   CALCIUM <4.0 (LL) 01/16/2020   LFT Recent Labs    01/15/20 1901 01/16/20 0946 01/17/20 0456  PROT 7.0 4.5* 4.7*  ALBUMIN 1.9* 1.4* 1.4*  AST 125* 68* 73*  ALT 92* 53* 55*  ALKPHOS 101 60 73  BILITOT 2.5* 1.5* 1.8*   PT/INR Lab Results  Component Value Date   INR 1.5 (H) 01/16/2020   INR 1.1 04/29/2016   INR 1.01 01/31/2016   Hepatitis Panel Recent Labs    01/15/20 2337  HEPBSAG NON REACTIVE  HCVAB NON REACTIVE  HEPAIGM NON REACTIVE  HEPBIGM NON REACTIVE   Lipase     Component Value Date/Time   LIPASE 90 (H) 04/23/2018 0629      RADIOLOGY STUDIES: DG Chest 2 View  Result Date: 01/15/2020 CLINICAL DATA:  Ongoing weakness. EXAM: CHEST - 2 VIEW COMPARISON:  April 23, 2018 FINDINGS: Mild areas of atelectasis and/or infiltrate are seen within the bilateral lung bases, left greater than right. Very small bilateral pleural effusions are seen. No pneumothorax is identified. The cardiac silhouette is mildly enlarged. Mild calcification of the aortic arch is noted. Multilevel degenerative changes seen throughout the thoracic spine. Radiopaque surgical clips are seen within the right upper quadrant. IMPRESSION: 1. Mild bibasilar atelectasis and/or infiltrate, left greater than right. 2. Very small bilateral pleural effusions. Electronically Signed   By: Virgina Norfolk [name]   On: 01/15/2020 18:58   CT ABDOMEN PELVIS W CONTRAST  Result Date: 01/15/2020 CLINICAL DATA:  Unintended weight loss. Elevated liver function studies. Abnormal white cell count and lactic. EXAM: CT ABDOMEN AND PELVIS WITH CONTRAST TECHNIQUE: Multidetector CT imaging of the abdomen and pelvis was performed using the standard protocol following bolus administration of intravenous contrast.  CONTRAST:  26m OMNIPAQUE IOHEXOL 300 MG/ML  SOLN COMPARISON:  04/24/2018 FINDINGS: Lower chest: Consolidation in both lung bases.  No effusions. Hepatobiliary: No focal liver abnormality is seen. Status post cholecystectomy. No biliary dilatation. Pancreas: Unremarkable. No pancreatic ductal dilatation or surrounding inflammatory changes. Spleen: Normal in size without focal abnormality. Adrenals/Urinary Tract: Adrenal glands are unremarkable. Kidneys are normal, without renal calculi, focal lesion, or hydronephrosis. Bladder is unremarkable. Stomach/Bowel: Stomach is decompressed. Small bowel are decompressed. Scattered gas and stool in the colon with residual contrast material mostly in the descending and sigmoid colon. No colonic dilatation. Decompression of bowel limits evaluation of the bowel wall but there is suggestion of some small bowel and scattered colonic wall thickening. This may indicate enteritis. No abscess or mesenteric infiltration is identified. Although not overtly distended, there is a change in caliber of the colon at the level of the splenic flexure possibly indicating a focal lesion. Consider endoscopy if clinically indicated to exclude colon cancer. The appendix is normal. Vascular/Lymphatic: Prominent aortic calcification. No aneurysm. No significant lymphadenopathy. Reproductive: Uterus and bilateral adnexa are unremarkable. Other: No abdominal wall hernia or abnormality. No abdominopelvic ascites. Musculoskeletal: Degenerative changes in the spine. Lumbar scoliosis convex towards the right. No destructive bone lesions. IMPRESSION: 1. Consolidation in both lung bases. 2. Decompression of bowel limits evaluation of the bowel wall but there is suggestion of some small bowel and scattered colonic wall thickening. This may indicate enteritis. No abscess or mesenteric infiltration is identified. 3. Although not overtly distended, there is a change in caliber of the colon at the level of the  splenic flexure possibly indicating a focal lesion. Consider endoscopy if clinically indicated to exclude colon cancer. 4. Aortic atherosclerosis. Aortic Atherosclerosis (ICD10-I70.0). Electronically Signed   By: WLucienne CapersM.D.   On: 01/15/2020 23:46   ECHOCARDIOGRAM COMPLETE  Result Date: 01/16/2020 IMPRESSIONS  1. Left ventricular ejection fraction, by estimation, is 60 to 65%. The left ventricle has normal function. The left ventricle has no regional wall motion abnormalities. Left ventricular diastolic function could not be evaluated.  2. Right ventricular systolic function is normal. The right ventricular size is normal. There is mildly elevated pulmonary artery systolic pressure. The estimated right ventricular systolic pressure is 419.5mmHg.  3. Left atrial size was moderately dilated.  4. The mitral valve is rheumatic. Mild to moderate mitral valve regurgitation. Mild mitral stenosis. The mean mitral valve gradient is 3.0 mmHg with average heart rate of 62 bpm.  5. The aortic valve is normal in structure. Aortic valve regurgitation is not visualized. No aortic stenosis is present.  6. The inferior vena  cava is normal in size with greater than 50% respiratory variability, suggesting right atrial pressure of 3 mmHg. Comparison(s): Prior images unable to be directly viewed, comparison made by report only. Electronically signed by Mihai Croitoru MD Signature Date/Time: 01/16/2020/12:52:12 PM     IMPRESSION:   *   FTT, unintentional wt loss, anorexia, loose stools.  Previous hx IBS-C.   CT suggests thickening of SB and colon, ? Enteritis.  Also change in conic caliber at splenic flexure.   01/2015 Colonoscopy w TA (#2) and lymphoid aggregate (#1) polyps.  No surveillance colonoscopy since then due to pt age.  hypoalbuminema may be cause of thickening seen on CT    *   Elevated LFTs: T bili and transaminases.  GB out.  Liver, biliary tree, pancreas normal on CT.  ? elemant of hepatic  hypoperfusion in setting of hypotension?  Low albumin, suspect PCM.   *   Chronic eliquis for A fib.  On hold.  Unknown last dose but > 24 yrs ago.  *   Microcytic anemia.  Hgb 11.5 >> 7.8 in last 5 days, suspect some of this is dilutional as pt received IV boluses in ED.  Hgb was 12 to 13 in 2019.  Iron low at 16, TIBC/Sat not calculated.  Ferritin 2258.  Folate and B12 wnl.     *   Leukocytosis w/o fever.  CXR s/o pneumonia.  Levaquin d 2.    *   Hx dysphagia and Esophageal dysmotility.  No esophageal strictures but + response to empiric esoph dilatations in past.  Pt currently denies sxs s/o dysphagia.         PLAN:     *   Pre-albumin in AM.     *   ? Colonoscopy?  Dr Balinda Heacock will see pt and decide.     *   Dose IV Feraheme.     Sarah Gribbin  01/17/2020, 12:51 PM Phone [phone]  ________________________________________________________________________  [facility] MD note:  I personally examined the patient, reviewed the data and agree with the assessment and plan described above.  The CT scan is pretty impressive with a distinct transition to normal diameter colon around the splenic flexure and proximally dilated colon.  I think colonoscopy is the best next step.  We will work towards Friday for that. I am going to start clears now.   Addis Bennie, MD [facility] Pager [phone]    

## 2020-01-17 NOTE — Progress Notes (Signed)
PROGRESS NOTE    Kerry Barnes  WJX:[id number] DOB: 14-Jul-1933 DOA: 01/15/2020 PCP: Aletha Halim., PA-C  Brief Narrative: 86 year old female with PMHx of CKD III, atrial fibrillation on Eliquis, chronic diastolic heart failure, hypertension, hyperlipidemia, and diabetes mellitus presenting for worsening weakness, FTT noted to be hypotensive in the ED, admitted to ICU treated with IV fluids, blood pressure stabilized, CT abdomen pelvis noted lower lobe consolidation and changing caliber of the colon suggesting possible focal lesion -Transferred to [facility] service 8/4  Assessment & Plan:    Multifocal pneumonia -CT noted bilateral lower lobe consolidation -Currently on Levaquin -Follow-up blood cultures -Also check SLP eval to rule out aspiration -Follow-up urine Legionella antigen and strep pneumo antigen  Metabolic encephalopathy -In the setting of pneumonia, gabapentin -Mental status is improved, gabapentin discontinued  Weight loss, failure to thrive, severe hypoalbuminemia -Rule out malignancy, cirrhosis, has mild coagulopathy as well which might indicate cirrhosis -GI consult for colonoscopy -Right upper quadrant ultrasound with elastography -Ferritin is significantly elevated raising consideration for hemochromatosis, anemia workup as below  Iron deficiency anemia -Anemia panel with iron deficiency and significantly elevated ferritin -GI consulted, given IV iron x1 -Colonoscopy to be considered, spouse would prefer this inpatient due to logistical challenges of her ongoing decline and trying to get this done as outpatient -check peripheral smear, hb electrophoresis, ldh  Hypotension -Mild, may be chronic,?  Hypovolemia, maps greater than 60 -Clinically do not suspect sepsis -Follow-up blood cultures -Add low-dose midodrine, evaluation for cirrhosis  Paroxysmal atrial fibrillation -In sinus rhythm at this time, Eliquis on hold -Continue amiodarone   Chronic  diastolic CHF -Echo notes diastolic dysfunction, preserved EF and moderate mitral regurgitation -Clinically euvolemic, monitor  Type 2 diabetes mellitus -Januvia on hold, hba1c is 5.1 -Sliding scale insulin for now  Sacral decubitus ulcer -Wound care consult  DVT prophylaxis: Eliquis on hold Code Status: Full code Family Communication: Spouse at bedside Disposition Plan:  Status is: Inpatient  Remains inpatient appropriate because:Inpatient level of care appropriate due to severity of illness   Dispo: The patient is from: Home              Anticipated d/c is to: SNF              Anticipated d/c date is: 3 days              Patient currently is not medically stable to d/c.  Consultants:   PCCM transfer  Gastroenterology   Procedures:   Antimicrobials:  Levaquin  Subjective: -Feels weak but doing better today, no specific complaints, spouse reports that she is more alert and interactive  Objective: Vitals:   01/16/20 1821 01/16/20 2129 01/17/20 0436 01/17/20 1027  BP: (!) 90/48 116/63 (!) 93/48 (!) 110/49  Pulse: 64 73 (!) 58 62  Resp: 18 18 18 18   Temp: 98.4 F (36.9 C) 98 F (36.7 C) 97.8 F (36.6 C) 98.8 F (37.1 C)  TempSrc: Oral   Oral  SpO2: 96% 98% 95% 98%  Weight:      Height:        Intake/Output Summary (Last 24 hours) at 01/17/2020 1438 Last data filed at 01/17/2020 1300 Gross per 24 hour  Intake 180 ml  Output 0 ml  Net 180 ml   Filed Weights   01/16/20 0542  Weight: 46.7 kg    Examination:  General exam: Chronically ill extremely cachectic female sitting up in bed, awake alert oriented to self and partly to place  only, flat affect HEENT: No JVD CVS: S1-S2, regular rhythm Lungs: Few basilar rales otherwise clear Abdomen: Soft, nontender, bowel sounds present Extremities: No edema Skin: No rashes on exposed skin Psychiatry: Flat affect    Data Reviewed:   CBC: Recent Labs  Lab 01/12/20 1619 01/15/20 1901 01/16/20 0517  01/17/20 0456  WBC 13.1* 20.3* 17.3* 18.4*  NEUTROABS  --  17.6* 15.4*  --   HGB 11.5* 10.5* 7.7* 7.8*  HCT 35.1* 32.2* 24.1* 24.0*  MCV 81.1 80.7 80.3 79.2*  PLT 373 334 268 182   Basic Metabolic Panel: Recent Labs  Lab 01/12/20 1534 01/15/20 1901 01/16/20 0946 01/16/20 1240 01/17/20 0456  NA 135 133* 164* 133* 135  K 4.0 4.2 3.2* 3.5 3.6  CL 101 101 81* 106 108  CO2 22 23 12* 19* 19*  GLUCOSE 97 101* 72 91 86  BUN 17 22 12 14 11   CREATININE 0.95 1.18* 0.67 0.83 0.82  CALCIUM 8.4* 8.3* <4.0* 7.3* 7.2*  MG  --  1.9  --   --   --   PHOS  --  2.7  --   --   --    GFR: Estimated Creatinine Clearance: 38.3 mL/min (by C-G formula based on SCr of 0.82 mg/dL). Liver Function Tests: Recent Labs  Lab 01/15/20 1901 01/16/20 0946 01/17/20 0456  AST 125* 68* 73*  ALT 92* 53* 55*  ALKPHOS 101 60 73  BILITOT 2.5* 1.5* 1.8*  PROT 7.0 4.5* 4.7*  ALBUMIN 1.9* 1.4* 1.4*   No results for input(s): LIPASE, AMYLASE in the last 168 hours. Recent Labs  Lab 01/15/20 2237  AMMONIA 21   Coagulation Profile: Recent Labs  Lab 01/16/20 0946  INR 1.5*   Cardiac Enzymes: Recent Labs  Lab 01/15/20 2337  CKTOTAL 534*   BNP (last 3 results) No results for input(s): PROBNP in the last 8760 hours. HbA1C: Recent Labs    01/16/20 0518  HGBA1C 5.1   CBG: Recent Labs  Lab 01/16/20 1503 01/17/20 0148 01/17/20 0436 01/17/20 0739 01/17/20 1131  GLUCAP 90 105* 87 76 87   Lipid Profile: No results for input(s): CHOL, HDL, LDLCALC, TRIG, CHOLHDL, LDLDIRECT in the last 72 hours. Thyroid Function Tests: Recent Labs    01/16/20 0516  TSH 1.277   Anemia Panel: Recent Labs    01/15/20 2337 01/16/20 0516  VITAMINB12  --  491  FOLATE 6.5  --   FERRITIN  --  2,258*  TIBC  --  NOT CALCULATED  IRON  --  16*  RETICCTPCT 1.7  --    Urine analysis:    Component Value Date/Time   COLORURINE AMBER (A) 01/15/2020 1955   APPEARANCEUR CLOUDY (A) 01/15/2020 1955   LABSPEC 1.024  01/15/2020 1955   PHURINE 5.0 01/15/2020 1955   GLUCOSEU NEGATIVE 01/15/2020 1955   HGBUR MODERATE (A) 01/15/2020 1955   BILIRUBINUR SMALL (A) 01/15/2020 1955   KETONESUR NEGATIVE 01/15/2020 1955   PROTEINUR 30 (A) 01/15/2020 1955   UROBILINOGEN 0.2 09/08/2010 1330   NITRITE NEGATIVE 01/15/2020 1955   LEUKOCYTESUR NEGATIVE 01/15/2020 1955   Sepsis Labs: @LABRCNTIP (procalcitonin:4,lacticidven:4)  ) Recent Results (from the past 240 hour(s))  SARS Coronavirus 2 by RT PCR (hospital order, performed in [place] hospital lab) Nasopharyngeal Nasopharyngeal Swab     Status: None   Collection Time: 01/15/20  7:00 PM   Specimen: Nasopharyngeal Swab  Result Value Ref Range Status   SARS Coronavirus 2 NEGATIVE NEGATIVE Final    Comment: (NOTE) SARS-CoV-2  target nucleic acids are NOT DETECTED.  The SARS-CoV-2 RNA is generally detectable in upper and lower respiratory specimens during the acute phase of infection. The lowest concentration of SARS-CoV-2 viral copies this assay can detect is 250 copies / mL. A negative result does not preclude SARS-CoV-2 infection and should not be used as the sole basis for treatment or other patient management decisions.  A negative result may occur with improper specimen collection / handling, submission of specimen other than nasopharyngeal swab, presence of viral mutation(s) within the areas targeted by this assay, and inadequate number of viral copies (<250 copies / mL). A negative result must be combined with clinical observations, patient history, and epidemiological information.  Fact Sheet for Patients:   StrictlyIdeas.no  Fact Sheet for Healthcare Providers: BankingDealers.co.za  This test is not yet approved or  cleared by the [place] FDA and has been authorized for detection and/or diagnosis of SARS-CoV-2 by FDA under an Emergency Use Authorization (EUA).  This EUA will remain in effect  (meaning this test can be used) for the duration of the COVID-19 declaration under Section 564(b)(1) of the Act, 21 U.S.C. section 360bbb-3(b)(1), unless the authorization is terminated or revoked sooner.  Performed at [facility], [place] [address] [place], [place] [address]   Culture, blood (Routine x 2)     Status: None (Preliminary result)   Collection Time: 01/15/20  7:01 PM   Specimen: BLOOD  Result Value Ref Range Status   Specimen Description BLOOD SITE NOT SPECIFIED  Final   Special Requests   Final    BOTTLES DRAWN AEROBIC AND ANAEROBIC Blood Culture adequate volume   Culture   Final    NO GROWTH 2 DAYS Performed at [facility], [place] [address] [place], [place] [address]    Report Status PENDING  Incomplete  Culture, blood (Routine x 2)     Status: None (Preliminary result)   Collection Time: 01/15/20  7:48 PM   Specimen: BLOOD RIGHT FOREARM  Result Value Ref Range Status   Specimen Description BLOOD RIGHT FOREARM  Final   Special Requests   Final    BOTTLES DRAWN AEROBIC ONLY Blood Culture results may not be optimal due to an inadequate volume of blood received in culture bottles   Culture   Final    NO GROWTH 2 DAYS Performed at [facility], [place] [address], [place], [place] [address]    Report Status PENDING  Incomplete  Urine culture     Status: Abnormal (Preliminary result)   Collection Time: 01/15/20  7:55 PM   Specimen: Urine, Random  Result Value Ref Range Status   Specimen Description URINE, RANDOM  Final   Special Requests NONE  Final   Culture (A)  Final    >=100,000 COLONIES/mL KLEBSIELLA PNEUMONIAE SUSCEPTIBILITIES TO FOLLOW Performed at [facility], [place] [address] [place], [place] [address]    Report Status PENDING  Incomplete  MRSA PCR Screening     Status: None   Collection Time: 01/16/20  5:41 AM   Specimen: Nasal Mucosa; Nasopharyngeal  Result Value Ref Range Status   MRSA by PCR NEGATIVE NEGATIVE Final    Comment:         The GeneXpert MRSA Assay (FDA approved for NASAL specimens only), is one component of a comprehensive MRSA colonization surveillance program. It is not intended to diagnose MRSA infection nor to guide or monitor treatment for MRSA infections. Performed at [facility],  [address], [place], [place] [address]          Radiology Studies: DG Chest 2 View  Result Date: 01/15/2020 CLINICAL DATA:  Ongoing weakness. EXAM: CHEST - 2 VIEW COMPARISON:  April 23, 2018 FINDINGS: Mild areas of atelectasis and/or infiltrate are seen within the bilateral lung bases, left greater than right. Very small bilateral pleural effusions are seen. No pneumothorax is identified. The cardiac silhouette is mildly enlarged. Mild calcification of the aortic arch is noted. Multilevel degenerative changes seen throughout the thoracic spine. Radiopaque surgical clips are seen within the right upper quadrant. IMPRESSION: 1. Mild bibasilar atelectasis and/or infiltrate, left greater than right. 2. Very small bilateral pleural effusions. Electronically Signed   By: Virgina Norfolk [name]   On: 01/15/2020 18:58   CT ABDOMEN PELVIS W CONTRAST  Result Date: 01/15/2020 CLINICAL DATA:  Unintended weight loss. Elevated liver function studies. Abnormal white cell count and lactic. EXAM: CT ABDOMEN AND PELVIS WITH CONTRAST TECHNIQUE: Multidetector CT imaging of the abdomen and pelvis was performed using the standard protocol following bolus administration of intravenous contrast. CONTRAST:  72mL OMNIPAQUE IOHEXOL 300 MG/ML  SOLN COMPARISON:  04/24/2018 FINDINGS: Lower chest: Consolidation in both lung bases.  No effusions. Hepatobiliary: No focal liver abnormality is seen. Status post cholecystectomy. No biliary dilatation. Pancreas: Unremarkable. No pancreatic ductal dilatation or surrounding inflammatory changes. Spleen: Normal in size without focal abnormality. Adrenals/Urinary Tract: Adrenal glands are  unremarkable. Kidneys are normal, without renal calculi, focal lesion, or hydronephrosis. Bladder is unremarkable. Stomach/Bowel: Stomach is decompressed. Small bowel are decompressed. Scattered gas and stool in the colon with residual contrast material mostly in the descending and sigmoid colon. No colonic dilatation. Decompression of bowel limits evaluation of the bowel wall but there is suggestion of some small bowel and scattered colonic wall thickening. This may indicate enteritis. No abscess or mesenteric infiltration is identified. Although not overtly distended, there is a change in caliber of the colon at the level of the splenic flexure possibly indicating a focal lesion. Consider endoscopy if clinically indicated to exclude colon cancer. The appendix is normal. Vascular/Lymphatic: Prominent aortic calcification. No aneurysm. No significant lymphadenopathy. Reproductive: Uterus and bilateral adnexa are unremarkable. Other: No abdominal wall hernia or abnormality. No abdominopelvic ascites. Musculoskeletal: Degenerative changes in the spine. Lumbar scoliosis convex towards the right. No destructive bone lesions. IMPRESSION: 1. Consolidation in both lung bases. 2. Decompression of bowel limits evaluation of the bowel wall but there is suggestion of some small bowel and scattered colonic wall thickening. This may indicate enteritis. No abscess or mesenteric infiltration is identified. 3. Although not overtly distended, there is a change in caliber of the colon at the level of the splenic flexure possibly indicating a focal lesion. Consider endoscopy if clinically indicated to exclude colon cancer. 4. Aortic atherosclerosis. Aortic Atherosclerosis (ICD10-I70.0). Electronically Signed   By: Lucienne Capers [name]   On: 01/15/2020 23:46   ECHOCARDIOGRAM COMPLETE  Result Date: 01/16/2020    ECHOCARDIOGRAM REPORT   Patient Name:   Kerry Barnes Date of Exam: 01/16/2020 Medical Rec #:  [id number]           Height:       61.0 in Accession #:    [id number]         Weight:       103.0 lb Date of Birth:  01/22/1934          BSA:          1.424 m Patient Age:  85 years           BP:           107/55 mmHg Patient Gender: F                  HR:           62 bpm. Exam Location:  Inpatient Procedure: 2D Echo, Cardiac Doppler and Color Doppler Indications:    Atrial fibrillation  History:        Patient has prior history of Echocardiogram examinations.                 Pulmonary HTN, Mitral Valve Disease, Arrythmias:Atrial Flutter;                 Risk Factors:Diabetes and Dyslipidemia. CKD.  Sonographer:    Clayton Lefort RDCS (AE) Referring Phys: [id number] [place]  1. Left ventricular ejection fraction, by estimation, is 60 to 65%. The left ventricle has normal function. The left ventricle has no regional wall motion abnormalities. Left ventricular diastolic function could not be evaluated.  2. Right ventricular systolic function is normal. The right ventricular size is normal. There is mildly elevated pulmonary artery systolic pressure. The estimated right ventricular systolic pressure is 16.0 mmHg.  3. Left atrial size was moderately dilated.  4. The mitral valve is rheumatic. Mild to moderate mitral valve regurgitation. Mild mitral stenosis. The mean mitral valve gradient is 3.0 mmHg with average heart rate of 62 bpm.  5. The aortic valve is normal in structure. Aortic valve regurgitation is not visualized. No aortic stenosis is present.  6. The inferior vena cava is normal in size with greater than 50% respiratory variability, suggesting right atrial pressure of 3 mmHg. Comparison(s): Prior images unable to be directly viewed, comparison made by report only. FINDINGS  Left Ventricle: Left ventricular ejection fraction, by estimation, is 60 to 65%. The left ventricle has normal function. The left ventricle has no regional wall motion abnormalities. The left ventricular internal cavity size was normal in  size. There is  no left ventricular hypertrophy. Left ventricular diastolic function could not be evaluated due to mitral stenosis. Left ventricular diastolic function could not be evaluated. Right Ventricle: The right ventricular size is normal. No increase in right ventricular wall thickness. Right ventricular systolic function is normal. There is mildly elevated pulmonary artery systolic pressure. The tricuspid regurgitant velocity is 3.24  m/s, and with an assumed right atrial pressure of 3 mmHg, the estimated right ventricular systolic pressure is 73.7 mmHg. Left Atrium: Left atrial size was moderately dilated. Right Atrium: Right atrial size was normal in size. Pericardium: There is no evidence of pericardial effusion. Mitral Valve: The mitral valve is rheumatic. There is mild thickening of the mitral valve leaflet(s). Normal mobility of the mitral valve leaflets. Mild mitral annular calcification. Mild to moderate mitral valve regurgitation. Mild mitral valve stenosis. The mean mitral valve gradient is 3.0 mmHg with average heart rate of 62 bpm. Tricuspid Valve: The tricuspid valve is normal in structure. Tricuspid valve regurgitation is mild . No evidence of tricuspid stenosis. Aortic Valve: The aortic valve is normal in structure. Aortic valve regurgitation is not visualized. No aortic stenosis is present. Aortic valve mean gradient measures 5.0 mmHg. Aortic valve peak gradient measures 8.8 mmHg. Aortic valve area, by VTI measures 1.71 cm. Pulmonic Valve: The pulmonic valve was normal in structure. Pulmonic valve regurgitation is not visualized. No evidence of pulmonic stenosis. Aorta: The aortic root is  normal in size and structure. Venous: The inferior vena cava is normal in size with greater than 50% respiratory variability, suggesting right atrial pressure of 3 mmHg. IAS/Shunts: No atrial level shunt detected by color flow Doppler.  LEFT VENTRICLE PLAX 2D LVIDd:         4.40 cm LVIDs:         2.80 cm  LV PW:         1.10 cm LV IVS:        0.80 cm LVOT diam:     1.80 cm LV SV:         53 LV SV Index:   38 LVOT Area:     2.54 cm  RIGHT VENTRICLE             IVC RV Basal diam:  3.20 cm     IVC diam: 1.80 cm RV S prime:     14.10 cm/s TAPSE (M-mode): 2.1 cm LEFT ATRIUM           Index       RIGHT ATRIUM           Index LA diam:      3.90 cm 2.74 cm/m  RA Area:     13.30 cm LA Vol (A2C): 55.0 ml 38.61 ml/m RA Volume:   31.80 ml  22.33 ml/m LA Vol (A4C): 69.1 ml 48.51 ml/m  AORTIC VALVE AV Area (Vmax):    1.81 cm AV Area (Vmean):   1.53 cm AV Area (VTI):     1.71 cm AV Vmax:           148.00 cm/s AV Vmean:          108.000 cm/s AV VTI:            0.312 m AV Peak Grad:      8.8 mmHg AV Mean Grad:      5.0 mmHg LVOT Vmax:         105.00 cm/s LVOT Vmean:        64.900 cm/s LVOT VTI:          0.210 m LVOT/AV VTI ratio: 0.67  AORTA Ao Root diam: 2.90 cm Ao Asc diam:  3.20 cm MITRAL VALVE                 TRICUSPID VALVE MV Area (PHT): 1.80 cm      TR Peak grad:   42.0 mmHg MV Mean grad:  3.0 mmHg      TR Vmax:        324.00 cm/s MV Decel Time: 421 msec MR Peak grad:    86.5 mmHg   SHUNTS MR Mean grad:    30.5 mmHg   Systemic VTI:  0.21 m MR Vmax:         465.00 cm/s Systemic Diam: 1.80 cm MR Vmean:        352.0 cm/s MR PISA:         1.01 cm MR PISA Eff ROA: 9 mm MR PISA Radius:  0.40 cm Mihai Croitoru MD Electronically signed by Sanda Klein MD Signature Date/Time: 01/16/2020/12:52:12 PM    Final         Scheduled Meds: . amiodarone  200 mg Oral BID  . atorvastatin  40 mg Oral Daily  . collagenase   Topical Daily  . docusate sodium  100 mg Oral BID  . insulin aspart  0-9 Units Subcutaneous Q4H  . mouth rinse  15 mL Mouth Rinse BID  .  midodrine  5 mg Oral BID WC  . sodium chloride flush  3 mL Intravenous Q12H   Continuous Infusions: . ferumoxytol    . levofloxacin (LEVAQUIN) IV 500 mg (01/16/20 1213)     LOS: 1 day    Time spent: 37min  Domenic Polite, MD [facility]  01/17/2020,  2:38 PM

## 2020-01-17 NOTE — Progress Notes (Signed)
Initial Nutrition Assessment  DOCUMENTATION CODES:   Severe malnutrition in context of chronic illness  INTERVENTION:  Recommend liberalizing diet  Ensure Enlive po BID, each supplement provides 350 kcal and 20 grams of protein  MVI daily  Magic cup TID with meals, each supplement provides 290 kcal and 9 grams of protein   NUTRITION DIAGNOSIS:   Severe Malnutrition related to chronic illness as evidenced by severe muscle depletion, severe fat depletion, energy intake < or equal to 75% for > or equal to 1 month, percent weight loss.    GOAL:   Patient will meet greater than or equal to 90% of their needs    MONITOR:   Supplement acceptance, PO intake, Labs, I & O's, Weight trends, Skin  REASON FOR ASSESSMENT:   Consult Malnutrition Eval  ASSESSMENT:   Pt admitted with sepsis 2/2 multifocal pneumonia and elevated LFTs. PMH significant for CKD stage III, Afib, CHF, HTN, HLD, T2DM.  8/2 CT abd/pelvis revealed lower lobe consolidation and change in caliber of colon at splenic flexure with possible focal lesion concerning for colon cancer. GI consulted and recommending colonoscopy.   Pt lethargic at time of visit. Pt states her appetite is "alright" and reports eating 1-2 meals per day. She reports typically eating applesauce for breakfast and spaghetti for lunch. Confirmed with pt this is what she ate PTA and not today. Pt states her UBW is 135 lbs and that she is now closer to 120 lbs. When asked about the cause of her wt loss, she responded "neuropathy."  Per wt readings, pt weighed 60.3kg on 03/27/19 and now weighs 46.7 kg. This indicates a 22.38% wt loss over the last 10 months, which is significant for time frame.  PO Intake: 25% x 2 recorded meals  Labs: CBGs 76-87-90 Medications: Colace, Novolog  NUTRITION - FOCUSED PHYSICAL EXAM:    Most Recent Value  Orbital Region Mild depletion  Upper Arm Region Severe depletion  Thoracic and Lumbar Region Severe  depletion  Buccal Region Moderate depletion  Temple Region Moderate depletion  Clavicle Bone Region Severe depletion  Clavicle and Acromion Bone Region Severe depletion  Scapular Bone Region Severe depletion  Dorsal Hand Moderate depletion  Patellar Region Severe depletion  Anterior Thigh Region Severe depletion  Posterior Calf Region Severe depletion  Edema (RD Assessment) Mild  Hair Reviewed  Eyes Reviewed  Mouth Reviewed  Skin Reviewed  Nails Reviewed       Diet Order:   Diet Order            Diet Carb Modified Fluid consistency: Thin; Room service appropriate? Yes with Assist  Diet effective now                 EDUCATION NEEDS:   Not appropriate for education at this time  Skin:  Skin Assessment: Skin Integrity Issues: Skin Integrity Issues:: Unstageable Unstageable: sacrum  Last BM:  8/3  Height:   Ht Readings from Last 1 Encounters:  01/16/20 5\' 1"  (1.549 m)    Weight:   Wt Readings from Last 3 Encounters:  01/16/20 46.7 kg  03/27/19 60.3 kg  08/29/18 64.5 kg    BMI:  Body mass index is 19.45 kg/m.  Estimated Nutritional Needs:   Kcal:  1300-1500  Protein:  65-75 grams  Fluid:  >/=1.3L/d    Larkin Ina, MS, RD, LDN RD pager number and weekend/on-call pager number located in [place].

## 2020-01-17 NOTE — Consult Note (Addendum)
[facility] Consult: 12:51 PM 01/17/2020  LOS: 1 day    Referring Provider: Dr Domenic Polite  Primary Care Physician:  Aletha Halim., PA-C Primary Gastroenterologist:  Dr Hilarie Fredrickson    Reason for Consultation:  Evaluate for ? lesion at splenic flexure.     HPI: Kerry Barnes is a 86 y.o. female.  PMH A. fib, on Eliquis.  IBS/chronic constipation, on Linzess in past. GERD.  Dysphagia due to esophageal dysmotility previous positive responses to empiric  esophageal dilatation.  Colon polyps.  CKD 3.  Fibromyalgia.  TIA.  DM2, not on insulin.  Multiple other medical issues outlined below.  10/2013 EGD.  For recurrent solid dysphagia.  Esophagus, stomach, examined duodenum normal.  Empiric balloon dilation to 18 mm in lower third of esophagus across GE junction.  01/2015 colonoscopy.  Follow-up of positive Cologuard, normal colonoscopy 10 years prior.  Three, 2 to 4 mm sized, sessile polyps at cecum and transverse colon. Path:  1 lymphoid aggregate, 2 TAs with no HGD etc.  No recall colonoscopy due to advanced pt age.   07/2016 Ba esophagram: Mild to moderate esophageal dysmotility.  Small HH.  Mild GE reflux.  No esophageal stricture 03/16/2017 EGD.  For recurrent dysphagia.  Normal esophagus, stomach, examined duodenum..  Due to prior positive response to esophageal dilatation, Dr. Hilarie Fredrickson elected to perform balloon dilation to 18 mm in lower esophagus/GE junction.  Presented to ED 7/30 w acute on long-term chronic bil leg pain.  PMD has diagnosed this as peripheral neuropathy.  Unilateral edema on exam, LE dopplers unremarkable.  Discharged home, advised to take tylenol prn.   Returned to ED 8/2 and then admitted w progressive weakness present for months.  Mostly sedentary, not able to ambulate and developed ulcer on  buttocks.  Loose stools, no blood. Pt says stools loose x 3 years, 2 x daily, color brown to yellow.   No abd or rectal pain.  Pt denies fecal incontinence.  + Anorexia, decreased po intake.  Denies dysphagia, choking w po.  No n/v, fever or chills.   BMI is 19.     BPs as low as 89/46 with ongoing soft blood pressures.  No tachycardia, no fever. Na 164 >> 135.  Renal fx preserved.   Hgb 7.7, MCV 79 WBCs 17.3 >> 18.4.  Platelets normal.   INR 1.5.  T bili 1.8.  Alk phos 73.  AST/ALT 73/55.     CTAP w contrast: bil basilar lung consolidation.  Bowel decompressed which limits evaluation but some suggestion of SB and scattered colonic wall thickening possibly indicating enteritis.  Change in caliber at: At level of splenic flexure might indicate focal lesion.  Aortic atherosclerosis.  S/p cholecystectomy.  No liver, pancreatic, splenic, biliary tree abnormalities. SLP BS swallow eval: normal.  Cleared for solid food, thin liquids.         Past Medical History:  Diagnosis Date  . Anxiety   . Arthritis   . Asthma   . Atypical atrial flutter ([place])   . Cataract   . Chronic  diastolic CHF (congestive heart failure) ([place])   . Fibromyalgia   . GERD (gastroesophageal reflux disease)   . History of blood transfusion   . Hyperlipemia   . Hypertension   . IBS (irritable bowel syndrome)   . Seasonal allergies   . Stroke [facility]) 2013   TIA  . Tubular adenoma of colon   . Type 2 diabetes mellitus ([place])     Past Surgical History:  Procedure Laterality Date  . ANKLE FRACTURE SURGERY    . CARDIOVERSION N/A 02/04/2016   Procedure: CARDIOVERSION;  Surgeon: Sueanne Margarita, MD;  Location: [facility] ENDOSCOPY;  Service: Cardiovascular;  Laterality: N/A;  . CARDIOVERSION N/A 05/01/2016   Procedure: CARDIOVERSION;  Surgeon: Fay Records, MD;  Location: [facility] ENDOSCOPY;  Service: Cardiovascular;  Laterality: N/A;  . CARDIOVERSION N/A 04/25/2018   Procedure: CARDIOVERSION;  Surgeon: [name], MD;  Location: [place];  Service: Cardiovascular;  Laterality: N/A;  . CHOLECYSTECTOMY    . TEE WITHOUT CARDIOVERSION N/A 02/04/2016   Procedure: TRANSESOPHAGEAL ECHOCARDIOGRAM (TEE);  Surgeon: Sueanne Margarita, MD;  Location: [facility] ENDOSCOPY;  Service: Cardiovascular;  Laterality: N/A;  . TUBAL LIGATION    . WRIST FRACTURE SURGERY      Prior to Admission medications   Medication Sig Start Date End Date Taking? Authorizing Provider  acetaminophen (TYLENOL) 325 MG tablet Take 650 mg by mouth every 6 (six) hours as needed for moderate pain.    Yes [provider]  albuterol (PROVENTIL HFA;VENTOLIN HFA) 108 (90 BASE) MCG/ACT inhaler Inhale 2 puffs into the lungs every 6 (six) hours as needed for wheezing or shortness of breath.    Yes [provider]  amiodarone (PACERONE) 200 MG tablet TAKE 1 TABLET BY MOUTH TWICE DAILY Patient taking differently: Take 200 mg by mouth 2 (two) times daily.  08/15/19  Yes Patwardhan, Manish J, MD  amLODipine (NORVASC) 5 MG tablet Take 5 mg by mouth daily.  10/17/18  Yes [provider]  atorvastatin (LIPITOR) 40 MG tablet Take 40 mg by mouth daily.   Yes [provider]  ELIQUIS 2.5 MG TABS tablet TAKE 1 TABLET(2.5 MG) BY MOUTH TWICE DAILY Patient taking differently: Take 2.5 mg by mouth 2 (two) times daily.  01/10/20  Yes Patwardhan, Manish J, MD  EPINEPHrine (EPI-PEN) 0.3 mg/0.3 mL SOAJ injection Inject 0.3 mg once as needed into the muscle (severe allergic reaction).    Yes [provider]  gabapentin (NEURONTIN) 100 MG capsule Take 1 capsule (100 mg total) by mouth 3 (three) times daily. 01/12/20  Yes Deno Etienne, DO  ibandronate (BONIVA) 150 MG tablet Take 150 mg by mouth every 30 (thirty) days. 10/25/19  Yes [provider]  mometasone (NASONEX) 50 MCG/ACT nasal spray Place 2 sprays into the nose daily as needed (nasal congestion).    Yes [provider]  Propylene Glycol (SYSTANE BALANCE OP) Place 1 drop daily as needed  into both eyes (dryness).    Yes [provider]  solifenacin (VESICARE) 5 MG tablet Take 5 mg by mouth daily. 09/02/17  Yes [provider]  trimethoprim-polymyxin b (POLYTRIM) ophthalmic solution Place 1 drop into the left eye 4 (four) times daily. 02/03/18  Yes [provider]  LINZESS 290 MCG CAPS capsule TAKE 1 CAPSULE BY MOUTH DAILY BEFORE BREAKFAST Patient not taking: Reported on 01/16/2020 07/27/19   Pyrtle, Lajuan Lines, MD  methocarbamol (ROBAXIN) 500 MG tablet Take 1 tablet (500 mg total) every 6 (six) hours as needed by mouth  for muscle spasms. Patient not taking: Reported on 01/16/2020 04/27/17   Vann, Jessica U, DO    Scheduled Meds: . amiodarone  200 mg Oral BID  . atorvastatin  40 mg Oral Daily  . collagenase   Topical Daily  . docusate sodium  100 mg Oral BID  . insulin aspart  0-9 Units Subcutaneous Q4H  . mouth rinse  15 mL Mouth Rinse BID  . midodrine  5 mg Oral BID WC  . sodium chloride flush  3 mL Intravenous Q12H   Infusions: . levofloxacin (LEVAQUIN) IV 500 mg (01/16/20 1213)   PRN Meds: albuterol, docusate sodium, ondansetron **OR** ondansetron (ZOFRAN) IV, polyethylene glycol   Allergies as of 01/15/2020 - Review Complete 01/15/2020  Allergen Reaction Noted  . Codeine Nausea Only 10/10/2013  . Metformin Diarrhea 07/12/2015  . Amoxicillin Rash 11/01/2006  . Biaxin [clarithromycin] Rash 10/10/2013  . Ciprofloxacin Rash 04/04/2007  . Penicillins Rash 04/04/2007    Family History  Problem Relation Age of Onset  . Diabetes Mother   . Hypertension Mother   . Diabetes Father   . Diabetes Sister   . Diabetes Brother   . Hypertension Brother   . Colon cancer Neg Hx   . Stomach cancer Neg Hx     Social History   Socioeconomic History  . Marital status: Married    Spouse name: Not on file  . Number of children: 8  . Years of education: Not on file  . Highest education level: Not on file  Occupational History  . Occupation: Housewife     Employer: RETIRED  Tobacco Use  . Smoking status: Never Smoker  . Smokeless tobacco: Never Used  Vaping Use  . Vaping Use: Never used  Substance and Sexual Activity  . Alcohol use: No    Alcohol/week: 0.0 standard drinks  . Drug use: No  . Sexual activity: Never    Partners: Male  Other Topics Concern  . Not on file  Social History Narrative   Grandchildren 14      Great Grandchildren 13   Social Determinants of Health   Financial Resource Strain:   . Difficulty of Paying Living Expenses:   Food Insecurity:   . Worried About Running Out of Food in the Last Year:   . Ran Out of Food in the Last Year:   Transportation Needs:   . Lack of Transportation (Medical):   . Lack of Transportation (Non-Medical):   Physical Activity:   . Days of Exercise per Week:   . Minutes of Exercise per Session:   Stress:   . Feeling of Stress :   Social Connections:   . Frequency of Communication with Friends and Family:   . Frequency of Social Gatherings with Friends and Family:   . Attends Religious Services:   . Active Member of Clubs or Organizations:   . Attends Club or Organization Meetings:   . Marital Status:   Intimate Partner Violence:   . Fear of Current or Ex-Partner:   . Emotionally Abused:   . Physically Abused:   . Sexually Abused:     REVIEW OF SYSTEMS: Constitutional: Weakness, fatigue ENT:  No nose bleeds Pulm: No shortness of breath.  No cough. CV:  No palpitations, no LE edema.  GU:  No hematuria, no frequency GI: See HPI. Heme: Denies unusual bleeding or bruising. Transfusions: None. Neuro: Pain in lower extremities.  No headaches, no dizziness, no syncope, no seizures. Derm:  No itching, no rash or sores.    Endocrine:  No sweats or chills.  No polyuria or dysuria Immunization: Not queried Travel:  None beyond local counties in last few months.    PHYSICAL EXAM: Vital signs in last 24 hours: Vitals:   01/17/20 0436 01/17/20 1027  BP: (!) 93/48 (!)  110/49  Pulse: (!) 58 62  Resp: 18 18  Temp: 97.8 F (36.6 C) 98.8 F (37.1 C)  SpO2: 95% 98%   Wt Readings from Last 3 Encounters:  01/16/20 46.7 kg  03/27/19 60.3 kg  08/29/18 64.5 kg   General: Frail, aged, looks unwell but not critically ill Head: No facial asymmetry or swelling.  No signs of head trauma Eyes: No scleral icterus.  No conjunctival pallor. Ears: Not hard of hearing Nose: No congestion or discharge. Mouth: Tongue midline.  Mucosa slightly dry but clear. Neck: No JVD, no masses, no thyromegaly Lungs: Diminished sounds in bases but clear.  No labored breathing, no cough Heart: RRR.  NSR in 60s on telemetry monitor.  S1, S2 audible.  No MRG. Abdomen: Soft, nontender.  Bowel sounds normal quality but hypoactive.  Fullness in mid lower abdomen/pelvis without discrete mass.  No HSM, bruits, hernias.   Rectal: Deferred Musc/Skeltl: No joint redness, swelling or gross deformity Extremities: No CCE. Neurologic: Oriented to self, her birthdate, [place].  Could not tell me what year it was or name the [place] president Skin: No obvious rash or suspicious lesions. Tattoos: None Nodes: No cervical adenopathy Psych: Flat affect, cooperative, paucity of speech.  Intake/Output from previous day: 08/03 0701 - 08/04 0700 In: 695.3 [P.O.:60; I.V.:316.9; IV Piggyback:318.4] Out: 0  Intake/Output this shift: No intake/output data recorded.  LAB RESULTS: Recent Labs    01/15/20 1901 01/16/20 0517 01/17/20 0456  WBC 20.3* 17.3* 18.4*  HGB 10.5* 7.7* 7.8*  HCT 32.2* 24.1* 24.0*  PLT 334 268 250   BMET Lab Results  Component Value Date   NA 135 01/17/2020   NA 133 (L) 01/16/2020   NA 164 (HH) 01/16/2020   K 3.6 01/17/2020   K 3.5 01/16/2020   K 3.2 (L) 01/16/2020   CL 108 01/17/2020   CL 106 01/16/2020   CL 81 (L) 01/16/2020   CO2 19 (L) 01/17/2020   CO2 19 (L) 01/16/2020   CO2 12 (L) 01/16/2020   GLUCOSE 86 01/17/2020   GLUCOSE 91 01/16/2020   GLUCOSE 72  01/16/2020   BUN 11 01/17/2020   BUN 14 01/16/2020   BUN 12 01/16/2020   CREATININE 0.82 01/17/2020   CREATININE 0.83 01/16/2020   CREATININE 0.67 01/16/2020   CALCIUM 7.2 (L) 01/17/2020   CALCIUM 7.3 (L) 01/16/2020   CALCIUM <4.0 (LL) 01/16/2020   LFT Recent Labs    01/15/20 1901 01/16/20 0946 01/17/20 0456  PROT 7.0 4.5* 4.7*  ALBUMIN 1.9* 1.4* 1.4*  AST 125* 68* 73*  ALT 92* 53* 55*  ALKPHOS 101 60 73  BILITOT 2.5* 1.5* 1.8*   PT/INR Lab Results  Component Value Date   INR 1.5 (H) 01/16/2020   INR 1.1 04/29/2016   INR 1.01 01/31/2016   Hepatitis Panel Recent Labs    01/15/20 2337  HEPBSAG NON REACTIVE  HCVAB NON REACTIVE  HEPAIGM NON REACTIVE  HEPBIGM NON REACTIVE   Lipase     Component Value Date/Time   LIPASE 90 (H) 04/23/2018 0629      RADIOLOGY STUDIES: DG Chest 2 View  Result Date: 01/15/2020 CLINICAL DATA:  Ongoing weakness. EXAM: CHEST - 2 VIEW COMPARISON:  April 23, 2018 FINDINGS: Mild areas of atelectasis and/or infiltrate are seen within the bilateral lung bases, left greater than right. Very small bilateral pleural effusions are seen. No pneumothorax is identified. The cardiac silhouette is mildly enlarged. Mild calcification of the aortic arch is noted. Multilevel degenerative changes seen throughout the thoracic spine. Radiopaque surgical clips are seen within the right upper quadrant. IMPRESSION: 1. Mild bibasilar atelectasis and/or infiltrate, left greater than right. 2. Very small bilateral pleural effusions. Electronically Signed   By: Virgina Norfolk [name]   On: 01/15/2020 18:58   CT ABDOMEN PELVIS W CONTRAST  Result Date: 01/15/2020 CLINICAL DATA:  Unintended weight loss. Elevated liver function studies. Abnormal white cell count and lactic. EXAM: CT ABDOMEN AND PELVIS WITH CONTRAST TECHNIQUE: Multidetector CT imaging of the abdomen and pelvis was performed using the standard protocol following bolus administration of intravenous contrast.  CONTRAST:  26m OMNIPAQUE IOHEXOL 300 MG/ML  SOLN COMPARISON:  04/24/2018 FINDINGS: Lower chest: Consolidation in both lung bases.  No effusions. Hepatobiliary: No focal liver abnormality is seen. Status post cholecystectomy. No biliary dilatation. Pancreas: Unremarkable. No pancreatic ductal dilatation or surrounding inflammatory changes. Spleen: Normal in size without focal abnormality. Adrenals/Urinary Tract: Adrenal glands are unremarkable. Kidneys are normal, without renal calculi, focal lesion, or hydronephrosis. Bladder is unremarkable. Stomach/Bowel: Stomach is decompressed. Small bowel are decompressed. Scattered gas and stool in the colon with residual contrast material mostly in the descending and sigmoid colon. No colonic dilatation. Decompression of bowel limits evaluation of the bowel wall but there is suggestion of some small bowel and scattered colonic wall thickening. This may indicate enteritis. No abscess or mesenteric infiltration is identified. Although not overtly distended, there is a change in caliber of the colon at the level of the splenic flexure possibly indicating a focal lesion. Consider endoscopy if clinically indicated to exclude colon cancer. The appendix is normal. Vascular/Lymphatic: Prominent aortic calcification. No aneurysm. No significant lymphadenopathy. Reproductive: Uterus and bilateral adnexa are unremarkable. Other: No abdominal wall hernia or abnormality. No abdominopelvic ascites. Musculoskeletal: Degenerative changes in the spine. Lumbar scoliosis convex towards the right. No destructive bone lesions. IMPRESSION: 1. Consolidation in both lung bases. 2. Decompression of bowel limits evaluation of the bowel wall but there is suggestion of some small bowel and scattered colonic wall thickening. This may indicate enteritis. No abscess or mesenteric infiltration is identified. 3. Although not overtly distended, there is a change in caliber of the colon at the level of the  splenic flexure possibly indicating a focal lesion. Consider endoscopy if clinically indicated to exclude colon cancer. 4. Aortic atherosclerosis. Aortic Atherosclerosis (ICD10-I70.0). Electronically Signed   By: WLucienne CapersM.D.   On: 01/15/2020 23:46   ECHOCARDIOGRAM COMPLETE  Result Date: 01/16/2020 IMPRESSIONS  1. Left ventricular ejection fraction, by estimation, is 60 to 65%. The left ventricle has normal function. The left ventricle has no regional wall motion abnormalities. Left ventricular diastolic function could not be evaluated.  2. Right ventricular systolic function is normal. The right ventricular size is normal. There is mildly elevated pulmonary artery systolic pressure. The estimated right ventricular systolic pressure is 419.5mmHg.  3. Left atrial size was moderately dilated.  4. The mitral valve is rheumatic. Mild to moderate mitral valve regurgitation. Mild mitral stenosis. The mean mitral valve gradient is 3.0 mmHg with average heart rate of 62 bpm.  5. The aortic valve is normal in structure. Aortic valve regurgitation is not visualized. No aortic stenosis is present.  6. The inferior vena  cava is normal in size with greater than 50% respiratory variability, suggesting right atrial pressure of 3 mmHg. Comparison(s): Prior images unable to be directly viewed, comparison made by report only. Electronically signed by Mihai Croitoru MD Signature Date/Time: 01/16/2020/12:52:12 PM     IMPRESSION:   *   FTT, unintentional wt loss, anorexia, loose stools.  Previous hx IBS-C.   CT suggests thickening of SB and colon, ? Enteritis.  Also change in conic caliber at splenic flexure.   01/2015 Colonoscopy w TA (#2) and lymphoid aggregate (#1) polyps.  No surveillance colonoscopy since then due to pt age.  hypoalbuminema may be cause of thickening seen on CT    *   Elevated LFTs: T bili and transaminases.  GB out.  Liver, biliary tree, pancreas normal on CT.  ? elemant of hepatic  hypoperfusion in setting of hypotension?  Low albumin, suspect PCM.   *   Chronic eliquis for A fib.  On hold.  Unknown last dose but > 24 yrs ago.  *   Microcytic anemia.  Hgb 11.5 >> 7.8 in last 5 days, suspect some of this is dilutional as pt received IV boluses in ED.  Hgb was 12 to 13 in 2019.  Iron low at 16, TIBC/Sat not calculated.  Ferritin 2258.  Folate and B12 wnl.     *   Leukocytosis w/o fever.  CXR s/o pneumonia.  Levaquin d 2.    *   Hx dysphagia and Esophageal dysmotility.  No esophageal strictures but + response to empiric esoph dilatations in past.  Pt currently denies sxs s/o dysphagia.         PLAN:     *   Pre-albumin in AM.     *   ? Colonoscopy?  Dr Jacobs will see pt and decide.     *   Dose IV Feraheme.     Sarah Gribbin  01/17/2020, 12:51 PM Phone [phone]  ________________________________________________________________________  [facility] MD note:  I personally examined the patient, reviewed the data and agree with the assessment and plan described above.  The CT scan is pretty impressive with a distinct transition to normal diameter colon around the splenic flexure and proximally dilated colon.  I think colonoscopy is the best next step.  We will work towards Friday for that. I am going to start clears now.   Daniel Jacobs, MD [facility] Pager [phone]    

## 2020-01-18 DIAGNOSIS — E43 Unspecified severe protein-calorie malnutrition: Secondary | ICD-10-CM | POA: Insufficient documentation

## 2020-01-18 LAB — CBC
HCT: 24.5 % — ABNORMAL LOW (ref 36.0–46.0)
Hemoglobin: 8.1 g/dL — ABNORMAL LOW (ref 12.0–15.0)
MCH: 26.3 pg (ref 26.0–34.0)
MCHC: 33.1 g/dL (ref 30.0–36.0)
MCV: 79.5 fL — ABNORMAL LOW (ref 80.0–100.0)
Platelets: 313 10*3/uL (ref 150–400)
RBC: 3.08 MIL/uL — ABNORMAL LOW (ref 3.87–5.11)
RDW: 19.7 % — ABNORMAL HIGH (ref 11.5–15.5)
WBC: 16.9 10*3/uL — ABNORMAL HIGH (ref 4.0–10.5)
nRBC: 0 % (ref 0.0–0.2)

## 2020-01-18 LAB — HGB FRACTIONATION CASCADE
Hgb A2: 2.3 % (ref 1.8–3.2)
Hgb A: 97.7 % (ref 96.4–98.8)
Hgb F: 0 % (ref 0.0–2.0)
Hgb S: 0 %

## 2020-01-18 LAB — COMPREHENSIVE METABOLIC PANEL
ALT: 51 U/L — ABNORMAL HIGH (ref 0–44)
AST: 69 U/L — ABNORMAL HIGH (ref 15–41)
Albumin: 1.4 g/dL — ABNORMAL LOW (ref 3.5–5.0)
Alkaline Phosphatase: 88 U/L (ref 38–126)
Anion gap: 7 (ref 5–15)
BUN: 13 mg/dL (ref 8–23)
CO2: 21 mmol/L — ABNORMAL LOW (ref 22–32)
Calcium: 7.1 mg/dL — ABNORMAL LOW (ref 8.9–10.3)
Chloride: 106 mmol/L (ref 98–111)
Creatinine, Ser: 0.91 mg/dL (ref 0.44–1.00)
GFR calc Af Amer: >60 mL/min (ref >60)
GFR calc non Af Amer: 58 mL/min — ABNORMAL LOW (ref >60)
Glucose, Bld: 106 mg/dL — ABNORMAL HIGH (ref 70–99)
Potassium: 3.4 mmol/L — ABNORMAL LOW (ref 3.5–5.1)
Sodium: 134 mmol/L — ABNORMAL LOW (ref 135–145)
Total Bilirubin: 1.4 mg/dL — ABNORMAL HIGH (ref 0.3–1.2)
Total Protein: 5.1 g/dL — ABNORMAL LOW (ref 6.5–8.1)

## 2020-01-18 LAB — PATHOLOGIST SMEAR REVIEW

## 2020-01-18 LAB — TRANSFERRIN: Transferrin: <70 mg/dL — ABNORMAL LOW (ref 192–382)

## 2020-01-18 LAB — LEGIONELLA PNEUMOPHILA SEROGP 1 UR AG: L. pneumophila Serogp 1 Ur Ag: NEGATIVE

## 2020-01-18 LAB — URINE CULTURE: Culture: >=100000 — AB

## 2020-01-18 LAB — GLUCOSE, CAPILLARY
Glucose-Capillary: 89 mg/dL (ref 70–99)
Glucose-Capillary: 94 mg/dL (ref 70–99)
Glucose-Capillary: 147 mg/dL — ABNORMAL HIGH (ref 70–99)
Glucose-Capillary: 147 mg/dL — ABNORMAL HIGH (ref 70–99)
Glucose-Capillary: 120 mg/dL — ABNORMAL HIGH (ref 70–99)

## 2020-01-18 LAB — PREALBUMIN: Prealbumin: 5.7 mg/dL — ABNORMAL LOW (ref 18–38)

## 2020-01-18 MED ORDER — PEG-KCL-NACL-NASULF-NA ASC-C 100 G PO SOLR
0.5000 | Freq: Once | ORAL | Status: AC
  Administered 2020-01-18: 100 g via ORAL
  Filled 2020-01-18: qty 1

## 2020-01-18 MED ORDER — LACTATED RINGERS IV BOLUS
500.0000 mL | Freq: Once | INTRAVENOUS | Status: AC
  Administered 2020-01-18: 500 mL via INTRAVENOUS

## 2020-01-18 MED ORDER — ENSURE ENLIVE PO LIQD
237.0000 mL | Freq: Two times a day (BID) | ORAL | Status: DC
  Administered 2020-01-19 – 2020-01-21 (×4): 237 mL via ORAL

## 2020-01-18 MED ORDER — MIDODRINE HCL 5 MG PO TABS
10.0000 mg | ORAL_TABLET | Freq: Two times a day (BID) | ORAL | Status: DC
  Administered 2020-01-18 – 2020-01-21 (×5): 10 mg via ORAL
  Filled 2020-01-18 (×5): qty 2

## 2020-01-18 MED ORDER — PEG-KCL-NACL-NASULF-NA ASC-C 100 G PO SOLR: 1.0000 | Freq: Once | ORAL | Status: DC

## 2020-01-18 MED ORDER — METOCLOPRAMIDE HCL 5 MG/ML IJ SOLN
5.0000 mg | Freq: Once | INTRAMUSCULAR | Status: AC
  Administered 2020-01-18: 5 mg via INTRAVENOUS
  Filled 2020-01-18: qty 2

## 2020-01-18 NOTE — Progress Notes (Signed)
PROGRESS NOTE    Kerry Barnes  IRS:[id number] DOB: [birth year] DOA: 01/15/2020 PCP: Aletha Halim., PA-C  Brief Narrative: 86 year old female with PMHx of CKD III, atrial fibrillation on Eliquis, chronic diastolic heart failure, hypertension, hyperlipidemia, and diabetes mellitus presenting for worsening weakness, FTT noted to be hypotensive in the ED, admitted to ICU treated with IV fluids, blood pressure stabilized, CT abdomen pelvis noted lower lobe consolidation and changing caliber of the colon suggesting possible focal lesion -Transferred to [facility] service 8/4  Assessment & Plan:    Multifocal pneumonia -CT noted bilateral lower lobe consolidation -Currently on Levaquin day 3 -Blood cultures negative -SLP eval completed, no concern for aspiration -Urine Legionella antigen is negative and strep pneumo antigen -Wean O2  Metabolic encephalopathy -In the setting of pneumonia, gabapentin -Mental status is improved, gabapentin discontinued  Asymptomatic bacteriuria -No symptoms of UTI, urine culture with Klebsiella -On antibiotics for pneumonia as above which will cover this regardless  Weight loss, failure to thrive, severe hypoalbuminemia -Rule out malignancy, chronic liver disease -GI consult for colonoscopy -Right upper quadrant ultrasound noted abnormal liver echotexture?  Fatty liver, I wonder if she has cirrhosis -Ferritin is significantly elevated raising consideration for hemochromatosis, anemia workup as below  Iron deficiency anemia -Anemia panel with iron deficiency and significantly elevated ferritin -GI consulted, given IV iron x1 -Colonoscopy planned for tomorrow -Follow-up peripheral smear, hb electrophoresis, -LDH is normal  Hypotension -Mild, may be chronic,?  Hypovolemia, maps greater than 60 -Clinically do not suspect sepsis -Blood cultures are negative -Increase midodrine, evaluation for cirrhosis  Paroxysmal atrial fibrillation -In sinus  rhythm at this time, Eliquis on hold -Continue amiodarone   Chronic diastolic CHF -Echo notes diastolic dysfunction, preserved EF and moderate mitral regurgitation -Clinically euvolemic, monitor  Type 2 diabetes mellitus -Januvia on hold, hba1c is 5.1 -Sliding scale insulin for now  Sacral decubitus ulcer -Wound care consult  DVT prophylaxis: Eliquis on hold Code Status: Full code Family Communication: Spouse at bedside 8/4 Disposition Plan:  Status is: Inpatient  Remains inpatient appropriate because:Inpatient level of care appropriate due to severity of illness   Dispo: The patient is from: Home              Anticipated d/c is to: SNF              Anticipated d/c date is: 3 days              Patient currently is not medically stable to d/c.  Consultants:   PCCM transfer  Gastroenterology   Procedures:   Antimicrobials:  Levaquin  Subjective: -Feels a little better today, no events overnight, breathing better, mentation is clear  Objective: Vitals:   01/18/20 0444 01/18/20 0500 01/18/20 0651 01/18/20 0955  BP: (!) 93/42  (!) 99/43 (!) 105/47  Pulse: (!) 54  (!) 54 (!) 59  Resp: 15   18  Temp: 98.4 F (36.9 C)   (!) 97.5 F (36.4 C)  TempSrc:    Oral  SpO2: 96%   100%  Weight:  50.2 kg    Height:        Intake/Output Summary (Last 24 hours) at 01/18/2020 1421 Last data filed at 01/18/2020 0900 Gross per 24 hour  Intake 240 ml  Output 400 ml  Net -160 ml   Filed Weights   01/16/20 0542 01/18/20 0500  Weight: 46.7 kg 50.2 kg    Examination:  General exam: Chronically ill extremely cachectic female sitting up in bed,  awake alert, oriented to self and partly to place, flat affect HEENT: No JVD CVS: S1-S2, regular rate rhythm Lungs: Few basilar rales otherwise clear Abdomen: Soft, nontender, bowel sounds present Extremities: No edema Skin: No rashes on exposed skin Psychiatry: Flat affect    Data Reviewed:   CBC: Recent Labs  Lab  01/12/20 1619 01/15/20 1901 01/16/20 0517 01/17/20 0456 01/18/20 0325  WBC 13.1* 20.3* 17.3* 18.4* 16.9*  NEUTROABS  --  17.6* 15.4*  --   --   HGB 11.5* 10.5* 7.7* 7.8* 8.1*  HCT 35.1* 32.2* 24.1* 24.0* 24.5*  MCV 81.1 80.7 80.3 79.2* 79.5*  PLT 373 334 268 250 456   Basic Metabolic Panel: Recent Labs  Lab 01/15/20 1901 01/16/20 0946 01/16/20 1240 01/17/20 0456 01/18/20 0325  NA 133* 164* 133* 135 134*  K 4.2 3.2* 3.5 3.6 3.4*  CL 101 81* 106 108 106  CO2 23 12* 19* 19* 21*  GLUCOSE 101* 72 91 86 106*  BUN '22 12 14 11 13  ' CREATININE 1.18* 0.67 0.83 0.82 0.91  CALCIUM 8.3* <4.0* 7.3* 7.2* 7.1*  MG 1.9  --   --   --   --   PHOS 2.7  --   --   --   --    GFR: Estimated Creatinine Clearance: 35.3 mL/min (by C-G formula based on SCr of 0.91 mg/dL). Liver Function Tests: Recent Labs  Lab 01/15/20 1901 01/16/20 0946 01/17/20 0456 01/18/20 0325  AST 125* 68* 73* 69*  ALT 92* 53* 55* 51*  ALKPHOS 101 60 73 88  BILITOT 2.5* 1.5* 1.8* 1.4*  PROT 7.0 4.5* 4.7* 5.1*  ALBUMIN 1.9* 1.4* 1.4* 1.4*   No results for input(s): LIPASE, AMYLASE in the last 168 hours. Recent Labs  Lab 01/15/20 2237  AMMONIA 21   Coagulation Profile: Recent Labs  Lab 01/16/20 0946  INR 1.5*   Cardiac Enzymes: Recent Labs  Lab 01/15/20 2337  CKTOTAL 534*   BNP (last 3 results) No results for input(s): PROBNP in the last 8760 hours. HbA1C: Recent Labs    01/16/20 0518  HGBA1C 5.1   CBG: Recent Labs  Lab 01/17/20 2044 01/17/20 2353 01/18/20 0444 01/18/20 0718 01/18/20 1119  GLUCAP 94 87 89 94 147*   Lipid Profile: No results for input(s): CHOL, HDL, LDLCALC, TRIG, CHOLHDL, LDLDIRECT in the last 72 hours. Thyroid Function Tests: Recent Labs    01/16/20 0516  TSH 1.277   Anemia Panel: Recent Labs    01/15/20 2337 01/16/20 0516  VITAMINB12  --  491  FOLATE 6.5  --   FERRITIN  --  2,258*  TIBC  --  NOT CALCULATED  IRON  --  16*  RETICCTPCT 1.7  --    Urine  analysis:    Component Value Date/Time   COLORURINE AMBER (A) 01/15/2020 1955   APPEARANCEUR CLOUDY (A) 01/15/2020 1955   LABSPEC 1.024 01/15/2020 1955   PHURINE 5.0 01/15/2020 1955   GLUCOSEU NEGATIVE 01/15/2020 1955   HGBUR MODERATE (A) 01/15/2020 1955   BILIRUBINUR SMALL (A) 01/15/2020 [place] NEGATIVE 01/15/2020 1955   PROTEINUR 30 (A) 01/15/2020 1955   UROBILINOGEN 0.2 09/08/2010 1330   NITRITE NEGATIVE 01/15/2020 1955   LEUKOCYTESUR NEGATIVE 01/15/2020 1955   Sepsis Labs: '@LABRCNTIP' (procalcitonin:4,lacticidven:4)  ) Recent Results (from the past 240 hour(s))  SARS Coronavirus 2 by RT PCR (hospital order, performed in [place] hospital lab) Nasopharyngeal Nasopharyngeal Swab     Status: None   Collection Time: 01/15/20  7:00 PM   Specimen: Nasopharyngeal Swab  Result Value Ref Range Status   SARS Coronavirus 2 NEGATIVE NEGATIVE Final    Comment: (NOTE) SARS-CoV-2 target nucleic acids are NOT DETECTED.  The SARS-CoV-2 RNA is generally detectable in upper and lower respiratory specimens during the acute phase of infection. The lowest concentration of SARS-CoV-2 viral copies this assay can detect is 250 copies / mL. A negative result does not preclude SARS-CoV-2 infection and should not be used as the sole basis for treatment or other patient management decisions.  A negative result may occur with improper specimen collection / handling, submission of specimen other than nasopharyngeal swab, presence of viral mutation(s) within the areas targeted by this assay, and inadequate number of viral copies (<250 copies / mL). A negative result must be combined with clinical observations, patient history, and epidemiological information.  Fact Sheet for Patients:   StrictlyIdeas.no  Fact Sheet for Healthcare Providers: BankingDealers.co.za  This test is not yet approved or  cleared by the [place] FDA and has been  authorized for detection and/or diagnosis of SARS-CoV-2 by FDA under an Emergency Use Authorization (EUA).  This EUA will remain in effect (meaning this test can be used) for the duration of the COVID-19 declaration under Section 564(b)(1) of the Act, 21 U.S.C. section 360bbb-3(b)(1), unless the authorization is terminated or revoked sooner.  Performed at [facility], [place] [address], [place], [place] [address]   Culture, blood (Routine x 2)     Status: None (Preliminary result)   Collection Time: 01/15/20  7:01 PM   Specimen: BLOOD  Result Value Ref Range Status   Specimen Description BLOOD SITE NOT SPECIFIED  Final   Special Requests   Final    BOTTLES DRAWN AEROBIC AND ANAEROBIC Blood Culture adequate volume   Culture   Final    NO GROWTH 3 DAYS Performed at [facility], [address], [place], [place] [address]    Report Status PENDING  Incomplete  Culture, blood (Routine x 2)     Status: None (Preliminary result)   Collection Time: 01/15/20  7:48 PM   Specimen: BLOOD RIGHT FOREARM  Result Value Ref Range Status   Specimen Description BLOOD RIGHT FOREARM  Final   Special Requests   Final    BOTTLES DRAWN AEROBIC ONLY Blood Culture results may not be optimal due to an inadequate volume of blood received in culture bottles   Culture   Final    NO GROWTH 3 DAYS Performed at [facility], [place] [address], [place], [place] [address]    Report Status PENDING  Incomplete  Urine culture     Status: Abnormal   Collection Time: 01/15/20  7:55 PM   Specimen: Urine, Random  Result Value Ref Range Status   Specimen Description URINE, RANDOM  Final   Special Requests   Final    NONE Performed at [facility], [place] [address] [place], [address] [address]    Culture >=100,000 COLONIES/mL KLEBSIELLA PNEUMONIAE (A)  Final   Report Status 01/18/2020 FINAL  Final   Organism ID, Bacteria KLEBSIELLA PNEUMONIAE (A)  Final      Susceptibility   Klebsiella pneumoniae  - MIC*    AMPICILLIN RESISTANT Resistant     CEFAZOLIN <=4 SENSITIVE Sensitive     CEFTRIAXONE <=0.25 SENSITIVE Sensitive     CIPROFLOXACIN <=0.25 SENSITIVE Sensitive     GENTAMICIN <=1 SENSITIVE Sensitive     IMIPENEM <=0.25 SENSITIVE Sensitive  NITROFURANTOIN 32 SENSITIVE Sensitive     TRIMETH/SULFA <=20 SENSITIVE Sensitive     AMPICILLIN/SULBACTAM <=2 SENSITIVE Sensitive     PIP/TAZO <=4 SENSITIVE Sensitive     * >=100,000 COLONIES/mL KLEBSIELLA PNEUMONIAE  MRSA PCR Screening     Status: None   Collection Time: 01/16/20  5:41 AM   Specimen: Nasal Mucosa; Nasopharyngeal  Result Value Ref Range Status   MRSA by PCR NEGATIVE NEGATIVE Final    Comment:        The GeneXpert MRSA Assay (FDA approved for NASAL specimens only), is one component of a comprehensive MRSA colonization surveillance program. It is not intended to diagnose MRSA infection nor to guide or monitor treatment for MRSA infections. Performed at [facility], [place] [address], [place], [place] [address]          Radiology Studies: US Abdomen Limited RUQ  Result Date: 01/17/2020 CLINICAL DATA:  Abnormal LFTs prior cholecystectomy EXAM: ULTRASOUND ABDOMEN LIMITED RIGHT UPPER QUADRANT COMPARISON:  CT 01/15/2020 FINDINGS: Gallbladder: Surgically absent. Common bile duct: Diameter: 5 mm, less pronounced dilatation on comparison CT. Liver: Diffusely increased hepatic echogenicity with loss of definition of the portal triads and diminished posterior through transmission compatible with hepatic steatosis. No focal liver lesion identified. Portal vein is patent on color Doppler imaging with normal direction of blood flow towards the liver. Other: None. IMPRESSION: 1. Diffusely increased hepatic echogenicity most often compatible with hepatic steatosis. 2. Prior cholecystectomy. 3. No significant biliary ductal dilatation. Electronically Signed   By: Lovena Le [name]   On: 01/17/2020 22:26        Scheduled  Meds: . amiodarone  200 mg Oral BID  . atorvastatin  40 mg Oral Daily  . collagenase   Topical Daily  . docusate sodium  100 mg Oral BID  . feeding supplement (ENSURE ENLIVE)  237 mL Oral BID BM  . insulin aspart  0-9 Units Subcutaneous Q4H  . mouth rinse  15 mL Mouth Rinse BID  . metoCLOPramide (REGLAN) injection  5 mg Intravenous Once   Followed by  . metoCLOPramide (REGLAN) injection  5 mg Intravenous Once  . midodrine  10 mg Oral BID WC  . multivitamin with minerals  1 tablet Oral Daily  . peg 3350 powder  0.5 kit Oral Once   And  . peg 3350 powder  0.5 kit Oral Once  . sodium chloride flush  3 mL Intravenous Q12H   Continuous Infusions: . levofloxacin (LEVAQUIN) IV 500 mg (01/18/20 1323)     LOS: 2 days    Time spent: 47mn  PDomenic Polite MD [facility]  01/18/2020, 2:21 PM

## 2020-01-18 NOTE — Progress Notes (Signed)
Physical Therapy Treatment Patient Details Name: Kerry Barnes MRN: [id number] DOB: September 14, 1933 Today's Date: 01/18/2020    History of Present Illness Pt is an 86 y/o female with PMH significant for CKD stage III, Afib on apixaban, chronic diastolic heart failure, HTN, HLD, DM type II admitted 8/2 with shock despite IVF resuscitation.  Dx include acute toxic metabolic encephalopathy, elevated LFTs, concern for colonic lesion.     PT Comments    Pt remains very limited overall with mobility secondary to weakness. She continues to require heavy physical assistance for bed mobility and transfers. Continue to recommend further intensive therapy services at a SNF upon d/c. Pt would continue to benefit from skilled physical therapy services at this time while admitted and after d/c to address the below listed limitations in order to improve overall safety and independence with functional mobility.    Follow Up Recommendations  SNF;Supervision/Assistance - 24 hour     Equipment Recommendations  Other (comment) (defer to next venue of care)    Recommendations for Other Services       Precautions / Restrictions Precautions Precautions: Fall Restrictions Weight Bearing Restrictions: No    Mobility  Bed Mobility Overal bed mobility: Needs Assistance Bed Mobility: Supine to Sit     Supine to sit: Max assist     General bed mobility comments: cueing for sequencing and technique, use of bed pads to position pt's hips at EOB  Transfers Overall transfer level: Needs assistance   Transfers: Lateral/Scoot Transfers          Lateral/Scoot Transfers: Mod assist;+2 physical assistance;+2 safety/equipment General transfer comment: pt with very poor sitting balance and tolerance to sitting, requesting to lie back down in bed. Finally agreeable to transfer to chair. However, unable to tolerate full upright standing; therefore, lateral scoot transfer method was  utilized  Ambulation/Gait                 Marine scientist Rankin (Stroke Patients Only)       Balance Overall balance assessment: Needs assistance Sitting-balance support: Bilateral upper extremity supported;Feet supported Sitting balance-Leahy Scale: Poor   Postural control: Right lateral lean                                  Cognition Arousal/Alertness: Awake/alert Behavior During Therapy: Flat affect Overall Cognitive Status: No family/caregiver present to determine baseline cognitive functioning Area of Impairment: Memory;Following commands;Safety/judgement;Awareness;Problem solving                     Memory: Decreased short-term memory Following Commands: Follows one step commands inconsistently;Follows one step commands with increased time Safety/Judgement: Decreased awareness of safety;Decreased awareness of deficits Awareness: Intellectual Problem Solving: Slow processing;Decreased initiation;Difficulty sequencing;Requires verbal cues        Exercises      General Comments        Pertinent Vitals/Pain Pain Assessment: No/denies pain    Home Living                      Prior Function            PT Goals (current goals can now be found in the care plan section) Acute Rehab PT Goals PT Goal Formulation: Patient unable to participate in goal setting Time For Goal Achievement: 01/30/20 Potential to Achieve  Goals: Fair Progress towards PT goals: Progressing toward goals    Frequency    Min 2X/week      PT Plan Current plan remains appropriate    Co-evaluation              AM-PAC PT "6 Clicks" Mobility   Outcome Measure  Help needed turning from your back to your side while in a flat bed without using bedrails?: A Lot Help needed moving from lying on your back to sitting on the side of a flat bed without using bedrails?: A Lot Help needed moving to and  from a bed to a chair (including a wheelchair)?: Total Help needed standing up from a chair using your arms (e.g., wheelchair or bedside chair)?: Total Help needed to walk in hospital room?: Total Help needed climbing 3-5 steps with a railing? : Total 6 Click Score: 8    End of Session Equipment Utilized During Treatment: Gait belt Activity Tolerance: Patient limited by fatigue Patient left: in chair;with call bell/phone within reach;with chair alarm set Nurse Communication: Mobility status PT Visit Diagnosis: Unsteadiness on feet (R26.81);Muscle weakness (generalized) (M62.81);Other abnormalities of gait and mobility (R26.89)     Time: 1055-1110 PT Time Calculation (min) (ACUTE ONLY): 15 min  Charges:  $Therapeutic Activity: 8-22 mins                     Anastasio Champion, DPT  Acute Rehabilitation Services Pager [phone] Office [place] 01/18/2020, 1:21 PM

## 2020-01-18 NOTE — Progress Notes (Addendum)
Daily Rounding Note  01/18/2020, 11:56 AM  LOS: 2 days   SUBJECTIVE:   Chief complaint: anemia     C/o dizziness earlier this AM.  BP to 93/42, HR in 50s.  Currently BP is 104/47.    OBJECTIVE:         Vital signs in last 24 hours:    Temp:  [97.5 F (36.4 C)-99.5 F (37.5 C)] 97.5 F (36.4 C) (08/05 0955) Pulse Rate:  [54-61] 59 (08/05 0955) Resp:  [15-20] 18 (08/05 0955) BP: (93-105)/(40-48) 105/47 (08/05 0955) SpO2:  [95 %-100 %] 100 % (08/05 0955) Weight:  [50.2 kg] 50.2 kg (08/05 0500) Last BM Date: 01/16/20 (per pt) Filed Weights   01/16/20 0542 01/18/20 0500  Weight: 46.7 kg 50.2 kg   General: pleasant, alert, appropriate.  Sitting in chair eating lunch (solid food).     Heart: RRR Chest: crackles in R base, O/w clear.  No labored breathing Abdomen: soft, ND, NT.  Active BS  Extremities: No CCE.   Neuro/Psych:  Alert, appropriate.  Moves all 4 limbs.  No gross weakness of deficits.    Lab Results: Recent Labs    01/16/20 0517 01/17/20 0456 01/18/20 0325  WBC 17.3* 18.4* 16.9*  HGB 7.7* 7.8* 8.1*  HCT 24.1* 24.0* 24.5*  PLT 268 250 313   BMET Recent Labs    01/16/20 1240 01/17/20 0456 01/18/20 0325  NA 133* 135 134*  K 3.5 3.6 3.4*  CL 106 108 106  CO2 19* 19* 21*  GLUCOSE 91 86 106*  BUN 14 11 13   CREATININE 0.83 0.82 0.91  CALCIUM 7.3* 7.2* 7.1*   LFT Recent Labs    01/16/20 0946 01/17/20 0456 01/18/20 0325  PROT 4.5* 4.7* 5.1*  ALBUMIN 1.4* 1.4* 1.4*  AST 68* 73* 69*  ALT 53* 55* 51*  ALKPHOS 60 73 88  BILITOT 1.5* 1.8* 1.4*   PT/INR Recent Labs    01/16/20 0946  LABPROT 17.7*  INR 1.5*   Hepatitis Panel Recent Labs    01/15/20 2337  HEPBSAG NON REACTIVE  HCVAB NON REACTIVE  HEPAIGM NON REACTIVE  HEPBIGM NON REACTIVE    Studies/Results: US Abdomen Limited RUQ  Result Date: 01/17/2020 CLINICAL DATA:  Abnormal LFTs prior cholecystectomy EXAM: ULTRASOUND  ABDOMEN LIMITED RIGHT UPPER QUADRANT COMPARISON:  CT 01/15/2020 FINDINGS: Gallbladder: Surgically absent. Common bile duct: Diameter: 5 mm, less pronounced dilatation on comparison CT. Liver: Diffusely increased hepatic echogenicity with loss of definition of the portal triads and diminished posterior through transmission compatible with hepatic steatosis. No focal liver lesion identified. Portal vein is patent on color Doppler imaging with normal direction of blood flow towards the liver. Other: None. IMPRESSION: 1. Diffusely increased hepatic echogenicity most often compatible with hepatic steatosis. 2. Prior cholecystectomy. 3. No significant biliary ductal dilatation. Electronically Signed   By: Lovena Le [name]   On: 01/17/2020 22:26   Scheduled Meds: . amiodarone  200 mg Oral BID  . atorvastatin  40 mg Oral Daily  . bisacodyl  20 mg Oral Once  . collagenase   Topical Daily  . docusate sodium  100 mg Oral BID  . feeding supplement (ENSURE ENLIVE)  237 mL Oral BID BM  . insulin aspart  0-9 Units Subcutaneous Q4H  . mouth rinse  15 mL Mouth Rinse BID  . midodrine  10 mg Oral BID WC  . multivitamin with minerals  1 tablet Oral Daily  . sodium chloride  flush  3 mL Intravenous Q12H   Continuous Infusions: . levofloxacin (LEVAQUIN) IV 500 mg (01/16/20 1213)   PRN Meds:.albuterol, docusate sodium, ondansetron **OR** ondansetron (ZOFRAN) IV, polyethylene glycol   ASSESMENT:   *   FTT, unintentional wt loss, anorexia, loose stools.  Previous hx IBS-C.   CT suggests thickening of SB and colon, ? Enteritis.  Also change in conic caliber at splenic flexure.   01/2015 Colonoscopy w TA (#2) and lymphoid aggregate (#1) polyps.  No surveillance colonoscopy since then due to pt age.  hypoalbuminema may be cause of thickening seen on CT    *   Elevated LFTs: T bili and transaminases.  GB out.  Liver, biliary tree, pancreas normal on CT.  ? elemant of hepatic hypoperfusion in setting of hypotension?    LFTs improving.   Low albumin, suspect PCM.   *   Chronic eliquis for A fib.  On hold.  Unknown last dose but > 36 h ago.  Mild coagulopathy, INR 1.5.    *   Microcytic anemia.  Hgb 11.5 >> 7.8 I >> 8.1n last 6 days, suspect some of this is dilutional as pt received IV boluses in ED.  Hgb was 12 to 13 in 2019.  Iron low at 16, TIBC/Sat not calculated.  Ferritin 2258. Folate and B12 wnl.    feraheme infusion on 8/4.    *   Leukocytosis w/o fever.  CXR s/o pneumonia.   Klebsiella in urine path PCR.    Levaquin d 3.  Good O2 sats, no labored breathing  *   Hx dysphagia and Esophageal dysmotility.  No esophageal strictures but + response to empiric esoph dilatations in past.  Pt currently denies sxs s/o dysphagia.   SLP sees not need for swallow eval.    *   Hypokalemia  At 3.4.    *   Hyponatremia at 134.      PLAN   *   Colonoscopy set for 1215 tmrw PM.  See prep orders.      Kerry Barnes  01/18/2020, 11:56 AM Phone [phone]   ________________________________________________________________________  Velora Heckler GI MD note:  I personally examined the patient, reviewed the data and agree with the assessment and plan described above.   Owens Loffler, MD [facility] Gastroenterology Pager [phone]

## 2020-01-18 NOTE — Progress Notes (Signed)
SLP Cancellation Note  Patient Details Name: [name] MRN: [id number] DOB: [birth year]   Cancelled eval:        Communicated with Dr. Broadus John; repeat swallow assessment not warranted. Will sign off.    Juan Quam Laurice 01/18/2020, 11:10 AM

## 2020-01-18 NOTE — Progress Notes (Addendum)
Patient reports dizziness along with low BP this morning, MD notified, see new orders.

## 2020-01-19 ENCOUNTER — Encounter (HOSPITAL_COMMUNITY): Payer: Self-pay | Admitting: Pulmonary Disease

## 2020-01-19 ENCOUNTER — Inpatient Hospital Stay (HOSPITAL_COMMUNITY): Payer: HMO | Admitting: Anesthesiology

## 2020-01-19 ENCOUNTER — Encounter (HOSPITAL_COMMUNITY)
Admission: EM | Disposition: A | Discharge status: Skilled Nursing Facility | Payer: Self-pay | Source: Home / Self Care | Attending: Internal Medicine

## 2020-01-19 ENCOUNTER — Inpatient Hospital Stay (HOSPITAL_COMMUNITY): Payer: HMO

## 2020-01-19 DIAGNOSIS — R933 Abnormal findings on diagnostic imaging of other parts of digestive tract: Secondary | ICD-10-CM

## 2020-01-19 LAB — CBC
HCT: 25 % — ABNORMAL LOW (ref 36.0–46.0)
Hemoglobin: 8.4 g/dL — ABNORMAL LOW (ref 12.0–15.0)
MCH: 26.8 pg (ref 26.0–34.0)
MCHC: 33.6 g/dL (ref 30.0–36.0)
MCV: 79.6 fL — ABNORMAL LOW (ref 80.0–100.0)
Platelets: 285 10*3/uL (ref 150–400)
RBC: 3.14 MIL/uL — ABNORMAL LOW (ref 3.87–5.11)
RDW: 19.8 % — ABNORMAL HIGH (ref 11.5–15.5)
WBC: 21.8 10*3/uL — ABNORMAL HIGH (ref 4.0–10.5)
nRBC: 0 % (ref 0.0–0.2)

## 2020-01-19 LAB — URINALYSIS, ROUTINE W REFLEX MICROSCOPIC
Bilirubin Urine: NEGATIVE
Glucose, UA: NEGATIVE mg/dL
Ketones, ur: NEGATIVE mg/dL
Leukocytes,Ua: NEGATIVE
Nitrite: NEGATIVE
Protein, ur: 30 mg/dL — AB
Specific Gravity, Urine: 1.025 (ref 1.005–1.030)
pH: 5 (ref 5.0–8.0)

## 2020-01-19 LAB — COMPREHENSIVE METABOLIC PANEL
ALT: 48 U/L — ABNORMAL HIGH (ref 0–44)
AST: 64 U/L — ABNORMAL HIGH (ref 15–41)
Albumin: 1.5 g/dL — ABNORMAL LOW (ref 3.5–5.0)
Alkaline Phosphatase: 123 U/L (ref 38–126)
Anion gap: 10 (ref 5–15)
BUN: 17 mg/dL (ref 8–23)
CO2: 19 mmol/L — ABNORMAL LOW (ref 22–32)
Calcium: 7.7 mg/dL — ABNORMAL LOW (ref 8.9–10.3)
Chloride: 109 mmol/L (ref 98–111)
Creatinine, Ser: 1.12 mg/dL — ABNORMAL HIGH (ref 0.44–1.00)
GFR calc Af Amer: 53 mL/min — ABNORMAL LOW (ref >60)
GFR calc non Af Amer: 45 mL/min — ABNORMAL LOW (ref >60)
Glucose, Bld: 93 mg/dL (ref 70–99)
Potassium: 3 mmol/L — ABNORMAL LOW (ref 3.5–5.1)
Sodium: 138 mmol/L (ref 135–145)
Total Bilirubin: 1 mg/dL (ref 0.3–1.2)
Total Protein: 5.4 g/dL — ABNORMAL LOW (ref 6.5–8.1)

## 2020-01-19 LAB — GLUCOSE, CAPILLARY
Glucose-Capillary: 109 mg/dL — ABNORMAL HIGH (ref 70–99)
Glucose-Capillary: 128 mg/dL — ABNORMAL HIGH (ref 70–99)
Glucose-Capillary: 129 mg/dL — ABNORMAL HIGH (ref 70–99)
Glucose-Capillary: 136 mg/dL — ABNORMAL HIGH (ref 70–99)
Glucose-Capillary: 146 mg/dL — ABNORMAL HIGH (ref 70–99)
Glucose-Capillary: 84 mg/dL (ref 70–99)

## 2020-01-19 LAB — SOLUBLE TRANSFERRIN RECEPTOR: Transferrin Receptor: 24 nmol/L (ref 12.2–27.3)

## 2020-01-19 SURGERY — INVASIVE LAB ABORTED CASE
Anesthesia: Monitor Anesthesia Care

## 2020-01-19 MED ORDER — LACTATED RINGERS IV SOLN: INTRAVENOUS | Status: DC | PRN

## 2020-01-19 MED ORDER — PROPOFOL 500 MG/50ML IV EMUL
INTRAVENOUS | Status: DC | PRN
  Administered 2020-01-19: 125 ug/kg/min via INTRAVENOUS

## 2020-01-19 MED ORDER — PEG-KCL-NACL-NASULF-NA ASC-C 100 G PO SOLR: 1.0000 | Freq: Two times a day (BID) | ORAL | Status: DC

## 2020-01-19 MED ORDER — ACETAMINOPHEN 325 MG PO TABS
650.0000 mg | ORAL_TABLET | Freq: Four times a day (QID) | ORAL | Status: DC | PRN
  Administered 2020-01-19 – 2020-01-22 (×3): 650 mg via ORAL
  Filled 2020-01-19 (×3): qty 2

## 2020-01-19 MED ORDER — FUROSEMIDE 10 MG/ML IJ SOLN
20.0000 mg | Freq: Two times a day (BID) | INTRAMUSCULAR | Status: DC
  Administered 2020-01-19 – 2020-01-20 (×2): 20 mg via INTRAVENOUS
  Filled 2020-01-19 (×2): qty 2

## 2020-01-19 MED ORDER — LACTATED RINGERS IV SOLN
INTRAVENOUS | Status: AC | PRN
  Administered 2020-01-19: 1000 mL via INTRAVENOUS

## 2020-01-19 MED ORDER — POTASSIUM CHLORIDE CRYS ER 20 MEQ PO TBCR
40.0000 meq | EXTENDED_RELEASE_TABLET | Freq: Three times a day (TID) | ORAL | Status: DC
  Administered 2020-01-19 (×2): 40 meq via ORAL
  Filled 2020-01-19 (×2): qty 2

## 2020-01-19 MED ORDER — LIDOCAINE HCL (CARDIAC) PF 100 MG/5ML IV SOSY
PREFILLED_SYRINGE | INTRAVENOUS | Status: DC | PRN
  Administered 2020-01-19: 50 mg via INTRATRACHEAL

## 2020-01-19 MED ORDER — PEG-KCL-NACL-NASULF-NA ASC-C 100 G PO SOLR
0.5000 | Freq: Once | ORAL | Status: AC
  Administered 2020-01-19: 100 g via ORAL
  Filled 2020-01-19: qty 1

## 2020-01-19 MED ORDER — PEG-KCL-NACL-NASULF-NA ASC-C 100 G PO SOLR
0.5000 | Freq: Once | ORAL | Status: AC
  Administered 2020-01-20: 100 g via ORAL
  Filled 2020-01-19 (×2): qty 1

## 2020-01-19 SURGICAL SUPPLY — 22 items

## 2020-01-19 NOTE — Anesthesia Preprocedure Evaluation (Signed)
Anesthesia Evaluation  Patient identified by MRN, date of birth, ID band Patient awake    Reviewed: Allergy & Precautions, NPO status , Patient's Chart, lab work & pertinent test results  History of Anesthesia Complications Negative for: history of anesthetic complications  Airway Mallampati: II  TM Distance: >3 FB Neck ROM: Full    Dental   Pulmonary asthma ,  pulm HTN   Pulmonary exam normal        Cardiovascular hypertension, +CHF  Normal cardiovascular exam+ dysrhythmias Atrial Fibrillation      Neuro/Psych TIAnegative psych ROS   GI/Hepatic Neg liver ROS, GERD  ,  Endo/Other  diabetes, Type 2  Renal/GU Renal Insufficiency and ARFRenal disease  negative genitourinary   Musculoskeletal  (+) Arthritis , Fibromyalgia -  Abdominal   Peds  Hematology  (+) anemia ,   Anesthesia Other Findings   Reproductive/Obstetrics                             Anesthesia Physical Anesthesia Plan  ASA: III  Anesthesia Plan: MAC   Post-op Pain Management:    Induction: Intravenous  PONV Risk Score and Plan: 2 and Propofol infusion, TIVA and Treatment may vary due to age or medical condition  Airway Management Planned: Natural Airway, Nasal Cannula and Simple Face Mask  Additional Equipment: None  Intra-op Plan:   Post-operative Plan:   Informed Consent: I have reviewed the patients History and Physical, chart, labs and discussed the procedure including the risks, benefits and alternatives for the proposed anesthesia with the patient or authorized representative who has indicated his/her understanding and acceptance.       Plan Discussed with:   Anesthesia Plan Comments:         Anesthesia Quick Evaluation

## 2020-01-19 NOTE — Anesthesia Postprocedure Evaluation (Signed)
Anesthesia Post Note  Patient: Kerry Barnes  Procedure(s) Performed: INVASIVE LAB ABORTED CASE (N/A )     Patient location during evaluation: Endoscopy Anesthesia Type: MAC Level of consciousness: awake and alert Pain management: pain level controlled Vital Signs Assessment: post-procedure vital signs reviewed and stable Respiratory status: spontaneous breathing, nonlabored ventilation and respiratory function stable Cardiovascular status: blood pressure returned to baseline and stable Postop Assessment: no apparent nausea or vomiting Anesthetic complications: no   No complications documented.  Last Vitals:  Vitals:   01/19/20 1300 01/19/20 1311  BP: (!) 138/38 (!) 168/44  Pulse: (!) 53 75  Resp: 20 18  Temp:    SpO2: 100% 98%    Last Pain:  Vitals:   01/19/20 1311  TempSrc:   PainSc: 0-No pain                 [name]

## 2020-01-19 NOTE — H&P (View-Only) (Signed)
PROGRESS NOTE    Kerry Barnes  UXN:[id number] DOB: April 09, 1934 DOA: 01/15/2020 PCP: Aletha Halim., PA-C  Brief Narrative: 86 year old female with PMHx of CKD III, atrial fibrillation on Eliquis, chronic diastolic heart failure, hypertension, hyperlipidemia, and diabetes mellitus presenting for worsening weakness, FTT noted to be hypotensive in the ED, admitted to ICU treated with IV fluids, Abx, blood pressure stabilized, CT abdomen pelvis noted lower lobe consolidation and changing caliber of the colon suggesting possible focal lesion -Transferred to [facility] service 8/4 -Gi consulted, colonoscopy planned, continues to have failure to thrive and complications from severe hypoalbuminemia  Assessment & Plan:    Multifocal pneumonia -CT noted bilateral lower lobe consolidation -Currently on Levaquin day 4/7 -Blood cultures negative -SLP eval completed, no concern for aspiration -Urine Legionella and strep pneumo antigen are negative  -Wean O2 -Worsening leukocytosis will check chest x-ray, bladder scan and urinalysis  Metabolic encephalopathy -In the setting of pneumonia, gabapentin -Mental status was improving, gabapentin discontinued yesterday -Slightly more lethargic today with worsening leukocytosis repeat chest x-ray, urinalysis, bladder scan with urinary retention -Ammonia level was normal  Asymptomatic bacteriuria -Initially had no symptoms of UTI, urine culture with Klebsiella -On antibiotics for pneumonia as above which will cover this -Given worsening leukocytosis obtain repeat urinalysis and culture  Weight loss, failure to thrive, severe hypoalbuminemia -Rule out malignancy, chronic liver disease -GI consulting, plan for colonoscopy -Right upper quadrant ultrasound noted abnormal liver echotexture?  Fatty liver, I wonder if she has cirrhosis, elastography not done on inpatient ultrasounds, HCV negative -Ferritin is significantly elevated raising consideration for  hemochromatosis, anemia workup as below -TSH is unremarkable  Iron deficiency anemia -Anemia panel with iron deficiency and significantly elevated ferritin -GI consulted, given IV iron x1 -Colonoscopy planned for tomorrow -Peripheral smear noted microcytic anemia, hemoglobin electrophoresis is unremarkable -LDH is normal  Hypotension -Mild, may be chronic,?  Hypovolemia, maps greater than 60 -Clinically do not suspect sepsis -Blood cultures are negative -Started on midodrine, evaluation for cirrhosis  Paroxysmal atrial fibrillation -In sinus rhythm at this time, Eliquis on hold -Continue amiodarone   Acute on chronic diastolic CHF -Echo notes diastolic dysfunction, preserved EF and moderate mitral regurgitation -Appears slightly fluid overloaded -Lasix x 2 doses today  Type 2 diabetes mellitus -Januvia on hold, hba1c is 5.1 -Sliding scale insulin for now  Sacral decubitus ulcer -Wound care consult  DVT prophylaxis: Eliquis on hold Code Status: Full code Family Communication: Spouse at bedside  Disposition Plan:  Status is: Inpatient  Remains inpatient appropriate because:Inpatient level of care appropriate due to severity of illness   Dispo: The patient is from: Home              Anticipated d/c is to: SNF              Anticipated d/c date is: Pending above work-up              Patient currently is not medically stable to d/c.  Consultants:   PCCM transfer  Gastroenterology   Procedures:   Antimicrobials:  Levaquin  Subjective: -Slightly more sleepy and tired today, reportedly did not drink much prep yesterday  Objective: Vitals:   01/19/20 1149 01/19/20 1250 01/19/20 1300 01/19/20 1311  BP: (!) 106/36 (!) 127/38 (!) 138/38 (!) 168/44  Pulse: (!) 57 (!) 53 (!) 53 75  Resp: _0 Temp: 98.2 F (36.8 C) 98.1 F (36.7 C)    TempSrc: Temporal Temporal    SpO2:  94% 100% 100% 98%  Weight:      Height:        Intake/Output Summary (Last 24  hours) at 01/19/2020 1526 Last data filed at 01/19/2020 1441 Gross per 24 hour  Intake 1560.32 ml  Output 1125 ml  Net 435.32 ml   Filed Weights   01/18/20 0500 01/18/20 2029 01/19/20 0500  Weight: 50.2 kg 50.2 kg 51 kg    Examination:  General exam: Chronically ill extremely cachectic elderly female sitting up in bed awake alert oriented to self, place, flat affect HEENT: Positive JVD CVS: S1-S2, regular rate rhythm Lungs: Few basilar rales, otherwise clear Abdomen: Soft, nontender, bowel sounds present Extremities: 1+ edema Skin: No rashes on exposed skin Psychiatry: Flat affect    Data Reviewed:   CBC: Recent Labs  Lab 01/15/20 1901 01/16/20 0517 01/17/20 0456 01/18/20 0325 01/19/20 0753  WBC 20.3* 17.3* 18.4* 16.9* 21.8*  NEUTROABS 17.6* 15.4*  --   --   --   HGB 10.5* 7.7* 7.8* 8.1* 8.4*  HCT 32.2* 24.1* 24.0* 24.5* 25.0*  MCV 80.7 80.3 79.2* 79.5* 79.6*  PLT 334 268 250 313 285   Basic Metabolic Panel: Recent Labs  Lab 01/15/20 1901 01/15/20 1901 01/16/20 0946 01/16/20 1240 01/17/20 0456 01/18/20 0325 01/19/20 0753  NA 133*   < > 164* 133* 135 134* 138  K 4.2   < > 3.2* 3.5 3.6 3.4* 3.0*  CL 101   < > 81* 106 108 106 109  CO2 23   < > 12* 19* 19* 21* 19*  GLUCOSE 101*   < > 72 91 86 106* 93  BUN 22   < > 12 14 11 13 17  CREATININE 1.18*   < > 0.67 0.83 0.82 0.91 1.12*  CALCIUM 8.3*   < > <4.0* 7.3* 7.2* 7.1* 7.7*  MG 1.9  --   --   --   --   --   --   PHOS 2.7  --   --   --   --   --   --    < > = values in this interval not displayed.   GFR: Estimated Creatinine Clearance: 28.7 mL/min (A) (by C-G formula based on SCr of 1.12 mg/dL (H)). Liver Function Tests: Recent Labs  Lab 01/15/20 1901 01/16/20 0946 01/17/20 0456 01/18/20 0325 01/19/20 0753  AST 125* 68* 73* 69* 64*  ALT 92* 53* 55* 51* 48*  ALKPHOS 101 60 73 88 123  BILITOT 2.5* 1.5* 1.8* 1.4* 1.0  PROT 7.0 4.5* 4.7* 5.1* 5.4*  ALBUMIN 1.9* 1.4* 1.4* 1.4* 1.5*   No results for  input(s): LIPASE, AMYLASE in the last 168 hours. Recent Labs  Lab 01/15/20 2237  AMMONIA 21   Coagulation Profile: Recent Labs  Lab 01/16/20 0946  INR 1.5*   Cardiac Enzymes: Recent Labs  Lab 01/15/20 2337  CKTOTAL 534*   BNP (last 3 results) No results for input(s): PROBNP in the last 8760 hours. HbA1C: No results for input(s): HGBA1C in the last 72 hours. CBG: Recent Labs  Lab 01/18/20 2029 01/19/20 0013 01/19/20 0441 01/19/20 0757 01/19/20 1132  GLUCAP 120* 128* 129* 84 109*   Lipid Profile: No results for input(s): CHOL, HDL, LDLCALC, TRIG, CHOLHDL, LDLDIRECT in the last 72 hours. Thyroid Function Tests: No results for input(s): TSH, T4TOTAL, FREET4, T3FREE, THYROIDAB in the last 72 hours. Anemia Panel: No results for input(s): VITAMINB12, FOLATE, FERRITIN, TIBC, IRON, RETICCTPCT in the last 72 hours. Urine analysis:      Component Value Date/Time   COLORURINE AMBER (A) 01/19/2020 0912   APPEARANCEUR CLEAR 01/19/2020 0912   LABSPEC 1.025 01/19/2020 0912   PHURINE 5.0 01/19/2020 0912   GLUCOSEU NEGATIVE 01/19/2020 0912   HGBUR MODERATE (A) 01/19/2020 0912   BILIRUBINUR NEGATIVE 01/19/2020 0912   KETONESUR NEGATIVE 01/19/2020 0912   PROTEINUR 30 (A) 01/19/2020 0912   UROBILINOGEN 0.2 09/08/2010 1330   NITRITE NEGATIVE 01/19/2020 0912   LEUKOCYTESUR NEGATIVE 01/19/2020 0912   Sepsis Labs: @LABRCNTIP(procalcitonin:4,lacticidven:4)  ) Recent Results (from the past 240 hour(s))  SARS Coronavirus 2 by RT PCR (hospital order, performed in [facility] hospital lab) Nasopharyngeal Nasopharyngeal Swab     Status: None   Collection Time: 01/15/20  7:00 PM   Specimen: Nasopharyngeal Swab  Result Value Ref Range Status   SARS Coronavirus 2 NEGATIVE NEGATIVE Final    Comment: (NOTE) SARS-CoV-2 target nucleic acids are NOT DETECTED.  The SARS-CoV-2 RNA is generally detectable in upper and lower respiratory specimens during the acute phase of infection. The  lowest concentration of SARS-CoV-2 viral copies this assay can detect is 250 copies / mL. A negative result does not preclude SARS-CoV-2 infection and should not be used as the sole basis for treatment or other patient management decisions.  A negative result may occur with improper specimen collection / handling, submission of specimen other than nasopharyngeal swab, presence of viral mutation(s) within the areas targeted by this assay, and inadequate number of viral copies (<250 copies / mL). A negative result must be combined with clinical observations, patient history, and epidemiological information.  Fact Sheet for Patients:   https://www.fda.gov/media/136312/download  Fact Sheet for Healthcare Providers: https://www.fda.gov/media/136313/download  This test is not yet approved or  cleared by the [place] FDA and has been authorized for detection and/or diagnosis of SARS-CoV-2 by FDA under an Emergency Use Authorization (EUA).  This EUA will remain in effect (meaning this test can be used) for the duration of the COVID-19 declaration under Section 564(b)(1) of the Act, 21 U.S.C. section 360bbb-3(b)(1), unless the authorization is terminated or revoked sooner.  Performed at [facility], [address] [place], [place] [address]   Culture, blood (Routine x 2)     Status: None (Preliminary result)   Collection Time: 01/15/20  7:01 PM   Specimen: BLOOD  Result Value Ref Range Status   Specimen Description BLOOD SITE NOT SPECIFIED  Final   Special Requests   Final    BOTTLES DRAWN AEROBIC AND ANAEROBIC Blood Culture adequate volume   Culture   Final    NO GROWTH 4 DAYS Performed at [facility], [address] [place], [place] [address]    Report Status PENDING  Incomplete  Culture, blood (Routine x 2)     Status: None (Preliminary result)   Collection Time: 01/15/20  7:48 PM   Specimen: BLOOD RIGHT FOREARM  Result Value Ref Range Status   Specimen  Description BLOOD RIGHT FOREARM  Final   Special Requests   Final    BOTTLES DRAWN AEROBIC ONLY Blood Culture results may not be optimal due to an inadequate volume of blood received in culture bottles   Culture   Final    NO GROWTH 4 DAYS Performed at [facility], [address] , [place] [address]    Report Status PENDING  Incomplete  Urine culture     Status: Abnormal   Collection Time: 01/15/20  7:55 PM   Specimen: Urine, Random  Result Value   Ref Range Status   Specimen Description URINE, RANDOM  Final   Special Requests   Final    NONE Performed at [facility], [address] [place], [place] [address]    Culture >=100,000 COLONIES/mL KLEBSIELLA PNEUMONIAE (A)  Final   Report Status 01/18/2020 FINAL  Final   Organism ID, Bacteria KLEBSIELLA PNEUMONIAE (A)  Final      Susceptibility   Klebsiella pneumoniae - MIC*    AMPICILLIN RESISTANT Resistant     CEFAZOLIN <=4 SENSITIVE Sensitive     CEFTRIAXONE <=0.25 SENSITIVE Sensitive     CIPROFLOXACIN <=0.25 SENSITIVE Sensitive     GENTAMICIN <=1 SENSITIVE Sensitive     IMIPENEM <=0.25 SENSITIVE Sensitive     NITROFURANTOIN 32 SENSITIVE Sensitive     TRIMETH/SULFA <=20 SENSITIVE Sensitive     AMPICILLIN/SULBACTAM <=2 SENSITIVE Sensitive     PIP/TAZO <=4 SENSITIVE Sensitive     * >=100,000 COLONIES/mL KLEBSIELLA PNEUMONIAE  MRSA PCR Screening     Status: None   Collection Time: 01/16/20  5:41 AM   Specimen: Nasal Mucosa; Nasopharyngeal  Result Value Ref Range Status   MRSA by PCR NEGATIVE NEGATIVE Final    Comment:        The GeneXpert MRSA Assay (FDA approved for NASAL specimens only), is one component of a comprehensive MRSA colonization surveillance program. It is not intended to diagnose MRSA infection nor to guide or monitor treatment for MRSA infections. Performed at [facility], [address] [place], [place] [address]          Radiology Studies: DG Chest 2 View  Result  Date: 01/19/2020 CLINICAL DATA:  85-year-old female with shortness of breath. Leukocytosis. EXAM: CHEST - 2 VIEW COMPARISON:  Chest radiographs 01/15/2020 and earlier. FINDINGS: AP and lateral views of the chest. Stable mild cardiomegaly. Visualized tracheal air column is within normal limits. Increased small bilateral pleural effusions, layering posteriorly on the lateral. Continued streaky additional bilateral lower lobe opacity. Although pulmonary vascularity appears only mildly increased since 2019. No air bronchograms. No pneumothorax. No acute osseous abnormality identified. Increased gas distended bowel loops in the upper abdomen. IMPRESSION: 1. Small but increased bilateral pleural effusions from recent radiographs. Streaky additional bilateral lower lobe opacity with some increased vascular congestion compared to 2019. Differential considerations include mild interstitial edema, pleural effusions with atelectasis, or infection. 2. Increased gas distended bowel in the visible abdomen since 01/15/2020. Electronically Signed   By: H  Hall [name]   On: 01/19/2020 10:27   US Abdomen Limited RUQ  Result Date: 01/17/2020 CLINICAL DATA:  Abnormal LFTs prior cholecystectomy EXAM: ULTRASOUND ABDOMEN LIMITED RIGHT UPPER QUADRANT COMPARISON:  CT 01/15/2020 FINDINGS: Gallbladder: Surgically absent. Common bile duct: Diameter: 5 mm, less pronounced dilatation on comparison CT. Liver: Diffusely increased hepatic echogenicity with loss of definition of the portal triads and diminished posterior through transmission compatible with hepatic steatosis. No focal liver lesion identified. Portal vein is patent on color Doppler imaging with normal direction of blood flow towards the liver. Other: None. IMPRESSION: 1. Diffusely increased hepatic echogenicity most often compatible with hepatic steatosis. 2. Prior cholecystectomy. 3. No significant biliary ductal dilatation. Electronically Signed   By: Price  DeHay [name]   On:  01/17/2020 22:26        Scheduled Meds: . amiodarone  200 mg Oral BID  . atorvastatin  40 mg Oral Daily  . collagenase   Topical Daily  . docusate sodium  100 mg Oral BID  . feeding supplement (  ENSURE ENLIVE)  237 mL Oral BID BM  . furosemide  20 mg Intravenous BID  . insulin aspart  0-9 Units Subcutaneous Q4H  . mouth rinse  15 mL Mouth Rinse BID  . midodrine  10 mg Oral BID WC  . multivitamin with minerals  1 tablet Oral Daily  . peg 3350 powder  0.5 kit Oral Once   And  . [START ON 01/20/2020] peg 3350 powder  0.5 kit Oral Once  . potassium chloride  40 mEq Oral TID  . sodium chloride flush  3 mL Intravenous Q12H   Continuous Infusions: . levofloxacin (LEVAQUIN) IV Stopped (01/18/20 1435)     LOS: 3 days    Time spent: 41mn  PDomenic Polite MD [facility]  01/19/2020, 3:26 PM

## 2020-01-19 NOTE — Plan of Care (Signed)
  Problem: Clinical Measurements: Goal: Will remain free from infection Outcome: Progressing   Problem: Clinical Measurements: Goal: Diagnostic test results will improve Outcome: Progressing   Problem: Coping: Goal: Level of anxiety will decrease Outcome: Progressing

## 2020-01-19 NOTE — Progress Notes (Signed)
   01/19/20 2007  Assess: MEWS Score  Temp (!) 100.5 F (38.1 C)  BP 111/60  Pulse Rate (!) 109  Resp 20  SpO2 92 %  O2 Device Room Air  Assess: MEWS Score  MEWS Temp 1  MEWS Systolic 0  MEWS Pulse 1  MEWS RR 0  MEWS LOC 0  MEWS Score 2  MEWS Score Color Yellow  Assess: if the MEWS score is Yellow or Red  Were vital signs taken at a resting state? Yes  Focused Assessment No change from prior assessment  Early Detection of Sepsis Score *See Row Information* Low  MEWS guidelines implemented *See Row Information* Yes  Treat  MEWS Interventions Other (Comment)  Take Vital Signs  Increase Vital Sign Frequency  Yellow: Q 2hr X 2 then Q 4hr X 2, if remains yellow, continue Q 4hrs  Escalate  MEWS: Escalate Yellow: discuss with charge nurse/RN and consider discussing with provider and RRT  Notify: Charge Nurse/RN  Name of Charge Nurse/RN Notified Emmanuel RN  Date Charge Nurse/RN Notified 01/19/20  Time Charge Nurse/RN Notified 2100  Notify: Provider  Provider Name/Title Dr. Ninetta Lights  Date Provider Notified 01/19/20  Time Provider Notified 2100  Notification Type Call  Notification Reason Other (Comment) (no prn meds ordered for fever)  Response See new orders  Date of Provider Response 01/19/20  Time of Provider Response 2100  Document  Patient Outcome Stabilized after interventions

## 2020-01-19 NOTE — Interval H&P Note (Signed)
History and Physical Interval Note:  01/19/2020 12:07 PM  Kerry Barnes  has presented today for surgery, with the diagnosis of anemia.  ? mass at splenic flexure per CT.  The various methods of treatment have been discussed with the patient and family. After consideration of risks, benefits and other options for treatment, the patient has consented to  Procedure(s): COLONOSCOPY WITH PROPOFOL (N/A) as a surgical intervention.  The patient's history has been reviewed, patient examined, no change in status, stable for surgery.  I have reviewed the patient's chart and labs.  Questions were answered to the patient's satisfaction.     Milus Banister

## 2020-01-19 NOTE — Progress Notes (Addendum)
PROGRESS NOTE    Kerry Barnes  MRN:[id number] DOB: 02/09/1934 DOA: 01/15/2020 PCP: Kaplan, Kristen W., PA-C  Brief Narrative: 85 year old female with PMHx of CKD III, atrial fibrillation on Eliquis, chronic diastolic heart failure, hypertension, hyperlipidemia, and diabetes mellitus presenting for worsening weakness, FTT noted to be hypotensive in the ED, admitted to ICU treated with IV fluids, Abx, blood pressure stabilized, CT abdomen pelvis noted lower lobe consolidation and changing caliber of the colon suggesting possible focal lesion -Transferred to TR[facility] service 8/4 -Gi consulted, colonoscopy planned, continues to have failure to thrive and complications from severe hypoalbuminemia  Assessment & Plan:    Multifocal pneumonia -CT noted bilateral lower lobe consolidation -Currently on Levaquin day 4/7 -Blood cultures negative -SLP eval completed, no concern for aspiration -Urine Legionella and strep pneumo antigen are negative  -Wean O2 -Worsening leukocytosis will check chest x-ray, bladder scan and urinalysis  Metabolic encephalopathy -In the setting of pneumonia, gabapentin -Mental status was improving, gabapentin discontinued yesterday -Slightly more lethargic today with worsening leukocytosis repeat chest x-ray, urinalysis, bladder scan with urinary retention -Ammonia level was normal  Asymptomatic bacteriuria -Initially had no symptoms of UTI, urine culture with Klebsiella -On antibiotics for pneumonia as above which will cover this -Given worsening leukocytosis obtain repeat urinalysis and culture  Weight loss, failure to thrive, severe hypoalbuminemia -Rule out malignancy, chronic liver disease -GI consulting, plan for colonoscopy -Right upper quadrant ultrasound noted abnormal liver echotexture?  Fatty liver, I wonder if she has cirrhosis, elastography not done on inpatient ultrasounds, HCV negative -Ferritin is significantly elevated raising consideration for  hemochromatosis, anemia workup as below -TSH is unremarkable  Iron deficiency anemia -Anemia panel with iron deficiency and significantly elevated ferritin -GI consulted, given IV iron x1 -Colonoscopy planned for tomorrow -Peripheral smear noted microcytic anemia, hemoglobin electrophoresis is unremarkable -LDH is normal  Hypotension -Mild, may be chronic,?  Hypovolemia, maps greater than 60 -Clinically do not suspect sepsis -Blood cultures are negative -Started on midodrine, evaluation for cirrhosis  Paroxysmal atrial fibrillation -In sinus rhythm at this time, Eliquis on hold -Continue amiodarone   Acute on chronic diastolic CHF -Echo notes diastolic dysfunction, preserved EF and moderate mitral regurgitation -Appears slightly fluid overloaded -Lasix x 2 doses today  Type 2 diabetes mellitus -Januvia on hold, hba1c is 5.1 -Sliding scale insulin for now  Sacral decubitus ulcer -Wound care consult  DVT prophylaxis: Eliquis on hold Code Status: Full code Family Communication: Spouse at bedside  Disposition Plan:  Status is: Inpatient  Remains inpatient appropriate because:Inpatient level of care appropriate due to severity of illness   Dispo: The patient is from: Home              Anticipated d/c is to: SNF              Anticipated d/c date is: Pending above work-up              Patient currently is not medically stable to d/c.  Consultants:   PCCM transfer  Gastroenterology   Procedures:   Antimicrobials:  Levaquin  Subjective: -Slightly more sleepy and tired today, reportedly did not drink much prep yesterday  Objective: Vitals:   01/19/20 1149 01/19/20 1250 01/19/20 1300 01/19/20 1311  BP: (!) 106/36 (!) 127/38 (!) 138/38 (!) 168/44  Pulse: (!) 57 (!) 53 (!) 53 75  Resp: 19 20 20 18  Temp: 98.2 F (36.8 C) 98.1 F (36.7 C)    TempSrc: Temporal Temporal    SpO2:   94% 100% 100% 98%  Weight:      Height:        Intake/Output Summary (Last 24  hours) at 01/19/2020 1526 Last data filed at 01/19/2020 1441 Gross per 24 hour  Intake 1560.32 ml  Output 1125 ml  Net 435.32 ml   Filed Weights   01/18/20 0500 01/18/20 2029 01/19/20 0500  Weight: 50.2 kg 50.2 kg 51 kg    Examination:  General exam: Chronically ill extremely cachectic elderly female sitting up in bed awake alert oriented to self, place, flat affect HEENT: Positive JVD CVS: S1-S2, regular rate rhythm Lungs: Few basilar rales, otherwise clear Abdomen: Soft, nontender, bowel sounds present Extremities: 1+ edema Skin: No rashes on exposed skin Psychiatry: Flat affect    Data Reviewed:   CBC: Recent Labs  Lab 01/15/20 1901 01/16/20 0517 01/17/20 0456 01/18/20 0325 01/19/20 0753  WBC 20.3* 17.3* 18.4* 16.9* 21.8*  NEUTROABS 17.6* 15.4*  --   --   --   HGB 10.5* 7.7* 7.8* 8.1* 8.4*  HCT 32.2* 24.1* 24.0* 24.5* 25.0*  MCV 80.7 80.3 79.2* 79.5* 79.6*  PLT 334 268 250 313 285   Basic Metabolic Panel: Recent Labs  Lab 01/15/20 1901 01/15/20 1901 01/16/20 0946 01/16/20 1240 01/17/20 0456 01/18/20 0325 01/19/20 0753  NA 133*   < > 164* 133* 135 134* 138  K 4.2   < > 3.2* 3.5 3.6 3.4* 3.0*  CL 101   < > 81* 106 108 106 109  CO2 23   < > 12* 19* 19* 21* 19*  GLUCOSE 101*   < > 72 91 86 106* 93  BUN 22   < > 12 14 11 13 17  CREATININE 1.18*   < > 0.67 0.83 0.82 0.91 1.12*  CALCIUM 8.3*   < > <4.0* 7.3* 7.2* 7.1* 7.7*  MG 1.9  --   --   --   --   --   --   PHOS 2.7  --   --   --   --   --   --    < > = values in this interval not displayed.   GFR: Estimated Creatinine Clearance: 28.7 mL/min (A) (by C-G formula based on SCr of 1.12 mg/dL (H)). Liver Function Tests: Recent Labs  Lab 01/15/20 1901 01/16/20 0946 01/17/20 0456 01/18/20 0325 01/19/20 0753  AST 125* 68* 73* 69* 64*  ALT 92* 53* 55* 51* 48*  ALKPHOS 101 60 73 88 123  BILITOT 2.5* 1.5* 1.8* 1.4* 1.0  PROT 7.0 4.5* 4.7* 5.1* 5.4*  ALBUMIN 1.9* 1.4* 1.4* 1.4* 1.5*   No results for  input(s): LIPASE, AMYLASE in the last 168 hours. Recent Labs  Lab 01/15/20 2237  AMMONIA 21   Coagulation Profile: Recent Labs  Lab 01/16/20 0946  INR 1.5*   Cardiac Enzymes: Recent Labs  Lab 01/15/20 2337  CKTOTAL 534*   BNP (last 3 results) No results for input(s): PROBNP in the last 8760 hours. HbA1C: No results for input(s): HGBA1C in the last 72 hours. CBG: Recent Labs  Lab 01/18/20 2029 01/19/20 0013 01/19/20 0441 01/19/20 0757 01/19/20 1132  GLUCAP 120* 128* 129* 84 109*   Lipid Profile: No results for input(s): CHOL, HDL, LDLCALC, TRIG, CHOLHDL, LDLDIRECT in the last 72 hours. Thyroid Function Tests: No results for input(s): TSH, T4TOTAL, FREET4, T3FREE, THYROIDAB in the last 72 hours. Anemia Panel: No results for input(s): VITAMINB12, FOLATE, FERRITIN, TIBC, IRON, RETICCTPCT in the last 72 hours. Urine analysis:      Component Value Date/Time   COLORURINE AMBER (A) 01/19/2020 0912   APPEARANCEUR CLEAR 01/19/2020 0912   LABSPEC 1.025 01/19/2020 0912   PHURINE 5.0 01/19/2020 0912   GLUCOSEU NEGATIVE 01/19/2020 0912   HGBUR MODERATE (A) 01/19/2020 0912   BILIRUBINUR NEGATIVE 01/19/2020 0912   KETONESUR NEGATIVE 01/19/2020 0912   PROTEINUR 30 (A) 01/19/2020 0912   UROBILINOGEN 0.2 09/08/2010 1330   NITRITE NEGATIVE 01/19/2020 0912   LEUKOCYTESUR NEGATIVE 01/19/2020 0912   Sepsis Labs: @LABRCNTIP(procalcitonin:4,lacticidven:4)  ) Recent Results (from the past 240 hour(s))  SARS Coronavirus 2 by RT PCR (hospital order, performed in [place] hospital lab) Nasopharyngeal Nasopharyngeal Swab     Status: None   Collection Time: 01/15/20  7:00 PM   Specimen: Nasopharyngeal Swab  Result Value Ref Range Status   SARS Coronavirus 2 NEGATIVE NEGATIVE Final    Comment: (NOTE) SARS-CoV-2 target nucleic acids are NOT DETECTED.  The SARS-CoV-2 RNA is generally detectable in upper and lower respiratory specimens during the acute phase of infection. The  lowest concentration of SARS-CoV-2 viral copies this assay can detect is 250 copies / mL. A negative result does not preclude SARS-CoV-2 infection and should not be used as the sole basis for treatment or other patient management decisions.  A negative result may occur with improper specimen collection / handling, submission of specimen other than nasopharyngeal swab, presence of viral mutation(s) within the areas targeted by this assay, and inadequate number of viral copies (<250 copies / mL). A negative result must be combined with clinical observations, patient history, and epidemiological information.  Fact Sheet for Patients:   https://www.fda.gov/media/136312/download  Fact Sheet for Healthcare Providers: https://www.fda.gov/media/136313/download  This test is not yet approved or  cleared by the [place] FDA and has been authorized for detection and/or diagnosis of SARS-CoV-2 by FDA under an Emergency Use Authorization (EUA).  This EUA will remain in effect (meaning this test can be used) for the duration of the COVID-19 declaration under Section 564(b)(1) of the Act, 21 U.S.C. section 360bbb-3(b)(1), unless the authorization is terminated or revoked sooner.  Performed at [facility], [address] [place], [place] [address]   Culture, blood (Routine x 2)     Status: None (Preliminary result)   Collection Time: 01/15/20  7:01 PM   Specimen: BLOOD  Result Value Ref Range Status   Specimen Description BLOOD SITE NOT SPECIFIED  Final   Special Requests   Final    BOTTLES DRAWN AEROBIC AND ANAEROBIC Blood Culture adequate volume   Culture   Final    NO GROWTH 4 DAYS Performed at [facility], [address] [place], [place] [address]    Report Status PENDING  Incomplete  Culture, blood (Routine x 2)     Status: None (Preliminary result)   Collection Time: 01/15/20  7:48 PM   Specimen: BLOOD RIGHT FOREARM  Result Value Ref Range Status   Specimen  Description BLOOD RIGHT FOREARM  Final   Special Requests   Final    BOTTLES DRAWN AEROBIC ONLY Blood Culture results may not be optimal due to an inadequate volume of blood received in culture bottles   Culture   Final    NO GROWTH 4 DAYS Performed at [facility], [address] [place], [place] [address]    Report Status PENDING  Incomplete  Urine culture     Status: Abnormal   Collection Time: 01/15/20  7:55 PM   Specimen: Urine, Random  Result Value   Ref Range Status   Specimen Description URINE, RANDOM  Final   Special Requests   Final    NONE Performed at [facility], [address] [place], [place] [address]    Culture >=100,000 COLONIES/mL KLEBSIELLA PNEUMONIAE (A)  Final   Report Status 01/18/2020 FINAL  Final   Organism ID, Bacteria KLEBSIELLA PNEUMONIAE (A)  Final      Susceptibility   Klebsiella pneumoniae - MIC*    AMPICILLIN RESISTANT Resistant     CEFAZOLIN <=4 SENSITIVE Sensitive     CEFTRIAXONE <=0.25 SENSITIVE Sensitive     CIPROFLOXACIN <=0.25 SENSITIVE Sensitive     GENTAMICIN <=1 SENSITIVE Sensitive     IMIPENEM <=0.25 SENSITIVE Sensitive     NITROFURANTOIN 32 SENSITIVE Sensitive     TRIMETH/SULFA <=20 SENSITIVE Sensitive     AMPICILLIN/SULBACTAM <=2 SENSITIVE Sensitive     PIP/TAZO <=4 SENSITIVE Sensitive     * >=100,000 COLONIES/mL KLEBSIELLA PNEUMONIAE  MRSA PCR Screening     Status: None   Collection Time: 01/16/20  5:41 AM   Specimen: Nasal Mucosa; Nasopharyngeal  Result Value Ref Range Status   MRSA by PCR NEGATIVE NEGATIVE Final    Comment:        The GeneXpert MRSA Assay (FDA approved for NASAL specimens only), is one component of a comprehensive MRSA colonization surveillance program. It is not intended to diagnose MRSA infection nor to guide or monitor treatment for MRSA infections. Performed at [facility], [address] , [place] [address]          Radiology Studies: DG Chest 2 View  Result  Date: 01/19/2020 CLINICAL DATA:  85-year-old female with shortness of breath. Leukocytosis. EXAM: CHEST - 2 VIEW COMPARISON:  Chest radiographs 01/15/2020 and earlier. FINDINGS: AP and lateral views of the chest. Stable mild cardiomegaly. Visualized tracheal air column is within normal limits. Increased small bilateral pleural effusions, layering posteriorly on the lateral. Continued streaky additional bilateral lower lobe opacity. Although pulmonary vascularity appears only mildly increased since 2019. No air bronchograms. No pneumothorax. No acute osseous abnormality identified. Increased gas distended bowel loops in the upper abdomen. IMPRESSION: 1. Small but increased bilateral pleural effusions from recent radiographs. Streaky additional bilateral lower lobe opacity with some increased vascular congestion compared to 2019. Differential considerations include mild interstitial edema, pleural effusions with atelectasis, or infection. 2. Increased gas distended bowel in the visible abdomen since 01/15/2020. Electronically Signed   By: H  Hall [name]   On: 01/19/2020 10:27   US Abdomen Limited RUQ  Result Date: 01/17/2020 CLINICAL DATA:  Abnormal LFTs prior cholecystectomy EXAM: ULTRASOUND ABDOMEN LIMITED RIGHT UPPER QUADRANT COMPARISON:  CT 01/15/2020 FINDINGS: Gallbladder: Surgically absent. Common bile duct: Diameter: 5 mm, less pronounced dilatation on comparison CT. Liver: Diffusely increased hepatic echogenicity with loss of definition of the portal triads and diminished posterior through transmission compatible with hepatic steatosis. No focal liver lesion identified. Portal vein is patent on color Doppler imaging with normal direction of blood flow towards the liver. Other: None. IMPRESSION: 1. Diffusely increased hepatic echogenicity most often compatible with hepatic steatosis. 2. Prior cholecystectomy. 3. No significant biliary ductal dilatation. Electronically Signed   By: Price  DeHay [name]   On:  01/17/2020 22:26        Scheduled Meds: . amiodarone  200 mg Oral BID  . atorvastatin  40 mg Oral Daily  . collagenase   Topical Daily  . docusate sodium  100 mg Oral BID  . feeding supplement (  ENSURE ENLIVE)  237 mL Oral BID BM  . furosemide  20 mg Intravenous BID  . insulin aspart  0-9 Units Subcutaneous Q4H  . mouth rinse  15 mL Mouth Rinse BID  . midodrine  10 mg Oral BID WC  . multivitamin with minerals  1 tablet Oral Daily  . peg 3350 powder  0.5 kit Oral Once   And  . [START ON 01/20/2020] peg 3350 powder  0.5 kit Oral Once  . potassium chloride  40 mEq Oral TID  . sodium chloride flush  3 mL Intravenous Q12H   Continuous Infusions: . levofloxacin (LEVAQUIN) IV Stopped (01/18/20 1435)     LOS: 3 days    Time spent: 25min  Quanesha Klimaszewski, MD [facility]  01/19/2020, 3:26 PM  

## 2020-01-19 NOTE — Op Note (Signed)
[facility] Patient Name: Kerry Barnes Procedure Date : 01/19/2020 MRN: [id number] Attending MD: Milus Banister , MD Date of Birth: July 22, 1933 CSN: [id number] Age: 86 Admit Type: Inpatient Procedure:                Colonoscopy Indications:              Abnormal CT of the GI tract Providers:                Milus Banister, MD, Glori Bickers, RN, Lazaro Arms, Technician Referring MD:              Medicines:                Monitored Anesthesia Care Complications:            No immediate complications. Estimated blood loss:                            None. Estimated Blood Loss:     Estimated blood loss: none. Procedure:                After obtaining informed consent, the colonoscope                            was passed under direct vision. Throughout the                            procedure, the patient's blood pressure, pulse, and                            oxygen saturations were monitored continuously. The                            CF-HQ190L ([id number] Olympus colonoscope was                            introduced through the anus with the intention of                            advancing to the cecum. The scope was advanced to                            the rectum before the procedure was aborted.                            Medications were given. The colonoscopy was                            performed without difficulty. The patient tolerated                            the procedure well. The quality of the bowel  preparation was poor. Scope In: 12:39:51 PM Scope Out: 12:42:06 PM Total Procedure Duration: 0 hours 2 minutes 15 seconds  Findings:      A large amount of stool was found in the rectum, precluding       visualization. Impression:               - Preparation of the colon was very poor, this                            precluded really any visualization of even the most                             distal colon. Recommendation:           - Return patient to hospital ward for ongoing care.                           - I am going to order repeat full bowel prep and                            will try this again tomorrow, Saturday. Procedure Code(s):        --- Professional ---                           [phone], 42, Colonoscopy, flexible; diagnostic,                            including collection of specimen(s) by brushing or                            washing, when performed (separate procedure) Diagnosis Code(s):        --- Professional ---                           R93.3, Abnormal findings on diagnostic imaging of                            other parts of digestive tract CPT copyright 2019 [employer]. All rights reserved. The codes documented in this report are preliminary and upon coder review may  be revised to meet current compliance requirements. Milus Banister, MD 01/19/2020 12:49:38 PM This report has been signed electronically. Number of Addenda: 0

## 2020-01-19 NOTE — Transfer of Care (Signed)
Immediate Anesthesia Transfer of Care Note  Patient: Kerry Barnes  Procedure(s) Performed: INVASIVE LAB ABORTED CASE (N/A )  Patient Location: Endoscopy Unit  Anesthesia Type:MAC  Level of Consciousness: awake and responds to stimulation  Airway & Oxygen Therapy: Patient Spontanous Breathing and Patient connected to nasal cannula oxygen  Post-op Assessment: Report given to RN and Post -op Vital signs reviewed and stable  Post vital signs: Reviewed and stable  Last Vitals:  Vitals Value Taken Time  BP 127/38 01/19/20 1250  Temp    Pulse 55 01/19/20 1250  Resp 24 01/19/20 1250  SpO2 99 % 01/19/20 1250  Vitals shown include unvalidated device data.  Last Pain:  Vitals:   01/19/20 1149  TempSrc: Temporal  PainSc: 0-No pain         Complications: No complications documented.

## 2020-01-20 ENCOUNTER — Encounter (HOSPITAL_COMMUNITY): Payer: Self-pay | Admitting: Pulmonary Disease

## 2020-01-20 ENCOUNTER — Inpatient Hospital Stay (HOSPITAL_COMMUNITY): Payer: HMO | Admitting: Certified Registered Nurse Anesthetist

## 2020-01-20 ENCOUNTER — Encounter (HOSPITAL_COMMUNITY)
Admission: EM | Disposition: A | Discharge status: Skilled Nursing Facility | Payer: HMO | Source: Home / Self Care | Attending: Internal Medicine

## 2020-01-20 HISTORY — PX: COLONOSCOPY WITH PROPOFOL: SHX5780

## 2020-01-20 LAB — BASIC METABOLIC PANEL
Anion gap: 13 (ref 5–15)
BUN: 17 mg/dL (ref 8–23)
CO2: 19 mmol/L — ABNORMAL LOW (ref 22–32)
Calcium: 7.8 mg/dL — ABNORMAL LOW (ref 8.9–10.3)
Chloride: 109 mmol/L (ref 98–111)
Creatinine, Ser: 1.15 mg/dL — ABNORMAL HIGH (ref 0.44–1.00)
GFR calc Af Amer: 51 mL/min — ABNORMAL LOW (ref >60)
GFR calc non Af Amer: 44 mL/min — ABNORMAL LOW (ref >60)
Glucose, Bld: 146 mg/dL — ABNORMAL HIGH (ref 70–99)
Potassium: 2.7 mmol/L — CL (ref 3.5–5.1)
Sodium: 141 mmol/L (ref 135–145)

## 2020-01-20 LAB — POCT I-STAT, CHEM 8
BUN: 21 mg/dL (ref 8–23)
Calcium, Ion: 0.96 mmol/L — ABNORMAL LOW (ref 1.15–1.40)
Chloride: 115 mmol/L — ABNORMAL HIGH (ref 98–111)
Creatinine, Ser: 0.9 mg/dL (ref 0.44–1.00)
Glucose, Bld: 99 mg/dL (ref 70–99)
HCT: 32 % — ABNORMAL LOW (ref 36.0–46.0)
Hemoglobin: 10.9 g/dL — ABNORMAL LOW (ref 12.0–15.0)
Potassium: 4 mmol/L (ref 3.5–5.1)
Sodium: 144 mmol/L (ref 135–145)
TCO2: 20 mmol/L — ABNORMAL LOW (ref 22–32)

## 2020-01-20 LAB — CBC
HCT: 24.4 % — ABNORMAL LOW (ref 36.0–46.0)
Hemoglobin: 8 g/dL — ABNORMAL LOW (ref 12.0–15.0)
MCH: 25.7 pg — ABNORMAL LOW (ref 26.0–34.0)
MCHC: 32.8 g/dL (ref 30.0–36.0)
MCV: 78.5 fL — ABNORMAL LOW (ref 80.0–100.0)
Platelets: 300 10*3/uL (ref 150–400)
RBC: 3.11 MIL/uL — ABNORMAL LOW (ref 3.87–5.11)
RDW: 19.9 % — ABNORMAL HIGH (ref 11.5–15.5)
WBC: 21.2 10*3/uL — ABNORMAL HIGH (ref 4.0–10.5)
nRBC: 0.1 % (ref 0.0–0.2)

## 2020-01-20 LAB — URINE CULTURE: Culture: NO GROWTH

## 2020-01-20 LAB — CULTURE, BLOOD (ROUTINE X 2)
Culture: NO GROWTH
Culture: NO GROWTH
Special Requests: ADEQUATE

## 2020-01-20 LAB — GLUCOSE, CAPILLARY
Glucose-Capillary: 108 mg/dL — ABNORMAL HIGH (ref 70–99)
Glucose-Capillary: 112 mg/dL — ABNORMAL HIGH (ref 70–99)
Glucose-Capillary: 130 mg/dL — ABNORMAL HIGH (ref 70–99)
Glucose-Capillary: 85 mg/dL (ref 70–99)

## 2020-01-20 LAB — MAGNESIUM: Magnesium: 1.6 mg/dL — ABNORMAL LOW (ref 1.7–2.4)

## 2020-01-20 SURGERY — COLONOSCOPY WITH PROPOFOL
Anesthesia: Monitor Anesthesia Care

## 2020-01-20 MED ORDER — POTASSIUM CHLORIDE 10 MEQ/100ML IV SOLN
10.0000 meq | INTRAVENOUS | Status: AC
  Administered 2020-01-20 (×6): 10 meq via INTRAVENOUS
  Filled 2020-01-20 (×6): qty 100

## 2020-01-20 MED ORDER — SODIUM CHLORIDE 0.9 % IV SOLN: INTRAVENOUS | Status: DC

## 2020-01-20 MED ORDER — PHENYLEPHRINE 40 MCG/ML (10ML) SYRINGE FOR IV PUSH (FOR BLOOD PRESSURE SUPPORT)
PREFILLED_SYRINGE | INTRAVENOUS | Status: DC | PRN
  Administered 2020-01-20: 40 ug via INTRAVENOUS

## 2020-01-20 MED ORDER — POTASSIUM CHLORIDE CRYS ER 20 MEQ PO TBCR
40.0000 meq | EXTENDED_RELEASE_TABLET | Freq: Once | ORAL | Status: AC
  Administered 2020-01-20: 40 meq via ORAL
  Filled 2020-01-20: qty 2

## 2020-01-20 MED ORDER — PROPOFOL 10 MG/ML IV BOLUS
INTRAVENOUS | Status: DC | PRN
  Administered 2020-01-20 (×2): 10 mg via INTRAVENOUS

## 2020-01-20 MED ORDER — LACTATED RINGERS IV SOLN: INTRAVENOUS | Status: DC | PRN

## 2020-01-20 MED ORDER — CHLORHEXIDINE GLUCONATE CLOTH 2 % EX PADS
6.0000 | MEDICATED_PAD | Freq: Every day | CUTANEOUS | Status: DC
  Administered 2020-01-20 – 2020-01-25 (×6): 6 via TOPICAL

## 2020-01-20 MED ORDER — TAMSULOSIN HCL 0.4 MG PO CAPS
0.4000 mg | ORAL_CAPSULE | Freq: Every day | ORAL | Status: DC
  Administered 2020-01-20 – 2020-01-22 (×3): 0.4 mg via ORAL
  Filled 2020-01-20 (×3): qty 1

## 2020-01-20 MED ORDER — SODIUM CHLORIDE 0.9 % IV SOLN
2.0000 g | INTRAVENOUS | Status: AC
  Administered 2020-01-20 – 2020-01-22 (×3): 2 g via INTRAVENOUS
  Filled 2020-01-20 (×3): qty 20

## 2020-01-20 MED ORDER — MAGNESIUM SULFATE 2 GM/50ML IV SOLN
2.0000 g | Freq: Once | INTRAVENOUS | Status: AC
  Administered 2020-01-20: 2 g via INTRAVENOUS
  Filled 2020-01-20 (×2): qty 50

## 2020-01-20 MED ORDER — PROPOFOL 500 MG/50ML IV EMUL
INTRAVENOUS | Status: DC | PRN
  Administered 2020-01-20: 50 ug/kg/min via INTRAVENOUS

## 2020-01-20 SURGICAL SUPPLY — 22 items

## 2020-01-20 NOTE — Progress Notes (Signed)
PROGRESS NOTE    Kerry Barnes  MVH:[id number] DOB: May 15, 1934 DOA: 01/15/2020 PCP: Aletha Halim., PA-C  Brief Narrative: 86 year old female with PMHx of CKD III, atrial fibrillation on Eliquis, chronic diastolic heart failure, hypertension, hyperlipidemia, and diabetes mellitus presenting for worsening weakness, FTT noted to be hypotensive in the ED, admitted to ICU treated with IV fluids, Abx, blood pressure stabilized, CT abdomen pelvis noted lower lobe consolidation and changing caliber of the colon suggesting possible focal lesion -Transferred to [facility] service 8/4 -noted to have severe hypoalbuminemia, unstagable sacral wound, UTI, Gi consulted, colonoscopy planned, continues to have failure to thrive    Assessment & Plan:   Severe failure to thrive, weight loss, debility, severe hypoalbuminemia, anemia -Spouse reports decline for atleast 3 to 6 months, concern for malignancy, liver disease is also a consideration -GI consulting, plan for colonoscopy, abnormal caliber of the colon on CT -Right upper quadrant ultrasound noted abnormal liver echotexture?  Fatty liver, I wonder if she has cirrhosis, elastography not done on inpatient ultrasounds, HCV negative -Ferritin is significantly elevated raising consideration for hemochromatosis, but could purely be acute phase reactant -TSH is unremarkable -Prognosis is poor in the setting of ongoing debility, bedbound state, severe hypoalbuminemia, sacral decubitus ulcers etc., discussed poor prognosis with patient's spouse and patient, recommended consideration of DNR, patient would like to be full code with full scope of treatment  Multifocal pneumonia -CT on admission noted bilateral lower lobe consolidation -Treated with IV Levaquin in the ICU, change to ceftriaxone for better coverage for UTI/wound -Blood cultures negative -SLP eval completed, no concern for aspiration -Urine Legionella and strep pneumo antigen are negative  -Wean  O2  Klebsiella UTI Recurrent urinary retention -Continue ceftriaxone for at least 3 more days -Foley catheter placed today 8/7 due to recurrent urinary retention, also has a necrotic sacral decubitus wound  Unstageable sacral decubitus wound -With black eschar suspect some necrotic tissue -Wound nurse following -Will ask CCS to evaluate, may benefit from bedside debridement, WBC remains elevated  Metabolic encephalopathy -In the setting of infection, gabapentin -Mental status with slow improvement, gabapentin discontinued yesterday  Iron deficiency anemia -Anemia panel with iron deficiency and significantly elevated ferritin -GI consulted, given IV iron x1 8/5 -Colonoscopy being reattempted today -Peripheral smear noted microcytic anemia, hemoglobin electrophoresis is unremarkable -LDH is normal  Hypotension -Mild, may be chronic, maps greater than 60 -Clinically do not suspect sepsis -Blood cultures are negative -Started on midodrine, evaluation for cirrhosis  Paroxysmal atrial fibrillation -In sinus rhythm at this time, Eliquis on hold -Continue amiodarone   Acute on chronic diastolic CHF -Echo notes diastolic dysfunction, preserved EF and moderate mitral regurgitation -Appears slightly fluid overloaded -Lasix x 2 doses given yesterday  Hypokalemia -Due to GI losses from prep and Lasix -Replace  Type 2 diabetes mellitus -Januvia on hold, hba1c is 5.1 -Sliding scale insulin for now  Goal of care discussion -Poor prognosis discussed with patient and spouse 8/6 and 8/7, recommended consideration of DNR, patient would like to be a full code with full scope of treatment -Complete above work-up, may need palliative care discussions soon  DVT prophylaxis: Eliquis on hold Code Status: Full code Family Communication: Spouse at bedside 8/6 Disposition Plan:  Status is: Inpatient  Remains inpatient appropriate because:Inpatient level of care appropriate due to severity  of illness   Dispo: The patient is from: Home              Anticipated d/c is to: SNF  Anticipated d/c date is: Pending above work-up              Patient currently is not medically stable to d/c.  Consultants:   PCCM transfer  Gastroenterology   Procedures:   Antimicrobials:  Levaquin  Subjective: -More awake today, complains of weakness but no specific symptoms  Objective: Vitals:   01/20/20 0007 01/20/20 0407 01/20/20 0924 01/20/20 1102  BP: (!) 100/46 (!) 103/52 (!) 112/52 (!) 106/34  Pulse: 60 65 66 70  Resp: 20 16 14 20   Temp: 98.4 F (36.9 C) 97.7 F (36.5 C) (!) 97.5 F (36.4 C) 98 F (36.7 C)  TempSrc: Oral Oral Oral Oral  SpO2: 98% 97% 94% 97%  Weight:      Height:        Intake/Output Summary (Last 24 hours) at 01/20/2020 1134 Last data filed at 01/20/2020 0500 Gross per 24 hour  Intake 400.32 ml  Output 1825 ml  Net -1424.68 ml   Filed Weights   01/18/20 2029 01/19/20 0500 01/19/20 2007  Weight: 50.2 kg 51 kg 51.3 kg    Examination:  General exam: Chronically ill elderly, extremely cachectic female sitting up in bed, awake alert oriented to self and place, flat affect HEENT: Positive JVD CVS: S1-S2, regular rate rhythm Lungs: Decreased breath sounds the bases, otherwise clear Abdomen: Soft, nontender, bowel sounds present Extremities: Trace edema  Skin: 4 x 4 centimeter sacral wound with yellow slough and gray/black eschar Psychiatry: Flat affect    Data Reviewed:   CBC: Recent Labs  Lab 01/15/20 1901 01/15/20 1901 01/16/20 0517 01/17/20 0456 01/18/20 0325 01/19/20 0753 01/20/20 0220  WBC 20.3*   < > 17.3* 18.4* 16.9* 21.8* 21.2*  NEUTROABS 17.6*  --  15.4*  --   --   --   --   HGB 10.5*   < > 7.7* 7.8* 8.1* 8.4* 8.0*  HCT 32.2*   < > 24.1* 24.0* 24.5* 25.0* 24.4*  MCV 80.7   < > 80.3 79.2* 79.5* 79.6* 78.5*  PLT 334   < > 268 250 313 285 300   < > = values in this interval not displayed.   Basic Metabolic  Panel: Recent Labs  Lab 01/15/20 1901 01/16/20 0946 01/16/20 1240 01/17/20 0456 01/18/20 0325 01/19/20 0753 01/20/20 0220  NA 133*   < > 133* 135 134* 138 141  K 4.2   < > 3.5 3.6 3.4* 3.0* 2.7*  CL 101   < > 106 108 106 109 109  CO2 23   < > 19* 19* 21* 19* 19*  GLUCOSE 101*   < > 91 86 106* 93 146*  BUN 22   < > 14 11 13 17 17   CREATININE 1.18*   < > 0.83 0.82 0.91 1.12* 1.15*  CALCIUM 8.3*   < > 7.3* 7.2* 7.1* 7.7* 7.8*  MG 1.9  --   --   --   --   --  1.6*  PHOS 2.7  --   --   --   --   --   --    < > = values in this interval not displayed.   GFR: Estimated Creatinine Clearance: 28 mL/min (A) (by C-G formula based on SCr of 1.15 mg/dL (H)). Liver Function Tests: Recent Labs  Lab 01/15/20 1901 01/16/20 0946 01/17/20 0456 01/18/20 0325 01/19/20 0753  AST 125* 68* 73* 69* 64*  ALT 92* 53* 55* 51* 48*  ALKPHOS 101 60 73 88 123  BILITOT 2.5* 1.5* 1.8* 1.4* 1.0  PROT 7.0 4.5* 4.7* 5.1* 5.4*  ALBUMIN 1.9* 1.4* 1.4* 1.4* 1.5*   No results for input(s): LIPASE, AMYLASE in the last 168 hours. Recent Labs  Lab 01/15/20 2237  AMMONIA 21   Coagulation Profile: Recent Labs  Lab 01/16/20 0946  INR 1.5*   Cardiac Enzymes: Recent Labs  Lab 01/15/20 2337  CKTOTAL 534*   BNP (last 3 results) No results for input(s): PROBNP in the last 8760 hours. HbA1C: No results for input(s): HGBA1C in the last 72 hours. CBG: Recent Labs  Lab 01/19/20 1132 01/19/20 1609 01/19/20 2008 01/20/20 0024 01/20/20 0403  GLUCAP 109* 136* 146* 108* 130*   Lipid Profile: No results for input(s): CHOL, HDL, LDLCALC, TRIG, CHOLHDL, LDLDIRECT in the last 72 hours. Thyroid Function Tests: No results for input(s): TSH, T4TOTAL, FREET4, T3FREE, THYROIDAB in the last 72 hours. Anemia Panel: No results for input(s): VITAMINB12, FOLATE, FERRITIN, TIBC, IRON, RETICCTPCT in the last 72 hours. Urine analysis:    Component Value Date/Time   COLORURINE AMBER (A) 01/19/2020 0912    APPEARANCEUR CLEAR 01/19/2020 0912   LABSPEC 1.025 01/19/2020 0912   PHURINE 5.0 01/19/2020 0912   GLUCOSEU NEGATIVE 01/19/2020 0912   HGBUR MODERATE (A) 01/19/2020 0912   BILIRUBINUR NEGATIVE 01/19/2020 0912   KETONESUR NEGATIVE 01/19/2020 0912   PROTEINUR 30 (A) 01/19/2020 0912   UROBILINOGEN 0.2 09/08/2010 1330   NITRITE NEGATIVE 01/19/2020 0912   LEUKOCYTESUR NEGATIVE 01/19/2020 0912   Sepsis Labs: @LABRCNTIP (procalcitonin:4,lacticidven:4)  ) Recent Results (from the past 240 hour(s))  SARS Coronavirus 2 by RT PCR (hospital order, performed in [facility] hospital lab) Nasopharyngeal Nasopharyngeal Swab     Status: None   Collection Time: 01/15/20  7:00 PM   Specimen: Nasopharyngeal Swab  Result Value Ref Range Status   SARS Coronavirus 2 NEGATIVE NEGATIVE Final    Comment: (NOTE) SARS-CoV-2 target nucleic acids are NOT DETECTED.  The SARS-CoV-2 RNA is generally detectable in upper and lower respiratory specimens during the acute phase of infection. The lowest concentration of SARS-CoV-2 viral copies this assay can detect is 250 copies / mL. A negative result does not preclude SARS-CoV-2 infection and should not be used as the sole basis for treatment or other patient management decisions.  A negative result may occur with improper specimen collection / handling, submission of specimen other than nasopharyngeal swab, presence of viral mutation(s) within the areas targeted by this assay, and inadequate number of viral copies (<250 copies / mL). A negative result must be combined with clinical observations, patient history, and epidemiological information.  Fact Sheet for Patients:   StrictlyIdeas.no  Fact Sheet for Healthcare Providers: BankingDealers.co.za  This test is not yet approved or  cleared by the [place] FDA and has been authorized for detection and/or diagnosis of SARS-CoV-2 by FDA under an Emergency Use  Authorization (EUA).  This EUA will remain in effect (meaning this test can be used) for the duration of the COVID-19 declaration under Section 564(b)(1) of the Act, 21 U.S.C. section 360bbb-3(b)(1), unless the authorization is terminated or revoked sooner.  Performed at [facility], [place] [address] [place], [place] [address]   Culture, blood (Routine x 2)     Status: None   Collection Time: 01/15/20  7:01 PM   Specimen: BLOOD  Result Value Ref Range Status   Specimen Description BLOOD SITE NOT SPECIFIED  Final   Special Requests   Final    BOTTLES DRAWN AEROBIC AND  ANAEROBIC Blood Culture adequate volume   Culture   Final    NO GROWTH 5 DAYS Performed at [facility], [place] [address] [place], [place] [address]    Report Status 01/20/2020 FINAL  Final  Culture, blood (Routine x 2)     Status: None   Collection Time: 01/15/20  7:48 PM   Specimen: BLOOD RIGHT FOREARM  Result Value Ref Range Status   Specimen Description BLOOD RIGHT FOREARM  Final   Special Requests   Final    BOTTLES DRAWN AEROBIC ONLY Blood Culture results may not be optimal due to an inadequate volume of blood received in culture bottles   Culture   Final    NO GROWTH 5 DAYS Performed at [facility], [place] [address] [place], [place] [address]    Report Status 01/20/2020 FINAL  Final  Urine culture     Status: Abnormal   Collection Time: 01/15/20  7:55 PM   Specimen: Urine, Random  Result Value Ref Range Status   Specimen Description URINE, RANDOM  Final   Special Requests   Final    NONE Performed at [facility], [place] [address], [place], [place] [address]    Culture >=100,000 COLONIES/mL KLEBSIELLA PNEUMONIAE (A)  Final   Report Status 01/18/2020 FINAL  Final   Organism ID, Bacteria KLEBSIELLA PNEUMONIAE (A)  Final      Susceptibility   Klebsiella pneumoniae - MIC*    AMPICILLIN RESISTANT Resistant     CEFAZOLIN <=4 SENSITIVE Sensitive     CEFTRIAXONE <=0.25 SENSITIVE  Sensitive     CIPROFLOXACIN <=0.25 SENSITIVE Sensitive     GENTAMICIN <=1 SENSITIVE Sensitive     IMIPENEM <=0.25 SENSITIVE Sensitive     NITROFURANTOIN 32 SENSITIVE Sensitive     TRIMETH/SULFA <=20 SENSITIVE Sensitive     AMPICILLIN/SULBACTAM <=2 SENSITIVE Sensitive     PIP/TAZO <=4 SENSITIVE Sensitive     * >=100,000 COLONIES/mL KLEBSIELLA PNEUMONIAE  MRSA PCR Screening     Status: None   Collection Time: 01/16/20  5:41 AM   Specimen: Nasal Mucosa; Nasopharyngeal  Result Value Ref Range Status   MRSA by PCR NEGATIVE NEGATIVE Final    Comment:        The GeneXpert MRSA Assay (FDA approved for NASAL specimens only), is one component of a comprehensive MRSA colonization surveillance program. It is not intended to diagnose MRSA infection nor to guide or monitor treatment for MRSA infections. Performed at [facility], [place] [address], [place], [place] [address]          Radiology Studies: DG Chest 2 View  Result Date: 01/19/2020 CLINICAL DATA:  86 year old female with shortness of breath. Leukocytosis. EXAM: CHEST - 2 VIEW COMPARISON:  Chest radiographs 01/15/2020 and earlier. FINDINGS: AP and lateral views of the chest. Stable mild cardiomegaly. Visualized tracheal air column is within normal limits. Increased small bilateral pleural effusions, layering posteriorly on the lateral. Continued streaky additional bilateral lower lobe opacity. Although pulmonary vascularity appears only mildly increased since 2019. No air bronchograms. No pneumothorax. No acute osseous abnormality identified. Increased gas distended bowel loops in the upper abdomen. IMPRESSION: 1. Small but increased bilateral pleural effusions from recent radiographs. Streaky additional bilateral lower lobe opacity with some increased vascular congestion compared to 2019. Differential considerations include mild interstitial edema, pleural effusions with atelectasis, or infection. 2. Increased gas distended bowel in  the visible abdomen since 01/15/2020. Electronically Signed   By: Herminio Heads.D.  On: 01/19/2020 10:27        Scheduled Meds: . [MAR Hold] amiodarone  200 mg Oral BID  . [MAR Hold] atorvastatin  40 mg Oral Daily  . [MAR Hold] collagenase   Topical Daily  . [MAR Hold] docusate sodium  100 mg Oral BID  . [MAR Hold] feeding supplement (ENSURE ENLIVE)  237 mL Oral BID BM  . [MAR Hold] insulin aspart  0-9 Units Subcutaneous Q4H  . [MAR Hold] mouth rinse  15 mL Mouth Rinse BID  . [MAR Hold] midodrine  10 mg Oral BID WC  . [MAR Hold] multivitamin with minerals  1 tablet Oral Daily  . [MAR Hold] sodium chloride flush  3 mL Intravenous Q12H  . [MAR Hold] tamsulosin  0.4 mg Oral Daily   Continuous Infusions: . sodium chloride    . [MAR Hold] cefTRIAXone (ROCEPHIN)  IV       LOS: 4 days    Time spent: 77min  Domenic Polite, MD [facility]  01/20/2020, 11:34 AM

## 2020-01-20 NOTE — Anesthesia Postprocedure Evaluation (Signed)
Anesthesia Post Note  Patient: Kerry Barnes  Procedure(s) Performed: COLONOSCOPY WITH PROPOFOL (N/A )     Anesthesia Type: MAC   No complications documented.  Last Vitals:  Vitals:   01/20/20 1233 01/20/20 1250  BP: (!) 106/44 (!) 112/48  Pulse: 63 65  Resp: (!) 22 (!) 22  Temp:    SpO2: 99% 99%    Last Pain:  Vitals:   01/20/20 1250  TempSrc:   PainSc: 0-No pain                 Ilianna Bown DAVID

## 2020-01-20 NOTE — TOC Initial Note (Signed)
Transition of Care [facility]) - Initial/Assessment Note    Patient Details  Name: Kerry Barnes MRN: [id number] Date of Birth: 14-May-1934  [facility]) CM/SW Contact:    Alexander Mt, LCSW Phone Number: 01/20/2020, 9:03 AM  Clinical Narrative:                 CSW spoke with pt spouse Claiborne Billings via telephone as pt only oriented to person and place (fluctuating). Introduced self, role, reason for visit. Pt spouse plans on coming to hospital later today. He confirms address and pt PCP. He states that pt has had "numbess and coldness in her legs, and a sore on her bottom I didn't notice at first". She usually uses a rollator or cane to mobilize, but due to the above issues hasn't been moving like she was. Pt spouse confirms their daughter's name and contact information. We discussed recommendation for SNF placement, pt spouse states interest and gives permission to send out referrals. TOC team will follow, pt will need insurance authorization and new COVID swab prior to d/c.   Expected Discharge Plan: Skilled Nursing Facility Barriers to Discharge: Continued Medical Work up, Ship broker   Patient Goals and CMS Choice Patient states their goals for this hospitalization and ongoing recovery are:: for her to get stronger; figure out what is going on with her legs CMS Medicare.gov Compare Post Acute Care list provided to:: Patient Represenative (must comment) (pt spouse Claiborne Billings) Choice offered to / list presented to : Spouse  Expected Discharge Plan and Services Expected Discharge Plan: Skilled Nursing Facility In-house Referral: Clinical Social Work Discharge Planning Services: CM Consult Post Acute Care Choice: [place], Museum/gallery conservator  Prior Living Arrangements/Services Lives with:: Spouse Patient language and need for interpreter reviewed:: Yes (no needs) Need for Family Participation in Patient Care: Yes (Comment) (assistance w/ daily  cares) Care giver support system in place?: Yes (comment) (spouse; adult children) Current home services: DME, Other (comment) [facility]) Criminal Activity/Legal Involvement Pertinent to Current Situation/Hospitalization: No - Comment as needed  Permission Sought/Granted Permission sought to share information with : Family Supports, Chartered certified accountant granted to share information with : No (pt w/ fluctuating orientation)  Share Information with NAME: Coreena Rubalcava  Permission granted to share info w AGENCY: SNFs  Permission granted to share info w Relationship: spouse  Permission granted to share info w Contact Information: [phone]  Emotional Assessment Appearance:: Other (Comment Required (telephone assessment w/ spouse) Attitude/Demeanor/Rapport: Other (comment) (telephone assessment w/ spouse) Affect (typically observed): Other (comment) (telephone assessment w/ spouse) Orientation: : Oriented to Self, Oriented to Place, Fluctuating Orientation (Suspected and/or reported Sundowners) Alcohol / Substance Use: Not Applicable Psych Involvement: No (comment)  Admission diagnosis:  Weakness [R53.1] Hypotension [I95.9] Sepsis ([place]) [A41.9] Urinary tract infection without hematuria, site unspecified [N39.0] Patient Active Problem List   Diagnosis Date Noted  . Abnormal CT scan, colon   . Protein-calorie malnutrition, severe 01/18/2020  . Hypotension 01/16/2020  . Elevated LFTs 01/15/2020  . Acute lower UTI 01/15/2020  . Sacral decubitus ulcer 01/15/2020  . Sepsis ([place]) 01/15/2020  . Anemia 01/15/2020  . Nonrheumatic tricuspid valve regurgitation 03/27/2019  . Other secondary pulmonary hypertension ([place]) 03/27/2019  . Laboratory examination 08/29/2018  . Chest pressure 04/23/2018  . Abdominal pain 04/23/2018  . Atypical chest pain   . Epigastric abdominal pain   . AKI (acute kidney injury) ([place]) 04/26/2017  . Chronic renal insufficiency, stage III  (moderate) ([place]) 04/17/2016  . Chronic  anticoagulation 02/05/2016  . Chest pain   . Acute kidney injury superimposed on CKD ([place])   . Atrial flutter, paroxysmal ([place]) 01/31/2016  . Asthma with acute exacerbation 09/10/2015  . Difficulty swallowing 09/10/2015  . History of acute bronchitis 09/10/2015  . Moderate persistent asthma 09/10/2015  . Sore throat 09/10/2015  . Left carotid bruit 04/23/2015  . Leg pain 10/10/2013  . Stroke ([place]) 06/16/2011  . Chronic diastolic heart failure ([place]) 11/06/2009  . PULMONARY HYPERTENSION, SECONDARY 07/04/2009  . ARM NUMBNESS 07/04/2009  . Type 2 diabetes with stage 3 chronic kidney disease GFR 30-59 ([place]) 07/03/2009  . Mixed hyperlipidemia 07/03/2009  . MITRAL REGURGITATION, MILD 07/03/2009  . Essential hypertension 07/03/2009  . Gastroparesis 07/03/2009  . DIVERTICULOSIS, COLON, WITH HEMORRHAGE 07/03/2009  . CONSTIPATION 07/03/2009  . IBS 07/03/2009  . ARTHRITIS 07/03/2009  . MIGRAINES, HX OF 07/03/2009  . History of disease 07/03/2009  . EUSTACHIAN TUBE DYSFUNCTION 04/04/2007  . ALLERGIC RHINITIS 04/04/2007  . ASTHMA 04/04/2007  . ESOPHAGITIS, REFLUX 04/04/2007  . Asthma 04/04/2007   PCP:  Aletha Halim., PA-C Pharmacy:   [place], [place] - [address] [place] [address] Phone: [phone] Fax: [phone]  [id number] - [place], [place] - [address] [place] [address] Phone: [phone] Fax: [phone]  [facility] [place], [place] - [address] AT [facility] [address] [place] [address] Phone: [phone] Fax: [phone]   Readmission Risk Interventions Readmission Risk Prevention Plan 01/20/2020  Transportation Screening Complete  PCP or Specialist Appt within 5-7 Days Not Complete  Not Complete comments disposition pending  Home Care Screening  Complete  Medication Review (RN CM) Referral to Pharmacy  Some recent data might be hidden

## 2020-01-20 NOTE — Transfer of Care (Signed)
Immediate Anesthesia Transfer of Care Note  Patient: Kerry Barnes  Procedure(s) Performed: COLONOSCOPY WITH PROPOFOL (N/A )  Patient Location: PACU  Anesthesia Type:MAC  Level of Consciousness: sedated  Airway & Oxygen Therapy: Patient Spontanous Breathing and Patient connected to face mask oxygen  Post-op Assessment: Report given to RN and Post -op Vital signs reviewed and stable  Post vital signs: Reviewed and stable  Last Vitals:  Vitals Value Taken Time  BP 103/69 01/20/20 1219  Temp 37.1 C 01/20/20 1218  Pulse 62 01/20/20 1219  Resp 23 01/20/20 1219  SpO2 100 % 01/20/20 1219  Vitals shown include unvalidated device data.  Last Pain:  Vitals:   01/20/20 1218  TempSrc: Temporal  PainSc:          Complications: No complications documented.

## 2020-01-20 NOTE — Op Note (Signed)
[facility] Patient Name: Kerry Barnes Procedure Date : 01/20/2020 MRN: [id number] Attending MD: Milus Banister , MD Date of Birth: 07/10/1933 CSN: [id number] Age: 86 Admit Type: Inpatient Procedure:                Colonoscopy Indications:              FTT, multifocal pneumonia, sacral decub, anormal CT                            of colon (dilated proximal with distinct transition                            around splenic flexure) Providers:                Milus Banister, MD, Wynonia Sours, RN, Laverda Sorenson, Technician, Fransico Setters Mbumina, Technician Referring MD:              Medicines:                Monitored Anesthesia Care Complications:            No immediate complications. Estimated blood loss:                            None. Estimated Blood Loss:     Estimated blood loss: none. Procedure:                Pre-Anesthesia Assessment:                           - Prior to the procedure, a History and Physical                            was performed, and patient medications and                            allergies were reviewed. The patient's tolerance of                            previous anesthesia was also reviewed. The risks                            and benefits of the procedure and the sedation                            options and risks were discussed with the patient.                            All questions were answered, and informed consent                            was obtained. Prior Anticoagulants: The patient has  taken Eliquis (apixaban), last dose was 5 days                            prior to procedure. ASA Grade Assessment: IV - A                            patient with severe systemic disease that is a                            constant threat to life. After reviewing the risks                            and benefits, the patient was deemed in                            satisfactory  condition to undergo the procedure.                           After obtaining informed consent, the colonoscope                            was passed under direct vision. Throughout the                            procedure, the patient's blood pressure, pulse, and                            oxygen saturations were monitored continuously. The                            PCF-H190DL ([id number] Olympus pediatric colonoscope                            was introduced through the anus and advanced to the                            the cecum, identified by appendiceal orifice and                            ileocecal valve. The colonoscopy was performed                            without difficulty. The patient tolerated the                            procedure well. The quality of the bowel                            preparation was adequate. Scope In: 11:54:14 AM Scope Out: 12:07:34 PM Scope Withdrawal Time: 0 hours 8 minutes 7 seconds  Total Procedure Duration: 0 hours 13 minutes 20 seconds  Findings:      The entire examined colon appeared normal on direct and retroflexion       views. Impression:               -  The entire examined colon is normal on direct and                            retroflexion views. Recommendation:           - Return patient to hospital ward for ongoing care.                           - NO further GI testing is recommended.                           - Please call or page with any further questions or                            concerns. Procedure Code(s):        --- Professional ---                           [phone], Colonoscopy, flexible; diagnostic, including                            collection of specimen(s) by brushing or washing,                            when performed (separate procedure) Diagnosis Code(s):        --- Professional ---                           R93.3, Abnormal findings on diagnostic imaging of                            other parts of  digestive tract CPT copyright 2019 [employer]. All rights reserved. The codes documented in this report are preliminary and upon coder review may  be revised to meet current compliance requirements. Milus Banister, MD 01/20/2020 12:13:59 PM This report has been signed electronically. Number of Addenda: 0

## 2020-01-20 NOTE — NC FL2 (Signed)
[place] LEVEL OF CARE SCREENING TOOL     IDENTIFICATION  Patient Name: Kerry Barnes Birthdate: [birth year] Sex: female Admission Date (Current Location): 01/15/2020  [facility] and [place] Number:  Herbalist and Address:  [facility], [place] [address] [place], [place] [address]      Provider Number: [id number]  Attending Physician Name and Address:  Domenic Polite, MD  Relative Name and Phone Number:       Current Level of Care: Hospital Recommended Level of Care: [place] Prior Approval Number:    Date Approved/Denied:   PASRR Number: [id number]  Discharge Plan: SNF    Current Diagnoses: Patient Active Problem List   Diagnosis Date Noted  . Abnormal CT scan, colon   . Protein-calorie malnutrition, severe 01/18/2020  . Hypotension 01/16/2020  . Elevated LFTs 01/15/2020  . Acute lower UTI 01/15/2020  . Sacral decubitus ulcer 01/15/2020  . Sepsis ([place]) 01/15/2020  . Anemia 01/15/2020  . Nonrheumatic tricuspid valve regurgitation 03/27/2019  . Other secondary pulmonary hypertension ([place]) 03/27/2019  . Laboratory examination 08/29/2018  . Chest pressure 04/23/2018  . Abdominal pain 04/23/2018  . Atypical chest pain   . Epigastric abdominal pain   . AKI (acute kidney injury) ([place]) 04/26/2017  . Chronic renal insufficiency, stage III (moderate) ([place]) 04/17/2016  . Chronic anticoagulation 02/05/2016  . Chest pain   . Acute kidney injury superimposed on CKD ([place])   . Atrial flutter, paroxysmal ([place]) 01/31/2016  . Asthma with acute exacerbation 09/10/2015  . Difficulty swallowing 09/10/2015  . History of acute bronchitis 09/10/2015  . Moderate persistent asthma 09/10/2015  . Sore throat 09/10/2015  . Left carotid bruit 04/23/2015  . Leg pain 10/10/2013  . Stroke ([place]) 06/16/2011  . Chronic diastolic heart failure ([place]) 11/06/2009  . PULMONARY HYPERTENSION, SECONDARY 07/04/2009  . ARM NUMBNESS  07/04/2009  . Type 2 diabetes with stage 3 chronic kidney disease GFR 30-59 ([place]) 07/03/2009  . Mixed hyperlipidemia 07/03/2009  . MITRAL REGURGITATION, MILD 07/03/2009  . Essential hypertension 07/03/2009  . Gastroparesis 07/03/2009  . DIVERTICULOSIS, COLON, WITH HEMORRHAGE 07/03/2009  . CONSTIPATION 07/03/2009  . IBS 07/03/2009  . ARTHRITIS 07/03/2009  . MIGRAINES, HX OF 07/03/2009  . History of disease 07/03/2009  . EUSTACHIAN TUBE DYSFUNCTION 04/04/2007  . ALLERGIC RHINITIS 04/04/2007  . ASTHMA 04/04/2007  . ESOPHAGITIS, REFLUX 04/04/2007  . Asthma 04/04/2007    Orientation RESPIRATION BLADDER Height & Weight     Self, Place  Normal Incontinent, External catheter Weight: 113 lb 1.5 oz (51.3 kg) Height:  5\' 1"  (154.9 cm)  BEHAVIORAL SYMPTOMS/MOOD NEUROLOGICAL BOWEL NUTRITION STATUS      Incontinent Diet (see discharge summary)  AMBULATORY STATUS COMMUNICATION OF NEEDS Skin   Extensive Assist Verbally PU Stage and Appropriate Care, Other (Comment) (cracking on bilateral heels; generalized ecchymosis; unstageable PU on sacrum w/ foam daily dressings)       PU Stage 4 Dressing:  (unstageable on sacrum w/ foam dressing daily changes)               Personal Care Assistance Level of Assistance  Bathing, Feeding, Dressing Bathing Assistance: Maximum assistance Feeding assistance: Independent Dressing Assistance: Maximum assistance     Functional Limitations Info  Sight, Hearing, Speech Sight Info: Adequate Hearing Info: Adequate Speech Info: Adequate    SPECIAL CARE FACTORS FREQUENCY  OT (By licensed OT), PT (By licensed PT)     PT Frequency: 5x week OT Frequency: 5x week  Contractures Contractures Info: Not present    Additional Factors Info  Code Status, Allergies, Insulin Sliding Scale Code Status Info: Full Code Allergies Info: Codeine, Metformin, Amoxicillin, Biaxin (Clarithromycin), Ciprofloxacin, Penicillins   Insulin Sliding Scale Info:  insulin aspart (novoLOG) injection 0-9 Units every 4 hours       Current Medications (01/20/2020):  This is the current hospital active medication list Current Facility-Administered Medications  Medication Dose Route Frequency Provider Last Rate Last Admin  . acetaminophen (TYLENOL) tablet 650 mg  650 mg Oral Q6H PRN Shela Leff, MD   650 mg at 01/19/20 2112  . albuterol (VENTOLIN HFA) 108 (90 Base) MCG/ACT inhaler 2 puff  2 puff Inhalation Q6H PRN Milus Banister, MD      . amiodarone (PACERONE) tablet 200 mg  200 mg Oral BID Milus Banister, MD   200 mg at 01/19/20 2112  . atorvastatin (LIPITOR) tablet 40 mg  40 mg Oral Daily Milus Banister, MD   40 mg at 01/19/20 1409  . cefTRIAXone (ROCEPHIN) 2 g in sodium chloride 0.9 % 100 mL IVPB  2 g Intravenous Q24H Domenic Polite, MD      . collagenase Annitta Needs) ointment   Topical Daily Milus Banister, MD   Given at 01/19/20 1410  . docusate sodium (COLACE) capsule 100 mg  100 mg Oral BID Milus Banister, MD   100 mg at 01/19/20 2114  . docusate sodium (COLACE) capsule 100 mg  100 mg Oral BID PRN Milus Banister, MD   100 mg at 01/19/20 2112  . feeding supplement (ENSURE ENLIVE) (ENSURE ENLIVE) liquid 237 mL  237 mL Oral BID BM Milus Banister, MD   237 mL at 01/19/20 1414  . insulin aspart (novoLOG) injection 0-9 Units  0-9 Units Subcutaneous Q4H Milus Banister, MD   1 Units at 01/20/20 0805  . MEDLINE mouth rinse  15 mL Mouth Rinse BID Milus Banister, MD   15 mL at 01/19/20 2114  . midodrine (PROAMATINE) tablet 10 mg  10 mg Oral BID WC Milus Banister, MD   10 mg at 01/19/20 1533  . multivitamin with minerals tablet 1 tablet  1 tablet Oral Daily Milus Banister, MD   1 tablet at 01/19/20 1412  . ondansetron (ZOFRAN) tablet 4 mg  4 mg Oral Q6H PRN Milus Banister, MD       Or  . ondansetron [facility]) injection 4 mg  4 mg Intravenous Q6H PRN Milus Banister, MD   4 mg at 01/20/20 0804  . polyethylene glycol (MIRALAX / GLYCOLAX)  packet 17 g  17 g Oral Daily PRN Milus Banister, MD      . potassium chloride 10 mEq in 100 mL IVPB  10 mEq Intravenous Q1 Hr x 6 Shela Leff, MD 100 mL/hr at 01/20/20 0911 10 mEq at 01/20/20 0911  . sodium chloride flush (NS) 0.9 % injection 3 mL  3 mL Intravenous Q12H Milus Banister, MD   3 mL at 01/19/20 2115  . tamsulosin (FLOMAX) capsule 0.4 mg  0.4 mg Oral Daily Domenic Polite, MD         Discharge Medications: Please see discharge summary for a list of discharge medications.  Relevant Imaging Results:  Relevant Lab Results:   Additional Information [id number] [place], [place]

## 2020-01-20 NOTE — Progress Notes (Signed)
Patient potassium level low 2.7 this morning.Dr. Marlowe Sax notified.New orders received.

## 2020-01-20 NOTE — Interval H&P Note (Signed)
History and Physical Interval Note:  01/20/2020 10:33 AM  Kerry Barnes  has presented today for surgery, with the diagnosis of investigate abnormal intestinal pattern on CT and for source of declining blood counts.  The various methods of treatment have been discussed with the patient and family. After consideration of risks, benefits and other options for treatment, the patient has consented to  Procedure(s): COLONOSCOPY WITH PROPOFOL (N/A) as a surgical intervention.  The patient's history has been reviewed, patient examined, no change in status, stable for surgery.  I have reviewed the patient's chart and labs.  Questions were answered to the patient's satisfaction.     Milus Banister

## 2020-01-20 NOTE — Anesthesia Preprocedure Evaluation (Signed)
Anesthesia Evaluation  Patient identified by MRN, date of birth, ID band Patient awake    Reviewed: Allergy & Precautions, NPO status , Patient's Chart, lab work & pertinent test results  Airway Mallampati: I  TM Distance: >3 FB Neck ROM: Full    Dental   Pulmonary asthma ,    Pulmonary exam normal        Cardiovascular hypertension, Pt. on medications Normal cardiovascular exam     Neuro/Psych Anxiety CVA    GI/Hepatic GERD  Medicated and Controlled,  Endo/Other  diabetes, Type 2  Renal/GU      Musculoskeletal   Abdominal   Peds  Hematology   Anesthesia Other Findings   Reproductive/Obstetrics                             Anesthesia Physical Anesthesia Plan  ASA: III  Anesthesia Plan: MAC   Post-op Pain Management:    Induction:   PONV Risk Score and Plan: Treatment may vary due to age or medical condition  Airway Management Planned: Nasal Cannula  Additional Equipment:   Intra-op Plan:   Post-operative Plan:   Informed Consent: I have reviewed the patients History and Physical, chart, labs and discussed the procedure including the risks, benefits and alternatives for the proposed anesthesia with the patient or authorized representative who has indicated his/her understanding and acceptance.       Plan Discussed with: CRNA and Surgeon  Anesthesia Plan Comments:         Anesthesia Quick Evaluation

## 2020-01-20 NOTE — Consult Note (Signed)
CC: none  Requesting provider: Dr Broadus John  HPI: Kerry Barnes is an 86 y.o. female who is here for for worsening weakness, failure to thrive weight loss, hypoalbuminemia, anemia, ALTE focal pneumonia; Klebsiella UTI and sacral wound.  She was admitted 4 days ago with hypotension, progressive worsening weakness, failure to thrive.  She was admitted to the ICU and treated with IV fluids antibiotics.  CT suggested lower lobe consolidation and a questionable focal lesion in the splenic flexure of her colon.  She was started on antibiotics for pneumonia as well as a Klebsiella UTI.  She is undergone colonoscopy which found no lesion of concern.  Her colonoscopy was normal.  We were asked to see for her sacral wound to see if this could be the source of her leukocytosis  Her prealbumin was 5.7 on August 4 Albumin 1.5 White blood cell count 21,000  Is generally on Eliquis but has been off of it since she has been here  Also has a medical history of diabetes mellitus, chronic kidney disease 3, atrial fibrillation, congestive diastolic heart failure, hyperlipidemia  Past Medical History:  Diagnosis Date  . Anxiety   . Arthritis   . Asthma   . Atypical atrial flutter ([place])   . Cataract   . Chronic diastolic CHF (congestive heart failure) ([place])   . Fibromyalgia   . GERD (gastroesophageal reflux disease)   . History of blood transfusion   . Hyperlipemia   . Hypertension   . IBS (irritable bowel syndrome)   . Seasonal allergies   . Stroke [facility]) 2013   TIA  . Tubular adenoma of colon   . Type 2 diabetes mellitus ([place])     Past Surgical History:  Procedure Laterality Date  . ANKLE FRACTURE SURGERY    . CARDIOVERSION N/A 02/04/2016   Procedure: CARDIOVERSION;  Surgeon: Sueanne Margarita, MD;  Location: [facility] ENDOSCOPY;  Service: Cardiovascular;  Laterality: N/A;  . CARDIOVERSION N/A 05/01/2016   Procedure: CARDIOVERSION;  Surgeon: Fay Records, MD;  Location: [facility] ENDOSCOPY;  Service:  Cardiovascular;  Laterality: N/A;  . CARDIOVERSION N/A 04/25/2018   Procedure: CARDIOVERSION;  Surgeon: [name], MD;  Location: [place];  Service: Cardiovascular;  Laterality: N/A;  . CHOLECYSTECTOMY    . TEE WITHOUT CARDIOVERSION N/A 02/04/2016   Procedure: TRANSESOPHAGEAL ECHOCARDIOGRAM (TEE);  Surgeon: Sueanne Margarita, MD;  Location: [facility] ENDOSCOPY;  Service: Cardiovascular;  Laterality: N/A;  . TUBAL LIGATION    . WRIST FRACTURE SURGERY      Family History  Problem Relation Age of Onset  . Diabetes Mother   . Hypertension Mother   . Diabetes Father   . Diabetes Sister   . Diabetes Brother   . Hypertension Brother   . Colon cancer Neg Hx   . Stomach cancer Neg Hx     Social:  reports that she has never smoked. She has never used smokeless tobacco. She reports that she does not drink alcohol and does not use drugs.  Allergies:  Allergies  Allergen Reactions  . Codeine Nausea Only  . Metformin Diarrhea  . Amoxicillin Rash  . Biaxin [Clarithromycin] Rash  . Ciprofloxacin Rash  . Penicillins Rash    Childhood allergic reaction Has patient had a PCN reaction causing immediate rash, facial/tongue/throat swelling, SOB or lightheadedness with hypotension: Yes Has patient had a PCN reaction causing severe rash involving mucus membranes or skin necrosis: No Has patient had a PCN reaction that required hospitalization: Yes Has patient had a PCN reaction  occurring within the last 10 years: No If all of the above answers are "NO", then may proceed with Cephalosporin use.    Medications: I have reviewed the patient's current medications.  Results for orders placed or performed during the hospital encounter of 01/15/20 (from the past 48 hour(s))  Glucose, capillary     Status: Abnormal   Collection Time: 01/18/20  8:29 PM  Result Value Ref Range   Glucose-Capillary 120 (H) 70 - 99 mg/dL    Comment: Glucose reference range applies only to samples taken after fasting for at  least 8 hours.  Glucose, capillary     Status: Abnormal   Collection Time: 01/19/20 12:13 AM  Result Value Ref Range   Glucose-Capillary 128 (H) 70 - 99 mg/dL    Comment: Glucose reference range applies only to samples taken after fasting for at least 8 hours.  Glucose, capillary     Status: Abnormal   Collection Time: 01/19/20  4:41 AM  Result Value Ref Range   Glucose-Capillary 129 (H) 70 - 99 mg/dL    Comment: Glucose reference range applies only to samples taken after fasting for at least 8 hours.  CBC     Status: Abnormal   Collection Time: 01/19/20  7:53 AM  Result Value Ref Range   WBC 21.8 (H) 4.0 - 10.5 K/uL   RBC 3.14 (L) 3.87 - 5.11 MIL/uL   Hemoglobin 8.4 (L) 12.0 - 15.0 g/dL   HCT 25.0 (L) 36 - 46 %   MCV 79.6 (L) 80.0 - 100.0 fL   MCH 26.8 26.0 - 34.0 pg   MCHC 33.6 30.0 - 36.0 g/dL   RDW 19.8 (H) 11.5 - 15.5 %   Platelets 285 150 - 400 K/uL   nRBC 0.0 0.0 - 0.2 %    Comment: Performed at [facility] [address] [place], [place] [address]  Comprehensive metabolic panel     Status: Abnormal   Collection Time: 01/19/20  7:53 AM  Result Value Ref Range   Sodium 138 135 - 145 mmol/L   Potassium 3.0 (L) 3.5 - 5.1 mmol/L   Chloride 109 98 - 111 mmol/L   CO2 19 (L) 22 - 32 mmol/L   Glucose, Bld 93 70 - 99 mg/dL    Comment: Glucose reference range applies only to samples taken after fasting for at least 8 hours.   BUN 17 8 - 23 mg/dL   Creatinine, Ser 1.12 (H) 0.44 - 1.00 mg/dL   Calcium 7.7 (L) 8.9 - 10.3 mg/dL   Total Protein 5.4 (L) 6.5 - 8.1 g/dL   Albumin 1.5 (L) 3.5 - 5.0 g/dL   AST 64 (H) 15 - 41 U/L   ALT 48 (H) 0 - 44 U/L   Alkaline Phosphatase 123 38 - 126 U/L   Total Bilirubin 1.0 0.3 - 1.2 mg/dL   GFR calc non Af Amer 45 (L) >60 mL/min   GFR calc Af Amer 53 (L) >60 mL/min   Anion gap 10 5 - 15    Comment: Performed at [facility] [address] [place], [place] [address]  Glucose, capillary     Status: None   Collection Time:  01/19/20  7:57 AM  Result Value Ref Range   Glucose-Capillary 84 70 - 99 mg/dL    Comment: Glucose reference range applies only to samples taken after fasting for at least 8 hours.  Urinalysis, Routine w reflex microscopic Urine, Catheterized     Status:  Abnormal   Collection Time: 01/19/20  9:12 AM  Result Value Ref Range   Color, Urine AMBER (A) YELLOW    Comment: BIOCHEMICALS MAY BE AFFECTED BY COLOR   APPearance CLEAR CLEAR   Specific Gravity, Urine 1.025 1.005 - 1.030   pH 5.0 5.0 - 8.0   Glucose, UA NEGATIVE NEGATIVE mg/dL   Hgb urine dipstick MODERATE (A) NEGATIVE   Bilirubin Urine NEGATIVE NEGATIVE   Ketones, ur NEGATIVE NEGATIVE mg/dL   Protein, ur 30 (A) NEGATIVE mg/dL   Nitrite NEGATIVE NEGATIVE   Leukocytes,Ua NEGATIVE NEGATIVE   RBC / HPF 0-5 0 - 5 RBC/hpf   WBC, UA 0-5 0 - 5 WBC/hpf   Bacteria, UA RARE (A) NONE SEEN   Squamous Epithelial / LPF 0-5 0 - 5    Comment: Performed at [facility], [place] [address], [place], [place] [address]  Culture, Urine     Status: None   Collection Time: 01/19/20  9:12 AM   Specimen: Urine, Random  Result Value Ref Range   Specimen Description URINE, RANDOM    Special Requests NONE    Culture      NO GROWTH Performed at [facility], [place] [address] [place], [place] [address]    Report Status 01/20/2020 FINAL   Glucose, capillary     Status: Abnormal   Collection Time: 01/19/20 11:32 AM  Result Value Ref Range   Glucose-Capillary 109 (H) 70 - 99 mg/dL    Comment: Glucose reference range applies only to samples taken after fasting for at least 8 hours.  Glucose, capillary     Status: Abnormal   Collection Time: 01/19/20  4:09 PM  Result Value Ref Range   Glucose-Capillary 136 (H) 70 - 99 mg/dL    Comment: Glucose reference range applies only to samples taken after fasting for at least 8 hours.  Glucose, capillary     Status: Abnormal   Collection Time: 01/19/20  8:08 PM  Result Value Ref Range   Glucose-Capillary  146 (H) 70 - 99 mg/dL    Comment: Glucose reference range applies only to samples taken after fasting for at least 8 hours.  Glucose, capillary     Status: Abnormal   Collection Time: 01/20/20 12:24 AM  Result Value Ref Range   Glucose-Capillary 108 (H) 70 - 99 mg/dL    Comment: Glucose reference range applies only to samples taken after fasting for at least 8 hours.  CBC     Status: Abnormal   Collection Time: 01/20/20  2:20 AM  Result Value Ref Range   WBC 21.2 (H) 4.0 - 10.5 K/uL   RBC 3.11 (L) 3.87 - 5.11 MIL/uL   Hemoglobin 8.0 (L) 12.0 - 15.0 g/dL   HCT 24.4 (L) 36 - 46 %   MCV 78.5 (L) 80.0 - 100.0 fL   MCH 25.7 (L) 26.0 - 34.0 pg   MCHC 32.8 30.0 - 36.0 g/dL   RDW 19.9 (H) 11.5 - 15.5 %   Platelets 300 150 - 400 K/uL   nRBC 0.1 0.0 - 0.2 %    Comment: Performed at [facility] [address] [place], [place] [address]  Basic metabolic panel     Status: Abnormal   Collection Time: 01/20/20  2:20 AM  Result Value Ref Range   Sodium 141 135 - 145 mmol/L   Potassium 2.7 (LL) 3.5 - 5.1 mmol/L    Comment: CRITICAL RESULT CALLED TO, READ BACK BY AND VERIFIED  WITH: RN A LOWE @0315  01/20/20 BY S GEZAHEGN    Chloride 109 98 - 111 mmol/L   CO2 19 (L) 22 - 32 mmol/L   Glucose, Bld 146 (H) 70 - 99 mg/dL    Comment: Glucose reference range applies only to samples taken after fasting for at least 8 hours.   BUN 17 8 - 23 mg/dL   Creatinine, Ser 1.15 (H) 0.44 - 1.00 mg/dL   Calcium 7.8 (L) 8.9 - 10.3 mg/dL   GFR calc non Af Amer 44 (L) >60 mL/min   GFR calc Af Amer 51 (L) >60 mL/min   Anion gap 13 5 - 15    Comment: Performed at [facility] [address], [place], [place] [address]  Magnesium     Status: Abnormal   Collection Time: 01/20/20  2:20 AM  Result Value Ref Range   Magnesium 1.6 (L) 1.7 - 2.4 mg/dL    Comment: Performed at [facility] [address] [place], [place] [address]  Glucose, capillary     Status: Abnormal   Collection Time: 01/20/20   4:03 AM  Result Value Ref Range   Glucose-Capillary 130 (H) 70 - 99 mg/dL    Comment: Glucose reference range applies only to samples taken after fasting for at least 8 hours.  I-STAT, chem 8     Status: Abnormal   Collection Time: 01/20/20 11:36 AM  Result Value Ref Range   Sodium 144 135 - 145 mmol/L   Potassium 4.0 3.5 - 5.1 mmol/L   Chloride 115 (H) 98 - 111 mmol/L   BUN 21 8 - 23 mg/dL   Creatinine, Ser 0.90 0.44 - 1.00 mg/dL   Glucose, Bld 99 70 - 99 mg/dL    Comment: Glucose reference range applies only to samples taken after fasting for at least 8 hours.   Calcium, Ion 0.96 (L) 1.15 - 1.40 mmol/L   TCO2 20 (L) 22 - 32 mmol/L   Hemoglobin 10.9 (L) 12.0 - 15.0 g/dL   HCT 32.0 (L) 36 - 46 %    DG Chest 2 View  Result Date: 01/19/2020 CLINICAL DATA:  86 year old female with shortness of breath. Leukocytosis. EXAM: CHEST - 2 VIEW COMPARISON:  Chest radiographs 01/15/2020 and earlier. FINDINGS: AP and lateral views of the chest. Stable mild cardiomegaly. Visualized tracheal air column is within normal limits. Increased small bilateral pleural effusions, layering posteriorly on the lateral. Continued streaky additional bilateral lower lobe opacity. Although pulmonary vascularity appears only mildly increased since 2019. No air bronchograms. No pneumothorax. No acute osseous abnormality identified. Increased gas distended bowel loops in the upper abdomen. IMPRESSION: 1. Small but increased bilateral pleural effusions from recent radiographs. Streaky additional bilateral lower lobe opacity with some increased vascular congestion compared to 2019. Differential considerations include mild interstitial edema, pleural effusions with atelectasis, or infection. 2. Increased gas distended bowel in the visible abdomen since 01/15/2020. Electronically Signed   By: Genevie Ann [name]   On: 01/19/2020 10:27    ROS - all of the below systems have been reviewed with the patient and positives are indicated with  bold text General: chills, fever or night sweats Eyes: blurry vision or double vision ENT: epistaxis or sore throat Allergy/Immunology: itchy/watery eyes or nasal congestion Hematologic/Lymphatic: bleeding problems, blood clots or swollen lymph nodes Endocrine: temperature intolerance or unexpected weight changes Breast: new or changing breast lumps or nipple discharge Resp: cough, shortness of breath, or wheezing CV: chest pain or dyspnea on  exertion GI: as per HPI GU: dysuria, trouble voiding, or hematuria MSK: joint pain or joint stiffness Neuro: TIA or stroke symptoms Derm: pruritus and skin lesion changes Psych: anxiety and depression  PE Blood pressure (!) 102/51, pulse 68, temperature 98.4 F (36.9 C), temperature source Oral, resp. rate 20, height 5\' 1"  (1.549 m), weight 51.3 kg, SpO2 98 %. Constitutional: NAD; conversant; no deformities; frail, elderly Head: bilateral temporal wasting Eyes: Moist conjunctiva; no lid lag; anicteric; PERRL Neck: Trachea midline; no thyromegaly Lungs: Normal respiratory effort; no tactile fremitus CV: RRR; no palpable thrills; no pitting edema GI: Abd soft, nontender, nondistended; no palpable hepatosplenomegaly MSK: Normal gait; no clubbing/cyanosis Psychiatric: Appropriate affect; alert and oriented  Lymphatic: No palpable cervical or axillary lymphadenopathy Skin: no rash/edema/lesions; foam dressing over sacrum; has large unstageable sacral pressure injury, no cellulitis, some black eschar, black eschar gone on L upper buttock, eschar intact over L medial buttock, sacrum and Rt medial buttock. Can easily palpate coccyx, no obvious fluctuance/induration/abscess. See pic below      Results for orders placed or performed during the hospital encounter of 01/15/20 (from the past 48 hour(s))  Glucose, capillary     Status: Abnormal   Collection Time: 01/18/20  8:29 PM  Result Value Ref Range   Glucose-Capillary 120 (H) 70 - 99 mg/dL     Comment: Glucose reference range applies only to samples taken after fasting for at least 8 hours.  Glucose, capillary     Status: Abnormal   Collection Time: 01/19/20 12:13 AM  Result Value Ref Range   Glucose-Capillary 128 (H) 70 - 99 mg/dL    Comment: Glucose reference range applies only to samples taken after fasting for at least 8 hours.  Glucose, capillary     Status: Abnormal   Collection Time: 01/19/20  4:41 AM  Result Value Ref Range   Glucose-Capillary 129 (H) 70 - 99 mg/dL    Comment: Glucose reference range applies only to samples taken after fasting for at least 8 hours.  CBC     Status: Abnormal   Collection Time: 01/19/20  7:53 AM  Result Value Ref Range   WBC 21.8 (H) 4.0 - 10.5 K/uL   RBC 3.14 (L) 3.87 - 5.11 MIL/uL   Hemoglobin 8.4 (L) 12.0 - 15.0 g/dL   HCT 25.0 (L) 36 - 46 %   MCV 79.6 (L) 80.0 - 100.0 fL   MCH 26.8 26.0 - 34.0 pg   MCHC 33.6 30.0 - 36.0 g/dL   RDW 19.8 (H) 11.5 - 15.5 %   Platelets 285 150 - 400 K/uL   nRBC 0.0 0.0 - 0.2 %    Comment: Performed at [facility] [address], [place], [place] [address]  Comprehensive metabolic panel     Status: Abnormal   Collection Time: 01/19/20  7:53 AM  Result Value Ref Range   Sodium 138 135 - 145 mmol/L   Potassium 3.0 (L) 3.5 - 5.1 mmol/L   Chloride 109 98 - 111 mmol/L   CO2 19 (L) 22 - 32 mmol/L   Glucose, Bld 93 70 - 99 mg/dL    Comment: Glucose reference range applies only to samples taken after fasting for at least 8 hours.   BUN 17 8 - 23 mg/dL   Creatinine, Ser 1.12 (H) 0.44 - 1.00 mg/dL   Calcium 7.7 (L) 8.9 - 10.3 mg/dL   Total Protein 5.4 (L) 6.5 - 8.1 g/dL   Albumin 1.5 (L) 3.5 - 5.0  g/dL   AST 64 (H) 15 - 41 U/L   ALT 48 (H) 0 - 44 U/L   Alkaline Phosphatase 123 38 - 126 U/L   Total Bilirubin 1.0 0.3 - 1.2 mg/dL   GFR calc non Af Amer 45 (L) >60 mL/min   GFR calc Af Amer 53 (L) >60 mL/min   Anion gap 10 5 - 15    Comment: Performed at [facility] [address],  [place], [place] [address]  Glucose, capillary     Status: None   Collection Time: 01/19/20  7:57 AM  Result Value Ref Range   Glucose-Capillary 84 70 - 99 mg/dL    Comment: Glucose reference range applies only to samples taken after fasting for at least 8 hours.  Urinalysis, Routine w reflex microscopic Urine, Catheterized     Status: Abnormal   Collection Time: 01/19/20  9:12 AM  Result Value Ref Range   Color, Urine AMBER (A) YELLOW    Comment: BIOCHEMICALS MAY BE AFFECTED BY COLOR   APPearance CLEAR CLEAR   Specific Gravity, Urine 1.025 1.005 - 1.030   pH 5.0 5.0 - 8.0   Glucose, UA NEGATIVE NEGATIVE mg/dL   Hgb urine dipstick MODERATE (A) NEGATIVE   Bilirubin Urine NEGATIVE NEGATIVE   Ketones, ur NEGATIVE NEGATIVE mg/dL   Protein, ur 30 (A) NEGATIVE mg/dL   Nitrite NEGATIVE NEGATIVE   Leukocytes,Ua NEGATIVE NEGATIVE   RBC / HPF 0-5 0 - 5 RBC/hpf   WBC, UA 0-5 0 - 5 WBC/hpf   Bacteria, UA RARE (A) NONE SEEN   Squamous Epithelial / LPF 0-5 0 - 5    Comment: Performed at [facility], [place] [address], [place], [place] [address]  Culture, Urine     Status: None   Collection Time: 01/19/20  9:12 AM   Specimen: Urine, Random  Result Value Ref Range   Specimen Description URINE, RANDOM    Special Requests NONE    Culture      NO GROWTH Performed at [facility], [place] [address], [place], [place] [address]    Report Status 01/20/2020 FINAL   Glucose, capillary     Status: Abnormal   Collection Time: 01/19/20 11:32 AM  Result Value Ref Range   Glucose-Capillary 109 (H) 70 - 99 mg/dL    Comment: Glucose reference range applies only to samples taken after fasting for at least 8 hours.  Glucose, capillary     Status: Abnormal   Collection Time: 01/19/20  4:09 PM  Result Value Ref Range   Glucose-Capillary 136 (H) 70 - 99 mg/dL    Comment: Glucose reference range applies only to samples taken after fasting for at least 8 hours.  Glucose, capillary     Status: Abnormal    Collection Time: 01/19/20  8:08 PM  Result Value Ref Range   Glucose-Capillary 146 (H) 70 - 99 mg/dL    Comment: Glucose reference range applies only to samples taken after fasting for at least 8 hours.  Glucose, capillary     Status: Abnormal   Collection Time: 01/20/20 12:24 AM  Result Value Ref Range   Glucose-Capillary 108 (H) 70 - 99 mg/dL    Comment: Glucose reference range applies only to samples taken after fasting for at least 8 hours.  CBC     Status: Abnormal   Collection Time: 01/20/20  2:20 AM  Result Value Ref Range   WBC 21.2 (H) 4.0 - 10.5 K/uL  RBC 3.11 (L) 3.87 - 5.11 MIL/uL   Hemoglobin 8.0 (L) 12.0 - 15.0 g/dL   HCT 24.4 (L) 36 - 46 %   MCV 78.5 (L) 80.0 - 100.0 fL   MCH 25.7 (L) 26.0 - 34.0 pg   MCHC 32.8 30.0 - 36.0 g/dL   RDW 19.9 (H) 11.5 - 15.5 %   Platelets 300 150 - 400 K/uL   nRBC 0.1 0.0 - 0.2 %    Comment: Performed at [facility] [address] [place], [place] [address]  Basic metabolic panel     Status: Abnormal   Collection Time: 01/20/20  2:20 AM  Result Value Ref Range   Sodium 141 135 - 145 mmol/L   Potassium 2.7 (LL) 3.5 - 5.1 mmol/L    Comment: CRITICAL RESULT CALLED TO, READ BACK BY AND VERIFIED WITH: RN A LOWE @0315  01/20/20 BY S GEZAHEGN    Chloride 109 98 - 111 mmol/L   CO2 19 (L) 22 - 32 mmol/L   Glucose, Bld 146 (H) 70 - 99 mg/dL    Comment: Glucose reference range applies only to samples taken after fasting for at least 8 hours.   BUN 17 8 - 23 mg/dL   Creatinine, Ser 1.15 (H) 0.44 - 1.00 mg/dL   Calcium 7.8 (L) 8.9 - 10.3 mg/dL   GFR calc non Af Amer 44 (L) >60 mL/min   GFR calc Af Amer 51 (L) >60 mL/min   Anion gap 13 5 - 15    Comment: Performed at [facility] [address], [place], [place] [address]  Magnesium     Status: Abnormal   Collection Time: 01/20/20  2:20 AM  Result Value Ref Range   Magnesium 1.6 (L) 1.7 - 2.4 mg/dL    Comment: Performed at [facility] [address] [place],  [place] [address]  Glucose, capillary     Status: Abnormal   Collection Time: 01/20/20  4:03 AM  Result Value Ref Range   Glucose-Capillary 130 (H) 70 - 99 mg/dL    Comment: Glucose reference range applies only to samples taken after fasting for at least 8 hours.  I-STAT, chem 8     Status: Abnormal   Collection Time: 01/20/20 11:36 AM  Result Value Ref Range   Sodium 144 135 - 145 mmol/L   Potassium 4.0 3.5 - 5.1 mmol/L   Chloride 115 (H) 98 - 111 mmol/L   BUN 21 8 - 23 mg/dL   Creatinine, Ser 0.90 0.44 - 1.00 mg/dL   Glucose, Bld 99 70 - 99 mg/dL    Comment: Glucose reference range applies only to samples taken after fasting for at least 8 hours.   Calcium, Ion 0.96 (L) 1.15 - 1.40 mmol/L   TCO2 20 (L) 22 - 32 mmol/L   Hemoglobin 10.9 (L) 12.0 - 15.0 g/dL   HCT 32.0 (L) 36 - 46 %    DG Chest 2 View  Result Date: 01/19/2020 CLINICAL DATA:  86 year old female with shortness of breath. Leukocytosis. EXAM: CHEST - 2 VIEW COMPARISON:  Chest radiographs 01/15/2020 and earlier. FINDINGS: AP and lateral views of the chest. Stable mild cardiomegaly. Visualized tracheal air column is within normal limits. Increased small bilateral pleural effusions, layering posteriorly on the lateral. Continued streaky additional bilateral lower lobe opacity. Although pulmonary vascularity appears only mildly increased since 2019. No air bronchograms. No pneumothorax. No acute osseous abnormality identified. Increased gas distended bowel loops in the upper abdomen. IMPRESSION:  1. Small but increased bilateral pleural effusions from recent radiographs. Streaky additional bilateral lower lobe opacity with some increased vascular congestion compared to 2019. Differential considerations include mild interstitial edema, pleural effusions with atelectasis, or infection. 2. Increased gas distended bowel in the visible abdomen since 01/15/2020. Electronically Signed   By: Genevie Ann [name]   On: 01/19/2020 10:27    Imaging: Reviewed  CT  A/P: CHANNON BROUGHER is an 86 y.o. female with  Sacral decubitus wound-unstageable Severe failure to thrive, Severe protein calorie malnutrition-prealbumin 5.7, albumin 1.5 Anemia Leukocytosis Klebsiella UTI CHF Paroxysmal atrial fibrillation Diabetes mellitus type 2  On her CT abdomen pelvis there is no evidence of abscess in the soft tissue on imaging.  On physical exam I do not appreciate any fluctuance, induration or abscess.  While she does have a black eschar over part of the sacrum there is another part where it has become denuded and the tissue in that location is nonnecrotic and I think that is what we would find underneath that black eschar.  There is literally no soft tissue overlying her coccyx except for the skin.  I do not think her leukocytosis is due to the sacral wound  I am not sure patient would benefit from debridement as it would probably become a nonhealing chronic wound  Continue wound care as recommended by wound care nurse  We will reevaluate it on Monday  Macel Yearsley M. Redmond Pulling, MD, FACS General, Bariatric, & Minimally Invasive Surgery [facility], [place]

## 2020-01-20 NOTE — Progress Notes (Addendum)
We will change the levaquin to ceftriaxone today after talking with Dr. Broadus John to complete 7d of total therapy.   Onnie Boer, PharmD, BCIDP, AAHIVP, CPP Infectious Disease Pharmacist 01/20/2020 9:25 AM

## 2020-01-20 NOTE — Progress Notes (Signed)
Patient bladder scanned for 609.Dr.Rathore notified. Order received for in and out cath x one.

## 2020-01-21 ENCOUNTER — Inpatient Hospital Stay (HOSPITAL_COMMUNITY): Payer: HMO

## 2020-01-21 ENCOUNTER — Encounter (HOSPITAL_COMMUNITY): Payer: Self-pay | Admitting: Pulmonary Disease

## 2020-01-21 DIAGNOSIS — D72829 Elevated white blood cell count, unspecified: Secondary | ICD-10-CM

## 2020-01-21 LAB — COMPREHENSIVE METABOLIC PANEL
ALT: 43 U/L (ref 0–44)
AST: 68 U/L — ABNORMAL HIGH (ref 15–41)
Albumin: 1.4 g/dL — ABNORMAL LOW (ref 3.5–5.0)
Alkaline Phosphatase: 114 U/L (ref 38–126)
Anion gap: 13 (ref 5–15)
BUN: 14 mg/dL (ref 8–23)
CO2: 20 mmol/L — ABNORMAL LOW (ref 22–32)
Calcium: 7.6 mg/dL — ABNORMAL LOW (ref 8.9–10.3)
Chloride: 111 mmol/L (ref 98–111)
Creatinine, Ser: 1.03 mg/dL — ABNORMAL HIGH (ref 0.44–1.00)
GFR calc Af Amer: 58 mL/min — ABNORMAL LOW (ref >60)
GFR calc non Af Amer: 50 mL/min — ABNORMAL LOW (ref >60)
Glucose, Bld: 105 mg/dL — ABNORMAL HIGH (ref 70–99)
Potassium: 2.7 mmol/L — CL (ref 3.5–5.1)
Sodium: 144 mmol/L (ref 135–145)
Total Bilirubin: 0.8 mg/dL (ref 0.3–1.2)
Total Protein: 4.9 g/dL — ABNORMAL LOW (ref 6.5–8.1)

## 2020-01-21 LAB — GLUCOSE, CAPILLARY
Glucose-Capillary: 103 mg/dL — ABNORMAL HIGH (ref 70–99)
Glucose-Capillary: 128 mg/dL — ABNORMAL HIGH (ref 70–99)
Glucose-Capillary: 160 mg/dL — ABNORMAL HIGH (ref 70–99)
Glucose-Capillary: 196 mg/dL — ABNORMAL HIGH (ref 70–99)
Glucose-Capillary: 97 mg/dL (ref 70–99)

## 2020-01-21 LAB — CORTISOL: Cortisol, Plasma: 27.4 ug/dL

## 2020-01-21 LAB — CBC
HCT: 23.8 % — ABNORMAL LOW (ref 36.0–46.0)
Hemoglobin: 7.7 g/dL — ABNORMAL LOW (ref 12.0–15.0)
MCH: 25.8 pg — ABNORMAL LOW (ref 26.0–34.0)
MCHC: 32.4 g/dL (ref 30.0–36.0)
MCV: 79.9 fL — ABNORMAL LOW (ref 80.0–100.0)
Platelets: 280 10*3/uL (ref 150–400)
RBC: 2.98 MIL/uL — ABNORMAL LOW (ref 3.87–5.11)
RDW: 20.5 % — ABNORMAL HIGH (ref 11.5–15.5)
WBC: 22.5 10*3/uL — ABNORMAL HIGH (ref 4.0–10.5)
nRBC: 0.3 % — ABNORMAL HIGH (ref 0.0–0.2)

## 2020-01-21 LAB — LACTIC ACID, PLASMA: Lactic Acid, Venous: 1.6 mmol/L (ref 0.5–1.9)

## 2020-01-21 LAB — TROPONIN I (HIGH SENSITIVITY): Troponin I (High Sensitivity): 55 ng/L — ABNORMAL HIGH (ref <18)

## 2020-01-21 LAB — BRAIN NATRIURETIC PEPTIDE: B Natriuretic Peptide: 342.8 pg/mL — ABNORMAL HIGH (ref 0.0–100.0)

## 2020-01-21 LAB — MAGNESIUM: Magnesium: 1.6 mg/dL — ABNORMAL LOW (ref 1.7–2.4)

## 2020-01-21 MED ORDER — APIXABAN 2.5 MG PO TABS
2.5000 mg | ORAL_TABLET | Freq: Two times a day (BID) | ORAL | Status: DC
  Administered 2020-01-21 – 2020-01-22 (×4): 2.5 mg via ORAL
  Filled 2020-01-21 (×4): qty 1

## 2020-01-21 MED ORDER — IOHEXOL 9 MG/ML PO SOLN
ORAL | Status: AC
  Administered 2020-01-21: 500 mL via ORAL
  Filled 2020-01-21: qty 500

## 2020-01-21 MED ORDER — IOHEXOL 300 MG/ML  SOLN
75.0000 mL | Freq: Once | INTRAMUSCULAR | Status: AC | PRN
  Administered 2020-01-21: 75 mL via INTRAVENOUS

## 2020-01-21 MED ORDER — POTASSIUM CHLORIDE CRYS ER 20 MEQ PO TBCR
40.0000 meq | EXTENDED_RELEASE_TABLET | ORAL | Status: AC
  Administered 2020-01-21 (×2): 40 meq via ORAL
  Filled 2020-01-21 (×2): qty 2

## 2020-01-21 MED ORDER — FUROSEMIDE 10 MG/ML IJ SOLN
20.0000 mg | Freq: Once | INTRAMUSCULAR | Status: AC
  Administered 2020-01-21: 20 mg via INTRAVENOUS
  Filled 2020-01-21: qty 2

## 2020-01-21 MED ORDER — MIDODRINE HCL 5 MG PO TABS
10.0000 mg | ORAL_TABLET | Freq: Three times a day (TID) | ORAL | Status: DC
  Administered 2020-01-21 – 2020-01-22 (×5): 10 mg via ORAL
  Filled 2020-01-21 (×5): qty 2

## 2020-01-21 MED ORDER — MAGNESIUM SULFATE 2 GM/50ML IV SOLN
2.0000 g | Freq: Once | INTRAVENOUS | Status: AC
  Administered 2020-01-21: 2 g via INTRAVENOUS
  Filled 2020-01-21: qty 50

## 2020-01-21 MED ORDER — BOOST / RESOURCE BREEZE PO LIQD CUSTOM
1.0000 | Freq: Three times a day (TID) | ORAL | Status: DC
  Administered 2020-01-21 (×3): 1 via ORAL

## 2020-01-21 MED ORDER — POTASSIUM CHLORIDE CRYS ER 20 MEQ PO TBCR
40.0000 meq | EXTENDED_RELEASE_TABLET | ORAL | Status: DC
  Administered 2020-01-21: 40 meq via ORAL
  Filled 2020-01-21: qty 2

## 2020-01-21 MED ORDER — IOHEXOL 9 MG/ML PO SOLN
ORAL | Status: AC
  Filled 2020-01-21: qty 500

## 2020-01-21 MED ORDER — IOHEXOL 9 MG/ML PO SOLN: 500.0000 mL | ORAL | Status: AC

## 2020-01-21 NOTE — Progress Notes (Addendum)
Overnight event  Patient with history of CKD stage III, A. fib, chronic diastolic CHF, hypertension, hyperlipidemia, diabetes admitted for worsening weakness, severe failure to thrive, weight loss, debility, severe hypoalbuminemia, anemia, multifocal pneumonia, Klebsiella UTI, unstageable sacral decubitus wound, metabolic encephalopathy.  She is being treated with ceftriaxone for the UTI.  She is on midodrine for hypotension.  Paged by nursing staff due to concern for hypotension-blood pressure 81/45.  Rapid response was called.  Patient was seen and examined at bedside. Appeared lethargic. Complained of lower substernal/epigastric abdominal pain.  Denied dizziness or shortness of breath.  Vitals: Temperature 98.5, heart rate 113, blood pressure 106/57, respiratory rate 16, SPO2 91% on room air.  CBG 103.  Heart: RRR Lungs: Equal air entry bilaterally.  No rales or wheezing appreciated. Abdomen: Soft.  Bowel sounds present.  Not distended.  Diffusely tender to palpation with guarding.  No rebound or rigidity. Extremities: No edema Neuro: Awake and alert.  Following commands and moving all extremities.  No focal motor or sensory deficit.  EKG: A. fib, heart rate 99.  QTc 580.  ST/ T wave abnormality in inferior and lateral leads.  No significant change compared to prior tracings except rate has now increased and QT interval prolonged.  Plan -Stat chest x-ray, KUB -Supplemental oxygen as needed to keep oxygen saturation above 92% -Stat labs: CBC, BMP, lactate, BNP, troponin -Keep potassium above 4 and magnesium above given QT prolongation on EKG.  Repeat EKG in a.m.  Update: -BNP slightly elevated at 342. Chest x-ray showing mild interstitial pulmonary edema. Patient is currently satting 92% on room air without signs of respiratory distress. Will hold off giving IV Lasix since blood pressure is currently soft. Please order Lasix after blood pressure improves. -Troponin mildly elevated at 55  was 26 a few days ago. EKG does not appear significantly changed from prior tracings. Check second set of troponin. Recent echo done 01/16/2020 without regional wall motion abnormalities. -Does have leukocytosis with WBC count 22.5 but stable compared to prior labs. Lactate normal. She is currently on ceftriaxone for UTI. -Hemoglobin 7.7, appears stable. No signs of active bleeding. Hemoglobin was 10.9 yesterday, however, previously in the 7-8 range. -Potassium 2.7, magnesium 1.6 ---> supplementation ordered -KUB showing increased mildly dilated air-filled loops of small bowel which could be due to ileus versus early partial SBO. Discussed with nursing staff -patient has not vomiting and her last bowel movement was yesterday. CT abdomen/pelvis without contrast ordered for further evaluation and currently pending.

## 2020-01-21 NOTE — Significant Event (Addendum)
Rapid Response Event Note   Reason for Call :  Lethargy, SBP-80s, SpO2-91%(lower than has been) Initial Focused Assessment:  Pt laying in bed with eyes open, in no distress. Pt is alert to self and place. Pt responds appropriately to questions, follows commands, and moves all extremities. Pt c/o chest/abdominal pain over epigastric region, denies SOB.  Pupils 2 and brisk. Lungs diminished with scattered crackles.  Abdomen distended but soft. Skin warm and dry. T-98.5, HR-113, BP-106/57, RR-16, SpO2-91% on RA, CBG-103.    Interventions:  EKG-Afib with prolonged qtc Trop x1  PCXR/ABD xray LA  Plan of Care:  Pt seems very weak/tired. BP WNL right now. Initiate MD orders and continue to monitor closely. Call RRT if further assistance needed.    Event Summary:   MD Notified: Dr. Marlowe Sax at bedside on my arrival [place], Laquisha Northcraft Anderson, RN

## 2020-01-21 NOTE — Progress Notes (Signed)
[place] for Eliquis  Indication: atrial fibrillation  Allergies  Allergen Reactions  . Codeine Nausea Only  . Metformin Diarrhea  . Amoxicillin Rash  . Biaxin [Clarithromycin] Rash  . Ciprofloxacin Rash  . Penicillins Rash    Childhood allergic reaction Has patient had a PCN reaction causing immediate rash, facial/tongue/throat swelling, SOB or lightheadedness with hypotension: Yes Has patient had a PCN reaction causing severe rash involving mucus membranes or skin necrosis: No Has patient had a PCN reaction that required hospitalization: Yes Has patient had a PCN reaction occurring within the last 10 years: No If all of the above answers are "NO", then may proceed with Cephalosporin use.    Patient Measurements: Height: 5\' 1"  (154.9 cm) Weight: 51.2 kg (112 lb 14 oz) IBW/kg (Calculated) : 47.8  Vital Signs: Temp: 99.1 F (37.3 C) (08/08 0625) Temp Source: Oral (08/08 0625) BP: 104/43 (08/08 0625) Pulse Rate: 65 (08/08 0625)  Labs: Recent Labs    01/19/20 0753 01/19/20 0753 01/20/20 0220 01/20/20 0220 01/20/20 1136 01/21/20 0300  HGB 8.4*   < > 8.0*   < > 10.9* 7.7*  HCT 25.0*   < > 24.4*  --  32.0* 23.8*  PLT 285  --  300  --   --  280  CREATININE 1.12*   < > 1.15*  --  0.90 1.03*  TROPONINIHS  --   --   --   --   --  55*   < > = values in this interval not displayed.    Estimated Creatinine Clearance: 31.2 mL/min (A) (by C-G formula based on SCr of 1.03 mg/dL (H)).   Medical History: Past Medical History:  Diagnosis Date  . Anxiety   . Arthritis   . Asthma   . Atypical atrial flutter ([place])   . Cataract   . Chronic diastolic CHF (congestive heart failure) ([place])   . Fibromyalgia   . GERD (gastroesophageal reflux disease)   . History of blood transfusion   . Hyperlipemia   . Hypertension   . IBS (irritable bowel syndrome)   . Seasonal allergies   . Stroke [facility]) 2013   TIA  . Tubular adenoma of colon   . Type  2 diabetes mellitus ([facility])     Medications:  No current facility-administered medications on file prior to encounter.   Current Outpatient Medications on File Prior to Encounter  Medication Sig Dispense Refill  . acetaminophen (TYLENOL) 325 MG tablet Take 650 mg by mouth every 6 (six) hours as needed for moderate pain.     Marland Kitchen albuterol (PROVENTIL HFA;VENTOLIN HFA) 108 (90 BASE) MCG/ACT inhaler Inhale 2 puffs into the lungs every 6 (six) hours as needed for wheezing or shortness of breath.     Marland Kitchen amiodarone (PACERONE) 200 MG tablet TAKE 1 TABLET BY MOUTH TWICE DAILY (Patient taking differently: Take 200 mg by mouth 2 (two) times daily. ) 180 tablet 2  . amLODipine (NORVASC) 5 MG tablet Take 5 mg by mouth daily.     Marland Kitchen atorvastatin (LIPITOR) 40 MG tablet Take 40 mg by mouth daily.    Marland Kitchen ELIQUIS 2.5 MG TABS tablet TAKE 1 TABLET(2.5 MG) BY MOUTH TWICE DAILY (Patient taking differently: Take 2.5 mg by mouth 2 (two) times daily. ) 60 tablet 3  . EPINEPHrine (EPI-PEN) 0.3 mg/0.3 mL SOAJ injection Inject 0.3 mg once as needed into the muscle (severe allergic reaction).     . gabapentin (NEURONTIN) 100 MG capsule Take 1  capsule (100 mg total) by mouth 3 (three) times daily. 60 capsule 0  . ibandronate (BONIVA) 150 MG tablet Take 150 mg by mouth every 30 (thirty) days.    . mometasone (NASONEX) 50 MCG/ACT nasal spray Place 2 sprays into the nose daily as needed (nasal congestion).     . Propylene Glycol (SYSTANE BALANCE OP) Place 1 drop daily as needed into both eyes (dryness).     . solifenacin (VESICARE) 5 MG tablet Take 5 mg by mouth daily.    Marland Kitchen trimethoprim-polymyxin b (POLYTRIM) ophthalmic solution Place 1 drop into the left eye 4 (four) times daily.  12  . LINZESS 290 MCG CAPS capsule TAKE 1 CAPSULE BY MOUTH DAILY BEFORE BREAKFAST (Patient not taking: Reported on 01/16/2020) 90 capsule 0  . methocarbamol (ROBAXIN) 500 MG tablet Take 1 tablet (500 mg total) every 6 (six) hours as needed by mouth for muscle  spasms. (Patient not taking: Reported on 01/16/2020) 15 tablet 0     Assessment: 86 y.o. female admitted with hypotension/sepsis, h/o Afib/flutter. Have been holding Eliquis for colonoscopy. After procedure yesterday (8/7) GI found entire colon to be normal with no further GI testing recommended. Discussed plan for resuming Eliquis with Dr. Broadus John - resuming Eliquis today.   Plan:  Eliquis 2.5 mg BID Monitor CBC, s/sx bleeding Pharmacy will continue to monitor peripherally.   Kerry Barnes, PharmD PGY1 Pharmacy Resident 01/21/2020 7:50 AM  Please check [email or web address] for unit-specific pharmacy phone numbers.

## 2020-01-21 NOTE — Progress Notes (Signed)
Patient with change in VS making pt a MEWS of Yellow. Changes were seen in previous VS. Discussed with Annamary Carolin, and MD nor RR required notification @ this time. As VS were checked, according to protocol, patient became a MEWS of Red. Mindy,RR RN notified and came to see patient. Dr.Rathore on unit and was notified face to face and saw patient. Orders written and carried out. Patient improved and returned to a [place].

## 2020-01-21 NOTE — Progress Notes (Signed)
PROGRESS NOTE    Kerry Barnes  FKC:[id number] DOB: July 25, 1933 DOA: 01/15/2020 PCP: Aletha Halim., PA-C  Brief Narrative: 86 year old female with PMHx of CKD III, atrial fibrillation on Eliquis, chronic diastolic heart failure, hypertension, hyperlipidemia, and diabetes mellitus presenting for worsening weakness, FTT noted to be hypotensive in the ED, admitted to ICU treated with IV fluids, Abx, blood pressure stabilized, CT abdomen pelvis noted lower lobe consolidation and changing caliber of the colon suggesting possible focal lesion -Transferred to [facility] service 8/4 -noted to have severe hypoalbuminemia, unstagable sacral wound, UTI, Gi consulted, colonoscopy planned, continues to have failure to thrive    Assessment & Plan:   Severe failure to thrive, weight loss, debility, severe hypoalbuminemia, anemia -Spouse reports decline for atleast 6-12 months, malignancy, severe malnutrition, liver disease are considerations -GI following, colonoscopy 8/7 was normal, may need EGD -Will also obtain CT chest with contrast -Right upper quadrant ultrasound noted abnormal liver echotexture?  Fatty liver, I wonder if she has cirrhosis, elastography not done on inpatient ultrasounds, HCV negative -Ferritin is significantly elevated raising consideration for hemochromatosis, but could purely be acute phase reactant -TSH is unremarkable -Prognosis is poor in the setting of ongoing debility, bedbound state, severe hypoalbuminemia, sacral decubitus ulcers etc., discussed poor prognosis with patient's spouse and patient, recommended consideration of DNR, patient would like to be full code with full scope of treatment  Multifocal pneumonia/bilateral pleural effusions -CT on admission noted bilateral lower lobe consolidation -Treated with IV Levaquin in the ICU, change to ceftriaxone for better coverage for UTI/wound -Blood cultures negative -SLP eval completed, no concern for aspiration -Urine  Legionella and strep pneumo antigen are negative  -Wean O2  Klebsiella UTI Recurrent urinary retention -Continue ceftriaxone for at least 2 more days, continues to have profound leukocytosis -Foley catheter placed 8/7 due to recurrent urinary retention, also has a necrotic sacral decubitus wound  Unstageable sacral decubitus wound -With black eschar , no abscess on CT noted -Wound nurse following -Seen by general surgery, recommended local wound care  Metabolic encephalopathy -In the setting of infection, gabapentin -Mental status with slow improvement, gabapentin discontinued   Iron deficiency anemia -Anemia panel with iron deficiency and significantly elevated ferritin -GI consulted, given IV iron x1 8/5 -Colonoscopy being reattempted today -Peripheral smear noted microcytic anemia, hemoglobin electrophoresis is unremarkable -LDH is normal  Hypotension -Mild, may be chronic, maps greater than 60 -Clinically do not suspect sepsis -Blood cultures are negative -Started on midodrine, increased dose  Paroxysmal atrial fibrillation -In sinus rhythm at this time, Eliquis on hold -Continue amiodarone   Acute on chronic diastolic CHF -Echo notes diastolic dysfunction, preserved EF and moderate mitral regurgitation -Appears slightly fluid overloaded -Lasix x 1 today, blood pressure soft, monitor  Hypokalemia -Due to GI losses from prep and Lasix -Replaced  Type 2 diabetes mellitus -Januvia on hold, hba1c is 5.1 -Sliding scale insulin for now  Goal of care discussion -Poor prognosis discussed with patient and spouse 8/6 and 8/7, recommended consideration of DNR, patient would like to be a full code with full scope of treatment -Complete above work-up, may need palliative care discussions soon  DVT prophylaxis: Eliquis on hold Code Status: Full code Family Communication: Spouse at bedside 8/6, 8/7, left messages for daughter Malaquias Lenker Art Disposition Plan:  Status is:  Inpatient  Remains inpatient appropriate because:Inpatient level of care appropriate due to severity of illness   Dispo: The patient is from: Home  Anticipated d/c is to: SNF              Anticipated d/c date is: Pending above work-up              Patient currently is not medically stable to d/c.  Consultants:   PCCM transfer  Gastroenterology   Procedures:   Antimicrobials:  Levaquin  Subjective: -Overnight was hypotensive, given fluid bolus, also had another CT abdomen which was concerning for ileus  Objective: Vitals:   01/21/20 0406 01/21/20 0511 01/21/20 0625 01/21/20 0905  BP: (!) 108/43 (!) 103/44 (!) 104/43 (!) 110/48  Pulse: 65 64 65 66  Resp: 14 16 14 16   Temp: 99.6 F (37.6 C) 99.3 F (37.4 C) 99.1 F (37.3 C) 98.2 F (36.8 C)  TempSrc: Oral Oral Oral Oral  SpO2: 92% 92% 93% 94%  Weight:      Height:        Intake/Output Summary (Last 24 hours) at 01/21/2020 1142 Last data filed at 01/21/2020 0900 Gross per 24 hour  Intake 340 ml  Output 1545 ml  Net -1205 ml   Filed Weights   01/19/20 0500 01/19/20 2007 01/20/20 2118  Weight: 51 kg 51.3 kg 51.2 kg    Examination:  General exam: Chronically ill elderly extremely cachectic female sitting up in bed, awake alert oriented to self and place, flat affect HEENT: Positive JVD CVS: S1-S2, regular rate rhythm Lungs: Decreased breath sounds the bases otherwise clear Abdomen: Soft, nontender, bowel sounds present Extremities: Trace edema  Extremities: Trace edema  Skin: 4 x 4 centimeter sacral wound with yellow slough and gray/black eschar Psychiatry: Flat affect    Data Reviewed:   CBC: Recent Labs  Lab 01/15/20 1901 01/15/20 1901 01/16/20 0517 01/16/20 0517 01/17/20 0456 01/17/20 0456 01/18/20 0325 01/19/20 0753 01/20/20 0220 01/20/20 1136 01/21/20 0300  WBC 20.3*   < > 17.3*   < > 18.4*  --  16.9* 21.8* 21.2*  --  22.5*  NEUTROABS 17.6*  --  15.4*  --   --   --   --   --    --   --   --   HGB 10.5*   < > 7.7*   < > 7.8*   < > 8.1* 8.4* 8.0* 10.9* 7.7*  HCT 32.2*   < > 24.1*   < > 24.0*   < > 24.5* 25.0* 24.4* 32.0* 23.8*  MCV 80.7   < > 80.3   < > 79.2*  --  79.5* 79.6* 78.5*  --  79.9*  PLT 334   < > 268   < > 250  --  313 285 300  --  280   < > = values in this interval not displayed.   Basic Metabolic Panel: Recent Labs  Lab 01/15/20 1901 01/16/20 0946 01/17/20 0456 01/17/20 0456 01/18/20 0325 01/19/20 0753 01/20/20 0220 01/20/20 1136 01/21/20 0300  NA 133*   < > 135   < > 134* 138 141 144 144  K 4.2   < > 3.6   < > 3.4* 3.0* 2.7* 4.0 2.7*  CL 101   < > 108   < > 106 109 109 115* 111  CO2 23   < > 19*  --  21* 19* 19*  --  20*  GLUCOSE 101*   < > 86   < > 106* 93 146* 99 105*  BUN 22   < > 11   < > 13 17 17  21 14  CREATININE 1.18*   < > 0.82   < > 0.91 1.12* 1.15* 0.90 1.03*  CALCIUM 8.3*   < > 7.2*  --  7.1* 7.7* 7.8*  --  7.6*  MG 1.9  --   --   --   --   --  1.6*  --  1.6*  PHOS 2.7  --   --   --   --   --   --   --   --    < > = values in this interval not displayed.   GFR: Estimated Creatinine Clearance: 31.2 mL/min (A) (by C-G formula based on SCr of 1.03 mg/dL (H)). Liver Function Tests: Recent Labs  Lab 01/16/20 0946 01/17/20 0456 01/18/20 0325 01/19/20 0753 01/21/20 0300  AST 68* 73* 69* 64* 68*  ALT 53* 55* 51* 48* 43  ALKPHOS 60 73 88 123 114  BILITOT 1.5* 1.8* 1.4* 1.0 0.8  PROT 4.5* 4.7* 5.1* 5.4* 4.9*  ALBUMIN 1.4* 1.4* 1.4* 1.5* 1.4*   No results for input(s): LIPASE, AMYLASE in the last 168 hours. Recent Labs  Lab 01/15/20 2237  AMMONIA 21   Coagulation Profile: Recent Labs  Lab 01/16/20 0946  INR 1.5*   Cardiac Enzymes: Recent Labs  Lab 01/15/20 2337  CKTOTAL 534*   BNP (last 3 results) No results for input(s): PROBNP in the last 8760 hours. HbA1C: No results for input(s): HGBA1C in the last 72 hours. CBG: Recent Labs  Lab 01/20/20 1624 01/20/20 2014 01/21/20 0024 01/21/20 0415  01/21/20 1122  GLUCAP 85 112* 103* 97 128*   Lipid Profile: No results for input(s): CHOL, HDL, LDLCALC, TRIG, CHOLHDL, LDLDIRECT in the last 72 hours. Thyroid Function Tests: No results for input(s): TSH, T4TOTAL, FREET4, T3FREE, THYROIDAB in the last 72 hours. Anemia Panel: No results for input(s): VITAMINB12, FOLATE, FERRITIN, TIBC, IRON, RETICCTPCT in the last 72 hours. Urine analysis:    Component Value Date/Time   COLORURINE AMBER (A) 01/19/2020 0912   APPEARANCEUR CLEAR 01/19/2020 0912   LABSPEC 1.025 01/19/2020 0912   PHURINE 5.0 01/19/2020 0912   GLUCOSEU NEGATIVE 01/19/2020 0912   HGBUR MODERATE (A) 01/19/2020 0912   BILIRUBINUR NEGATIVE 01/19/2020 0912   KETONESUR NEGATIVE 01/19/2020 0912   PROTEINUR 30 (A) 01/19/2020 0912   UROBILINOGEN 0.2 09/08/2010 1330   NITRITE NEGATIVE 01/19/2020 0912   LEUKOCYTESUR NEGATIVE 01/19/2020 0912   Sepsis Labs: @LABRCNTIP (procalcitonin:4,lacticidven:4)  ) Recent Results (from the past 240 hour(s))  SARS Coronavirus 2 by RT PCR (hospital order, performed in [facility] hospital lab) Nasopharyngeal Nasopharyngeal Swab     Status: None   Collection Time: 01/15/20  7:00 PM   Specimen: Nasopharyngeal Swab  Result Value Ref Range Status   SARS Coronavirus 2 NEGATIVE NEGATIVE Final    Comment: (NOTE) SARS-CoV-2 target nucleic acids are NOT DETECTED.  The SARS-CoV-2 RNA is generally detectable in upper and lower respiratory specimens during the acute phase of infection. The lowest concentration of SARS-CoV-2 viral copies this assay can detect is 250 copies / mL. A negative result does not preclude SARS-CoV-2 infection and should not be used as the sole basis for treatment or other patient management decisions.  A negative result may occur with improper specimen collection / handling, submission of specimen other than nasopharyngeal swab, presence of viral mutation(s) within the areas targeted by this assay, and inadequate number of  viral copies (<250 copies / mL). A negative result must be combined with clinical observations, patient history, and epidemiological  information.  Fact Sheet for Patients:   StrictlyIdeas.no  Fact Sheet for Healthcare Providers: BankingDealers.co.za  This test is not yet approved or  cleared by the [place] FDA and has been authorized for detection and/or diagnosis of SARS-CoV-2 by FDA under an Emergency Use Authorization (EUA).  This EUA will remain in effect (meaning this test can be used) for the duration of the COVID-19 declaration under Section 564(b)(1) of the Act, 21 U.S.C. section 360bbb-3(b)(1), unless the authorization is terminated or revoked sooner.  Performed at [facility], [place] [address] [place], [place] [address]   Culture, blood (Routine x 2)     Status: None   Collection Time: 01/15/20  7:01 PM   Specimen: BLOOD  Result Value Ref Range Status   Specimen Description BLOOD SITE NOT SPECIFIED  Final   Special Requests   Final    BOTTLES DRAWN AEROBIC AND ANAEROBIC Blood Culture adequate volume   Culture   Final    NO GROWTH 5 DAYS Performed at [facility], [place] [address], [place], [place] [address]    Report Status 01/20/2020 FINAL  Final  Culture, blood (Routine x 2)     Status: None   Collection Time: 01/15/20  7:48 PM   Specimen: BLOOD RIGHT FOREARM  Result Value Ref Range Status   Specimen Description BLOOD RIGHT FOREARM  Final   Special Requests   Final    BOTTLES DRAWN AEROBIC ONLY Blood Culture results may not be optimal due to an inadequate volume of blood received in culture bottles   Culture   Final    NO GROWTH 5 DAYS Performed at [facility], [place] [address], [place], [place] [address]    Report Status 01/20/2020 FINAL  Final  Urine culture     Status: Abnormal   Collection Time: 01/15/20  7:55 PM   Specimen: Urine, Random  Result Value Ref Range Status   Specimen  Description URINE, RANDOM  Final   Special Requests   Final    NONE Performed at [facility], [place] [address], [place], [place] [address]    Culture >=100,000 COLONIES/mL KLEBSIELLA PNEUMONIAE (A)  Final   Report Status 01/18/2020 FINAL  Final   Organism ID, Bacteria KLEBSIELLA PNEUMONIAE (A)  Final      Susceptibility   Klebsiella pneumoniae - MIC*    AMPICILLIN RESISTANT Resistant     CEFAZOLIN <=4 SENSITIVE Sensitive     CEFTRIAXONE <=0.25 SENSITIVE Sensitive     CIPROFLOXACIN <=0.25 SENSITIVE Sensitive     GENTAMICIN <=1 SENSITIVE Sensitive     IMIPENEM <=0.25 SENSITIVE Sensitive     NITROFURANTOIN 32 SENSITIVE Sensitive     TRIMETH/SULFA <=20 SENSITIVE Sensitive     AMPICILLIN/SULBACTAM <=2 SENSITIVE Sensitive     PIP/TAZO <=4 SENSITIVE Sensitive     * >=100,000 COLONIES/mL KLEBSIELLA PNEUMONIAE  MRSA PCR Screening     Status: None   Collection Time: 01/16/20  5:41 AM   Specimen: Nasal Mucosa; Nasopharyngeal  Result Value Ref Range Status   MRSA by PCR NEGATIVE NEGATIVE Final    Comment:        The GeneXpert MRSA Assay (FDA approved for NASAL specimens only), is one component of a comprehensive MRSA colonization surveillance program. It is not intended to diagnose MRSA infection nor to guide or monitor treatment for MRSA infections. Performed at [facility], [place] [address] [place], [place] [address]   Culture, Urine  Status: None   Collection Time: 01/19/20  9:12 AM   Specimen: Urine, Random  Result Value Ref Range Status   Specimen Description URINE, RANDOM  Final   Special Requests NONE  Final   Culture   Final    NO GROWTH Performed at [facility], [address], [place],  [address]    Report Status 01/20/2020 FINAL  Final         Radiology Studies: CT ABDOMEN PELVIS WO CONTRAST  Result Date: 01/21/2020 CLINICAL DATA:  Diffuse abdominal pain and distention. Bowel obstruction suspected. Inpatient. EXAM: CT ABDOMEN AND  PELVIS WITHOUT CONTRAST TECHNIQUE: Multidetector CT imaging of the abdomen and pelvis was performed following the standard protocol without IV contrast. COMPARISON:  01/15/2020 CT abdomen/pelvis. Abdominal radiograph from earlier today. FINDINGS: Lower chest: Small dependent bilateral pleural effusions, new. Patchy consolidation and volume loss at the dependent lung bases bilaterally, most prominent in the left lower lobe, slightly worsened. Low cardiac blood pool density indicates anemia. Hepatobiliary: Normal liver size. No liver mass. Cholecystectomy. Bile ducts are stable and within normal post cholecystectomy limits. CBD diameter 6 mm. Pancreas: Normal, with no mass or duct dilation. Spleen: Normal size. No mass. Adrenals/Urinary Tract: Normal adrenals. No hydronephrosis. Punctate nonobstructing lower left renal stone. No right renal stones. No contour deforming renal masses. Bladder decompressed by indwelling Foley catheter. Stomach/Bowel: Normal non-distended stomach. Borderline mild diffuse dilatation of the small bowel up to 3.0 cm diameter. No small bowel wall thickening. Oral contrast transits to the distal small bowel. No focal small bowel caliber transition. Normal appendix. Liquid stools and mild gaseous distention of the large bowel with no large bowel wall thickening, diverticulosis or significant pericolonic fat stranding. Vascular/Lymphatic: Atherosclerotic nonaneurysmal abdominal aorta. No pathologically enlarged lymph nodes in the abdomen or pelvis. Reproductive: Grossly normal uterus.  No adnexal mass. Other: Trace free fluid in the pelvis. No focal fluid collection. No pneumoperitoneum. Mild anasarca is new. Musculoskeletal: No aggressive appearing focal osseous lesions. Moderate thoracolumbar spondylosis. IMPRESSION: 1. Generalized mild dilatation of the small and large bowel without focal caliber transition, favoring mild adynamic ileus. 2. Small dependent bilateral pleural effusions, new.  Patchy consolidation and volume loss at the dependent lung bases bilaterally, most prominent in the left lower lobe, slightly worsened, favor a combination of pneumonia and atelectasis. 3. Trace free fluid in the pelvis.  New mild anasarca. 4. Punctate nonobstructing lower left renal stone. 5. Aortic Atherosclerosis (ICD10-I70.0). Electronically Signed   By: Ilona Sorrel [name]   On: 01/21/2020 08:28   DG Abd 1 View  Result Date: 01/21/2020 CLINICAL DATA:  Respiratory distress EXAM: ABDOMEN - 1 VIEW COMPARISON:  January 19, 2020 FINDINGS: Mildly dilated air-filled loops of bowel are seen within the mid abdomen measuring up to 3.6 cm. There is air seen within the colon to the level of the distal sigmoid colon. Surgical clips in the right upper quadrant. IMPRESSION: Increased mildly dilated air-filled loops of small bowel which could be due to ileus versus early partial small bowel obstruction. Electronically Signed   By: Prudencio Pair [name]   On: 01/21/2020 02:24   DG CHEST PORT 1 VIEW  Result Date: 01/21/2020 CLINICAL DATA:  Respiratory distress EXAM: PORTABLE CHEST 1 VIEW COMPARISON:  01/19/2020 FINDINGS: Unchanged cardiomegaly. Worsened interstitial opacities and left basilar opacity. No pneumothorax or sizable pleural effusion. Distended bowel visible in the upper abdomen. IMPRESSION: Mild interstitial pulmonary edema.  Left basilar atelectasis. Electronically Signed   By: Cletus Gash.D.  On: 01/21/2020 02:23        Scheduled Meds: . amiodarone  200 mg Oral BID  . apixaban  2.5 mg Oral BID  . atorvastatin  40 mg Oral Daily  . Chlorhexidine Gluconate Cloth  6 each Topical Daily  . collagenase   Topical Daily  . docusate sodium  100 mg Oral BID  . feeding supplement  1 Container Oral TID BM  . feeding supplement (ENSURE ENLIVE)  237 mL Oral BID BM  . insulin aspart  0-9 Units Subcutaneous Q4H  . iohexol      . mouth rinse  15 mL Mouth Rinse BID  . midodrine  10 mg Oral TID WC  .  multivitamin with minerals  1 tablet Oral Daily  . potassium chloride  40 mEq Oral Q4H  . sodium chloride flush  3 mL Intravenous Q12H  . tamsulosin  0.4 mg Oral Daily   Continuous Infusions: . cefTRIAXone (ROCEPHIN)  IV 2 g (01/21/20 1058)     LOS: 5 days    Time spent: 65min  Domenic Polite, MD [facility]  01/21/2020, 11:42 AM

## 2020-01-22 ENCOUNTER — Inpatient Hospital Stay (HOSPITAL_COMMUNITY): Payer: HMO

## 2020-01-22 LAB — COMPREHENSIVE METABOLIC PANEL
ALT: 38 U/L (ref 0–44)
AST: 63 U/L — ABNORMAL HIGH (ref 15–41)
Albumin: 1.1 g/dL — ABNORMAL LOW (ref 3.5–5.0)
Alkaline Phosphatase: 105 U/L (ref 38–126)
Anion gap: 9 (ref 5–15)
BUN: 12 mg/dL (ref 8–23)
CO2: 21 mmol/L — ABNORMAL LOW (ref 22–32)
Calcium: 7.6 mg/dL — ABNORMAL LOW (ref 8.9–10.3)
Chloride: 110 mmol/L (ref 98–111)
Creatinine, Ser: 1.07 mg/dL — ABNORMAL HIGH (ref 0.44–1.00)
GFR calc Af Amer: 56 mL/min — ABNORMAL LOW (ref >60)
GFR calc non Af Amer: 48 mL/min — ABNORMAL LOW (ref >60)
Glucose, Bld: 135 mg/dL — ABNORMAL HIGH (ref 70–99)
Potassium: 3.5 mmol/L (ref 3.5–5.1)
Sodium: 140 mmol/L (ref 135–145)
Total Bilirubin: 0.6 mg/dL (ref 0.3–1.2)
Total Protein: 4.6 g/dL — ABNORMAL LOW (ref 6.5–8.1)

## 2020-01-22 LAB — GLUCOSE, CAPILLARY
Glucose-Capillary: 122 mg/dL — ABNORMAL HIGH (ref 70–99)
Glucose-Capillary: 124 mg/dL — ABNORMAL HIGH (ref 70–99)
Glucose-Capillary: 73 mg/dL (ref 70–99)
Glucose-Capillary: 86 mg/dL (ref 70–99)
Glucose-Capillary: 86 mg/dL (ref 70–99)
Glucose-Capillary: 91 mg/dL (ref 70–99)

## 2020-01-22 LAB — CBC
HCT: 21.6 % — ABNORMAL LOW (ref 36.0–46.0)
Hemoglobin: 7 g/dL — ABNORMAL LOW (ref 12.0–15.0)
MCH: 26.1 pg (ref 26.0–34.0)
MCHC: 32.4 g/dL (ref 30.0–36.0)
MCV: 80.6 fL (ref 80.0–100.0)
Platelets: 280 10*3/uL (ref 150–400)
RBC: 2.68 MIL/uL — ABNORMAL LOW (ref 3.87–5.11)
RDW: 20.5 % — ABNORMAL HIGH (ref 11.5–15.5)
WBC: 18.5 10*3/uL — ABNORMAL HIGH (ref 4.0–10.5)
nRBC: 0.4 % — ABNORMAL HIGH (ref 0.0–0.2)

## 2020-01-22 LAB — AFP TUMOR MARKER: AFP, Serum, Tumor Marker: 5.7 ng/mL (ref 0.0–8.3)

## 2020-01-22 LAB — CEA: CEA: 14.2 ng/mL — ABNORMAL HIGH (ref 0.0–4.7)

## 2020-01-22 LAB — CANCER ANTIGEN 19-9: CA 19-9: 62 U/mL — ABNORMAL HIGH (ref 0–35)

## 2020-01-22 MED ORDER — SODIUM CHLORIDE 0.9 % IV SOLN
510.0000 mg | Freq: Once | INTRAVENOUS | Status: AC
  Administered 2020-01-22: 510 mg via INTRAVENOUS
  Filled 2020-01-22: qty 17

## 2020-01-22 MED ORDER — ENSURE ENLIVE PO LIQD
237.0000 mL | Freq: Three times a day (TID) | ORAL | Status: DC
  Administered 2020-01-22: 237 mL via ORAL

## 2020-01-22 MED ORDER — ENOXAPARIN SODIUM 30 MG/0.3ML ~~LOC~~ SOLN: 30.0000 mg | SUBCUTANEOUS | Status: DC

## 2020-01-22 NOTE — Progress Notes (Addendum)
On call provider paged about lab results, see results, new new orders at this time.

## 2020-01-22 NOTE — Progress Notes (Signed)
Nutrition Follow-up  DOCUMENTATION CODES:   Severe malnutrition in context of chronic illness  INTERVENTION:  Liberalize pt to regular diet  Ensure Enlive po TID, each supplement provides 350 kcal and 20 grams of protein  Continue MVI daily  Continue Magic cup TID with meals, each supplement provides 290 kcal and 9 grams of protein  D/c Boost Breeze  NUTRITION DIAGNOSIS:   Severe Malnutrition related to chronic illness as evidenced by severe muscle depletion, severe fat depletion, energy intake < or equal to 75% for > or equal to 1 month, percent weight loss.  ongoing  GOAL:   Patient will meet greater than or equal to 90% of their needs  Progressing  MONITOR:   Supplement acceptance, PO intake, Labs, I & O's, Weight trends, Skin  REASON FOR ASSESSMENT:   Consult Malnutrition Eval  ASSESSMENT:   Pt admitted with sepsis 2/2 multifocal pneumonia and elevated LFTs. PMH significant for CKD stage III, Afib, CHF, HTN, HLD, T2DM.  8/2 CT abd/pelvis revealed lower lobe consolidation and change in caliber of colon at splenic flexure with possible focal lesion concerning for colon cancer 8/7 s/p colonoscopy   Pt sleeping at time of RD visit. Per MD, pt's colonoscopy was normal and pt may need EGD.   Discussed pt with RN. Pt has had very minimal PO intake since last RD assessment and has been noted to be refusing multiple meals. RN unable to speak towards pt's consumption of oral nutrition supplements. If pt's po intake does not improve and if aligned with GOC, recommend initiation of nutrition support.   Labs: CBGs [id number] Medications: Colace, Boost Breeze po TID, Ensure Enlive po BID, Novolog, MVI   Diet Order:   Diet Order            Diet regular Room service appropriate? Yes; Fluid consistency: Thin  Diet effective now                 EDUCATION NEEDS:   Not appropriate for education at this time  Skin:  Skin Assessment: Skin Integrity Issues: Skin  Integrity Issues:: Unstageable Unstageable: sacrum  Last BM:  8/9  Height:   Ht Readings from Last 1 Encounters:  01/16/20 5\' 1"  (1.549 m)    Weight:   Wt Readings from Last 1 Encounters:  01/22/20 52 kg    BMI:  Body mass index is 21.66 kg/m.  Estimated Nutritional Needs:   Kcal:  1300-1500  Protein:  65-75 grams  Fluid:  >/=1.3L/d    Larkin Ina, MS, RD, LDN RD pager number and weekend/on-call pager number located in [place].

## 2020-01-22 NOTE — Progress Notes (Addendum)
   01/21/20 2021  Assess: MEWS Score  Temp (!) 100.7 F (38.2 C)  BP (!) 101/56  Pulse Rate 98  Resp 16  SpO2 92 %  O2 Device Room Air  Assess: MEWS Score  MEWS Temp 1  MEWS Systolic 0  MEWS Pulse 0  MEWS RR 0  MEWS LOC 1  MEWS Score 2  MEWS Score Color Yellow  Assess: if the MEWS score is Yellow or Red  Were vital signs taken at a resting state? Yes  Focused Assessment No change from prior assessment  Early Detection of Sepsis Score *See Row Information* High  MEWS guidelines implemented *See Row Information* Yes  Treat  MEWS Interventions Administered prn meds/treatments  Pain Scale 0-10  Pain Score 0  Take Vital Signs  Increase Vital Sign Frequency  Yellow: Q 2hr X 2 then Q 4hr X 2, if remains yellow, continue Q 4hrs  Escalate  MEWS: Escalate Yellow: discuss with charge nurse/RN and consider discussing with provider and RRT  Notify: Charge Nurse/RN  Name of Charge Nurse/RN Notified Rodena Piety RN  Date Charge Nurse/RN Notified 01/21/20  Time Charge Nurse/RN Notified 2022  Document  Progress note created (see row info) Yes

## 2020-01-22 NOTE — Progress Notes (Signed)
Patient ID: Kerry Barnes, female   DOB: 1933/11/09, 86 y.o.   MRN: [id number] New mepilex just applied so did not remove. Will examine tomorrow for dressing change.

## 2020-01-22 NOTE — Progress Notes (Signed)
PROGRESS NOTE    Kerry Barnes  MIW:[id number] DOB: 1934-01-07 DOA: 01/15/2020 PCP: Aletha Halim., PA-C  Brief Narrative: 86 year old female with PMHx of CKD III, atrial fibrillation on Eliquis, chronic diastolic heart failure, hypertension, hyperlipidemia, and diabetes mellitus presenting for worsening weakness, FTT noted to be hypotensive in the ED, admitted to ICU treated with IV fluids, Abx, blood pressure stabilized, CT abdomen pelvis noted lower lobe consolidation and changing caliber of the colon suggesting possible focal lesion -Transferred to [facility] service 8/4 -noted to have severe hypoalbuminemia, unstagable sacral wound, UTI, Gi consulted, colonoscopy planned, continues to have failure to thrive    Assessment & Plan:   Severe failure to thrive, weight loss, debility, severe hypoalbuminemia, anemia Severe malnutrition -Spouse reports decline for atleast 6-12 months, malignancy, severe malnutrition, liver disease are considerations -GI following, colonoscopy 8/7 was normal, Gi signed off -CT chest with contrast unremarkable, will order Esophagram, CT abd without obvious malignancy -Right upper quadrant ultrasound noted abnormal liver echotexture?  Fatty liver, cannot r/o cirhosis, elastography not done on inpatient ultrasounds, HCV negative, Ferritin >2000, suspect this is acute phase reactant, unlikely to develop hemochromatosis at this age -TSH is unremarkable, Random cortisol is high ruling out AI -Prognosis is poor in the setting of ongoing debility, bedbound state, severe hypoalbuminemia, sacral decubitus ulcers etc., discussed poor prognosis with patient's spouse and patient, recommended DNR to pt, she wanted to stay full code, will request Palliative consult  Multifocal pneumonia/bilateral pleural effusions -CT on admission noted bilateral lower lobe consolidation -Treated with IV Levaquin in the ICU, change to ceftriaxone for better coverage for UTI/wound -Blood  cultures negative, urine Legionella and pneumococcal antigen are negative -SLP eval completed, no overt aspiration -Wean O2  Klebsiella UTI Recurrent urinary retention -Continue ceftriaxone for at least 2 more days, continues to have profound leukocytosis -Foley catheter placed 8/7 due to recurrent urinary retention, also has a necrotic sacral decubitus wound  Unstageable sacral decubitus wound -With black eschar , no abscess on CT noted -Wound nurse following -Seen by general surgery,, do not expect this to heal unfortunately in the setting of severe hypoalbuminemia and bedbound state, discussed this with spouse again today -General surgery recommended local wound care, given concern that debridement would probably lead to nonhealing chronic wound  Metabolic encephalopathy -In the setting of infection, gabapentin -Mental status with slow improvement, gabapentin discontinued   Iron deficiency anemia -Anemia panel with iron deficiency and significantly elevated ferritin -GI consulted, given IV iron x1 8/5 -Colonoscopy completed 8/7 was unremarkable, GI signed off -Will order barium esophagram -Peripheral smear noted microcytic anemia, hemoglobin electrophoresis is unremarkable -LDH is normal  Hypotension -Mild, may be chronic, maps greater than 60 -Clinically do not suspect sepsis any longer -Blood cultures are negative -Started on midodrine, increased dose  Paroxysmal atrial fibrillation -In sinus rhythm at this time, Eliquis resumed after colonoscopy -Continue amiodarone   Acute on chronic diastolic CHF -Echo notes diastolic dysfunction, preserved EF and moderate mitral regurgitation -Appears slightly fluid overloaded -Fluid overload is primarily third spacing from severe hypoalbuminemia -Dosed with prn Lasix as tolerated by blood pressure  Hypokalemia -Due to GI losses from prep and Lasix -Replaced  Type 2 diabetes mellitus -Januvia on hold, hba1c is 5.1 -Sliding  scale insulin for now  Goal of care discussion -Poor prognosis discussed with patient and spouse 8/6 and 8/7, recommended consideration of DNR, patient would like to be a full code with full scope of treatment -Complete above work-up, poor prognosis overall,  palliative consult requested  DVT prophylaxis: Eliquis resumed Code Status: Full code Family Communication: Spouse at bedside 8/6, 8/7, 8/9, also called and updated daughter Xzavian Semmel Art 8/8 Disposition Plan:  Status is: Inpatient  Remains inpatient appropriate because:Inpatient level of care appropriate due to severity of illness   Dispo: The patient is from: Home              Anticipated d/c is to: SNF              Anticipated d/c date is: Pending above work-up              Patient currently is not medically stable to d/c.  Consultants:   PCCM transfer  Gastroenterology   Procedures:   Antimicrobials:  Levaquin  Subjective: -No events overnight, oral intake remains poor, blood pressure is soft but asymptomatic  Objective: Vitals:   01/22/20 0002 01/22/20 0408 01/22/20 0430 01/22/20 0856  BP: (!) 96/49 (!) 99/55  (!) 92/49  Pulse: 92 76  96  Resp: 15 15  18   Temp: 99.3 F (37.4 C) 99 F (37.2 C)  98 F (36.7 C)  TempSrc: Oral Axillary  Oral  SpO2: 92% 91%  94%  Weight:   52 kg   Height:        Intake/Output Summary (Last 24 hours) at 01/22/2020 1310 Last data filed at 01/22/2020 1151 Gross per 24 hour  Intake 681.19 ml  Output 825 ml  Net -143.81 ml   Filed Weights   01/20/20 2118 01/21/20 2021 01/22/20 0430  Weight: 51.2 kg 52 kg 52 kg    Examination:  General exam: Chronically ill elderly extremely cachectic female sitting up in bed, awake alert oriented to self and place, flat affect HEENT: Positive JVD CVS: S1-S2, regular rate rhythm Lungs: Decreased breath sounds the bases, otherwise clear Abdomen: Soft, nontender, bowel sounds present Extremities: Trace edema  Skin:5x 6 centimeter sacral wound  with yellow slough and gray/black eschar Psychiatry: Flat affect    Data Reviewed:   CBC: Recent Labs  Lab 01/15/20 1901 01/15/20 1901 01/16/20 0517 01/17/20 0456 01/18/20 0325 01/18/20 0325 01/19/20 0753 01/20/20 0220 01/20/20 1136 01/21/20 0300 01/22/20 0250  WBC 20.3*   < > 17.3*   < > 16.9*  --  21.8* 21.2*  --  22.5* 18.5*  NEUTROABS 17.6*  --  15.4*  --   --   --   --   --   --   --   --   HGB 10.5*   < > 7.7*   < > 8.1*   < > 8.4* 8.0* 10.9* 7.7* 7.0*  HCT 32.2*   < > 24.1*   < > 24.5*   < > 25.0* 24.4* 32.0* 23.8* 21.6*  MCV 80.7   < > 80.3   < > 79.5*  --  79.6* 78.5*  --  79.9* 80.6  PLT 334   < > 268   < > 313  --  285 300  --  280 280   < > = values in this interval not displayed.   Basic Metabolic Panel: Recent Labs  Lab 01/15/20 1901 01/16/20 0946 01/18/20 0325 01/18/20 0325 01/19/20 0753 01/20/20 0220 01/20/20 1136 01/21/20 0300 01/22/20 0250  NA 133*   < > 134*   < > 138 141 144 144 140  K 4.2   < > 3.4*   < > 3.0* 2.7* 4.0 2.7* 3.5  CL 101   < > 106   < >  109 109 115* 111 110  CO2 23   < > 21*  --  19* 19*  --  20* 21*  GLUCOSE 101*   < > 106*   < > 93 146* 99 105* 135*  BUN 22   < > 13   < > 17 17 21 14 12   CREATININE 1.18*   < > 0.91   < > 1.12* 1.15* 0.90 1.03* 1.07*  CALCIUM 8.3*   < > 7.1*  --  7.7* 7.8*  --  7.6* 7.6*  MG 1.9  --   --   --   --  1.6*  --  1.6*  --   PHOS 2.7  --   --   --   --   --   --   --   --    < > = values in this interval not displayed.   GFR: Estimated Creatinine Clearance: 30.1 mL/min (A) (by C-G formula based on SCr of 1.07 mg/dL (H)). Liver Function Tests: Recent Labs  Lab 01/17/20 0456 01/18/20 0325 01/19/20 0753 01/21/20 0300 01/22/20 0250  AST 73* 69* 64* 68* 63*  ALT 55* 51* 48* 43 38  ALKPHOS 73 88 123 114 105  BILITOT 1.8* 1.4* 1.0 0.8 0.6  PROT 4.7* 5.1* 5.4* 4.9* 4.6*  ALBUMIN 1.4* 1.4* 1.5* 1.4* 1.1*   No results for input(s): LIPASE, AMYLASE in the last 168 hours. Recent Labs  Lab  01/15/20 2237  AMMONIA 21   Coagulation Profile: Recent Labs  Lab 01/16/20 0946  INR 1.5*   Cardiac Enzymes: Recent Labs  Lab 01/15/20 2337  CKTOTAL 534*   BNP (last 3 results) No results for input(s): PROBNP in the last 8760 hours. HbA1C: No results for input(s): HGBA1C in the last 72 hours. CBG: Recent Labs  Lab 01/21/20 2020 01/22/20 0000 01/22/20 0403 01/22/20 0730 01/22/20 1128  GLUCAP 160* 124* 122* 86 86   Lipid Profile: No results for input(s): CHOL, HDL, LDLCALC, TRIG, CHOLHDL, LDLDIRECT in the last 72 hours. Thyroid Function Tests: No results for input(s): TSH, T4TOTAL, FREET4, T3FREE, THYROIDAB in the last 72 hours. Anemia Panel: No results for input(s): VITAMINB12, FOLATE, FERRITIN, TIBC, IRON, RETICCTPCT in the last 72 hours. Urine analysis:    Component Value Date/Time   COLORURINE AMBER (A) 01/19/2020 0912   APPEARANCEUR CLEAR 01/19/2020 0912   LABSPEC 1.025 01/19/2020 0912   PHURINE 5.0 01/19/2020 0912   GLUCOSEU NEGATIVE 01/19/2020 0912   HGBUR MODERATE (A) 01/19/2020 0912   BILIRUBINUR NEGATIVE 01/19/2020 0912   KETONESUR NEGATIVE 01/19/2020 0912   PROTEINUR 30 (A) 01/19/2020 0912   UROBILINOGEN 0.2 09/08/2010 1330   NITRITE NEGATIVE 01/19/2020 0912   LEUKOCYTESUR NEGATIVE 01/19/2020 0912   Sepsis Labs: @LABRCNTIP (procalcitonin:4,lacticidven:4)  ) Recent Results (from the past 240 hour(s))  SARS Coronavirus 2 by RT PCR (hospital order, performed in [facility] hospital lab) Nasopharyngeal Nasopharyngeal Swab     Status: None   Collection Time: 01/15/20  7:00 PM   Specimen: Nasopharyngeal Swab  Result Value Ref Range Status   SARS Coronavirus 2 NEGATIVE NEGATIVE Final    Comment: (NOTE) SARS-CoV-2 target nucleic acids are NOT DETECTED.  The SARS-CoV-2 RNA is generally detectable in upper and lower respiratory specimens during the acute phase of infection. The lowest concentration of SARS-CoV-2 viral copies this assay can detect is  250 copies / mL. A negative result does not preclude SARS-CoV-2 infection and should not be used as the sole basis for treatment or other patient  management decisions.  A negative result may occur with improper specimen collection / handling, submission of specimen other than nasopharyngeal swab, presence of viral mutation(s) within the areas targeted by this assay, and inadequate number of viral copies (<250 copies / mL). A negative result must be combined with clinical observations, patient history, and epidemiological information.  Fact Sheet for Patients:   StrictlyIdeas.no  Fact Sheet for Healthcare Providers: BankingDealers.co.za  This test is not yet approved or  cleared by the [place] FDA and has been authorized for detection and/or diagnosis of SARS-CoV-2 by FDA under an Emergency Use Authorization (EUA).  This EUA will remain in effect (meaning this test can be used) for the duration of the COVID-19 declaration under Section 564(b)(1) of the Act, 21 U.S.C. section 360bbb-3(b)(1), unless the authorization is terminated or revoked sooner.  Performed at [facility], [place] [address], [place], [place] [address]   Culture, blood (Routine x 2)     Status: None   Collection Time: 01/15/20  7:01 PM   Specimen: BLOOD  Result Value Ref Range Status   Specimen Description BLOOD SITE NOT SPECIFIED  Final   Special Requests   Final    BOTTLES DRAWN AEROBIC AND ANAEROBIC Blood Culture adequate volume   Culture   Final    NO GROWTH 5 DAYS Performed at [facility], [place] [address], [place], [place] [address]    Report Status 01/20/2020 FINAL  Final  Culture, blood (Routine x 2)     Status: None   Collection Time: 01/15/20  7:48 PM   Specimen: BLOOD RIGHT FOREARM  Result Value Ref Range Status   Specimen Description BLOOD RIGHT FOREARM  Final   Special Requests   Final    BOTTLES DRAWN AEROBIC ONLY Blood Culture  results may not be optimal due to an inadequate volume of blood received in culture bottles   Culture   Final    NO GROWTH 5 DAYS Performed at [facility], [place] [address] [place], [place] [address]    Report Status 01/20/2020 FINAL  Final  Urine culture     Status: Abnormal   Collection Time: 01/15/20  7:55 PM   Specimen: Urine, Random  Result Value Ref Range Status   Specimen Description URINE, RANDOM  Final   Special Requests   Final    NONE Performed at [facility], [place] [address], [place], [place] [address]    Culture >=100,000 COLONIES/mL KLEBSIELLA PNEUMONIAE (A)  Final   Report Status 01/18/2020 FINAL  Final   Organism ID, Bacteria KLEBSIELLA PNEUMONIAE (A)  Final      Susceptibility   Klebsiella pneumoniae - MIC*    AMPICILLIN RESISTANT Resistant     CEFAZOLIN <=4 SENSITIVE Sensitive     CEFTRIAXONE <=0.25 SENSITIVE Sensitive     CIPROFLOXACIN <=0.25 SENSITIVE Sensitive     GENTAMICIN <=1 SENSITIVE Sensitive     IMIPENEM <=0.25 SENSITIVE Sensitive     NITROFURANTOIN 32 SENSITIVE Sensitive     TRIMETH/SULFA <=20 SENSITIVE Sensitive     AMPICILLIN/SULBACTAM <=2 SENSITIVE Sensitive     PIP/TAZO <=4 SENSITIVE Sensitive     * >=100,000 COLONIES/mL KLEBSIELLA PNEUMONIAE  MRSA PCR Screening     Status: None   Collection Time: 01/16/20  5:41 AM   Specimen: Nasal Mucosa; Nasopharyngeal  Result Value Ref Range Status   MRSA by PCR NEGATIVE NEGATIVE Final    Comment:        The GeneXpert MRSA  Assay (FDA approved for NASAL specimens only), is one component of a comprehensive MRSA colonization surveillance program. It is not intended to diagnose MRSA infection nor to guide or monitor treatment for MRSA infections. Performed at [facility], [place] [address], [place], [place] [address]   Culture, Urine     Status: None   Collection Time: 01/19/20  9:12 AM   Specimen: Urine, Random  Result Value Ref Range Status   Specimen Description URINE, RANDOM   Final   Special Requests NONE  Final   Culture   Final    NO GROWTH Performed at [facility], [place] [address] [place], [place] [address]    Report Status 01/20/2020 FINAL  Final  Culture, blood (routine x 2)     Status: None (Preliminary result)   Collection Time: 01/21/20  9:52 AM   Specimen: BLOOD  Result Value Ref Range Status   Specimen Description BLOOD RIGHT ANTECUBITAL  Final   Special Requests   Final    BOTTLES DRAWN AEROBIC AND ANAEROBIC Blood Culture adequate volume   Culture   Final    NO GROWTH < 24 HOURS Performed at [facility], [place] [address], [place], [place] [address]    Report Status PENDING  Incomplete  Culture, blood (routine x 2)     Status: None (Preliminary result)   Collection Time: 01/21/20 10:02 AM   Specimen: BLOOD RIGHT HAND  Result Value Ref Range Status   Specimen Description BLOOD RIGHT HAND  Final   Special Requests   Final    BOTTLES DRAWN AEROBIC ONLY Blood Culture results may not be optimal due to an inadequate volume of blood received in culture bottles   Culture   Final    NO GROWTH < 24 HOURS Performed at [facility], [place] [address] [place], [place] [address]    Report Status PENDING  Incomplete         Radiology Studies: CT ABDOMEN PELVIS WO CONTRAST  Result Date: 01/21/2020 CLINICAL DATA:  Diffuse abdominal pain and distention. Bowel obstruction suspected. Inpatient. EXAM: CT ABDOMEN AND PELVIS WITHOUT CONTRAST TECHNIQUE: Multidetector CT imaging of the abdomen and pelvis was performed following the standard protocol without IV contrast. COMPARISON:  01/15/2020 CT abdomen/pelvis. Abdominal radiograph from earlier today. FINDINGS: Lower chest: Small dependent bilateral pleural effusions, new. Patchy consolidation and volume loss at the dependent lung bases bilaterally, most prominent in the left lower lobe, slightly worsened. Low cardiac blood pool density indicates anemia. Hepatobiliary: Normal liver size. No liver  mass. Cholecystectomy. Bile ducts are stable and within normal post cholecystectomy limits. CBD diameter 6 mm. Pancreas: Normal, with no mass or duct dilation. Spleen: Normal size. No mass. Adrenals/Urinary Tract: Normal adrenals. No hydronephrosis. Punctate nonobstructing lower left renal stone. No right renal stones. No contour deforming renal masses. Bladder decompressed by indwelling Foley catheter. Stomach/Bowel: Normal non-distended stomach. Borderline mild diffuse dilatation of the small bowel up to 3.0 cm diameter. No small bowel wall thickening. Oral contrast transits to the distal small bowel. No focal small bowel caliber transition. Normal appendix. Liquid stools and mild gaseous distention of the large bowel with no large bowel wall thickening, diverticulosis or significant pericolonic fat stranding. Vascular/Lymphatic: Atherosclerotic nonaneurysmal abdominal aorta. No pathologically enlarged lymph nodes in the abdomen or pelvis. Reproductive: Grossly normal uterus.  No adnexal mass. Other: Trace free fluid in the pelvis. No focal fluid collection. No pneumoperitoneum. Mild anasarca is new. Musculoskeletal: No aggressive appearing focal osseous  lesions. Moderate thoracolumbar spondylosis. IMPRESSION: 1. Generalized mild dilatation of the small and large bowel without focal caliber transition, favoring mild adynamic ileus. 2. Small dependent bilateral pleural effusions, new. Patchy consolidation and volume loss at the dependent lung bases bilaterally, most prominent in the left lower lobe, slightly worsened, favor a combination of pneumonia and atelectasis. 3. Trace free fluid in the pelvis.  New mild anasarca. 4. Punctate nonobstructing lower left renal stone. 5. Aortic Atherosclerosis (ICD10-I70.0). Electronically Signed   By: Ilona Sorrel [name]   On: 01/21/2020 08:28   DG Abd 1 View  Result Date: 01/21/2020 CLINICAL DATA:  Respiratory distress EXAM: ABDOMEN - 1 VIEW COMPARISON:  January 19, 2020  FINDINGS: Mildly dilated air-filled loops of bowel are seen within the mid abdomen measuring up to 3.6 cm. There is air seen within the colon to the level of the distal sigmoid colon. Surgical clips in the right upper quadrant. IMPRESSION: Increased mildly dilated air-filled loops of small bowel which could be due to ileus versus early partial small bowel obstruction. Electronically Signed   By: Prudencio Pair [name]   On: 01/21/2020 02:24   CT CHEST W CONTRAST  Result Date: 01/21/2020 CLINICAL DATA:  Cough and persistent weight loss. EXAM: CT CHEST WITH CONTRAST TECHNIQUE: Multidetector CT imaging of the chest was performed during intravenous contrast administration. CONTRAST:  93mL OMNIPAQUE IOHEXOL 300 MG/ML  SOLN COMPARISON:  April 23, 2018 FINDINGS: Cardiovascular: There is mild calcification of the aortic arch. Normal heart size. No pericardial effusion. Mediastinum/Nodes: No enlarged mediastinal, hilar, or axillary lymph nodes. A 1.4 cm x 1.2 cm focus of parenchymal low attenuation is seen within the left lobe of the thyroid gland. The trachea and esophagus demonstrate no significant findings. Lungs/Pleura: The 4 mm noncalcified lung nodule is seen within the posterolateral aspect of the left upper lobe (axial CT image 51, CT series number 4). Mild posterior bilateral upper lobe atelectasis is present. Moderate to marked severity bibasilar atelectasis and/or infiltrate is seen. There are small bilateral pleural effusions. No pneumothorax is identified. Upper Abdomen: There is a mild amount of perihepatic and perisplenic fluid. Musculoskeletal: Moderate to marked severity endplate sclerosis is seen at the level of T1-T2. IMPRESSION: 1. Moderate to marked severity bibasilar atelectasis and/or infiltrate. 2. Small bilateral pleural effusions. 3. 4 mm noncalcified lung nodule within the left upper lobe. Correlation with 12 month follow-up chest CT is recommended to determine stability. 4. Moderate to marked  severity endplate sclerosis at the level of T1-T2. This may represent sequelae associated with degenerative disc disease. Correlation with MRI is recommended. 5. A 1.4 cm x 1.2 cm focus of parenchymal low attenuation is seen within the left lobe of the thyroid gland. Recommend further evaluation with thyroid ultrasound. 6. Mild amount of perihepatic and perisplenic fluid. 7. Aortic atherosclerosis. Aortic Atherosclerosis (ICD10-I70.0). Electronically Signed   By: Virgina Norfolk [name]   On: 01/21/2020 19:43   DG CHEST PORT 1 VIEW  Result Date: 01/21/2020 CLINICAL DATA:  Respiratory distress EXAM: PORTABLE CHEST 1 VIEW COMPARISON:  01/19/2020 FINDINGS: Unchanged cardiomegaly. Worsened interstitial opacities and left basilar opacity. No pneumothorax or sizable pleural effusion. Distended bowel visible in the upper abdomen. IMPRESSION: Mild interstitial pulmonary edema.  Left basilar atelectasis. Electronically Signed   By: Ulyses Jarred [name]   On: 01/21/2020 02:23        Scheduled Meds: . amiodarone  200 mg Oral BID  . apixaban  2.5 mg Oral BID  . atorvastatin  40 mg Oral  Daily  . Chlorhexidine Gluconate Cloth  6 each Topical Daily  . collagenase   Topical Daily  . docusate sodium  100 mg Oral BID  . feeding supplement (ENSURE ENLIVE)  237 mL Oral TID BM  . insulin aspart  0-9 Units Subcutaneous Q4H  . mouth rinse  15 mL Mouth Rinse BID  . midodrine  10 mg Oral TID WC  . multivitamin with minerals  1 tablet Oral Daily  . sodium chloride flush  3 mL Intravenous Q12H  . tamsulosin  0.4 mg Oral Daily   Continuous Infusions:    LOS: 6 days    Time spent: 59min  Domenic Polite, MD [facility]  01/22/2020, 1:10 PM

## 2020-01-22 NOTE — Progress Notes (Signed)
OT Cancellation Note  Patient Details Name: Kerry Barnes MRN: [id number] DOB: 13-Apr-1934   Cancelled Treatment:    Reason Eval/Treat Not Completed: Patient at procedure or test/ unavailable. Pt off of the unit for swallow study. Will follow-up as time allows.   Layla Maw, OTR/L 01/22/2020, 1:43 PM

## 2020-01-23 ENCOUNTER — Encounter (HOSPITAL_COMMUNITY): Payer: Self-pay | Admitting: Gastroenterology

## 2020-01-23 ENCOUNTER — Other Ambulatory Visit: Payer: Self-pay

## 2020-01-23 DIAGNOSIS — L89150 Pressure ulcer of sacral region, unstageable: Secondary | ICD-10-CM

## 2020-01-23 DIAGNOSIS — Z66 Do not resuscitate: Secondary | ICD-10-CM

## 2020-01-23 DIAGNOSIS — Z515 Encounter for palliative care: Secondary | ICD-10-CM

## 2020-01-23 DIAGNOSIS — R627 Adult failure to thrive: Secondary | ICD-10-CM

## 2020-01-23 LAB — COMPREHENSIVE METABOLIC PANEL
ALT: 38 U/L (ref 0–44)
AST: 58 U/L — ABNORMAL HIGH (ref 15–41)
Albumin: 1.2 g/dL — ABNORMAL LOW (ref 3.5–5.0)
Alkaline Phosphatase: 136 U/L — ABNORMAL HIGH (ref 38–126)
Anion gap: 9 (ref 5–15)
BUN: 15 mg/dL (ref 8–23)
CO2: 22 mmol/L (ref 22–32)
Calcium: 8 mg/dL — ABNORMAL LOW (ref 8.9–10.3)
Chloride: 107 mmol/L (ref 98–111)
Creatinine, Ser: 0.96 mg/dL (ref 0.44–1.00)
GFR calc Af Amer: >60 mL/min (ref >60)
GFR calc non Af Amer: 55 mL/min — ABNORMAL LOW (ref >60)
Glucose, Bld: 106 mg/dL — ABNORMAL HIGH (ref 70–99)
Potassium: 4.6 mmol/L (ref 3.5–5.1)
Sodium: 138 mmol/L (ref 135–145)
Total Bilirubin: 0.9 mg/dL (ref 0.3–1.2)
Total Protein: 5.2 g/dL — ABNORMAL LOW (ref 6.5–8.1)

## 2020-01-23 LAB — CBC
HCT: 22.4 % — ABNORMAL LOW (ref 36.0–46.0)
Hemoglobin: 7.5 g/dL — ABNORMAL LOW (ref 12.0–15.0)
MCH: 26.7 pg (ref 26.0–34.0)
MCHC: 33.5 g/dL (ref 30.0–36.0)
MCV: 79.7 fL — ABNORMAL LOW (ref 80.0–100.0)
Platelets: 289 10*3/uL (ref 150–400)
RBC: 2.81 MIL/uL — ABNORMAL LOW (ref 3.87–5.11)
RDW: 20.9 % — ABNORMAL HIGH (ref 11.5–15.5)
WBC: 17.4 10*3/uL — ABNORMAL HIGH (ref 4.0–10.5)
nRBC: 0.6 % — ABNORMAL HIGH (ref 0.0–0.2)

## 2020-01-23 LAB — GLUCOSE, CAPILLARY
Glucose-Capillary: 102 mg/dL — ABNORMAL HIGH (ref 70–99)
Glucose-Capillary: 103 mg/dL — ABNORMAL HIGH (ref 70–99)
Glucose-Capillary: 90 mg/dL (ref 70–99)

## 2020-01-23 MED ORDER — MORPHINE SULFATE (CONCENTRATE) 10 MG/0.5ML PO SOLN
5.0000 mg | ORAL | Status: DC | PRN
  Administered 2020-01-23 – 2020-01-24 (×2): 5 mg via ORAL
  Filled 2020-01-23 (×2): qty 0.5

## 2020-01-23 NOTE — Progress Notes (Signed)
PROGRESS NOTE    Kerry Barnes  TKZ:[id number] DOB: 21-Sep-1933 DOA: 01/15/2020 PCP: Aletha Halim., PA-C  Brief Narrative: 86 year old female with PMHx of CKD III, atrial fibrillation on Eliquis, chronic diastolic heart failure, hypertension, hyperlipidemia, and diabetes mellitus presenting for worsening weakness, FTT noted to be hypotensive in the ED, admitted to ICU treated with IV fluids, Abx, blood pressure stabilized, CT abdomen pelvis noted lower lobe consolidation and changing caliber of the colon suggesting possible focal lesion -Transferred to [facility] service 8/4 -noted to have severe hypoalbuminemia, unstagable sacral wound, UTI, Gi consulted, colonoscopy planned, continued to have failure to thrive    Assessment & Plan:   Severe failure to thrive, weight loss, debility, severe hypoalbuminemia, anemia Severe malnutrition -Spouse reports decline for atleast 6-12 months -GI following, colonoscopy 8/7 was normal, Gi signed off -CT chest/abdomen was and barium esophagram were negative for obvious malignancy -Right upper quadrant ultrasound noted abnormal liver echotexture?  Fatty liver, cannot r/o cirhosis, elastography not done on inpatient ultrasounds, HCV negative, Ferritin >2000, suspect this is acute phase reactant, unlikely to develop hemochromatosis at this age -TSH is unremarkable, Random cortisol is high ruling out AI -Prognosis is poor in the setting of ongoing debility, bedbound state, severe hypoalbuminemia, sacral decubitus ulcers etc., discussed poor prognosis with patient's spouse and patient, recommended DNR -Palliative medicine consulted, transitioning to comfort care today  Multifocal pneumonia/bilateral pleural effusions -CT on admission noted bilateral lower lobe consolidation -Treated with IV Levaquin in the ICU, changed to ceftriaxone for better coverage for UTI/wound -Blood cultures negative, urine Legionella and pneumococcal antigen are negative -SLP eval  completed, no overt aspiration -Wean O2 -Completed 7 days of antibiotics, now comfort care  Klebsiella UTI Recurrent urinary retention -Treated with Levaquin followed by ceftriaxone x7 days -Foley catheter placed 8/7 due to recurrent urinary retention, also has a necrotic sacral decubitus wound  Unstageable sacral decubitus wound -With black eschar , no abscess on CT noted -Wound nurse following -Seen by general surgery,, do not expect this to heal unfortunately in the setting of severe hypoalbuminemia and bedbound state, discussed this with spouse and stepdaughter again -General surgery recommended local wound care, given concern that debridement would probably lead to nonhealing chronic wound  Metabolic encephalopathy -In the setting of infection, gabapentin -Mental status with slow improvement, gabapentin discontinued   Iron deficiency anemia -Anemia panel with iron deficiency and significantly elevated ferritin -GI consulted, given IV iron x1 8/5 -Colonoscopy completed 8/7 was unremarkable, GI signed off -CT abdomen pelvis and barium esophagram negative for obvious malignancy -Now comfort care  Hypotension -Mild, may be chronic, maps greater than 60 -Clinically do not suspect sepsis any longer -Blood cultures are negative -Was started on midodrine this admission, now comfort care  Paroxysmal atrial fibrillation -In sinus rhythm at this time, Eliquis resumed after colonoscopy -Continue amiodarone   Acute on chronic diastolic CHF -Echo notes diastolic dysfunction, preserved EF and moderate mitral regurgitation -Appears slightly fluid overloaded -Fluid overload is primarily third spacing from severe hypoalbuminemia -Dosed with prn Lasix as tolerated by blood pressure  Hypokalemia -Due to GI losses from prep and Lasix -Replaced  Type 2 diabetes mellitus -Januvia on hold, hba1c is 5.1   Goal of care discussion -Elderly frail extremely malnourished bedbound patient  with large unstageable sacral decubitus ulcer -Discussed poor prognosis with patient spouse on multiple occasion and updated daughter -Status post palliative care meeting, now comfort care  DVT prophylaxis: SCDs Code Status: DNR Family Communication: Spouse and stepdaughter at  bedside Disposition Plan:  Status is: Inpatient  Remains inpatient appropriate because:Inpatient level of care appropriate due to severity of illness Dispo: The patient is from: Home              Anticipated d/c is to: to be determined, may need residential hospice              Anticipated d/c date is: Pending above work-up              Patient currently is not medically stable to d/c.  Consultants:   PCCM transfer  Gastroenterology   Procedures:   Antimicrobials:  Levaquin  Subjective: -No events overnight, oral intake remains poor, blood pressure stays soft  Objective: Vitals:   01/22/20 1657 01/22/20 2035 01/23/20 0455 01/23/20 0919  BP: (!) 91/59 (!) 86/58 (!) 98/58 (!) 97/54  Pulse: 97 100 100 100  Resp: 16 18 18 16   Temp: 98.2 F (36.8 C) 100.3 F (37.9 C) 98.9 F (37.2 C) 98.1 F (36.7 C)  TempSrc:  Oral    SpO2: 94% 95% 98% 98%  Weight:      Height:        Intake/Output Summary (Last 24 hours) at 01/23/2020 1348 Last data filed at 01/23/2020 1237 Gross per 24 hour  Intake 490 ml  Output 550 ml  Net -60 ml   Filed Weights   01/20/20 2118 01/21/20 2021 01/22/20 0430  Weight: 51.2 kg 52 kg 52 kg    Examination:  General exam: Chronically ill elderly, extremely cachectic female laying in bed, awakens to voice, alert oriented to self and place, flat affect HEENT: Positive JVD CVS: S1-S2, regular rate rhythm Lungs: Decreased breath sounds the bases, otherwise clear Abdomen: Soft, nontender, bowel sounds present Extremities: Trace edema  Skin:5x 6 centimeter sacral wound with yellow slough and gray/black eschar Psychiatry: Flat affect    Data Reviewed:   CBC: Recent  Labs  Lab 01/19/20 0753 01/19/20 0753 01/20/20 0220 01/20/20 1136 01/21/20 0300 01/22/20 0250 01/23/20 0604  WBC 21.8*  --  21.2*  --  22.5* 18.5* 17.4*  HGB 8.4*   < > 8.0* 10.9* 7.7* 7.0* 7.5*  HCT 25.0*   < > 24.4* 32.0* 23.8* 21.6* 22.4*  MCV 79.6*  --  78.5*  --  79.9* 80.6 79.7*  PLT 285  --  300  --  280 280 289   < > = values in this interval not displayed.   Basic Metabolic Panel: Recent Labs  Lab 01/19/20 0753 01/19/20 0753 01/20/20 0220 01/20/20 1136 01/21/20 0300 01/22/20 0250 01/23/20 0604  NA 138   < > 141 144 144 140 138  K 3.0*   < > 2.7* 4.0 2.7* 3.5 4.6  CL 109   < > 109 115* 111 110 107  CO2 19*  --  19*  --  20* 21* 22  GLUCOSE 93   < > 146* 99 105* 135* 106*  BUN 17   < > 17 21 14 12 15   CREATININE 1.12*   < > 1.15* 0.90 1.03* 1.07* 0.96  CALCIUM 7.7*  --  7.8*  --  7.6* 7.6* 8.0*  MG  --   --  1.6*  --  1.6*  --   --    < > = values in this interval not displayed.   GFR: Estimated Creatinine Clearance: 33.5 mL/min (by C-G formula based on SCr of 0.96 mg/dL). Liver Function Tests: Recent Labs  Lab 01/18/20 0325 01/19/20 0753 01/21/20 0300  01/22/20 0250 01/23/20 0604  AST 69* 64* 68* 63* 58*  ALT 51* 48* 43 38 38  ALKPHOS 88 123 114 105 136*  BILITOT 1.4* 1.0 0.8 0.6 0.9  PROT 5.1* 5.4* 4.9* 4.6* 5.2*  ALBUMIN 1.4* 1.5* 1.4* 1.1* 1.2*   No results for input(s): LIPASE, AMYLASE in the last 168 hours. No results for input(s): AMMONIA in the last 168 hours. Coagulation Profile: No results for input(s): INR, PROTIME in the last 168 hours. Cardiac Enzymes: No results for input(s): CKTOTAL, CKMB, CKMBINDEX, TROPONINI in the last 168 hours. BNP (last 3 results) No results for input(s): PROBNP in the last 8760 hours. HbA1C: No results for input(s): HGBA1C in the last 72 hours. CBG: Recent Labs  Lab 01/22/20 1657 01/22/20 2033 01/23/20 0004 01/23/20 0454 01/23/20 0733  GLUCAP 73 91 102* 103* 90   Lipid Profile: No results for  input(s): CHOL, HDL, LDLCALC, TRIG, CHOLHDL, LDLDIRECT in the last 72 hours. Thyroid Function Tests: No results for input(s): TSH, T4TOTAL, FREET4, T3FREE, THYROIDAB in the last 72 hours. Anemia Panel: No results for input(s): VITAMINB12, FOLATE, FERRITIN, TIBC, IRON, RETICCTPCT in the last 72 hours. Urine analysis:    Component Value Date/Time   COLORURINE AMBER (A) 01/19/2020 0912   APPEARANCEUR CLEAR 01/19/2020 0912   LABSPEC 1.025 01/19/2020 0912   PHURINE 5.0 01/19/2020 0912   GLUCOSEU NEGATIVE 01/19/2020 0912   HGBUR MODERATE (A) 01/19/2020 0912   BILIRUBINUR NEGATIVE 01/19/2020 0912   KETONESUR NEGATIVE 01/19/2020 0912   PROTEINUR 30 (A) 01/19/2020 0912   UROBILINOGEN 0.2 09/08/2010 1330   NITRITE NEGATIVE 01/19/2020 0912   LEUKOCYTESUR NEGATIVE 01/19/2020 0912   Sepsis Labs: @LABRCNTIP (procalcitonin:4,lacticidven:4)  ) Recent Results (from the past 240 hour(s))  SARS Coronavirus 2 by RT PCR (hospital order, performed in [facility] hospital lab) Nasopharyngeal Nasopharyngeal Swab     Status: None   Collection Time: 01/15/20  7:00 PM   Specimen: Nasopharyngeal Swab  Result Value Ref Range Status   SARS Coronavirus 2 NEGATIVE NEGATIVE Final    Comment: (NOTE) SARS-CoV-2 target nucleic acids are NOT DETECTED.  The SARS-CoV-2 RNA is generally detectable in upper and lower respiratory specimens during the acute phase of infection. The lowest concentration of SARS-CoV-2 viral copies this assay can detect is 250 copies / mL. A negative result does not preclude SARS-CoV-2 infection and should not be used as the sole basis for treatment or other patient management decisions.  A negative result may occur with improper specimen collection / handling, submission of specimen other than nasopharyngeal swab, presence of viral mutation(s) within the areas targeted by this assay, and inadequate number of viral copies (<250 copies / mL). A negative result must be combined with  clinical observations, patient history, and epidemiological information.  Fact Sheet for Patients:   StrictlyIdeas.no  Fact Sheet for Healthcare Providers: BankingDealers.co.za  This test is not yet approved or  cleared by the [place] FDA and has been authorized for detection and/or diagnosis of SARS-CoV-2 by FDA under an Emergency Use Authorization (EUA).  This EUA will remain in effect (meaning this test can be used) for the duration of the COVID-19 declaration under Section 564(b)(1) of the Act, 21 U.S.C. section 360bbb-3(b)(1), unless the authorization is terminated or revoked sooner.  Performed at [facility], [place] [address], [place], [place] [address]   Culture, blood (Routine x 2)     Status: None   Collection Time: 01/15/20  7:01 PM   Specimen: BLOOD  Result Value  Ref Range Status   Specimen Description BLOOD SITE NOT SPECIFIED  Final   Special Requests   Final    BOTTLES DRAWN AEROBIC AND ANAEROBIC Blood Culture adequate volume   Culture   Final    NO GROWTH 5 DAYS Performed at [facility], [address], [place], [place] [address]    Report Status 01/20/2020 FINAL  Final  Culture, blood (Routine x 2)     Status: None   Collection Time: 01/15/20  7:48 PM   Specimen: BLOOD RIGHT FOREARM  Result Value Ref Range Status   Specimen Description BLOOD RIGHT FOREARM  Final   Special Requests   Final    BOTTLES DRAWN AEROBIC ONLY Blood Culture results may not be optimal due to an inadequate volume of blood received in culture bottles   Culture   Final    NO GROWTH 5 DAYS Performed at [facility], [place] [address] [place], [place] [address]    Report Status 01/20/2020 FINAL  Final  Urine culture     Status: Abnormal   Collection Time: 01/15/20  7:55 PM   Specimen: Urine, Random  Result Value Ref Range Status   Specimen Description URINE, RANDOM  Final   Special Requests   Final    NONE Performed  at [facility], [place] [address] [place], [place] [address]    Culture >=100,000 COLONIES/mL KLEBSIELLA PNEUMONIAE (A)  Final   Report Status 01/18/2020 FINAL  Final   Organism ID, Bacteria KLEBSIELLA PNEUMONIAE (A)  Final      Susceptibility   Klebsiella pneumoniae - MIC*    AMPICILLIN RESISTANT Resistant     CEFAZOLIN <=4 SENSITIVE Sensitive     CEFTRIAXONE <=0.25 SENSITIVE Sensitive     CIPROFLOXACIN <=0.25 SENSITIVE Sensitive     GENTAMICIN <=1 SENSITIVE Sensitive     IMIPENEM <=0.25 SENSITIVE Sensitive     NITROFURANTOIN 32 SENSITIVE Sensitive     TRIMETH/SULFA <=20 SENSITIVE Sensitive     AMPICILLIN/SULBACTAM <=2 SENSITIVE Sensitive     PIP/TAZO <=4 SENSITIVE Sensitive     * >=100,000 COLONIES/mL KLEBSIELLA PNEUMONIAE  MRSA PCR Screening     Status: None   Collection Time: 01/16/20  5:41 AM   Specimen: Nasal Mucosa; Nasopharyngeal  Result Value Ref Range Status   MRSA by PCR NEGATIVE NEGATIVE Final    Comment:        The GeneXpert MRSA Assay (FDA approved for NASAL specimens only), is one component of a comprehensive MRSA colonization surveillance program. It is not intended to diagnose MRSA infection nor to guide or monitor treatment for MRSA infections. Performed at [facility], [place] [address], [place], [place] [address]   Culture, Urine     Status: None   Collection Time: 01/19/20  9:12 AM   Specimen: Urine, Random  Result Value Ref Range Status   Specimen Description URINE, RANDOM  Final   Special Requests NONE  Final   Culture   Final    NO GROWTH Performed at [facility], [place] [address] [place], [place] [address]    Report Status 01/20/2020 FINAL  Final  Culture, blood (routine x 2)     Status: None (Preliminary result)   Collection Time: 01/21/20  9:52 AM   Specimen: BLOOD  Result Value Ref Range Status   Specimen Description BLOOD RIGHT ANTECUBITAL  Final   Special Requests   Final    BOTTLES DRAWN AEROBIC AND ANAEROBIC Blood  Culture adequate  volume   Culture   Final    NO GROWTH 2 DAYS Performed at [facility], [place] [address], [place], [place] [address]    Report Status PENDING  Incomplete  Culture, blood (routine x 2)     Status: None (Preliminary result)   Collection Time: 01/21/20 10:02 AM   Specimen: BLOOD RIGHT HAND  Result Value Ref Range Status   Specimen Description BLOOD RIGHT HAND  Final   Special Requests   Final    BOTTLES DRAWN AEROBIC ONLY Blood Culture results may not be optimal due to an inadequate volume of blood received in culture bottles   Culture   Final    NO GROWTH 2 DAYS Performed at [facility], [place] [address] [place], [place] [address]    Report Status PENDING  Incomplete         Radiology Studies: CT CHEST W CONTRAST  Result Date: 01/21/2020 CLINICAL DATA:  Cough and persistent weight loss. EXAM: CT CHEST WITH CONTRAST TECHNIQUE: Multidetector CT imaging of the chest was performed during intravenous contrast administration. CONTRAST:  91mL OMNIPAQUE IOHEXOL 300 MG/ML  SOLN COMPARISON:  April 23, 2018 FINDINGS: Cardiovascular: There is mild calcification of the aortic arch. Normal heart size. No pericardial effusion. Mediastinum/Nodes: No enlarged mediastinal, hilar, or axillary lymph nodes. A 1.4 cm x 1.2 cm focus of parenchymal low attenuation is seen within the left lobe of the thyroid gland. The trachea and esophagus demonstrate no significant findings. Lungs/Pleura: The 4 mm noncalcified lung nodule is seen within the posterolateral aspect of the left upper lobe (axial CT image 51, CT series number 4). Mild posterior bilateral upper lobe atelectasis is present. Moderate to marked severity bibasilar atelectasis and/or infiltrate is seen. There are small bilateral pleural effusions. No pneumothorax is identified. Upper Abdomen: There is a mild amount of perihepatic and perisplenic fluid. Musculoskeletal: Moderate to marked severity endplate sclerosis is seen at the  level of T1-T2. IMPRESSION: 1. Moderate to marked severity bibasilar atelectasis and/or infiltrate. 2. Small bilateral pleural effusions. 3. 4 mm noncalcified lung nodule within the left upper lobe. Correlation with 12 month follow-up chest CT is recommended to determine stability. 4. Moderate to marked severity endplate sclerosis at the level of T1-T2. This may represent sequelae associated with degenerative disc disease. Correlation with MRI is recommended. 5. A 1.4 cm x 1.2 cm focus of parenchymal low attenuation is seen within the left lobe of the thyroid gland. Recommend further evaluation with thyroid ultrasound. 6. Mild amount of perihepatic and perisplenic fluid. 7. Aortic atherosclerosis. Aortic Atherosclerosis (ICD10-I70.0). Electronically Signed   By: Virgina Norfolk [name]   On: 01/21/2020 19:43   DG ESOPHAGUS W SINGLE CM (SOL OR THIN BA)  Result Date: 01/22/2020 CLINICAL DATA:  100 pound weight loss. EXAM: ESOPHOGRAM/BARIUM SWALLOW TECHNIQUE: Single contrast examination was performed using  thin barium. FLUOROSCOPY TIME:  Fluoroscopy Time:  1 minutes Radiation Exposure Index (if provided by the fluoroscopic device): 37.4 mGy Number of Acquired Spot Images: 0 COMPARISON:  None. FINDINGS: Due to patient immobility, imaging was performed with the patient RPO and LPO relative to the fluoro table. Fluoro table was positioned at 30 degrees head up. Patient was given sips of thin barium. This demonstrates no evidence for gross esophageal web, stricture, mass lesion, or gross exophytic mucosal ulceration. No evidence for esophageal diverticulum. There are some mild tertiary contractions in the distal esophagus without overt presbyesophagus. IMPRESSION: No gross esophageal abnormality on this limited single contrast exam. Electronically  Signed   By: Misty Stanley [name]   On: 01/22/2020 16:06   Scheduled Meds: . amiodarone  200 mg Oral BID  . Chlorhexidine Gluconate Cloth  6 each Topical Daily  .  collagenase   Topical Daily  . docusate sodium  100 mg Oral BID  . mouth rinse  15 mL Mouth Rinse BID  . sodium chloride flush  3 mL Intravenous Q12H    LOS: 7 days   Time spent: 70min  Domenic Polite, MD [facility]  01/23/2020, 1:48 PM

## 2020-01-23 NOTE — Progress Notes (Signed)
PT Cancellation Note  Patient Details Name: Kerry Barnes MRN: [id number] DOB: 06-Nov-1933   Cancelled Treatment:    Reason Eval/Treat Not Completed: PT screened, no needs identified, will sign off - Per RN, pt transitioning to comfort care. PT to sign off, please reconsult if needed.  [place] Pager [phone]  Office [phone]    [name] 01/23/2020, 10:01 AM

## 2020-01-23 NOTE — Consult Note (Signed)
Consultation Note Date: 01/23/2020   Patient Name: Kerry Barnes  DOB: 1934/05/19  MRN: [id number]  Age / Sex: 86 y.o., female  PCP: Aletha Halim., PA-C Referring Physician: Domenic Polite, MD  Reason for Consultation: Establishing goals of care and Psychosocial/spiritual support  HPI/Patient Profile: 86 y.o. female  admitted on 01/15/2020 with past medical history significant of CKD stage III, atrial fibrillation on anticoagulation, asthma, pulmonary hypertension, chronic diastolic hypertension, diabetes mellitus type 2, HLD, HTN, gastroparesis IBS, asthma mated through the emergency room with generalized fatigue and diarrhea.  Patient was seen in the ED on 7/30 for complaints of bilateral lower extremity pain.  Bilateral extremity ultrasound negative for DVT.  Patient was given gabapentin for neuropathy and discharged home.   Patient with overall failure to thrive, weight loss, chest x-ray significant for pneumonia, elevated LFTs, hypoalbuminemia, generalized weakness and fatigue.   Today is day 7 of this hospitalization.  Continued decline in the context of maximum medical treatments.   Patient lives at home with her husband who is her main caregiver.   Husband  reports dramatic physical, functional and cognitive decline over the past 6 months.  They report significant weight loss, poor p.o. intake, decreased ambulation. She presents with a unstageable pressure injury to the sacrum.    Family face treatment option decisions, advanced directive decisions and anticipatory care needs.  Clinical Assessment and Goals of Care:  This NP Wadie Lessen reviewed medical records, received report from team, assessed the patient and then meet at the patient's bedside along with her husband/Kelly Heumann and his two daughters Kathyrn Sheriff [phone] and Theodoro Grist  [phone] to discuss diagnosis,  prognosis, GOC, EOL wishes disposition and options.   Concept of Palliative Care was introduced as specialized medical care for people and their families living with serious illness.  If focuses on providing relief from the symptoms and stress of a serious illness.  The goal is to improve quality of life for both the patient and the family.  Created space and opportunity for husband to explore his thoughts and feelings regarding his wife's current medical situation.  He verbalizes an understanding of her physical decline.  He reports increasing nursing care needs at home.    Family present today verbalize their desire that main focus of care focus on comfort and dignity.   A  discussion was had today regarding advanced directives.  Concepts specific to code status, artifical feeding and hydration, continued IV antibiotics and rehospitalization was had.  The difference between a aggressive medical intervention path  and a palliative comfort care path for this patient at this time was had.  Values and goals of care important to patient and family were attempted to be elicited.  Left message for daughter Andee Poles  [phone] await callback.      MOST form completed to reflect comfort.  Pink copy placed in chart.  Copy sent to medical records for scanning.   Natural trajectory and expectations at EOL were discussed.  Questions and concerns addressed.  Patient  encouraged to call with questions or concerns.     PMT will continue to support holistically.       Later in the afternoon I got a return phone call from patient's daughter Raynald Blend.  She tells me she is the oldest of the patient's 7 children from first marriage.  Joseph Art tells me that she speaks to her mother by phone on a regular basis but has not seen her mother in person for close to a year.  Joseph Art tells me that the 2 families have been estranged, patient has been married approximately 14 years to Mr. Mariama Saintvil.  Joseph Art tells me  that there is minimal conversation between families. I encouraged Renee to call Mr. Nicolaou for more detailed information, he did give me permission today to share all information with [facility] as requested.  I attempted to answer all questions and concerns to the best my ability.  Emotional support offered to all   No documented H POA or advanced directive.  Patient's husband is present, he is the patient's main support and decision maker.   SUMMARY OF RECOMMENDATIONS    Code Status/Advance Care Planning:  DNR-documetned today   no artifcial feeding or hydration now or in the future  no further daignsotics- focus of care is comfort and dignity   Diet as tolerated  Convert IV medications to oral   Discussed with Dr Broadus John  converting IV antibiotics to oral meds  Foley catheter for end-of-life care  Evaluate in the morning for appropriate transition of care residential hospice versus home with hospice.   Symptom Management:   Roxanol 5 mg p.o./sublingual every 3 hours as needed for pain or difficulty breathing.  Palliative Prophylaxis:   Aspiration, Bowel Regimen, Delirium Protocol, Frequent Pain Assessment, Oral Care and Palliative Wound Care  Additional Recommendations (Limitations, Scope, Preferences):  Full Comfort Care  Psycho-social/Spiritual:   Desire for further Chaplaincy support:yes  Additional Recommendations: Education on Hospice  Prognosis:   Will reassess in the morning for more accurate prognosis  Discharge Planning: To Be Determined      Primary Diagnoses: Present on Admission: . Elevated LFTs . AKI (acute kidney injury) ([place]) . Asthma . Atrial flutter, paroxysmal ([place]) . Chronic diastolic heart failure ([place]) . Chronic renal insufficiency, stage III (moderate) ([facility]) . Essential hypertension . Gastroparesis . Type 2 diabetes with stage 3 chronic kidney disease GFR 30-59 ([facility]) . Acute lower UTI . Sacral decubitus ulcer . Sepsis ([place]) .  Anemia . Hypotension   I have reviewed the medical record, interviewed the patient and family, and examined the patient. The following aspects are pertinent.  Past Medical History:  Diagnosis Date  . Anxiety   . Arthritis   . Asthma   . Atypical atrial flutter ([place])   . Cataract   . Chronic diastolic CHF (congestive heart failure) ([place])   . Fibromyalgia   . GERD (gastroesophageal reflux disease)   . History of blood transfusion   . Hyperlipemia   . Hypertension   . IBS (irritable bowel syndrome)   . Seasonal allergies   . Stroke [facility]) 2013   TIA  . Tubular adenoma of colon   . Type 2 diabetes mellitus ([place])    Social History   Socioeconomic History  . Marital status: Married    Spouse name: Not on file  . Number of children: 8  . Years of education: Not on file  . Highest education level: Not on file  Occupational History  . Occupation: [employer]  Employer: RETIRED  Tobacco Use  . Smoking status: Never Smoker  . Smokeless tobacco: Never Used  Vaping Use  . Vaping Use: Never used  Substance and Sexual Activity  . Alcohol use: No    Alcohol/week: 0.0 standard drinks  . Drug use: No  . Sexual activity: Never    Partners: Male  Other Topics Concern  . Not on file  Social History Narrative   Grandchildren 14      Great Grandchildren 13   Social Determinants of Health   Financial Resource Strain:   . Difficulty of Paying Living Expenses:   Food Insecurity:   . Worried About Charity fundraiser in the Last Year:   . Arboriculturist in the Last Year:   Transportation Needs:   . Film/video editor (Medical):   Marland Kitchen Lack of Transportation (Non-Medical):   Physical Activity:   . Days of Exercise per Week:   . Minutes of Exercise per Session:   Stress:   . Feeling of Stress :   Social Connections:   . Frequency of Communication with Friends and Family:   . Frequency of Social Gatherings with Friends and Family:   . Attends Religious Services:   .  Active Member of Clubs or Organizations:   . Attends Archivist Meetings:   Marland Kitchen Marital Status:    Family History  Problem Relation Age of Onset  . Diabetes Mother   . Hypertension Mother   . Diabetes Father   . Diabetes Sister   . Diabetes Brother   . Hypertension Brother   . Colon cancer Neg Hx   . Stomach cancer Neg Hx    Scheduled Meds: . amiodarone  200 mg Oral BID  . Chlorhexidine Gluconate Cloth  6 each Topical Daily  . collagenase   Topical Daily  . docusate sodium  100 mg Oral BID  . feeding supplement (ENSURE ENLIVE)  237 mL Oral TID BM  . mouth rinse  15 mL Mouth Rinse BID  . sodium chloride flush  3 mL Intravenous Q12H   Continuous Infusions: PRN Meds:.acetaminophen, albuterol, docusate sodium, ondansetron **OR** ondansetron (ZOFRAN) IV, polyethylene glycol Medications Prior to Admission:  Prior to Admission medications   Medication Sig Start Date End Date Taking? Authorizing Provider  acetaminophen (TYLENOL) 325 MG tablet Take 650 mg by mouth every 6 (six) hours as needed for moderate pain.    Yes [provider]  albuterol (PROVENTIL HFA;VENTOLIN HFA) 108 (90 BASE) MCG/ACT inhaler Inhale 2 puffs into the lungs every 6 (six) hours as needed for wheezing or shortness of breath.    Yes [provider]  amiodarone (PACERONE) 200 MG tablet TAKE 1 TABLET BY MOUTH TWICE DAILY Patient taking differently: Take 200 mg by mouth 2 (two) times daily.  08/15/19  Yes Patwardhan, Manish J, MD  amLODipine (NORVASC) 5 MG tablet Take 5 mg by mouth daily.  10/17/18  Yes [provider]  atorvastatin (LIPITOR) 40 MG tablet Take 40 mg by mouth daily.   Yes [provider]  ELIQUIS 2.5 MG TABS tablet TAKE 1 TABLET(2.5 MG) BY MOUTH TWICE DAILY Patient taking differently: Take 2.5 mg by mouth 2 (two) times daily.  01/10/20  Yes Patwardhan, Manish J, MD  EPINEPHrine (EPI-PEN) 0.3 mg/0.3 mL SOAJ injection Inject 0.3 mg once as needed into the muscle  (severe allergic reaction).    Yes [provider]  gabapentin (NEURONTIN) 100 MG capsule Take 1 capsule (100 mg total) by mouth  3 (three) times daily. 01/12/20  Yes Deno Etienne, DO  ibandronate (BONIVA) 150 MG tablet Take 150 mg by mouth every 30 (thirty) days. 10/25/19  Yes [provider]  mometasone (NASONEX) 50 MCG/ACT nasal spray Place 2 sprays into the nose daily as needed (nasal congestion).    Yes [provider]  Propylene Glycol (SYSTANE BALANCE OP) Place 1 drop daily as needed into both eyes (dryness).    Yes [provider]  solifenacin (VESICARE) 5 MG tablet Take 5 mg by mouth daily. 09/02/17  Yes [provider]  trimethoprim-polymyxin b (POLYTRIM) ophthalmic solution Place 1 drop into the left eye 4 (four) times daily. 02/03/18  Yes [provider]  LINZESS 290 MCG CAPS capsule TAKE 1 CAPSULE BY MOUTH DAILY BEFORE BREAKFAST Patient not taking: Reported on 01/16/2020 07/27/19   Pyrtle, Lajuan Lines, MD  methocarbamol (ROBAXIN) 500 MG tablet Take 1 tablet (500 mg total) every 6 (six) hours as needed by mouth for muscle spasms. Patient not taking: Reported on 01/16/2020 04/27/17   Geradine Girt, DO   Allergies  Allergen Reactions  . Codeine Nausea Only  . Metformin Diarrhea  . Amoxicillin Rash  . Biaxin [Clarithromycin] Rash  . Ciprofloxacin Rash  . Penicillins Rash    Childhood allergic reaction Has patient had a PCN reaction causing immediate rash, facial/tongue/throat swelling, SOB or lightheadedness with hypotension: Yes Has patient had a PCN reaction causing severe rash involving mucus membranes or skin necrosis: No Has patient had a PCN reaction that required hospitalization: Yes Has patient had a PCN reaction occurring within the last 10 years: No If all of the above answers are "NO", then may proceed with Cephalosporin use.   Review of Systems  Unable to perform ROS: Dementia    Physical Exam Constitutional:      Appearance:  She is cachectic. She is ill-appearing.  Cardiovascular:     Rate and Rhythm: Tachycardia present.  Neurological:     Mental Status: She is lethargic.     Vital Signs: BP (!) 97/54 (BP Location: Right Arm)   Pulse 100   Temp 98.1 F (36.7 C)   Resp 16   Ht 5\' 1"  (1.549 m)   Wt 52 kg   SpO2 98%   BMI 21.66 kg/m  Pain Scale: 0-10   Pain Score: Asleep   SpO2: SpO2: 98 % O2 Device:SpO2: 98 % O2 Flow Rate: .O2 Flow Rate (L/min): 2 L/min  IO: Intake/output summary:   Intake/Output Summary (Last 24 hours) at 01/23/2020 0953 Last data filed at 01/23/2020 3202 Gross per 24 hour  Intake 481.19 ml  Output 850 ml  Net -368.81 ml    LBM: Last BM Date: 01/22/20 Baseline Weight: Weight: 46.7 kg Most recent weight: Weight: 52 kg     Palliative Assessment/Data: 30 % at best   Discussed with Dr Broadus John  Time In: 0900 Time Out: 1015  Time Total: 75 minutes Greater than 50%  of this time was spent counseling and coordinating care related to the above assessment and plan.  Signed by: Wadie Lessen, NP   Please contact Palliative Medicine Team phone at [phone] for questions and concerns.  For individual provider: See Shea Evans

## 2020-01-23 NOTE — Plan of Care (Signed)
  Problem: Clinical Measurements: Goal: Cardiovascular complication will be avoided Outcome: Not Applicable

## 2020-01-23 NOTE — Progress Notes (Signed)
Patient ID: Kerry Barnes, female   DOB: Sep 24, 1933, 86 y.o.   MRN: [id number] Evaluated wound today, has a lot of loose stool. I dont think wound is source of infection and no real indication for debridement right now. There is black eschar but no infection. I dont think an anesthetic is in best interest at this point and palliative care is likely the best plan for her.  Will sign off

## 2020-01-24 DIAGNOSIS — E43 Unspecified severe protein-calorie malnutrition: Secondary | ICD-10-CM

## 2020-01-24 NOTE — Progress Notes (Signed)
PROGRESS NOTE  Kerry Barnes GXQ:[id number] DOB: [birth year] DOA: 01/15/2020 PCP: Aletha Halim., PA-C   LOS: 8 days   Brief narrative: As per HPI,   86 year old female with PMHx of CKD III, atrial fibrillation on Eliquis, chronic diastolic heart failure, hypertension, hyperlipidemia, and diabetes mellitus presented to the hospital for worsening weakness, FTT. She was noted to be hypotensive in the ED, admitted to ICU treated with IV fluids, Abx, blood pressure stabilized. CT abdomen pelvis noted lower lobe consolidation and changing caliber of the colon suggesting possible focal lesion.Transferred to [facility] service 8/4. Patient was noted to have severe hypoalbuminemia, unstagable sacral wound, UTI. GI was consulted.  Assessment/Plan:  Active Problems:   Type 2 diabetes with stage 3 chronic kidney disease GFR 30-59 (HCC)   Essential hypertension   Chronic diastolic heart failure (HCC)   Gastroparesis   Atrial flutter, paroxysmal (HCC)   Chronic renal insufficiency, stage III (moderate) (HCC)   Asthma   AKI (acute kidney injury) ([place])   Elevated LFTs   Acute lower UTI   Sacral decubitus ulcer   Sepsis (HCC)   Anemia   Hypotension   Protein-calorie malnutrition, severe   Abnormal CT scan, colon   Adult failure to thrive   Palliative care by specialist   DNR (do not resuscitate)   Severe failure to thrive, weight loss, debility, severe hypoalbuminemia, anemia, Severe malnutrition -Patient has had at least 6 to 12 months of decline.  Patient underwent colonoscopy on 01/20/2020 with normal findings.  GI has signed off.  CT scan of the chest was unremarkable.  Right upper quadrant ultrasound noted some abnormal liver echotexture.  Hepatitis C was negative.  Ferritin more than 2000 likely to be hemochromatosis at this time..  TSH unremarkable.  Overall prognosis of the patient is poor due to ongoing debility, bedbound status, severe malnutrition, sacral decubitus ulceration.   Palliative care on board and plan is possible hospice care.  Multifocal pneumonia/bilateral pleural effusions -CT on admission noted with bilateral lower lobe consolidation.  4 mm noncalcified lung nodule will need 12 months CT scan follow-up.  Patient was treated with IV Levaquin and was subsequently changed to Rocephin for UTI and wound infection.  Blood cultures have been negative so far.  Urinary Legionella and pneumococcal antigen are negative.  No evidence of aspiration on swallow test.  On room air.  Klebsiella UTI/Recurrent urinary retention Blood cultures negative in 3 days.  Still with significant leukocytosis.  Continue ceftriaxone for at least 1 more day. Foley catheter placed 01/20/20 due to recurrent urinary retention, also has a necrotic sacral decubitus wound  Unstageable sacral decubitus wound -With black eschar , no abscess on CT scan of the pelvis.  Patient was seen by general surgery and with severe hypoalbuminemia and bedbound status unlikely to heal with surgical intervention.  Surgery recommended local wound care.  Wound care on board.  Metabolic encephalopathy Improved.  Likely secondary infection/medications.  Off gabapentin  Iron deficiency anemia -Received IV iron on 01/18/2020.  Colonoscopy on 01/20/2020 was unremarkable. Barium esophagram showed no gross esophageal abnormality.  Hypotension -Likely chronic.  Cultures negative.  On midodrine.    Paroxysmal atrial fibrillation -Currently in sinus rhythm.  Eliquis was resumed after colonoscopy.  Has been discontinued after goals of care discussion.  On amiodarone.    Acute on chronic diastolic CHF 2D Echo with diastolic dysfunction, preserved EF and moderate mitral regurgitation.  As needed Lasix.  Hypokalemia Replenished.  Latest potassium of 4.6.  Type  2 diabetes mellitus Latest hemoglobin A1c of 5.1.  Continue sliding scale insulin, Accu-Cheks diabetic diet.  Januvia on hold  Ethics/goal of care  discussion Patient with overall poor prognosis.  Palliative care on board.  Patient is currently DNR.  Palliative care holding discussion with the family regarding hospice level of care.  DVT prophylaxis: Off Eliquis  Code Status: DNR  Family Communication: Palliative care communicating with the family  Status is: Inpatient  Remains inpatient appropriate because:Inpatient level of care appropriate due to severity of illness   Dispo: The patient is from: Home              Anticipated d/c is to: Undetermined.  Hospice likely              Anticipated d/c date is: 2 days              Patient currently is not medically stable to d/c.  Consultants:  GI  PCCM  Palliative care  Procedures: Colonoscopy 01/20/2020 Foley catheter placed 01/20/20   Antibiotics:  . None  Anti-infectives (From admission, onward)   Start     Dose/Rate Route Frequency Ordered Stop   01/20/20 1000  cefTRIAXone (ROCEPHIN) 2 g in sodium chloride 0.9 % 100 mL IVPB        2 g 200 mL/hr over 30 Minutes Intravenous Every 24 hours 01/20/20 0924 01/22/20 1044   01/16/20 2300  vancomycin (VANCOREADY) IVPB 500 mg/100 mL  Status:  Discontinued        500 mg 100 mL/hr over 60 Minutes Intravenous Every 24 hours 01/15/20 2224 01/16/20 1028   01/16/20 1200  levofloxacin (LEVAQUIN) IVPB 500 mg  Status:  Discontinued        500 mg 100 mL/hr over 60 Minutes Intravenous Every 48 hours 01/16/20 1028 01/20/20 0924   01/15/20 2230  ceFEPIme (MAXIPIME) 2 g in sodium chloride 0.9 % 100 mL IVPB  Status:  Discontinued        2 g 200 mL/hr over 30 Minutes Intravenous  Once 01/15/20 2221 01/15/20 2224   01/15/20 2230  vancomycin (VANCOCIN) IVPB 1000 mg/200 mL premix        1,000 mg 200 mL/hr over 60 Minutes Intravenous  Once 01/15/20 2221 01/16/20 0130   01/15/20 2230  ceFEPIme (MAXIPIME) 2 g in sodium chloride 0.9 % 100 mL IVPB  Status:  Discontinued        2 g 200 mL/hr over 30 Minutes Intravenous Every 24 hours 01/15/20 2224  01/16/20 1028   01/15/20 2215  metroNIDAZOLE (FLAGYL) IVPB 500 mg  Status:  Discontinued        500 mg 100 mL/hr over 60 Minutes Intravenous Every 8 hours 01/15/20 2212 01/16/20 1030   01/15/20 2145  cefTRIAXone (ROCEPHIN) 1 g in sodium chloride 0.9 % 100 mL IVPB        1 g 200 mL/hr over 30 Minutes Intravenous  Once 01/15/20 2140 01/15/20 2219     Subjective: Today, patient was seen and examined at bedside.  Patient states that she feels okay except for some back pain especially on moving.  Shortness of breath, cough, fever or chills.  Objective: Vitals:   01/23/20 0919 01/24/20 0852  BP: (!) 97/54 99/60  Pulse: 100 89  Resp: 16 17  Temp: 98.1 F (36.7 C) 98 F (36.7 C)  SpO2: 98% 97%    Intake/Output Summary (Last 24 hours) at 01/24/2020 1411 Last data filed at 01/24/2020 1300 Gross per 24 hour  Intake 120  ml  Output 500 ml  Net -380 ml   Filed Weights   01/20/20 2118 01/21/20 2021 01/22/20 0430  Weight: 51.2 kg 52 kg 52 kg   Body mass index is 21.66 kg/m.   Physical Exam: GENERAL: Patient is alert awake and communicative not in obvious distress.  Cachectic, HENT: No scleral pallor or icterus. Pupils equally reactive to light. Oral mucosa is moist NECK: is supple, no gross swelling noted. CHEST: Clear to auscultation. No crackles or wheezes.  Diminished breath sounds bilaterally. CVS: S1 and S2 heard, no murmur. Regular rate and rhythm.  ABDOMEN: Soft, non-tender, bowel sounds are present. EXTREMITIES: Trace edema CNS: Cranial nerves are intact.  Moving extremities. SKIN: warm and dry without rashes.  Sacral decubitus ulceration present on admission  Data Review: I have personally reviewed the following laboratory data and studies,  CBC: Recent Labs  Lab 01/19/20 0753 01/19/20 0753 01/20/20 0220 01/20/20 1136 01/21/20 0300 01/22/20 0250 01/23/20 0604  WBC 21.8*  --  21.2*  --  22.5* 18.5* 17.4*  HGB 8.4*   < > 8.0* 10.9* 7.7* 7.0* 7.5*  HCT 25.0*   < >  24.4* 32.0* 23.8* 21.6* 22.4*  MCV 79.6*  --  78.5*  --  79.9* 80.6 79.7*  PLT 285  --  300  --  280 280 289   < > = values in this interval not displayed.   Basic Metabolic Panel: Recent Labs  Lab 01/19/20 0753 01/19/20 0753 01/20/20 0220 01/20/20 1136 01/21/20 0300 01/22/20 0250 01/23/20 0604  NA 138   < > 141 144 144 140 138  K 3.0*   < > 2.7* 4.0 2.7* 3.5 4.6  CL 109   < > 109 115* 111 110 107  CO2 19*  --  19*  --  20* 21* 22  GLUCOSE 93   < > 146* 99 105* 135* 106*  BUN 17   < > 17 21 14 12 15   CREATININE 1.12*   < > 1.15* 0.90 1.03* 1.07* 0.96  CALCIUM 7.7*  --  7.8*  --  7.6* 7.6* 8.0*  MG  --   --  1.6*  --  1.6*  --   --    < > = values in this interval not displayed.   Liver Function Tests: Recent Labs  Lab 01/18/20 0325 01/19/20 0753 01/21/20 0300 01/22/20 0250 01/23/20 0604  AST 69* 64* 68* 63* 58*  ALT 51* 48* 43 38 38  ALKPHOS 88 123 114 105 136*  BILITOT 1.4* 1.0 0.8 0.6 0.9  PROT 5.1* 5.4* 4.9* 4.6* 5.2*  ALBUMIN 1.4* 1.5* 1.4* 1.1* 1.2*   No results for input(s): LIPASE, AMYLASE in the last 168 hours. No results for input(s): AMMONIA in the last 168 hours. Cardiac Enzymes: No results for input(s): CKTOTAL, CKMB, CKMBINDEX, TROPONINI in the last 168 hours. BNP (last 3 results) Recent Labs    01/21/20 0300  BNP 342.8*    ProBNP (last 3 results) No results for input(s): PROBNP in the last 8760 hours.  CBG: Recent Labs  Lab 01/22/20 1657 01/22/20 2033 01/23/20 0004 01/23/20 0454 01/23/20 0733  GLUCAP 73 91 102* 103* 90   Recent Results (from the past 240 hour(s))  SARS Coronavirus 2 by RT PCR (hospital order, performed in [facility] hospital lab) Nasopharyngeal Nasopharyngeal Swab     Status: None   Collection Time: 01/15/20  7:00 PM   Specimen: Nasopharyngeal Swab  Result Value Ref Range Status   SARS Coronavirus  2 NEGATIVE NEGATIVE Final    Comment: (NOTE) SARS-CoV-2 target nucleic acids are NOT DETECTED.  The SARS-CoV-2 RNA  is generally detectable in upper and lower respiratory specimens during the acute phase of infection. The lowest concentration of SARS-CoV-2 viral copies this assay can detect is 250 copies / mL. A negative result does not preclude SARS-CoV-2 infection and should not be used as the sole basis for treatment or other patient management decisions.  A negative result may occur with improper specimen collection / handling, submission of specimen other than nasopharyngeal swab, presence of viral mutation(s) within the areas targeted by this assay, and inadequate number of viral copies (<250 copies / mL). A negative result must be combined with clinical observations, patient history, and epidemiological information.  Fact Sheet for Patients:   StrictlyIdeas.no  Fact Sheet for Healthcare Providers: BankingDealers.co.za  This test is not yet approved or  cleared by the [place] FDA and has been authorized for detection and/or diagnosis of SARS-CoV-2 by FDA under an Emergency Use Authorization (EUA).  This EUA will remain in effect (meaning this test can be used) for the duration of the COVID-19 declaration under Section 564(b)(1) of the Act, 21 U.S.C. section 360bbb-3(b)(1), unless the authorization is terminated or revoked sooner.  Performed at [facility], [place] [address] [place], [place] [address]   Culture, blood (Routine x 2)     Status: None   Collection Time: 01/15/20  7:01 PM   Specimen: BLOOD  Result Value Ref Range Status   Specimen Description BLOOD SITE NOT SPECIFIED  Final   Special Requests   Final    BOTTLES DRAWN AEROBIC AND ANAEROBIC Blood Culture adequate volume   Culture   Final    NO GROWTH 5 DAYS Performed at [facility], [place] [address], [place], [place] [address]    Report Status 01/20/2020 FINAL  Final  Culture, blood (Routine x 2)     Status: None   Collection Time: 01/15/20  7:48 PM   Specimen:  BLOOD RIGHT FOREARM  Result Value Ref Range Status   Specimen Description BLOOD RIGHT FOREARM  Final   Special Requests   Final    BOTTLES DRAWN AEROBIC ONLY Blood Culture results may not be optimal due to an inadequate volume of blood received in culture bottles   Culture   Final    NO GROWTH 5 DAYS Performed at [facility], [place] [address], [place], [place] [address]    Report Status 01/20/2020 FINAL  Final  Urine culture     Status: Abnormal   Collection Time: 01/15/20  7:55 PM   Specimen: Urine, Random  Result Value Ref Range Status   Specimen Description URINE, RANDOM  Final   Special Requests   Final    NONE Performed at [facility], [place] [address], [place], [place] [address]    Culture >=100,000 COLONIES/mL KLEBSIELLA PNEUMONIAE (A)  Final   Report Status 01/18/2020 FINAL  Final   Organism ID, Bacteria KLEBSIELLA PNEUMONIAE (A)  Final      Susceptibility   Klebsiella pneumoniae - MIC*    AMPICILLIN RESISTANT Resistant     CEFAZOLIN <=4 SENSITIVE Sensitive     CEFTRIAXONE <=0.25 SENSITIVE Sensitive     CIPROFLOXACIN <=0.25 SENSITIVE Sensitive     GENTAMICIN <=1 SENSITIVE Sensitive     IMIPENEM <=0.25 SENSITIVE Sensitive     NITROFURANTOIN 32 SENSITIVE Sensitive     TRIMETH/SULFA <=20 SENSITIVE Sensitive  AMPICILLIN/SULBACTAM <=2 SENSITIVE Sensitive     PIP/TAZO <=4 SENSITIVE Sensitive     * >=100,000 COLONIES/mL KLEBSIELLA PNEUMONIAE  MRSA PCR Screening     Status: None   Collection Time: 01/16/20  5:41 AM   Specimen: Nasal Mucosa; Nasopharyngeal  Result Value Ref Range Status   MRSA by PCR NEGATIVE NEGATIVE Final    Comment:        The GeneXpert MRSA Assay (FDA approved for NASAL specimens only), is one component of a comprehensive MRSA colonization surveillance program. It is not intended to diagnose MRSA infection nor to guide or monitor treatment for MRSA infections. Performed at [facility], [place] [address] [place], [place] [address]    Culture, Urine     Status: None   Collection Time: 01/19/20  9:12 AM   Specimen: Urine, Random  Result Value Ref Range Status   Specimen Description URINE, RANDOM  Final   Special Requests NONE  Final   Culture   Final    NO GROWTH Performed at [facility], [place] [address] [place], [place] [address]    Report Status 01/20/2020 FINAL  Final  Culture, blood (routine x 2)     Status: None (Preliminary result)   Collection Time: 01/21/20  9:52 AM   Specimen: BLOOD  Result Value Ref Range Status   Specimen Description BLOOD RIGHT ANTECUBITAL  Final   Special Requests   Final    BOTTLES DRAWN AEROBIC AND ANAEROBIC Blood Culture adequate volume   Culture   Final    NO GROWTH 3 DAYS Performed at [facility], [place] [address], [place], [place] [address]    Report Status PENDING  Incomplete  Culture, blood (routine x 2)     Status: None (Preliminary result)   Collection Time: 01/21/20 10:02 AM   Specimen: BLOOD RIGHT HAND  Result Value Ref Range Status   Specimen Description BLOOD RIGHT HAND  Final   Special Requests   Final    BOTTLES DRAWN AEROBIC ONLY Blood Culture results may not be optimal due to an inadequate volume of blood received in culture bottles   Culture   Final    NO GROWTH 3 DAYS Performed at [facility], [place] [address] [place], [place] [address]    Report Status PENDING  Incomplete     Studies: DG ESOPHAGUS W SINGLE CM (SOL OR THIN BA)  Result Date: 01/22/2020 CLINICAL DATA:  100 pound weight loss. EXAM: ESOPHOGRAM/BARIUM SWALLOW TECHNIQUE: Single contrast examination was performed using  thin barium. FLUOROSCOPY TIME:  Fluoroscopy Time:  1 minutes Radiation Exposure Index (if provided by the fluoroscopic device): 37.4 mGy Number of Acquired Spot Images: 0 COMPARISON:  None. FINDINGS: Due to patient immobility, imaging was performed with the patient RPO and LPO relative to the fluoro table. Fluoro table was positioned at 30 degrees head up.  Patient was given sips of thin barium. This demonstrates no evidence for gross esophageal web, stricture, mass lesion, or gross exophytic mucosal ulceration. No evidence for esophageal diverticulum. There are some mild tertiary contractions in the distal esophagus without overt presbyesophagus. IMPRESSION: No gross esophageal abnormality on this limited single contrast exam. Electronically Signed   By: Misty Stanley [name]   On: 01/22/2020 16:06      Flora Lipps, MD  [facility] 01/24/2020

## 2020-01-24 NOTE — Progress Notes (Signed)
Manufacturing engineer [facility])  Referral received for residential hospice at [facility].  Met with family and PMT at the bedside.  There is an waiting list at [facility] currently.    Discussed discharging the pt home with hospice and transferring to [facility] once a bed is available.  Husband is open to this, but is debilitated himself and this does not feel like the safest/best solution.   Priority is to d/c to [facility].  If there is not a bed by Friday am, husband is in agreement with sending her home. If this is the case, DME will have to be ordered Friday morning prior to her d/c home.  Again, [employer] [facility] is preference.  Discussed with [facility].  Venia Carbon BSN, RN, [place] [facility] Liaison

## 2020-01-24 NOTE — Progress Notes (Signed)
  Patient ID: GENOVA KINER, female   DOB: Oct 06, 1933, 86 y.o.   MRN: [id number]  This NP visited patient at the bedside as a follow up to  yesterday's [facility], and for palliative medicine needs and emotional support.  Patient is more lethargics today, with poor po intake,  Prognosis is likely days to weeks.  Spoke with Mr Klas this morning at bedside. He understands the limited prognosis and hope is for comfort for his wife.  We discussed that there are no beds available at [facility] today and maybe not for several days.  Family wish to take her home with hospice services and transport to [place]. Education offered on hospice services in the home  Spoke with both step-daughters/Jeanete and Dub Mikes for update on treatment plan  Plan of Care: -Focus of care is comfort and dignity  -DNR/DNI           MOST form completed -no artifical feeding now or in the future, comfort feeds as tolerated  -no further diagnostics -palliative wound care -Symptom management       -Foley for end-of-life care       -Roxanol 5 mg sublingual/p.o. every 3 hours as needed for pain or shortness of breath. - discharge home with hospice services and transition to [place] a bed is available   Education offered on the natural trajectory and expectations at end of life.  Questions and concerns addressed   Discussed with Dr Louanne Belton and Carolynn Comment and hospice liaison/[facility]   Total time spent on the unit was 35 minutes   Greater than 50% of the time was spent in counseling and coordination of care  Wadie Lessen NP  Palliative Medicine Team Team Phone # [phone] Pager [phone]

## 2020-01-25 ENCOUNTER — Ambulatory Visit: Payer: HMO | Admitting: Nurse Practitioner

## 2020-01-25 MED ORDER — POLYETHYLENE GLYCOL 3350 17 G PO PACK: 17.0000 g | PACK | Freq: Every day | ORAL | Status: AC | PRN

## 2020-01-25 MED ORDER — MORPHINE SULFATE (CONCENTRATE) 10 MG/0.5ML PO SOLN: 5.0000 mg | ORAL | Status: AC | PRN

## 2020-01-25 MED ORDER — ONDANSETRON HCL 4 MG PO TABS: 4.0000 mg | ORAL_TABLET | Freq: Four times a day (QID) | ORAL | Status: AC | PRN

## 2020-01-25 NOTE — Discharge Summary (Signed)
Physician Discharge Summary  Kerry Barnes KDT:[id number] DOB: 06/26/1933 DOA: 01/15/2020  PCP: Aletha Halim., PA-C  Admit date: 01/15/2020 Discharge date: 01/25/2020  Admitted From: Home  Discharge disposition: residential hospice   Recommendations for Outpatient Follow-Up:   . Follow up with hospice care provider after discharge   Discharge Diagnosis:   Active Problems:   Type 2 diabetes with stage 3 chronic kidney disease GFR 30-59 (HCC)   Essential hypertension   Chronic diastolic heart failure (HCC)   Gastroparesis   Atrial flutter, paroxysmal (HCC)   Chronic renal insufficiency, stage III (moderate) (HCC)   Asthma   AKI (acute kidney injury) ([place])   Elevated LFTs   Acute lower UTI   Sacral decubitus ulcer   Sepsis ([place])   Anemia   Hypotension   Protein-calorie malnutrition, severe   Abnormal CT scan, colon   Adult failure to thrive   Palliative care by specialist   DNR (do not resuscitate)    Discharge Condition: Improved.  Diet recommendation: Low sodium, heart healthy.  Carbohydrate-modified.  Wound care: None.  Code status: DNR   History of Present Illness:   86 year old female with PMHx of CKD III, atrial fibrillation on Eliquis, chronic diastolic heart failure, hypertension, hyperlipidemia, and diabetes mellitus presented to the hospital for worsening weakness, FTT. She was noted to be hypotensive in the ED, admitted to ICU treated with IV fluids, Abx, blood pressure stabilized. CT abdomen pelvis noted lower lobe consolidation and changing caliber of the colon suggesting possible focal lesion.Transferred to [facility] service 8/4. Patient was noted to have severe hypoalbuminemia, unstagable sacral wound, UTI. GI was consulted.  Hospital Course:   Following conditions were addressed during hospitalization as listed below,  Severe failure to thrive, weight loss, debility, severe hypoalbuminemia, anemia, Severe malnutrition -Patient has had at  least 6 to 12 months of decline.  Patient underwent colonoscopy on 01/20/2020 with normal findings.   CT scan of the chest was unremarkable.  Right upper quadrant ultrasound noted some abnormal liver echotexture.  Hepatitis C was negative.  Ferritin more than 2000 unlikely to be hemochromatosis. TSH was unremarkable.  Overall prognosis of the patient is poor due to ongoing debility, bedbound status, severe malnutrition, sacral decubitus ulceration.  Palliative care was consulted and plan is hospice care.  Multifocal pneumonia/bilateral pleural effusions -CT on admission noted with bilateral lower lobe consolidation.  4 mm noncalcified lung nodule and radiology recommended 12 months CT scan follow-up.  Patient was treated with IV Levaquin and was subsequently changed to Rocephin for UTI and wound infection.  Blood cultures have been negative so far.  Urinary Legionella and pneumococcal antigen are negative.  No evidence of aspiration on swallow test.  On room air.  Klebsiella UTI/Recurrent urinary retention Blood cultures negative so far.  Still with leukocytosis.   Foley catheter placed 01/20/20 due to recurrent urinary retention, also has a necrotic sacral decubitus wound. Will continue for comfort.  Unstageable sacral decubitus wound -With black eschar , no abscess on CT scan of the pelvis.  Patient was seen by general surgery and with severe hypoalbuminemia and bedbound status, unlikely to heal with surgical intervention.  Surgery recommended local wound care.    Metabolic encephalopathy  Improved.  Likely secondary infection/medications.  Off gabapentin  Iron deficiency anemia -Received IV iron on 01/18/2020.  Colonoscopy on 01/20/2020 was unremarkable. Barium esophagram showed no gross esophageal abnormality.  Hypotension -Likely chronic.  Cultures negative.  On midodrine during hospitalization.    Paroxysmal  atrial fibrillation -Currently in sinus rhythm.  Eliquis was resumed after  colonoscopy.  Has been discontinued after goals of care discussion.  On amiodarone.    Acute on chronic diastolic CHF 2D Echo with diastolic dysfunction, preserved EF and moderate mitral regurgitation.  currently compensated.  Hypokalemia Replenished.  Latest potassium of 4.6.  Type 2 diabetes mellitus Latest hemoglobin A1c of 5.1.  received sliding scale insulin during hospitalization.    Ethics/goal of care discussion Patient with poor prognosis.   DNR status.  She has been accepted for residential hospice placement.  Disposition.  At this time, patient is stable for disposition to residential hospice  Medical Consultants:    GI  PCCM  Palliative care  Procedures:    Colonoscopy 01/20/2020 Foley catheter placed 01/20/20   Subjective:   Today, patient was seen and examined at bedside.  Denies overt pain, nausea vomiting shortness of breath.  Discharge Exam:   Vitals:   01/25/20 0452 01/25/20 0840  BP: (!) 99/45 (!) 100/46  Pulse: 63 66  Resp: 18 18  Temp: 99.5 F (37.5 C) 99 F (37.2 C)  SpO2: 97% 100%   Vitals:   01/24/20 0852 01/24/20 2012 01/25/20 0452 01/25/20 0840  BP: 99/60 (!) 91/46 (!) 99/45 (!) 100/46  Pulse: 89 69 63 66  Resp: 17 18 18 18   Temp: 98 F (36.7 C) 100.3 F (37.9 C) 99.5 F (37.5 C) 99 F (37.2 C)  TempSrc: Axillary Oral Oral Oral  SpO2: 97% 92% 97% 100%  Weight:      Height:       General: Alert awake, not in obvious distress, cachectic HENT: pupils equally reacting to light,  No scleral pallor or icterus noted. Oral mucosa is moist.  Chest:  Clear breath sounds.  Diminished breath sounds bilaterally. No crackles or wheezes.  CVS: S1 &S2 heard. No murmur.  Regular rate and rhythm. Abdomen: Soft, nontender, nondistended.  Bowel sounds are heard.   Extremities: No cyanosis, clubbing but trace edema peripheral pulses are palpable. Psych: Alert, awake and oriented, normal mood CNS:  No cranial nerve deficits.  Power equal in  all extremities.   Skin: Warm and dry.  Sacral decubitus ulceration present on admission  The results of significant diagnostics from this hospitalization (including imaging, microbiology, ancillary and laboratory) are listed below for reference.     Diagnostic Studies:   DG Chest 2 View  Result Date: 01/15/2020 CLINICAL DATA:  Ongoing weakness. EXAM: CHEST - 2 VIEW COMPARISON:  April 23, 2018 FINDINGS: Mild areas of atelectasis and/or infiltrate are seen within the bilateral lung bases, left greater than right. Very small bilateral pleural effusions are seen. No pneumothorax is identified. The cardiac silhouette is mildly enlarged. Mild calcification of the aortic arch is noted. Multilevel degenerative changes seen throughout the thoracic spine. Radiopaque surgical clips are seen within the right upper quadrant. IMPRESSION: 1. Mild bibasilar atelectasis and/or infiltrate, left greater than right. 2. Very small bilateral pleural effusions. Electronically Signed   By: Virgina Norfolk [name]   On: 01/15/2020 18:58   CT ABDOMEN PELVIS W CONTRAST  Result Date: 01/15/2020 CLINICAL DATA:  Unintended weight loss. Elevated liver function studies. Abnormal white cell count and lactic. EXAM: CT ABDOMEN AND PELVIS WITH CONTRAST TECHNIQUE: Multidetector CT imaging of the abdomen and pelvis was performed using the standard protocol following bolus administration of intravenous contrast. CONTRAST:  68mL OMNIPAQUE IOHEXOL 300 MG/ML  SOLN COMPARISON:  04/24/2018 FINDINGS: Lower chest: Consolidation in both lung bases.  No  effusions. Hepatobiliary: No focal liver abnormality is seen. Status post cholecystectomy. No biliary dilatation. Pancreas: Unremarkable. No pancreatic ductal dilatation or surrounding inflammatory changes. Spleen: Normal in size without focal abnormality. Adrenals/Urinary Tract: Adrenal glands are unremarkable. Kidneys are normal, without renal calculi, focal lesion, or hydronephrosis. Bladder is  unremarkable. Stomach/Bowel: Stomach is decompressed. Small bowel are decompressed. Scattered gas and stool in the colon with residual contrast material mostly in the descending and sigmoid colon. No colonic dilatation. Decompression of bowel limits evaluation of the bowel wall but there is suggestion of some small bowel and scattered colonic wall thickening. This may indicate enteritis. No abscess or mesenteric infiltration is identified. Although not overtly distended, there is a change in caliber of the colon at the level of the splenic flexure possibly indicating a focal lesion. Consider endoscopy if clinically indicated to exclude colon cancer. The appendix is normal. Vascular/Lymphatic: Prominent aortic calcification. No aneurysm. No significant lymphadenopathy. Reproductive: Uterus and bilateral adnexa are unremarkable. Other: No abdominal wall hernia or abnormality. No abdominopelvic ascites. Musculoskeletal: Degenerative changes in the spine. Lumbar scoliosis convex towards the right. No destructive bone lesions. IMPRESSION: 1. Consolidation in both lung bases. 2. Decompression of bowel limits evaluation of the bowel wall but there is suggestion of some small bowel and scattered colonic wall thickening. This may indicate enteritis. No abscess or mesenteric infiltration is identified. 3. Although not overtly distended, there is a change in caliber of the colon at the level of the splenic flexure possibly indicating a focal lesion. Consider endoscopy if clinically indicated to exclude colon cancer. 4. Aortic atherosclerosis. Aortic Atherosclerosis (ICD10-I70.0). Electronically Signed   By: Lucienne Capers [name]   On: 01/15/2020 23:46   ECHOCARDIOGRAM COMPLETE  Result Date: 01/16/2020    ECHOCARDIOGRAM REPORT   Patient Name:   GAO MITNICK Date of Exam: 01/16/2020 Medical Rec #:  [id number]          Height:       61.0 in Accession #:    [id number]         Weight:       103.0 lb Date of Birth:  Jan 05, 1934           BSA:          1.424 m Patient Age:    86 years           BP:           107/55 mmHg Patient Gender: F                  HR:           62 bpm. Exam Location:  Inpatient Procedure: 2D Echo, Cardiac Doppler and Color Doppler Indications:    Atrial fibrillation  History:        Patient has prior history of Echocardiogram examinations.                 Pulmonary HTN, Mitral Valve Disease, Arrythmias:Atrial Flutter;                 Risk Factors:Diabetes and Dyslipidemia. CKD.  Sonographer:    Clayton Lefort RDCS (AE) Referring Phys: [id number] [place]  1. Left ventricular ejection fraction, by estimation, is 60 to 65%. The left ventricle has normal function. The left ventricle has no regional wall motion abnormalities. Left ventricular diastolic function could not be evaluated.  2. Right ventricular systolic function is normal. The right ventricular size is normal. There is mildly elevated pulmonary artery  systolic pressure. The estimated right ventricular systolic pressure is 02.4 mmHg.  3. Left atrial size was moderately dilated.  4. The mitral valve is rheumatic. Mild to moderate mitral valve regurgitation. Mild mitral stenosis. The mean mitral valve gradient is 3.0 mmHg with average heart rate of 62 bpm.  5. The aortic valve is normal in structure. Aortic valve regurgitation is not visualized. No aortic stenosis is present.  6. The inferior vena cava is normal in size with greater than 50% respiratory variability, suggesting right atrial pressure of 3 mmHg. Comparison(s): Prior images unable to be directly viewed, comparison made by report only. FINDINGS  Left Ventricle: Left ventricular ejection fraction, by estimation, is 60 to 65%. The left ventricle has normal function. The left ventricle has no regional wall motion abnormalities. The left ventricular internal cavity size was normal in size. There is  no left ventricular hypertrophy. Left ventricular diastolic function could not be evaluated  due to mitral stenosis. Left ventricular diastolic function could not be evaluated. Right Ventricle: The right ventricular size is normal. No increase in right ventricular wall thickness. Right ventricular systolic function is normal. There is mildly elevated pulmonary artery systolic pressure. The tricuspid regurgitant velocity is 3.24  m/s, and with an assumed right atrial pressure of 3 mmHg, the estimated right ventricular systolic pressure is 09.7 mmHg. Left Atrium: Left atrial size was moderately dilated. Right Atrium: Right atrial size was normal in size. Pericardium: There is no evidence of pericardial effusion. Mitral Valve: The mitral valve is rheumatic. There is mild thickening of the mitral valve leaflet(s). Normal mobility of the mitral valve leaflets. Mild mitral annular calcification. Mild to moderate mitral valve regurgitation. Mild mitral valve stenosis. The mean mitral valve gradient is 3.0 mmHg with average heart rate of 62 bpm. Tricuspid Valve: The tricuspid valve is normal in structure. Tricuspid valve regurgitation is mild . No evidence of tricuspid stenosis. Aortic Valve: The aortic valve is normal in structure. Aortic valve regurgitation is not visualized. No aortic stenosis is present. Aortic valve mean gradient measures 5.0 mmHg. Aortic valve peak gradient measures 8.8 mmHg. Aortic valve area, by VTI measures 1.71 cm. Pulmonic Valve: The pulmonic valve was normal in structure. Pulmonic valve regurgitation is not visualized. No evidence of pulmonic stenosis. Aorta: The aortic root is normal in size and structure. Venous: The inferior vena cava is normal in size with greater than 50% respiratory variability, suggesting right atrial pressure of 3 mmHg. IAS/Shunts: No atrial level shunt detected by color flow Doppler.  LEFT VENTRICLE PLAX 2D LVIDd:         4.40 cm LVIDs:         2.80 cm LV PW:         1.10 cm LV IVS:        0.80 cm LVOT diam:     1.80 cm LV SV:         53 LV SV Index:   38 LVOT  Area:     2.54 cm  RIGHT VENTRICLE             IVC RV Basal diam:  3.20 cm     IVC diam: 1.80 cm RV S prime:     14.10 cm/s TAPSE (M-mode): 2.1 cm LEFT ATRIUM           Index       RIGHT ATRIUM           Index LA diam:      3.90 cm 2.74 cm/m  RA Area:     13.30 cm LA Vol (A2C): 55.0 ml 38.61 ml/m RA Volume:   31.80 ml  22.33 ml/m LA Vol (A4C): 69.1 ml 48.51 ml/m  AORTIC VALVE AV Area (Vmax):    1.81 cm AV Area (Vmean):   1.53 cm AV Area (VTI):     1.71 cm AV Vmax:           148.00 cm/s AV Vmean:          108.000 cm/s AV VTI:            0.312 m AV Peak Grad:      8.8 mmHg AV Mean Grad:      5.0 mmHg LVOT Vmax:         105.00 cm/s LVOT Vmean:        64.900 cm/s LVOT VTI:          0.210 m LVOT/AV VTI ratio: 0.67  AORTA Ao Root diam: 2.90 cm Ao Asc diam:  3.20 cm MITRAL VALVE                 TRICUSPID VALVE MV Area (PHT): 1.80 cm      TR Peak grad:   42.0 mmHg MV Mean grad:  3.0 mmHg      TR Vmax:        324.00 cm/s MV Decel Time: 421 msec MR Peak grad:    86.5 mmHg   SHUNTS MR Mean grad:    30.5 mmHg   Systemic VTI:  0.21 m MR Vmax:         465.00 cm/s Systemic Diam: 1.80 cm MR Vmean:        352.0 cm/s MR PISA:         1.01 cm MR PISA Eff ROA: 9 mm MR PISA Radius:  0.40 cm Mihai Croitoru MD Electronically signed by Sanda Klein MD Signature Date/Time: 01/16/2020/12:52:12 PM    Final      Labs:   Basic Metabolic Panel: Recent Labs  Lab 01/19/20 0753 01/19/20 0753 01/20/20 0220 01/20/20 0220 01/20/20 1136 01/20/20 1136 01/21/20 0300 01/21/20 0300 01/22/20 0250 01/23/20 0604  NA 138   < > 141  --  144  --  144  --  140 138  K 3.0*   < > 2.7*   < > 4.0   < > 2.7*   < > 3.5 4.6  CL 109   < > 109  --  115*  --  111  --  110 107  CO2 19*  --  19*  --   --   --  20*  --  21* 22  GLUCOSE 93   < > 146*  --  99  --  105*  --  135* 106*  BUN 17   < > 17  --  21  --  14  --  12 15  CREATININE 1.12*   < > 1.15*  --  0.90  --  1.03*  --  1.07* 0.96  CALCIUM 7.7*  --  7.8*  --   --   --  7.6*  --   7.6* 8.0*  MG  --   --  1.6*  --   --   --  1.6*  --   --   --    < > = values in this interval not displayed.   GFR Estimated Creatinine Clearance: 33.5 mL/min (by C-G formula based on SCr of 0.96 mg/dL). Liver Function Tests: Recent  Labs  Lab 01/19/20 0753 01/21/20 0300 01/22/20 0250 01/23/20 0604  AST 64* 68* 63* 58*  ALT 48* 43 38 38  ALKPHOS 123 114 105 136*  BILITOT 1.0 0.8 0.6 0.9  PROT 5.4* 4.9* 4.6* 5.2*  ALBUMIN 1.5* 1.4* 1.1* 1.2*   No results for input(s): LIPASE, AMYLASE in the last 168 hours. No results for input(s): AMMONIA in the last 168 hours. Coagulation profile No results for input(s): INR, PROTIME in the last 168 hours.  CBC: Recent Labs  Lab 01/19/20 0753 01/19/20 0753 01/20/20 0220 01/20/20 1136 01/21/20 0300 01/22/20 0250 01/23/20 0604  WBC 21.8*  --  21.2*  --  22.5* 18.5* 17.4*  HGB 8.4*   < > 8.0* 10.9* 7.7* 7.0* 7.5*  HCT 25.0*   < > 24.4* 32.0* 23.8* 21.6* 22.4*  MCV 79.6*  --  78.5*  --  79.9* 80.6 79.7*  PLT 285  --  300  --  280 280 289   < > = values in this interval not displayed.   Cardiac Enzymes: No results for input(s): CKTOTAL, CKMB, CKMBINDEX, TROPONINI in the last 168 hours. BNP: Invalid input(s): POCBNP CBG: Recent Labs  Lab 01/22/20 1657 01/22/20 2033 01/23/20 0004 01/23/20 0454 01/23/20 0733  GLUCAP 73 91 102* 103* 90   D-Dimer No results for input(s): DDIMER in the last 72 hours. Hgb A1c No results for input(s): HGBA1C in the last 72 hours. Lipid Profile No results for input(s): CHOL, HDL, LDLCALC, TRIG, CHOLHDL, LDLDIRECT in the last 72 hours. Thyroid function studies No results for input(s): TSH, T4TOTAL, T3FREE, THYROIDAB in the last 72 hours.  Invalid input(s): FREET3 Anemia work up No results for input(s): VITAMINB12, FOLATE, FERRITIN, TIBC, IRON, RETICCTPCT in the last 72 hours. Microbiology Recent Results (from the past 240 hour(s))  SARS Coronavirus 2 by RT PCR (hospital order, performed in [facility] hospital lab) Nasopharyngeal Nasopharyngeal Swab     Status: None   Collection Time: 01/15/20  7:00 PM   Specimen: Nasopharyngeal Swab  Result Value Ref Range Status   SARS Coronavirus 2 NEGATIVE NEGATIVE Final    Comment: (NOTE) SARS-CoV-2 target nucleic acids are NOT DETECTED.  The SARS-CoV-2 RNA is generally detectable in upper and lower respiratory specimens during the acute phase of infection. The lowest concentration of SARS-CoV-2 viral copies this assay can detect is 250 copies / mL. A negative result does not preclude SARS-CoV-2 infection and should not be used as the sole basis for treatment or other patient management decisions.  A negative result may occur with improper specimen collection / handling, submission of specimen other than nasopharyngeal swab, presence of viral mutation(s) within the areas targeted by this assay, and inadequate number of viral copies (<250 copies / mL). A negative result must be combined with clinical observations, patient history, and epidemiological information.  Fact Sheet for Patients:   StrictlyIdeas.no  Fact Sheet for Healthcare Providers: BankingDealers.co.za  This test is not yet approved or  cleared by the [place] FDA and has been authorized for detection and/or diagnosis of SARS-CoV-2 by FDA under an Emergency Use Authorization (EUA).  This EUA will remain in effect (meaning this test can be used) for the duration of the COVID-19 declaration under Section 564(b)(1) of the Act, 21 U.S.C. section 360bbb-3(b)(1), unless the authorization is terminated or revoked sooner.  Performed at [facility], [place] [address] [place], [place] [address]   Culture, blood (Routine x 2)     Status: None  Collection Time: 01/15/20  7:01 PM   Specimen: BLOOD  Result Value Ref Range Status   Specimen Description BLOOD SITE NOT SPECIFIED  Final   Special Requests   Final    BOTTLES  DRAWN AEROBIC AND ANAEROBIC Blood Culture adequate volume   Culture   Final    NO GROWTH 5 DAYS Performed at [facility], [address] [place], [place] [address]    Report Status 01/20/2020 FINAL  Final  Culture, blood (Routine x 2)     Status: None   Collection Time: 01/15/20  7:48 PM   Specimen: BLOOD RIGHT FOREARM  Result Value Ref Range Status   Specimen Description BLOOD RIGHT FOREARM  Final   Special Requests   Final    BOTTLES DRAWN AEROBIC ONLY Blood Culture results may not be optimal due to an inadequate volume of blood received in culture bottles   Culture   Final    NO GROWTH 5 DAYS Performed at [facility], [place] [address], [place], [place] [address]    Report Status 01/20/2020 FINAL  Final  Urine culture     Status: Abnormal   Collection Time: 01/15/20  7:55 PM   Specimen: Urine, Random  Result Value Ref Range Status   Specimen Description URINE, RANDOM  Final   Special Requests   Final    NONE Performed at [facility], [place] [address] [place], [place] [address]    Culture >=100,000 COLONIES/mL KLEBSIELLA PNEUMONIAE (A)  Final   Report Status 01/18/2020 FINAL  Final   Organism ID, Bacteria KLEBSIELLA PNEUMONIAE (A)  Final      Susceptibility   Klebsiella pneumoniae - MIC*    AMPICILLIN RESISTANT Resistant     CEFAZOLIN <=4 SENSITIVE Sensitive     CEFTRIAXONE <=0.25 SENSITIVE Sensitive     CIPROFLOXACIN <=0.25 SENSITIVE Sensitive     GENTAMICIN <=1 SENSITIVE Sensitive     IMIPENEM <=0.25 SENSITIVE Sensitive     NITROFURANTOIN 32 SENSITIVE Sensitive     TRIMETH/SULFA <=20 SENSITIVE Sensitive     AMPICILLIN/SULBACTAM <=2 SENSITIVE Sensitive     PIP/TAZO <=4 SENSITIVE Sensitive     * >=100,000 COLONIES/mL KLEBSIELLA PNEUMONIAE  MRSA PCR Screening     Status: None   Collection Time: 01/16/20  5:41 AM   Specimen: Nasal Mucosa; Nasopharyngeal  Result Value Ref Range Status   MRSA by PCR NEGATIVE NEGATIVE Final    Comment:        The  GeneXpert MRSA Assay (FDA approved for NASAL specimens only), is one component of a comprehensive MRSA colonization surveillance program. It is not intended to diagnose MRSA infection nor to guide or monitor treatment for MRSA infections. Performed at [facility], [place] [address], [place], [place] [address]   Culture, Urine     Status: None   Collection Time: 01/19/20  9:12 AM   Specimen: Urine, Random  Result Value Ref Range Status   Specimen Description URINE, RANDOM  Final   Special Requests NONE  Final   Culture   Final    NO GROWTH Performed at [facility], [place] [address], [place], [place] [address]    Report Status 01/20/2020 FINAL  Final  Culture, blood (routine x 2)     Status: None (Preliminary result)   Collection Time: 01/21/20  9:52 AM   Specimen: BLOOD  Result Value Ref Range Status   Specimen Description BLOOD RIGHT ANTECUBITAL  Final   Special Requests  Final    BOTTLES DRAWN AEROBIC AND ANAEROBIC Blood Culture adequate volume   Culture   Final    NO GROWTH 4 DAYS Performed at [facility], [place] [address], [place], [place] [address]    Report Status PENDING  Incomplete  Culture, blood (routine x 2)     Status: None (Preliminary result)   Collection Time: 01/21/20 10:02 AM   Specimen: BLOOD RIGHT HAND  Result Value Ref Range Status   Specimen Description BLOOD RIGHT HAND  Final   Special Requests   Final    BOTTLES DRAWN AEROBIC ONLY Blood Culture results may not be optimal due to an inadequate volume of blood received in culture bottles   Culture   Final    NO GROWTH 4 DAYS Performed at [facility], [place] [address] [place], [place] [address]    Report Status PENDING  Incomplete     Discharge Instructions:   Discharge Instructions    Diet general   Complete by: As directed    Discharge instructions   Complete by: As directed    Follow up as per hospice provider   Discharge wound care:   Complete by: As directed    For  comfort care     Allergies as of 01/25/2020      Reactions   Codeine Nausea Only   Metformin Diarrhea   Amoxicillin Rash   Biaxin [clarithromycin] Rash   Ciprofloxacin Rash   Penicillins Rash   Childhood allergic reaction Has patient had a PCN reaction causing immediate rash, facial/tongue/throat swelling, SOB or lightheadedness with hypotension: Yes Has patient had a PCN reaction causing severe rash involving mucus membranes or skin necrosis: No Has patient had a PCN reaction that required hospitalization: Yes Has patient had a PCN reaction occurring within the last 10 years: No If all of the above answers are "NO", then may proceed with Cephalosporin use.      Medication List    STOP taking these medications   atorvastatin 40 MG tablet Commonly known as: LIPITOR   Eliquis 2.5 MG Tabs tablet Generic drug: apixaban   ibandronate 150 MG tablet Commonly known as: BONIVA   Linzess 290 MCG Caps capsule Generic drug: linaclotide   methocarbamol 500 MG tablet Commonly known as: ROBAXIN     TAKE these medications   acetaminophen 325 MG tablet Commonly known as: TYLENOL Take 650 mg by mouth every 6 (six) hours as needed for moderate pain.   albuterol 108 (90 Base) MCG/ACT inhaler Commonly known as: VENTOLIN HFA Inhale 2 puffs into the lungs every 6 (six) hours as needed for wheezing or shortness of breath.   amiodarone 200 MG tablet Commonly known as: PACERONE TAKE 1 TABLET BY MOUTH TWICE DAILY   amLODipine 5 MG tablet Commonly known as: NORVASC Take 5 mg by mouth daily.   EPINEPHrine 0.3 mg/0.3 mL Soaj injection Commonly known as: EPI-PEN Inject 0.3 mg once as needed into the muscle (severe allergic reaction).   gabapentin 100 MG capsule Commonly known as: NEURONTIN Take 1 capsule (100 mg total) by mouth 3 (three) times daily.   mometasone 50 MCG/ACT nasal spray Commonly known as: NASONEX Place 2 sprays into the nose daily as needed (nasal congestion).     morphine CONCENTRATE 10 MG/0.5ML Soln concentrated solution Take 0.25 mLs (5 mg total) by mouth every 3 (three) hours as needed for moderate pain or shortness of breath.   ondansetron 4 MG tablet Commonly known as: ZOFRAN Take 1  tablet (4 mg total) by mouth every 6 (six) hours as needed for nausea or vomiting.   polyethylene glycol 17 g packet Commonly known as: MIRALAX / GLYCOLAX Take 17 g by mouth daily as needed for moderate constipation.   solifenacin 5 MG tablet Commonly known as: VESICARE Take 5 mg by mouth daily.   SYSTANE BALANCE OP Place 1 drop daily as needed into both eyes (dryness).   trimethoprim-polymyxin b ophthalmic solution Commonly known as: POLYTRIM Place 1 drop into the left eye 4 (four) times daily.            Discharge Care Instructions  (From admission, onward)         Start     Ordered   01/25/20 0000  Discharge wound care:       Comments: For comfort care   01/25/20 1330            Time coordinating discharge: 39 minutes  Signed:  Tomie Elko  [facility] 01/25/2020, 1:31 PM

## 2020-01-25 NOTE — TOC Transition Note (Signed)
Transition of Care [facility] - CM/SW Discharge Note *Discharged to [facility] via non-emergency ambulance   Patient Details  Name: Kerry Barnes MRN: [id number] Date of Birth: [birth year]  Transition of Care [facility]) CM/SW Contact:  Sable Feil, LCSW Phone Number: 01/25/2020, 3:53 PM   Clinical Narrative: Discharge disposition is [facility] residential hospice facility and patient will discharge today. Family met with Hospice representative to complete paperwork. MD signed DNR and completed d/c summary and order. Husband Claiborne Billings contacted [phone]) and advised that non-emergency transport would be arranged to take patient to [facility]. Nurse provided with phone number to call report.      Final next level of care: [place] ([place]) Barriers to Discharge: Barriers Resolved   Patient Goals and CMS Choice Patient states their goals for this hospitalization and ongoing recovery are:: for her to get stronger; figure out what is going on with her legs CMS Medicare.gov Compare Post Acute Care list provided to::  (Not needed as family chose only [facility] facility) Choice offered to / list presented to : Spouse  Discharge Placement              Patient chooses bed at:  [facility])   Name of family member notified: Husband Claiborne Billings contacted to notifiy him regarding ambulance transport Patient and family notified of of transfer: 01/25/20  Discharge Plan and Services In-house Referral: Clinical Social Work Discharge Planning Services: CM Consult Post Acute Care Choice: [facility], Museum/gallery conservator                             Social Determinants of Health (SDOH) Interventions  No SDOH interventions requested or needed at discharge   Readmission Risk Interventions Readmission Risk Prevention Plan 01/20/2020  Transportation Screening Complete  PCP or Specialist Appt within 5-7 Days Not  Complete  Not Complete comments disposition pending  Home Care Screening Complete  Medication Review (RN CM) Referral to Pharmacy  Some recent data might be hidden

## 2020-01-25 NOTE — Progress Notes (Signed)
Manufacturing engineer [facility]) Hospital Liaison note.    [facility] is able to offer a bed to Kerry Barnes today. Spoke with Kerry Barnes and daughter Kerry Barnes to confirm interest and explain services. Family agreeable to transfer today. TOC aware.    [facility] will notify TOC when registration paperwork has been completed to arrange transport.   RN please call report to [phone]. Please be sure that DNR transports with patient.  Thank you for the opportunity to participate in this patients care.  Chrislyn Edison Pace, BSN, RN [place] (listed on [place] under [facility])    [phone]

## 2020-01-25 NOTE — Plan of Care (Signed)
  Problem: Safety: Goal: Ability to remain free from injury will improve Outcome: Progressing   

## 2020-01-25 NOTE — Progress Notes (Signed)
DISCHARGE NOTE SNF Kerry Barnes to be discharged Nursing Home per MD order. Patient verbalized understanding.  Skin clean, dry and intact without evidence of skin break down, no evidence of skin tears noted. IV catheter discontinued intact. Site without signs and symptoms of complications. Dressing and pressure applied. Pt denies pain at the site currently. No complaints noted.  Patient free of lines, drains, and wounds.   Discharge packet assembled. An After Visit Summary (AVS) was printed and given to the EMS personnel. Patient escorted via stretcher and discharged to [facility] via ambulance. Report called to accepting facility; all questions and concerns addressed.   Dorthey Sawyer, RN

## 2020-01-25 NOTE — Consult Note (Signed)
   [facility] CM Inpatient Consult   01/25/2020  SHEILYN BOEHLKE July 29, 1933 [id number]  [employer] CSNP member  Patient is currently active with [facility]  RN for chronic disease management services. Chart review reveals patient is for Hospice care at [facility]. Patient will receive a post hospital call and will be evaluated for assessments and disease process education.    Plan: Will update [facility] CSNP CCM of current transition of care.   Of note, [facility] services does not replace or interfere with any services that are needed or arranged by inpatient [facility] care management team.  For additional questions or referrals please contact:  Natividad Brood, RN BSN [facility] Liaison  [phone] business mobile phone Toll free office [phone]  Fax number: [phone] [email or web address]

## 2020-01-26 ENCOUNTER — Other Ambulatory Visit: Payer: Self-pay

## 2020-01-26 LAB — CULTURE, BLOOD (ROUTINE X 2)
Culture: NO GROWTH
Culture: NO GROWTH
Special Requests: ADEQUATE

## 2020-01-26 NOTE — Patient Outreach (Addendum)
  [facility] Care Management Chronic Special Needs Program    01/26/2020  Name: Kerry Barnes, DOB: [birth year]  MRN: [id number]   Kerry Barnes is enrolled in a chronic special needs plan. RNCM received notification from hospital liaison that client has [facility]   Plan: continue to follow. Care coordinate as needed.  Thea Silversmith, RN, MSN, [place] [phone]

## 2020-02-07 ENCOUNTER — Ambulatory Visit: Payer: Self-pay

## 2020-02-12 ENCOUNTER — Other Ambulatory Visit: Payer: Self-pay

## 2020-02-12 NOTE — Patient Outreach (Signed)
  [place] [facility]) Care Management Chronic Special Needs Program    02/12/2020  Name: STASSI FADELY, DOB: February 26, 1934  MRN: [id number]   RNCM received information that client is deceased-February 03, 2020.  Plan: case closed.  Thea Silversmith, RN, MSN, [place] [phone]

## 2020-02-13 ENCOUNTER — Ambulatory Visit: Payer: HMO

## 2020-02-14 DEATH — deceased

## 2020-03-18 ENCOUNTER — Other Ambulatory Visit: Payer: HMO

## 2020-03-23 IMAGING — CT CT ANGIO CHEST-ABD-PELV FOR DISSECTION W/ AND WO/W CM
2 of 7 series · 12 of 46 positions shown, 14 images · IV contrast (OMNI 350)
Comparison: Chest radiograph 01/31/2016. Abdomen and pelvis CT,
08/11/2017.

Addendum:
CLINICAL DATA: Chest pressure and pain beginning early this
morning. Dyspnea on exertion. Some nausea and vomiting.

EXAM:
CT ANGIOGRAPHY CHEST, ABDOMEN AND PELVIS
TECHNIQUE: Multidetector CT imaging through the chest, abdomen and pelvis was
performed using the standard protocol during bolus administration of
intravenous contrast. Multiplanar reconstructed images and MIPs were
obtained and reviewed to evaluate the vascular anatomy.
CONTRAST:  100mL S5CRHU-M1C IOPAMIDOL (S5CRHU-M1C) INJECTION 76%

[Series 8: dissection 2mm · axial · 0.71mm/px · z∈[+1008,+1520]mm · 9 of 305 slices shown, 11 images]
[im 33/305  soft-tissue]
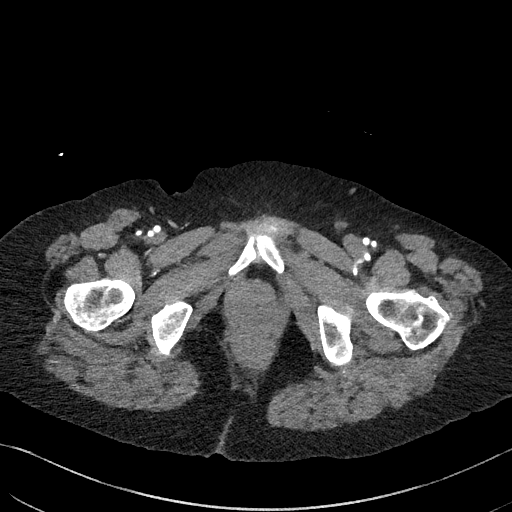
[im 33/305  bone]
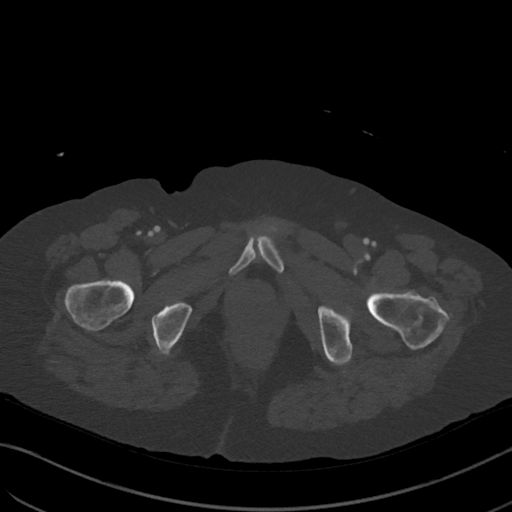
[im 65/305  soft-tissue]
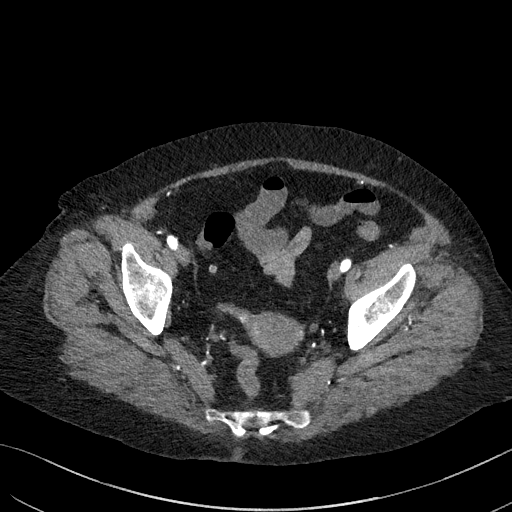
[im 97/305  soft-tissue]
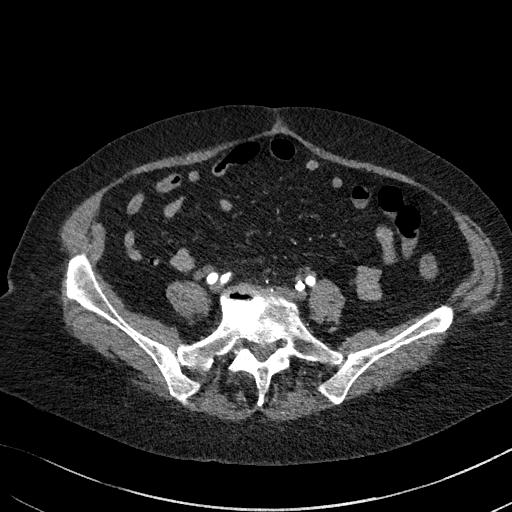
[im 129/305  soft-tissue]
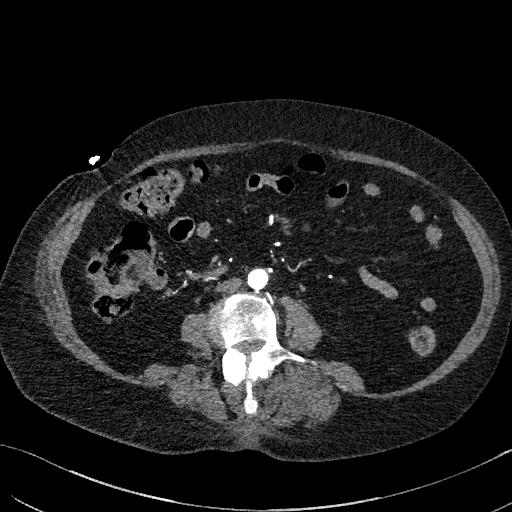
[im 161/305  soft-tissue]
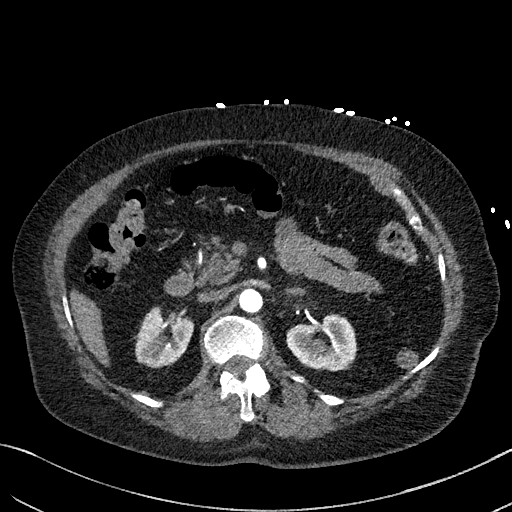
[im 193/305  soft-tissue]
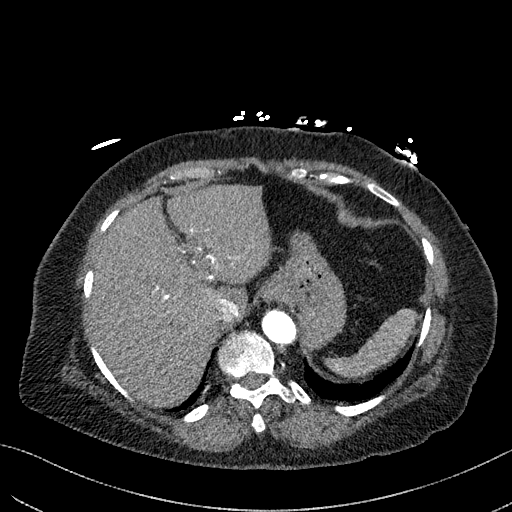
[im 225/305  soft-tissue]
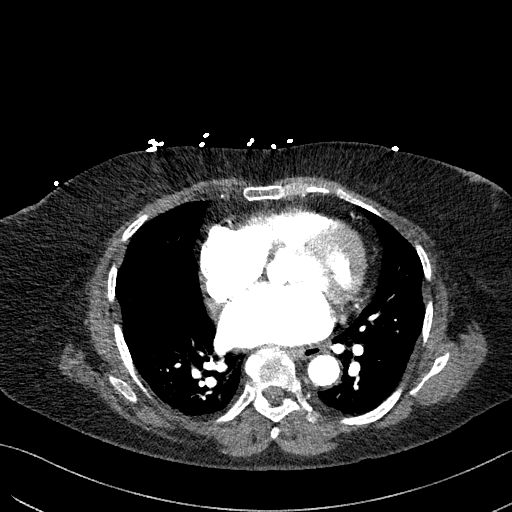
[im 257/305  soft-tissue]
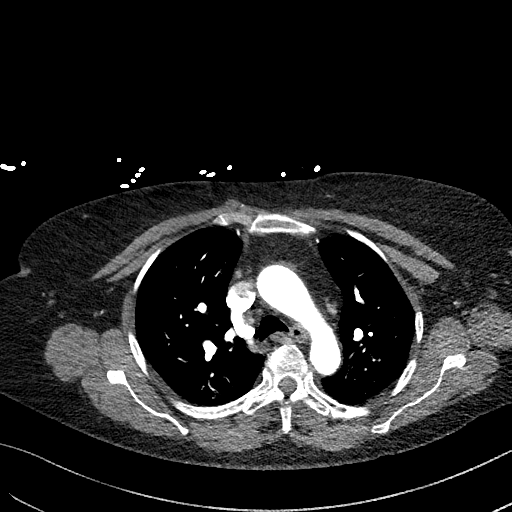
[im 289/305  soft-tissue]
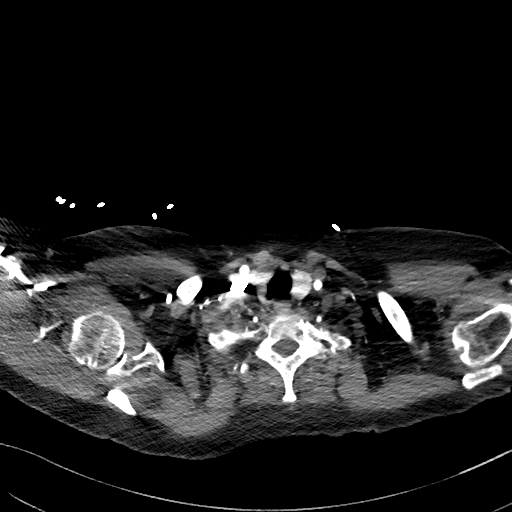
[im 289/305  bone]
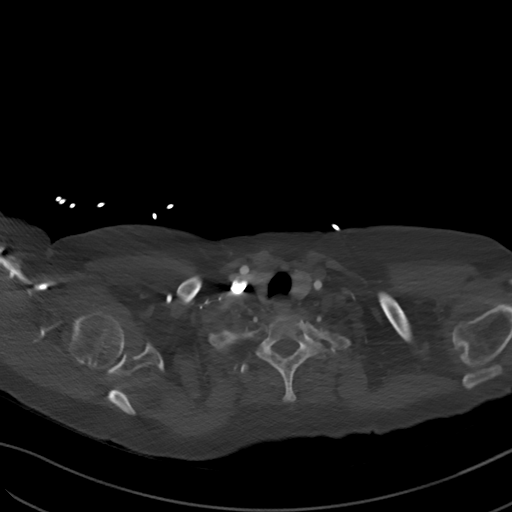

[Series 11: dissection 2mm cor · coronal · 0.69mm/px · 3 of 132 slices shown]
[im 33/132  soft-tissue]
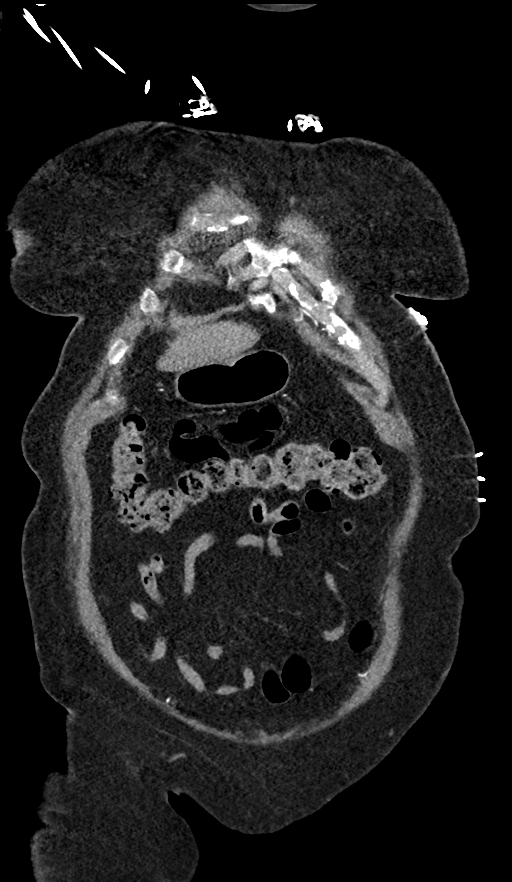
[im 66/132  soft-tissue]
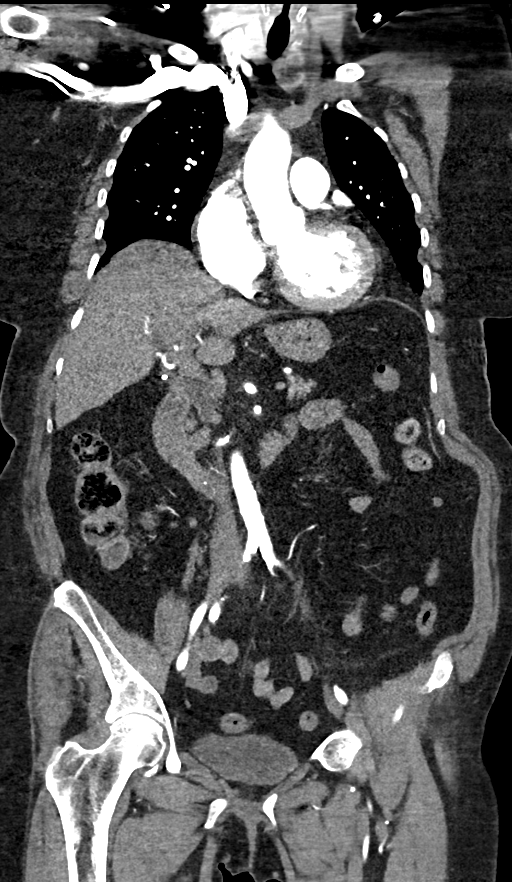
[im 99/132  soft-tissue]
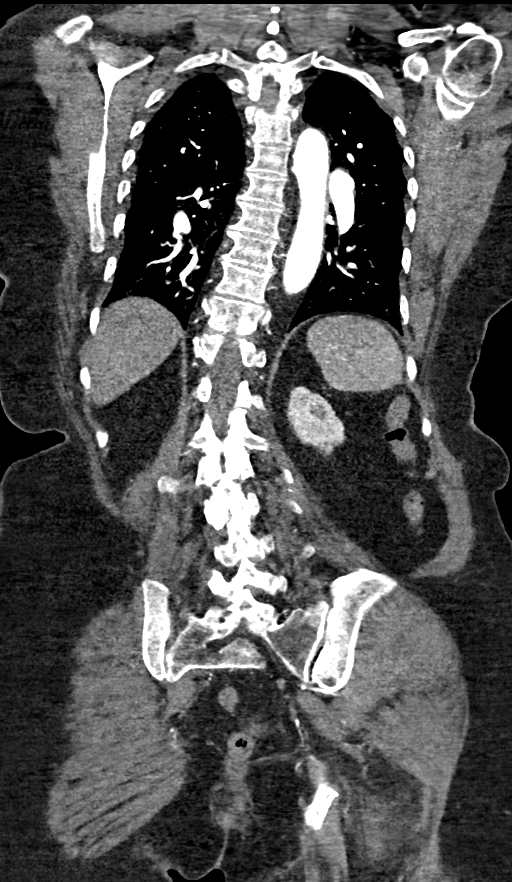

[12 of 46 positions shown; findings below may reference images not displayed]

FINDINGS: CTA CHEST FINDINGS

Cardiovascular: Heart is mildly enlarged. No pericardial effusion.
Mild left coronary artery calcifications. Great vessels normal in
caliber. No aortic dissection. No atherosclerosis. Aortic arch
branch vessels are widely patent. No evidence of a pulmonary
embolism.

Mediastinum/Nodes: 2.4 cm hypoattenuating left thyroid nodule. No
neck base or axillary masses or enlarged lymph nodes. No mediastinal
or hilar masses or adenopathy. Trachea and esophagus are
unremarkable.

Lungs/Pleura: Mild bilateral dependent reticular and hazy
peribronchovascular opacities, most likely atelectasis. Study
obtained with low lung volumes/expiration. No convincing pneumonia
or pulmonary edema. No lung mass or nodule. No pleural effusion or
pneumothorax.

Musculoskeletal: No fracture or acute finding. No osteoblastic or
osteolytic lesions.

Review of the MIP images confirms the above findings.

CTA ABDOMEN AND PELVIS FINDINGS

VASCULAR

Aorta: Aorta is normal in caliber. No dissection. Mild
atherosclerosis. No significant stenosis.

Celiac: Mild plaque at the origin. No significant stenosis. No
dissection, aneurysm or vasculitis.

SMA: Mild plaque at the origin. No significant stenosis. No
dissection, aneurysm or vasculitis.

Renals: Single right and 2 left renal arteries. Mild plaque at the
origin of the right renal artery without significant stenosis. No
dissection, vasculitis, aneurysm or fibromuscular dysplasia.

IMA: Minor plaque at the origin without significant stenosis.

Inflow: Bilateral common iliac atherosclerotic plaque without
significant stenosis. External iliac arteries are widely patent.

Veins: No obvious venous abnormality within the limitations of this
arterial phase study.

Review of the MIP images confirms the above findings.

NON-VASCULAR

Hepatobiliary: Liver normal in overall size. There is central volume
loss with some anterior surface nodularity, findings raising the
possibility of cirrhosis. No liver mass or focal lesion. Gallbladder
surgically absent. Common bile duct dilated to 1 cm proximally with
normal distal tapering. This is presumed chronic.

Pancreas: Unremarkable. No pancreatic ductal dilatation or
surrounding inflammatory changes.

Spleen: Normal in size without focal abnormality.

Adrenals/Urinary Tract: No adrenal masses.

Bilateral renal cortical thinning with reduced renal sizes, right
8.4 cm in length, left 8.8 cm in length. 8 mm low-attenuation lesion
in the posterior midpole, left kidney, likely a cyst. No other renal
masses or lesions, no stones and no hydronephrosis. Ureters are
normal in course and in caliber. Bladder is unremarkable.

Stomach/Bowel: Stomach is within normal limits. Appendix appears
normal. No evidence of bowel wall thickening, distention, or
inflammatory changes.

Lymphatic: No adenopathy.

Reproductive: Uterus and bilateral adnexa are unremarkable.

Other: No abdominal wall hernia or abnormality. No abdominopelvic
ascites.

Musculoskeletal: No fracture or acute finding. No osteoblastic or
osteolytic lesions. There are degenerative changes noted throughout
the thoracolumbar spine with an S-shaped scoliosis.

Review of the MIP images confirms the above findings.
IMPRESSION: CTA FINDINGS

1. No aortic aneurysm or dissection.  No acute findings.
2. Atherosclerotic changes along the abdominal aorta. No significant
stenosis. Branch vessels are widely patent.

NON CTA FINDINGS

1. No acute findings in the chest, abdomen or pelvis.
2. Mild cardiomegaly.  Mild left coronary artery calcifications.
3. Lungs show mild dependent opacity that is most likely
atelectasis.
4. Morphologic changes of the liver raise the possibility of
cirrhosis.
5. Bilateral renal cortical thinning with read Tues renal sizes
consistent with medical renal disease.

ADDENDUM:
There is also 2.4 cm hypoattenuating left thyroid nodule, an
incidental finding of potential clinical significance. Consider
further evaluation with thyroid ultrasound. If patient is clinically
hyperthyroid, consider nuclear medicine thyroid uptake and scan.

*** End of Addendum ***

## 2020-03-23 IMAGING — CR DG CHEST 2V
2 series · 2 of 2 positions shown · non-contrast
Comparison: Chest CT 04/23/2018

CLINICAL DATA: Chest pressure and pain starting at 5 a.m..

EXAM:
CHEST - 2 VIEW

[chest lat]
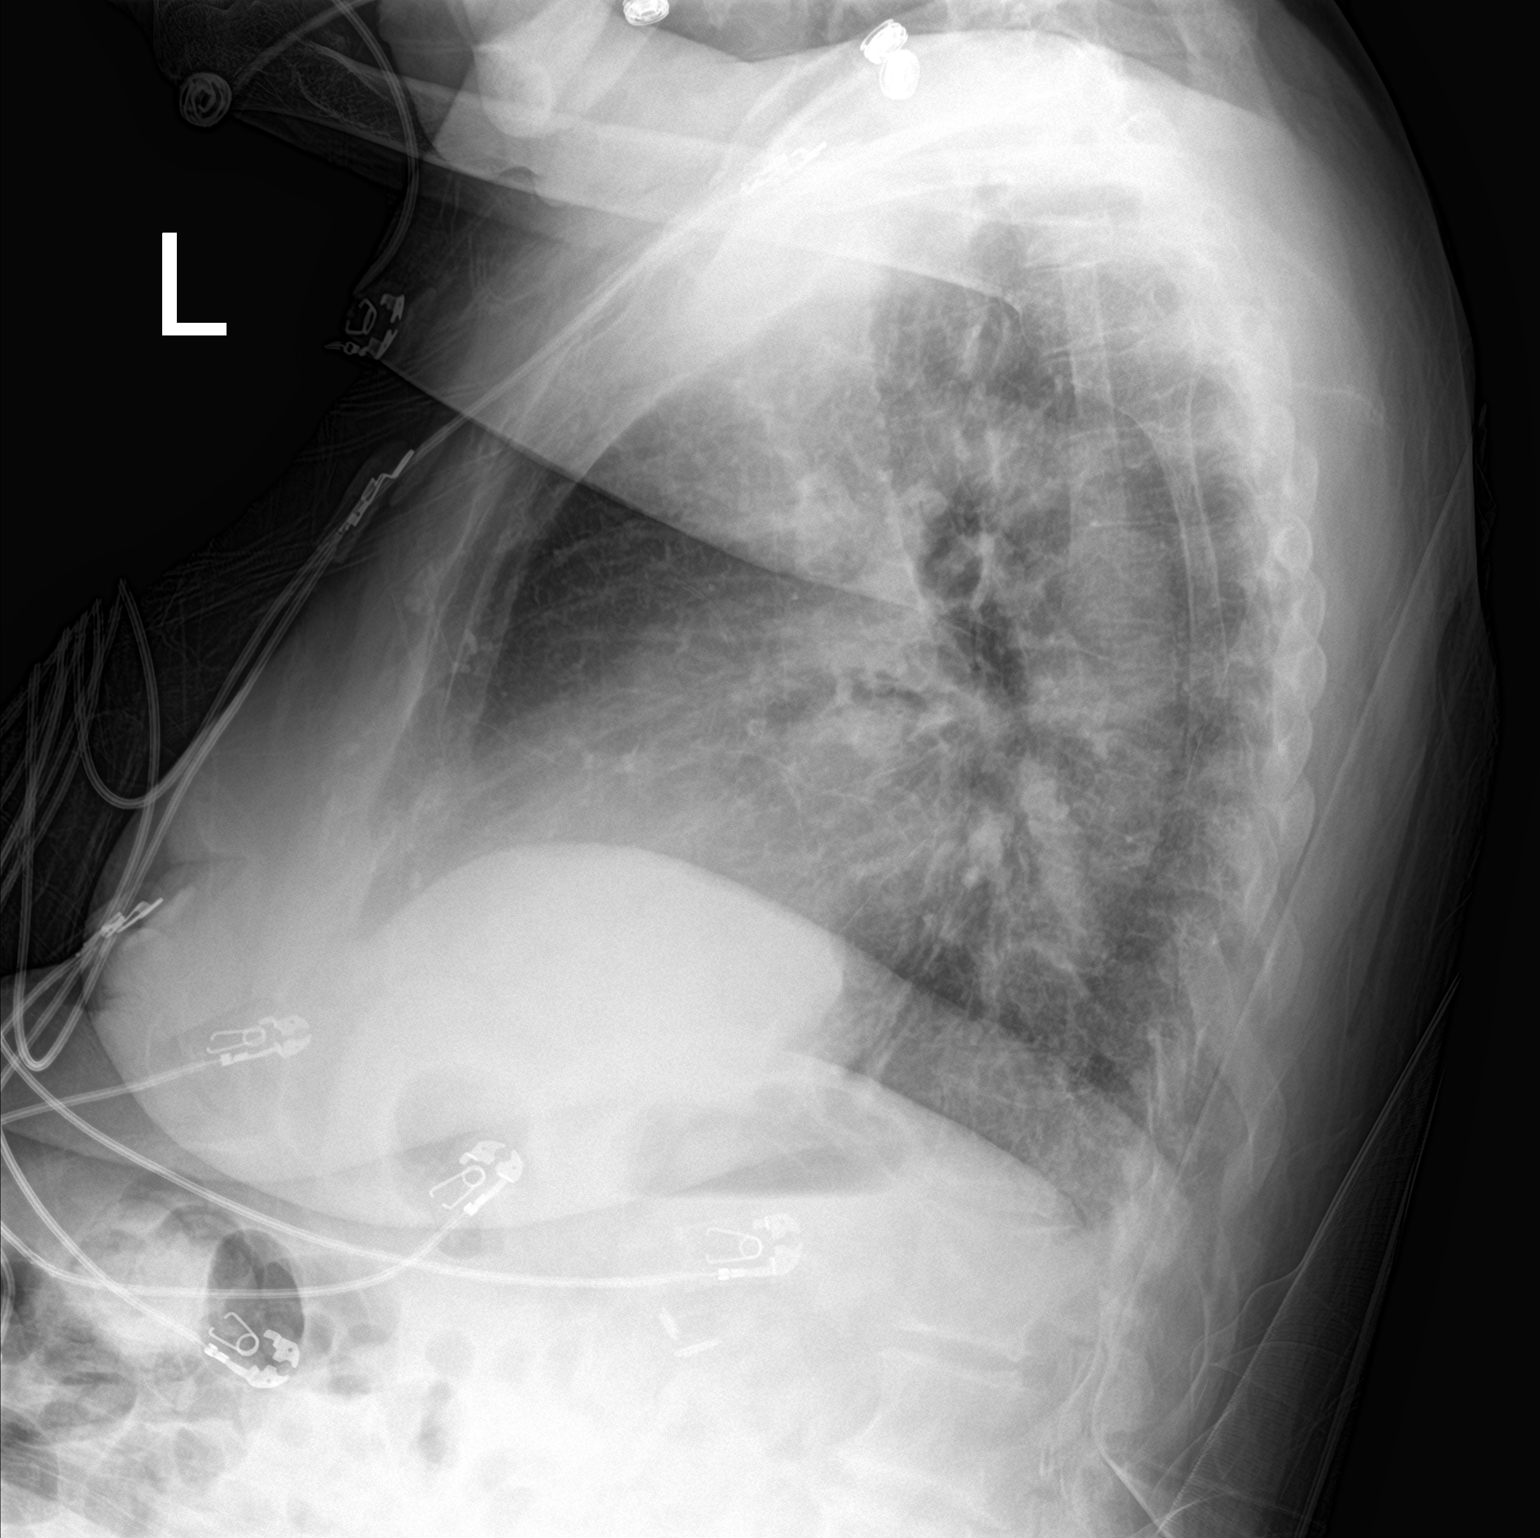

[chest ap]
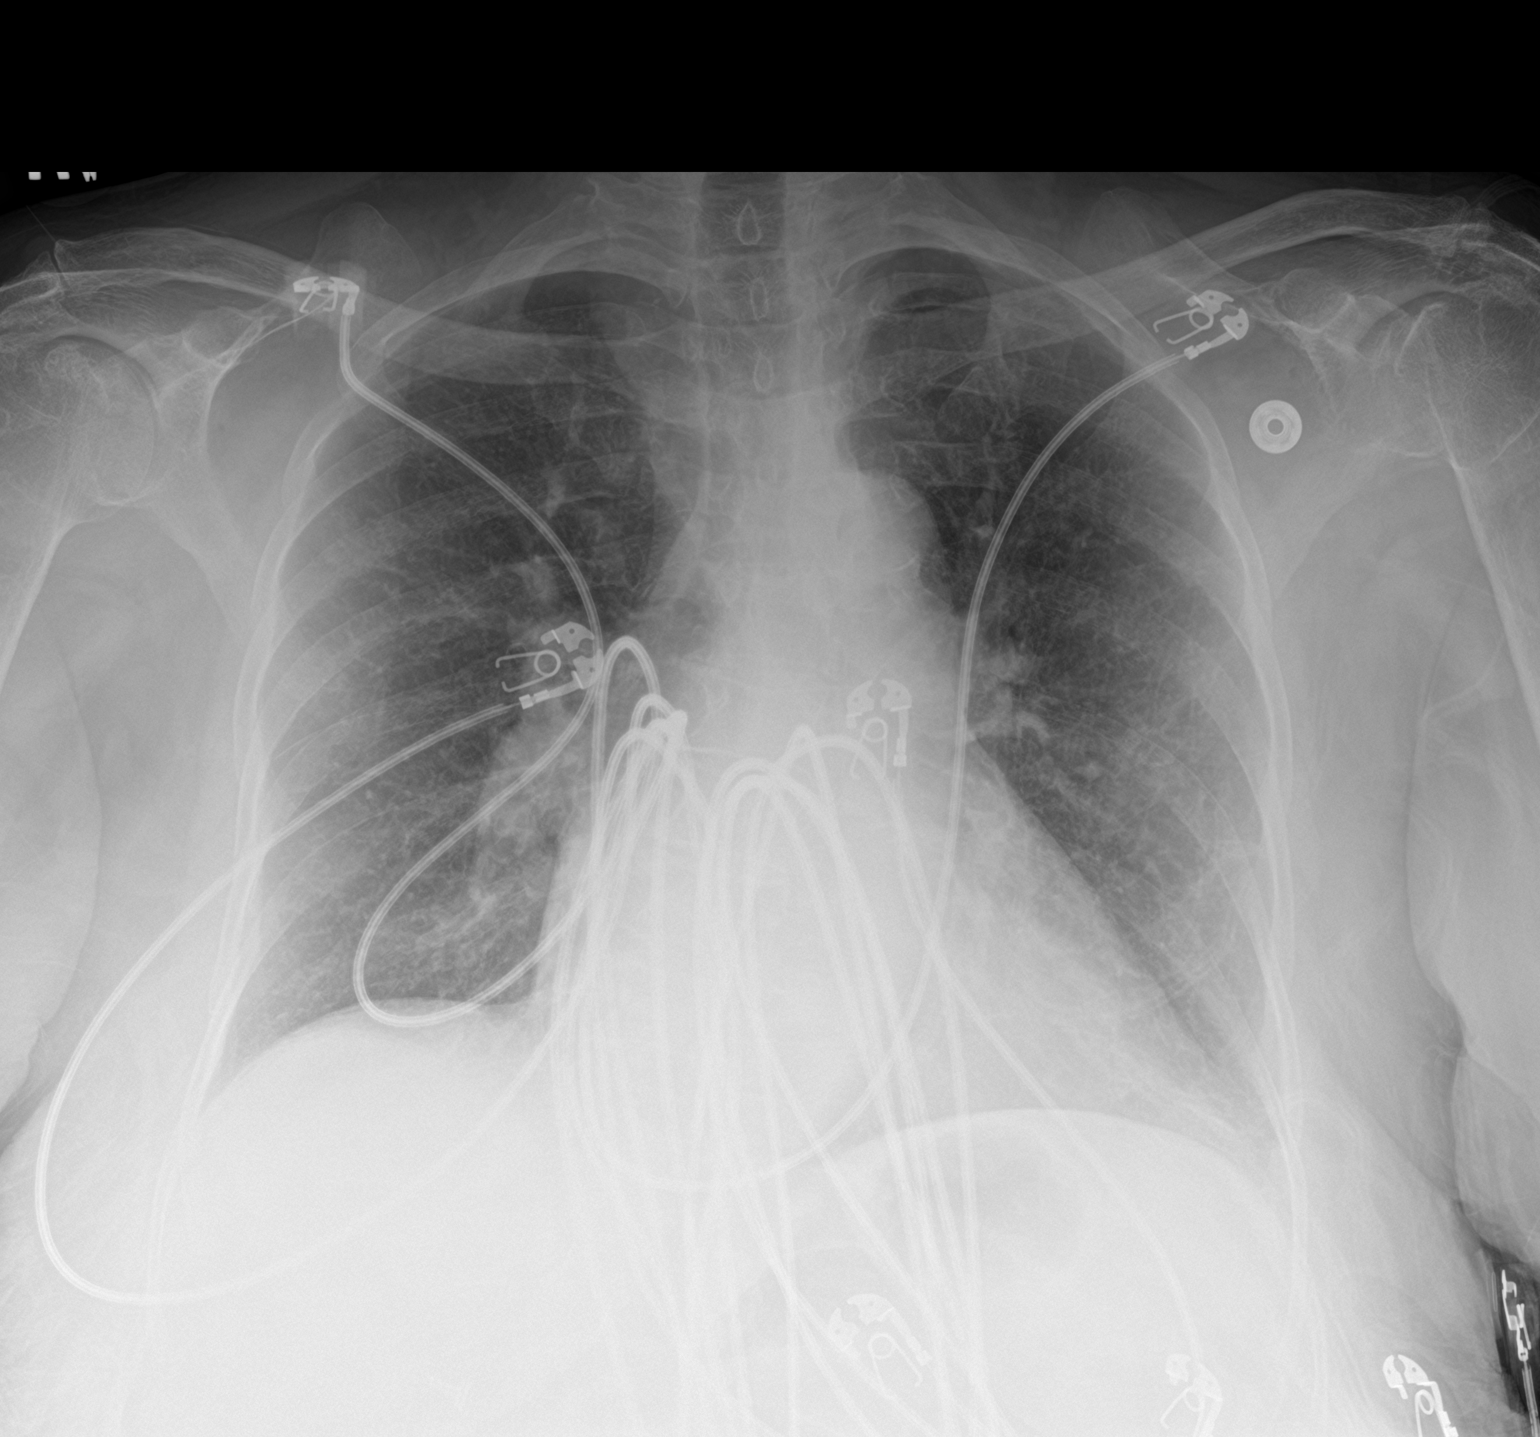

[2 of 2 positions shown; findings below may reference images not displayed]

FINDINGS: Mild enlargement of the cardiopericardial silhouette without edema.
Linear subsegmental atelectasis at the left lung base.
Atherosclerotic calcification of the aortic arch. Mild dextroconvex
lower thoracic scoliosis. No pleural effusion.
IMPRESSION: 1. Mild enlargement of the cardiopericardial silhouette, without
edema.
2. Mild left basilar subsegmental atelectasis or scarring.
3. Dextroconvex lower thoracic scoliosis.
4.  Aortic Atherosclerosis ([id number]).

## 2020-03-28 ENCOUNTER — Ambulatory Visit: Payer: HMO | Admitting: Cardiology
